# Patient Record
Sex: Male | Born: 1950 | Race: White | Hispanic: No | Marital: Single | Smoking: Former smoker
Health system: Southern US, Community
[De-identification: ages and names within clinical notes are randomized; demographics above are authoritative.]

## PROBLEM LIST (undated history)

## (undated) ENCOUNTER — Ambulatory Visit (HOSPITAL_COMMUNITY): Admission: EM | Source: Home / Self Care

## (undated) DIAGNOSIS — C801 Malignant (primary) neoplasm, unspecified: Secondary | ICD-10-CM

## (undated) DIAGNOSIS — E119 Type 2 diabetes mellitus without complications: Secondary | ICD-10-CM

## (undated) HISTORY — PX: OTHER SURGICAL HISTORY: SHX169

## (undated) HISTORY — PX: BLADDER SURGERY: SHX569

---

## 2004-11-13 ENCOUNTER — Ambulatory Visit: Payer: Self-pay | Admitting: Gastroenterology

## 2004-11-26 ENCOUNTER — Ambulatory Visit: Payer: Self-pay | Admitting: Urology

## 2006-09-06 ENCOUNTER — Ambulatory Visit: Payer: Self-pay | Admitting: Internal Medicine

## 2007-01-03 ENCOUNTER — Ambulatory Visit: Payer: Self-pay | Admitting: Internal Medicine

## 2007-09-28 ENCOUNTER — Ambulatory Visit: Payer: Self-pay | Admitting: Internal Medicine

## 2007-09-30 ENCOUNTER — Ambulatory Visit: Payer: Self-pay | Admitting: Internal Medicine

## 2007-10-30 ENCOUNTER — Ambulatory Visit: Payer: Self-pay | Admitting: Internal Medicine

## 2007-11-30 ENCOUNTER — Ambulatory Visit: Payer: Self-pay | Admitting: Internal Medicine

## 2011-04-08 ENCOUNTER — Ambulatory Visit: Payer: Self-pay | Admitting: Internal Medicine

## 2011-09-10 ENCOUNTER — Encounter: Payer: Self-pay | Admitting: Rheumatology

## 2014-05-15 ENCOUNTER — Ambulatory Visit: Payer: Self-pay | Admitting: Urology

## 2014-05-15 LAB — BASIC METABOLIC PANEL
Anion Gap: 6 — ABNORMAL LOW (ref 7–16)
BUN: 15 mg/dL (ref 7–18)
CO2: 28 mmol/L (ref 21–32)
Calcium, Total: 8.4 mg/dL — ABNORMAL LOW (ref 8.5–10.1)
Chloride: 101 mmol/L (ref 98–107)
Creatinine: 1.17 mg/dL (ref 0.60–1.30)
EGFR (African American): >60
Glucose: 268 mg/dL — ABNORMAL HIGH (ref 65–99)
OSMOLALITY: 280 (ref 275–301)
POTASSIUM: 4.1 mmol/L (ref 3.5–5.1)
Sodium: 135 mmol/L — ABNORMAL LOW (ref 136–145)

## 2014-05-21 ENCOUNTER — Ambulatory Visit: Payer: Self-pay | Admitting: Urology

## 2014-05-23 LAB — PATHOLOGY REPORT

## 2015-03-22 NOTE — Op Note (Signed)
PATIENT NAME:  Adam Chung, Adam Chung MR#:  [id number] DATE OF BIRTH:  10-14-1951  DATE OF PROCEDURE:  05/21/2014  PRINCIPAL DIAGNOSIS: Bladder tumor.   POSTOPERATIVE DIAGNOSIS: Bladder tumor.   PROCEDURE: Cystoscopy, bladder biopsy, left ureteroscopy, mitomycin bladder instillation, cystoscopy, left ureteral stent placement.   SURGEON: Edrick Oh, M.D.   ANESTHESIA: Laryngeal mask airway anesthesia.   INDICATIONS: The patient is a 64 year old white gentleman with a history of bladder cancer. 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 Due to concerns over possible ureteral extension, the decision was made to proceed with ureteroscopic examination at the same setting. He presents today for this purpose.   DESCRIPTION OF PROCEDURE: After informed consent was obtained, the patient was taken to the Operating Room and placed in the dorsal lithotomy position under laryngeal mask airway anesthesia. The patient was then prepped and draped in the usual standard fashion. The 22-French rigid cystoscope was introduced into the urethra under direct vision with no urethral abnormalities noted. Upon entering the prostatic fossa, moderate bilobar hypertrophy was noted with partial visual obstruction. Upon entering the bladder, the mucosa was inspected in its entirety with no gross mucosal lesions noted. An area of scar was noted on the left posterior bladder wall just behind the trigone region. At the level of the left trigone and ureteral orifice area, raised irregular early papillary-appearing mucosa was noted extending in patches onto the area of scar just behind the trigone and on both sides of the trigone at the level of the ureteral orifice it completely encircled the ureteral orifice proper. It extended only a short distance distal to the ureteral orifice. No  other irregular areas were noted on the right trigone region or other portions of the bladder. Cold cup biopsy forceps were then utilized through the cystoscope.  A biopsy was obtained from the lateral aspect of the trigone region at the level of the ureteral orifice. Care was taken to avoid any injury to the ureteral orifice with the biopsy. A good biopsy specimen was obtained. Minimal bleeding was encountered. The edges were lightly cauterized utilizing a Bugbee electrode. Care was taken to avoid injury to the ureteral orifice proper.   A guidewire was then inserted into the left ureteral orifice. An open-ended catheter was utilized to help facilitate navigation with the ureteral orifice intubated. The cystoscope was removed. A 6-French rigid ureteroscope was advanced into the urinary bladder. A second guidewire was utilized to help facilitate passage of the ureteroscope into the left ureteral orifice. There was no evidence of tumor inside of the orifice proper. The distal 3 to 4 cm of the ureter was inspected with no abnormalities appreciated. A slight area of narrowing was encountered. The scope was not able to be easily passed. The ureter could be visualized, however, beyond this area with no evidence of tumor or other significant abnormalities noted. The decision was made not to proceed with any further attempts at dilation. The initial plan was to try and avoid stent placement with the lack of tumor within the orifice and initial plan of possible transurethral resection of the trigone was therefore aborted. The Bugbee electrode was then utilized to fulgurate the abnormal-appearing lesions on the remainder of the posterior trigone approaching the ureteral orifice. The medial aspect of the trigone was also cauterized extensively utilizing a Bugbee electrode. Some distal tissue was also cauterized. A small amount of papillary tumor was still present  at the ureteral orifice proper. With the guidewire in place,  the Bugbee electrode was utilized to lightly fulgurate the tissue just to the edge of the ureteral orifice.   With the fulguration in close proximity to the opening, the decision was made to proceed with stent placement. The guidewire was backloaded over the cystoscope. A 6 French x 26 cm double-J ureteral stent was advanced over the guidewire into the upper pole collecting system under fluoroscopic guidance without difficulty. Adequate curl was noted within the renal pelvis as well as within the urinary bladder. Upon withdrawal of the guidewire, the bladder was drained. The cystoscope was removed.  Mitomycin 20 mg was reconstituted in 50 mL of sterile water. An 18-French red rubber catheter was inserted into the urinary bladder. The mitomycin was instilled into the urinary bladder. The catheter was then removed. Proper chemotherapy protocols were followed. The patient was then awakened from laryngeal mask airway anesthesia. He was taken to the recovery room in stable condition. There were no problems or complications. The patient tolerated the procedure well.   ____________________________ Denice Bors. Jacqlyn Larsen, MD bsc:cs D: 05/21/2014 14:09:54 ET T: 05/21/2014 19:31:49 ET JOB#: [id number]  cc: Denice Bors. Jacqlyn Larsen, MD, <Dictator> Denice Bors COPE MD ELECTRONICALLY SIGNED 05/22/2014 8:37

## 2018-07-28 ENCOUNTER — Emergency Department: Payer: Medicare Other

## 2018-07-28 ENCOUNTER — Emergency Department
Admission: EM | Admit: 2018-07-28 | Discharge: 2018-07-28 | Disposition: A | Discharge status: Home / Self Care | Payer: Medicare Other | Attending: Emergency Medicine | Admitting: Emergency Medicine

## 2018-07-28 DIAGNOSIS — M5412 Radiculopathy, cervical region: Secondary | ICD-10-CM

## 2018-07-28 DIAGNOSIS — Z87891 Personal history of nicotine dependence: Secondary | ICD-10-CM | POA: Insufficient documentation

## 2018-07-28 DIAGNOSIS — Z8551 Personal history of malignant neoplasm of bladder: Secondary | ICD-10-CM | POA: Insufficient documentation

## 2018-07-28 DIAGNOSIS — R0789 Other chest pain: Secondary | ICD-10-CM

## 2018-07-28 DIAGNOSIS — M549 Dorsalgia, unspecified: Secondary | ICD-10-CM | POA: Diagnosis present

## 2018-07-28 DIAGNOSIS — E119 Type 2 diabetes mellitus without complications: Secondary | ICD-10-CM | POA: Diagnosis not present

## 2018-07-28 HISTORY — DX: Malignant (primary) neoplasm, unspecified: C80.1

## 2018-07-28 HISTORY — DX: Type 2 diabetes mellitus without complications: E11.9

## 2018-07-28 LAB — CBC
HEMATOCRIT: 44.9 % (ref 40.0–52.0)
Hemoglobin: 15.5 g/dL (ref 13.0–18.0)
MCH: 30.9 pg (ref 26.0–34.0)
MCHC: 34.6 g/dL (ref 32.0–36.0)
MCV: 89.3 fL (ref 80.0–100.0)
PLATELETS: 162 10*3/uL (ref 150–440)
RBC: 5.02 MIL/uL (ref 4.40–5.90)
RDW: 13.6 % (ref 11.5–14.5)
WBC: 11.4 10*3/uL — AB (ref 3.8–10.6)

## 2018-07-28 LAB — COMPREHENSIVE METABOLIC PANEL
ALK PHOS: 68 U/L (ref 38–126)
ALT: 22 U/L (ref 0–44)
ANION GAP: 7 (ref 5–15)
AST: 23 U/L (ref 15–41)
Albumin: 4.1 g/dL (ref 3.5–5.0)
BILIRUBIN TOTAL: 1.1 mg/dL (ref 0.3–1.2)
BUN: 20 mg/dL (ref 8–23)
CALCIUM: 8.6 mg/dL — AB (ref 8.9–10.3)
CO2: 24 mmol/L (ref 22–32)
Chloride: 107 mmol/L (ref 98–111)
Creatinine, Ser: 0.92 mg/dL (ref 0.61–1.24)
GLUCOSE: 161 mg/dL — AB (ref 70–99)
POTASSIUM: 3.7 mmol/L (ref 3.5–5.1)
Sodium: 138 mmol/L (ref 135–145)
TOTAL PROTEIN: 6.8 g/dL (ref 6.5–8.1)

## 2018-07-28 LAB — TROPONIN I: Troponin I: <0.03 ng/mL (ref <0.03)

## 2018-07-28 LAB — LIPASE, BLOOD: LIPASE: 33 U/L (ref 11–51)

## 2018-07-28 MED ORDER — IOPAMIDOL (ISOVUE-370) INJECTION 76%
125.0000 mL | Freq: Once | INTRAVENOUS | Status: AC | PRN
  Administered 2018-07-28: 125 mL via INTRAVENOUS

## 2018-07-28 NOTE — Discharge Instructions (Signed)
Fortunately today your EKG, your blood work, and your CT scans were reassuring.  Please follow-up with your primary care physician as scheduled and return to the emergency department sooner for any concerns.  It was a pleasure to take care of you today, and thank you for coming to our emergency department.  If you have any questions or concerns before leaving please ask the nurse to grab me and I'm more than happy to go through your aftercare instructions again.  If you were prescribed any opioid pain medication today such as Norco, Vicodin, Percocet, morphine, hydrocodone, or oxycodone please make sure you do not drive when you are taking this medication as it can alter your ability to drive safely.  If you have any concerns once you are home that you are not improving or are in fact getting worse before you can make it to your follow-up appointment, please do not hesitate to call 911 and come back for further evaluation.  Darel Hong, MD  Results for orders placed or performed during the hospital encounter of 07/28/18  CBC  Result Value Ref Range   WBC 11.4 (H) 3.8 - 10.6 K/uL   RBC 5.02 4.40 - 5.90 MIL/uL   Hemoglobin 15.5 13.0 - 18.0 g/dL   HCT 44.9 40.0 - 52.0 %   MCV 89.3 80.0 - 100.0 fL   MCH 30.9 26.0 - 34.0 pg   MCHC 34.6 32.0 - 36.0 g/dL   RDW 13.6 11.5 - 14.5 %   Platelets 162 150 - 440 K/uL  Troponin I  Result Value Ref Range   Troponin I <0.03 <0.03 ng/mL  Comprehensive metabolic panel  Result Value Ref Range   Sodium 138 135 - 145 mmol/L   Potassium 3.7 3.5 - 5.1 mmol/L   Chloride 107 98 - 111 mmol/L   CO2 24 22 - 32 mmol/L   Glucose, Bld 161 (H) 70 - 99 mg/dL   BUN 20 8 - 23 mg/dL   Creatinine, Ser 0.92 0.61 - 1.24 mg/dL   Calcium 8.6 (L) 8.9 - 10.3 mg/dL   Total Protein 6.8 6.5 - 8.1 g/dL   Albumin 4.1 3.5 - 5.0 g/dL   AST 23 15 - 41 U/L   ALT 22 0 - 44 U/L   Alkaline Phosphatase 68 38 - 126 U/L   Total Bilirubin 1.1 0.3 - 1.2 mg/dL   GFR calc non Af Amer  >60 >60 mL/min   GFR calc Af Amer >60 >60 mL/min   Anion gap 7 5 - 15  Lipase, blood  Result Value Ref Range   Lipase 33 11 - 51 U/L   Dg Chest 2 View  Result Date: 07/28/2018 CLINICAL DATA:  67 year old male with chest pain and left arm numbness. EXAM: CHEST - 2 VIEW COMPARISON:  None. FINDINGS: The heart size and mediastinal contours are within normal limits. Both lungs are clear. The visualized skeletal structures are unremarkable. IMPRESSION: No active cardiopulmonary disease. Electronically Signed   By: Anner Crete M.D.   On: 07/28/2018 03:50   Ct Angio Neck W And/or Wo Contrast  Result Date: 07/28/2018 CLINICAL DATA:  67 y/o M; left arm numbness, back pain, nausea, emesis, dizziness, and intermittent chest pain for 1-2 days. EXAM: CT ANGIOGRAPHY NECK TECHNIQUE: Multidetector CT imaging of the neck was performed using the standard protocol during bolus administration of intravenous contrast. Multiplanar CT image reconstructions and MIPs were obtained to evaluate the vascular anatomy. Carotid stenosis measurements (when applicable) are obtained utilizing NASCET criteria,  using the distal internal carotid diameter as the denominator. CONTRAST:  136mL ISOVUE-370 IOPAMIDOL (ISOVUE-370) INJECTION 76% COMPARISON:  Concurrent CT angiogram of the chest, abdomen, and pelvis. FINDINGS: Aortic arch: Standard branching. Imaged portion shows no evidence of aneurysm or dissection. No significant stenosis of the major arch vessel origins. Right carotid system: No evidence of dissection, stenosis (50% or greater) or occlusion. Fibrofatty plaque of the carotid bifurcation with minimal less than 30% proximal ICA stenosis. Left carotid system: No evidence of dissection, stenosis (50% or greater) or occlusion. Vertebral arteries: Left dominant. No evidence of dissection, stenosis (50% or greater) or occlusion. Skeleton: Cervical spondylosis with prominent discogenic degenerative changes at the C5-6 level or  uncovertebral hypertrophy encroaches on the neural foramen bilaterally and there is mild bony canal stenosis. Other neck: 8 mm nodule in the right lobe of the thyroid. Chronic postsurgical/posttraumatic changes inferior to the right ear extending into parotid gland. Upper chest: Please refer to the concurrent CT angiogram of the chest. IMPRESSION: Patent carotid and vertebral arteries. No dissection, aneurysm, or hemodynamically significant stenosis utilizing NASCET criteria. Electronically Signed   By: Kristine Garbe M.D.   On: 07/28/2018 05:06   Ct Angio Chest/abd/pel For Dissection W And/or W/wo  Result Date: 07/28/2018 CLINICAL DATA:  67 year old male with left arm numbness and back pain. Concern for aortic dissection. EXAM: CT ANGIOGRAPHY CHEST, ABDOMEN AND PELVIS TECHNIQUE: Multidetector CT imaging through the chest, abdomen and pelvis was performed using the standard protocol during bolus administration of intravenous contrast. Multiplanar reconstructed images and MIPs were obtained and reviewed to evaluate the vascular anatomy. CONTRAST:  187mL ISOVUE-370 IOPAMIDOL (ISOVUE-370) INJECTION 76% COMPARISON:  None. FINDINGS: CTA CHEST FINDINGS Cardiovascular: There is no cardiomegaly or pericardial effusion. The thoracic aorta is unremarkable. The visualized origins of the great vessels of the aortic arch appear patent. The central pulmonary arteries are unremarkable as visualized. Mediastinum/Nodes: There is no hilar or mediastinal adenopathy. Esophagus and the thyroid gland are grossly unremarkable. No mediastinal fluid collection. Lungs/Pleura: The lungs are clear. There is no pleural effusion or pneumothorax. The central airways are patent. Musculoskeletal: No chest wall abnormality. No acute or significant osseous findings. Review of the MIP images confirms the above findings. CTA ABDOMEN AND PELVIS FINDINGS VASCULAR Aorta: Normal caliber aorta without aneurysm, dissection, vasculitis or  significant stenosis. Celiac: Patent without evidence of aneurysm, dissection, vasculitis or significant stenosis. SMA: Patent without evidence of aneurysm, dissection, vasculitis or significant stenosis. Renals: Both renal arteries are patent without evidence of aneurysm, dissection, vasculitis, fibromuscular dysplasia or significant stenosis. IMA: Patent without evidence of aneurysm, dissection, vasculitis or significant stenosis. Inflow: Patent without evidence of aneurysm, dissection, vasculitis or significant stenosis. Veins: The IVC appears unremarkable. The SMV, splenic vein, and main portal vein are patent. No portal venous gas. Review of the MIP images confirms the above findings. NON-VASCULAR No intra-abdominal free air or free fluid. Hepatobiliary: Subcentimeter right hepatic hypodense lesion is too small to characterize. No intrahepatic biliary duct dilatation. Cholecystectomy. Pancreas: Unremarkable. No pancreatic ductal dilatation or surrounding inflammatory changes. Spleen: Normal in size without focal abnormality. Adrenals/Urinary Tract: Adrenal glands, kidneys, and visualized ureters appear unremarkable. There is mild asymmetric and somewhat nodular thickening of the bladder dome (series 4 image 175). This is not well evaluated on CT and although may be related to bladder underdistention and infiltrated neoplasm is not excluded. Clinical correlation is recommended. Cystoscopy may provide better evaluation. Stomach/Bowel: There is scattered sigmoid diverticula without active inflammatory changes. There is loose stool within the  colon compatible with diarrheal state. Correlation with clinical exam and stool cultures recommended. There is no bowel obstruction or active inflammation. Normal appendix. Lymphatic: No adenopathy. Reproductive: The prostate and seminal vesicles are grossly unremarkable. Other: None Musculoskeletal: Degenerative changes of the spine. No acute osseous pathology. Right gluteus  medius intramuscular lipoma. Review of the MIP images confirms the above findings. IMPRESSION: 1. No aortic aneurysm or dissection. No CT evidence of central pulmonary artery embolus. 2. Diarrheal state. Correlation with clinical exam and stool cultures recommended. 3. Asymmetric thickened appearance of the bladder dome. Further evaluation with cystoscopy is recommended. Electronically Signed   By: Anner Crete M.D.   On: 07/28/2018 05:01

## 2018-07-28 NOTE — ED Provider Notes (Signed)
[facility] Emergency Department Provider Note  ____________________________________________   First MD Initiated Contact with Patient 07/28/18 [phone]     (approximate)  I have reviewed the triage vital signs and the nursing notes.   HISTORY  Chief Complaint Arm Numbness   HPI Adam Chung is a 67 y.o. male who comes to the emergency department with upper back pain, nausea, several episodes of vomiting, lightheadedness, intermittent sharp left chest pain, and left arm tingling for the past 2 days or so.  His symptoms came on gradually are mild to moderate have been intermittent.  The left arm numbness is slightly worse when ranging his left arm.  The chest pain is nonexertional and non-positional.  No fevers or chills.   The pain is not ripping or tearing it does not go straight to his back.   Past Medical History:  Diagnosis Date  . Cancer ([place])    bladder  . Diabetes mellitus without complication ([place])     There are no active problems to display for this patient.   Past Surgical History:  Procedure Laterality Date  . BLADDER SURGERY    . fatty tumor excision      Prior to Admission medications   Not on File    Allergies Apple and Penicillins  No family history on file.  Social History Social History   Tobacco Use  . Smoking status: Former Research scientist (life sciences)  . Smokeless tobacco: Never Used  Substance Use Topics  . Alcohol use: Not Currently  . Drug use: Not on file    Review of Systems Constitutional: No fever/chills Eyes: No visual changes. ENT: No sore throat. Cardiovascular: Positive for chest pain. Respiratory: Denies shortness of breath. Gastrointestinal: No abdominal pain.  Positive for nausea, positive for vomiting.  No diarrhea.  No constipation. Genitourinary: Negative for dysuria. Musculoskeletal: Negative for back pain. Skin: Negative for rash. Neurological: As of her left arm  numbness   ____________________________________________   PHYSICAL EXAM:  VITAL SIGNS: ED Triage Vitals [07/28/18 0316]  Enc Vitals Group     BP (!) 157/81     Pulse Rate (!) 102     Resp 16     Temp 98.2 F (36.8 C)     Temp Source Oral     SpO2 98 %     Weight      Height      Head Circumference      Peak Flow      Pain Score      Pain Loc      Pain Edu?      Excl. in Comfort?     Constitutional: Alert and oriented x4 appears somewhat uncomfortable though nontoxic in no diaphoresis Eyes: PERRL EOMI. pupils are midrange and brisk Head: Atraumatic. Nose: No congestion/rhinnorhea. Mouth/Throat: No trismus Neck: No stridor.  No bruits Cardiovascular: Normal rate, regular rhythm. Grossly normal heart sounds.  Good peripheral circulation. Respiratory: Normal respiratory effort.  No retractions. Lungs CTAB and moving good air Gastrointestinal: Soft nontender Musculoskeletal: No lower extremity edema   Neurologic:  Normal speech and language. No gross focal neurologic deficits are appreciated. No pronator drift 5 out of 5 grips biceps triceps bilaterally Skin:  Skin is warm, dry and intact. No rash noted. Psychiatric: Mood and affect are normal. Speech and behavior are normal.    ____________________________________________   DIFFERENTIAL includes but not limited to  Cervical artery dissection, vertebral artery dissection, aortic dissection, acute coronary syndrome, pulmonary embolism ____________________________________________   LABS (  all labs ordered are listed, but only abnormal results are displayed)  Labs Reviewed  CBC - Abnormal; Notable for the following components:      Result Value   WBC 11.4 (*)    All other components within normal limits  COMPREHENSIVE METABOLIC PANEL - Abnormal; Notable for the following components:   Glucose, Bld 161 (*)    Calcium 8.6 (*)    All other components within normal limits  TROPONIN I  LIPASE, BLOOD    Lab work  reviewed by me with slightly elevated white count which is nonspecific __________________________________________  EKG  ED ECG REPORT I, Darel Hong, the attending physician, personally viewed and interpreted this ECG.  Date: 07/29/2018 EKG Time:  Rate: 102 Rhythm: Sinus tachycardia QRS Axis: Leftward axis Intervals: normal ST/T Wave abnormalities: normal Narrative Interpretation: no evidence of acute ischemia  ____________________________________________  RADIOLOGY  CT angiogram of the neck chest abdomen pelvis reviewed by me with no dissection or aortic disease but is concerning for diarrheal state ____________________________________________   PROCEDURES  Procedure(s) performed: no  Procedures  Critical Care performed: no  ____________________________________________   INITIAL IMPRESSION / ASSESSMENT AND PLAN / ED COURSE  Pertinent labs & imaging results that were available during my care of the patient were reviewed by me and considered in my medical decision making (see chart for details).   As part of my medical decision making, I reviewed the following data within the [facility] History obtained from family if available, nursing notes, old chart and ekg, as well as notes from prior ED visits.  The patient comes in with a constellation of symptoms including chest pain, left arm numbness, nausea and vomiting.  He has a history of hypertension with a leftward axis on his EKG.  This all could be nonspecific however is also concerning for an aortic etiology.  CT angiogram of the neck chest abdomen pelvis is pending.  He declines pain medication at this time.  Fortunately the patient's CT scan is reassuring.  It is concerning for diarrheal illness however the patient himself denies diarrhea.  It is possible he has an early enteritis coming on.  Regardless he is well hydrated with no indication for antibiotics.  He is given reassurance and strict return  precautions.  He verbalizes understanding and agreement with the plan.      ____________________________________________   FINAL CLINICAL IMPRESSION(S) / ED DIAGNOSES  Final diagnoses:  Atypical chest pain  Cervical radiculopathy      NEW MEDICATIONS STARTED DURING THIS VISIT:  There are no discharge medications for this patient.    Note:  This document was prepared using Dragon voice recognition software and may include unintentional dictation errors.     Darel Hong, MD 07/29/18 2101

## 2018-07-28 NOTE — ED Notes (Signed)
Pt to and from xray at this time.

## 2018-07-28 NOTE — ED Triage Notes (Signed)
Patient c/o left arm numbness, back pain, nausea, emesis, dizziness, and intermittent chest pain X 1-2 days. Patient AO X 4.

## 2021-07-11 ENCOUNTER — Emergency Department
Admission: EM | Admit: 2021-07-11 | Discharge: 2021-07-11 | Disposition: A | Discharge status: Home / Self Care | Payer: Medicare Other | Attending: Emergency Medicine | Admitting: Emergency Medicine

## 2021-07-11 ENCOUNTER — Other Ambulatory Visit: Payer: Self-pay

## 2021-07-11 DIAGNOSIS — Z87891 Personal history of nicotine dependence: Secondary | ICD-10-CM | POA: Diagnosis not present

## 2021-07-11 DIAGNOSIS — R7989 Other specified abnormal findings of blood chemistry: Secondary | ICD-10-CM | POA: Insufficient documentation

## 2021-07-11 DIAGNOSIS — E119 Type 2 diabetes mellitus without complications: Secondary | ICD-10-CM | POA: Diagnosis not present

## 2021-07-11 DIAGNOSIS — Z7984 Long term (current) use of oral hypoglycemic drugs: Secondary | ICD-10-CM | POA: Diagnosis not present

## 2021-07-11 DIAGNOSIS — Z8551 Personal history of malignant neoplasm of bladder: Secondary | ICD-10-CM | POA: Insufficient documentation

## 2021-07-11 LAB — COMPREHENSIVE METABOLIC PANEL
ALT: 15 U/L (ref 0–44)
AST: 17 U/L (ref 15–41)
Albumin: 4.3 g/dL (ref 3.5–5.0)
Alkaline Phosphatase: 70 U/L (ref 38–126)
Anion gap: 13 (ref 5–15)
BUN: 40 mg/dL — ABNORMAL HIGH (ref 8–23)
CO2: 22 mmol/L (ref 22–32)
Calcium: 9.4 mg/dL (ref 8.9–10.3)
Chloride: 105 mmol/L (ref 98–111)
Creatinine, Ser: 2.31 mg/dL — ABNORMAL HIGH (ref 0.61–1.24)
GFR, Estimated: 30 mL/min — ABNORMAL LOW (ref >60)
Glucose, Bld: 212 mg/dL — ABNORMAL HIGH (ref 70–99)
Potassium: 4.4 mmol/L (ref 3.5–5.1)
Sodium: 140 mmol/L (ref 135–145)
Total Bilirubin: 1.2 mg/dL (ref 0.3–1.2)
Total Protein: 7.6 g/dL (ref 6.5–8.1)

## 2021-07-11 LAB — CBC
HCT: 31.9 % — ABNORMAL LOW (ref 39.0–52.0)
Hemoglobin: 11.1 g/dL — ABNORMAL LOW (ref 13.0–17.0)
MCH: 30.1 pg (ref 26.0–34.0)
MCHC: 34.8 g/dL (ref 30.0–36.0)
MCV: 86.4 fL (ref 80.0–100.0)
Platelets: 212 10*3/uL (ref 150–400)
RBC: 3.69 MIL/uL — ABNORMAL LOW (ref 4.22–5.81)
RDW: 12.8 % (ref 11.5–15.5)
WBC: 8.3 10*3/uL (ref 4.0–10.5)
nRBC: 0 % (ref 0.0–0.2)

## 2021-07-11 LAB — URINALYSIS, COMPLETE (UACMP) WITH MICROSCOPIC
Bacteria, UA: NONE SEEN
Bilirubin Urine: NEGATIVE
Glucose, UA: 50 mg/dL — AB
Ketones, ur: NEGATIVE mg/dL
Nitrite: NEGATIVE
Protein, ur: 30 mg/dL — AB
Specific Gravity, Urine: 1.006 (ref 1.005–1.030)
Squamous Epithelial / HPF: NONE SEEN (ref 0–5)
pH: 6 (ref 5.0–8.0)

## 2021-07-11 NOTE — ED Notes (Signed)
ED Provider at bedside. 

## 2021-07-11 NOTE — ED Provider Notes (Signed)
[facility]  ____________________________________________   Event Date/Time   First MD Initiated Contact with Patient 07/11/21 1003     (approximate)  I have reviewed the triage vital signs and the nursing notes.   HISTORY  Chief Complaint Abnormal Lab    HPI Adam Chung is a 70 y.o. male past medical history of diabetes and remote history of bladder cancer who presents with an elevated creatinine.  Patient had routine blood work drawn by his primary care physician that showed a creatinine of 2.4 so he was told to come to the emergency department.  The patient is asymptomatic he denies chest pain shortness of breath nausea vomiting, fevers chills, dysuria.  He has noticed increased urinary frequency especially at night as well as some urinary incontinence over the last month.  However he is able to urinate and feels like he empties his bladder when he does.  Patient last had a creatinine 6 months ago which was 1.4.  Any recent changes to his medications or any antibiotic use recently.         Past Medical History:  Diagnosis Date   Cancer [facility])    bladder   Diabetes mellitus without complication ([place])     There are no problems to display for this patient.   Past Surgical History:  Procedure Laterality Date   BLADDER SURGERY     fatty tumor excision      Prior to Admission medications   Medication Sig Start Date End Date Taking? Authorizing Provider  amLODipine (NORVASC) 5 MG tablet Take 5 mg by mouth daily.   Yes [provider]  Cholecalciferol 50 MCG (2000 UT) TABS Take 4,000 Units by mouth daily.   Yes [provider]  glimepiride (AMARYL) 4 MG tablet Take 4 mg by mouth daily with breakfast.   Yes [provider]  olmesartan (BENICAR) 20 MG tablet Take 20 mg by mouth daily.   Yes [provider]  tamsulosin (FLOMAX) 0.4 MG CAPS capsule Take 0.4 mg by mouth daily.   Yes [provider]     Allergies Apple and Penicillins  History reviewed. No pertinent family history.  Social History Social History   Tobacco Use   Smoking status: Former   Smokeless tobacco: Never  Substance Use Topics   Alcohol use: Not Currently    Review of Systems   Review of Systems  Constitutional:  Negative for chills, fatigue and fever.  Respiratory:  Negative for shortness of breath.   Cardiovascular:  Negative for chest pain.  Gastrointestinal:  Negative for abdominal pain, nausea and vomiting.  Genitourinary:  Positive for frequency. Negative for difficulty urinating, dysuria and hematuria.  Neurological:  Negative for headaches.  All other systems reviewed and are negative.  Physical Exam Updated Vital Signs BP (!) 160/85 (BP Location: Right Arm)   Pulse 77   Temp 98.5 F (36.9 C) (Oral)   Resp 17   Ht 5\' 11"  (1.803 m)   Wt 82.6 kg   SpO2 98%   BMI 25.38 kg/m   Physical Exam Vitals and nursing note reviewed.  Constitutional:      General: He is not in acute distress.    Appearance: Normal appearance.  HENT:     Head: Normocephalic and atraumatic.  Eyes:     General: No scleral icterus.    Conjunctiva/sclera: Conjunctivae normal.  Pulmonary:     Effort: Pulmonary effort is normal. No respiratory distress.     Breath sounds: Normal  breath sounds. No wheezing.  Musculoskeletal:        General: No deformity or signs of injury.     Cervical back: Normal range of motion.  Skin:    Coloration: Skin is not jaundiced or pale.  Neurological:     General: No focal deficit present.     Mental Status: He is alert and oriented to person, place, and time. Mental status is at baseline.  Psychiatric:        Mood and Affect: Mood normal.        Behavior: Behavior normal.     LABS (all labs ordered are listed, but only abnormal results are displayed)  Labs Reviewed  CBC - Abnormal; Notable for the following components:      Result Value   RBC 3.69 (*)    Hemoglobin  11.1 (*)    HCT 31.9 (*)    All other components within normal limits  COMPREHENSIVE METABOLIC PANEL - Abnormal; Notable for the following components:   Glucose, Bld 212 (*)    BUN 40 (*)    Creatinine, Ser 2.31 (*)    GFR, Estimated 30 (*)    All other components within normal limits  URINALYSIS, COMPLETE (UACMP) WITH MICROSCOPIC - Abnormal; Notable for the following components:   Color, Urine STRAW (*)    APPearance CLEAR (*)    Glucose, UA 50 (*)    Hgb urine dipstick SMALL (*)    Protein, ur 30 (*)    Leukocytes,Ua SMALL (*)    All other components within normal limits   ____________________________________________  EKG  N/a ____________________________________________  RADIOLOGY Almeta Monas, personally viewed and evaluated these images (plain radiographs) as part of my medical decision making, as well as reviewing the written report by the radiologist.  ED MD interpretation: Bladder scan, postvoid residual 182cc    ____________________________________________   PROCEDURES  Procedure(s) performed (including Critical Care):  Procedures   ____________________________________________   INITIAL IMPRESSION / ASSESSMENT AND PLAN / ED COURSE     Patient is a 70 year old male with history of diabetes who presents with an incidentally increased creatinine on outpatient labs.  The patient has some urinary frequency and incontinence over the last several months but otherwise is asymptomatic.  On chart review looks like his last creatinine was 1.46 months ago.  Patient has no signs or symptoms to suggest prerenal etiology.  We obtained a postvoid residual which is 180 to rule out obstruction as the etiology.  His first creatinine on 8-11 was 2.3 and it is again 2.3 today so it is not rising.  His potassium is normal he is not short of breath or any any other symptoms of volume overload.  I discussed with nephrologist Dr. Theador Hawthorne who recommends that he stop his ARB but  feels that he is okay to follow-up as an outpatient.  I discussed with the patient that he needs to call nephrology on Monday to schedule an appointment and likely have repeat labs.      ____________________________________________   FINAL CLINICAL IMPRESSION(S) / ED DIAGNOSES  Final diagnoses:  Elevated serum creatinine     ED Discharge Orders     None        Note:  This document was prepared using Dragon voice recognition software and may include unintentional dictation errors.    Rada Hay, MD 07/11/21 [phone]

## 2021-07-11 NOTE — Discharge Instructions (Addendum)
Please call Dr. Toya Smothers office on Monday to schedule an appointment.  Please stop taking your olmesartan and avoid any NSAIDs including ibuprofen, Motrin and etc.

## 2021-07-11 NOTE — ED Triage Notes (Signed)
Pt comes pov after being sent by Dr Sabra Heck for decreased kidney function on outpatient labwork. Pt denies any symptoms except an increase in incontinence.

## 2021-07-11 NOTE — ED Notes (Signed)
Bladder scan 180 cc pvr

## 2021-07-16 ENCOUNTER — Other Ambulatory Visit: Payer: Self-pay | Admitting: Internal Medicine

## 2021-07-16 DIAGNOSIS — N170 Acute kidney failure with tubular necrosis: Secondary | ICD-10-CM

## 2021-08-06 ENCOUNTER — Ambulatory Visit
Admission: RE | Admit: 2021-08-06 | Discharge: 2021-08-06 | Disposition: A | Discharge status: Home / Self Care | Payer: Medicare Other | Source: Ambulatory Visit | Attending: Internal Medicine | Admitting: Internal Medicine

## 2021-08-06 ENCOUNTER — Other Ambulatory Visit: Payer: Self-pay

## 2021-08-06 DIAGNOSIS — N170 Acute kidney failure with tubular necrosis: Secondary | ICD-10-CM

## 2021-08-06 DIAGNOSIS — N179 Acute kidney failure, unspecified: Secondary | ICD-10-CM | POA: Diagnosis not present

## 2021-08-07 ENCOUNTER — Inpatient Hospital Stay
Admission: EM | Admit: 2021-08-07 | Discharge: 2021-08-11 | DRG: 669 | Disposition: A | Discharge status: Home / Self Care | Payer: Medicare Other | Attending: Internal Medicine | Admitting: Internal Medicine

## 2021-08-07 ENCOUNTER — Other Ambulatory Visit: Payer: Self-pay

## 2021-08-07 ENCOUNTER — Telehealth: Payer: Self-pay | Admitting: *Deleted

## 2021-08-07 DIAGNOSIS — Z87891 Personal history of nicotine dependence: Secondary | ICD-10-CM

## 2021-08-07 DIAGNOSIS — R338 Other retention of urine: Secondary | ICD-10-CM

## 2021-08-07 DIAGNOSIS — N179 Acute kidney failure, unspecified: Secondary | ICD-10-CM | POA: Diagnosis not present

## 2021-08-07 DIAGNOSIS — N3289 Other specified disorders of bladder: Secondary | ICD-10-CM | POA: Diagnosis present

## 2021-08-07 DIAGNOSIS — N1831 Chronic kidney disease, stage 3a: Secondary | ICD-10-CM | POA: Diagnosis present

## 2021-08-07 DIAGNOSIS — Z20822 Contact with and (suspected) exposure to covid-19: Secondary | ICD-10-CM | POA: Diagnosis present

## 2021-08-07 DIAGNOSIS — N390 Urinary tract infection, site not specified: Secondary | ICD-10-CM | POA: Diagnosis present

## 2021-08-07 DIAGNOSIS — Z79899 Other long term (current) drug therapy: Secondary | ICD-10-CM

## 2021-08-07 DIAGNOSIS — Z7984 Long term (current) use of oral hypoglycemic drugs: Secondary | ICD-10-CM

## 2021-08-07 DIAGNOSIS — N35912 Unspecified bulbous urethral stricture, male: Secondary | ICD-10-CM | POA: Diagnosis present

## 2021-08-07 DIAGNOSIS — R32 Unspecified urinary incontinence: Secondary | ICD-10-CM | POA: Diagnosis present

## 2021-08-07 DIAGNOSIS — N401 Enlarged prostate with lower urinary tract symptoms: Secondary | ICD-10-CM | POA: Diagnosis present

## 2021-08-07 DIAGNOSIS — Z8551 Personal history of malignant neoplasm of bladder: Secondary | ICD-10-CM

## 2021-08-07 DIAGNOSIS — N412 Abscess of prostate: Secondary | ICD-10-CM | POA: Diagnosis present

## 2021-08-07 DIAGNOSIS — R339 Retention of urine, unspecified: Secondary | ICD-10-CM

## 2021-08-07 DIAGNOSIS — N35819 Other urethral stricture, male, unspecified site: Secondary | ICD-10-CM

## 2021-08-07 DIAGNOSIS — Z794 Long term (current) use of insulin: Secondary | ICD-10-CM

## 2021-08-07 DIAGNOSIS — N136 Pyonephrosis: Secondary | ICD-10-CM | POA: Diagnosis present

## 2021-08-07 DIAGNOSIS — N1339 Other hydronephrosis: Secondary | ICD-10-CM | POA: Diagnosis not present

## 2021-08-07 DIAGNOSIS — Z880 Allergy status to penicillin: Secondary | ICD-10-CM

## 2021-08-07 DIAGNOSIS — E119 Type 2 diabetes mellitus without complications: Secondary | ICD-10-CM

## 2021-08-07 DIAGNOSIS — R3129 Other microscopic hematuria: Secondary | ICD-10-CM | POA: Diagnosis present

## 2021-08-07 DIAGNOSIS — N183 Chronic kidney disease, stage 3 unspecified: Secondary | ICD-10-CM

## 2021-08-07 DIAGNOSIS — E1122 Type 2 diabetes mellitus with diabetic chronic kidney disease: Secondary | ICD-10-CM

## 2021-08-07 DIAGNOSIS — Z91018 Allergy to other foods: Secondary | ICD-10-CM

## 2021-08-07 DIAGNOSIS — I10 Essential (primary) hypertension: Secondary | ICD-10-CM | POA: Diagnosis present

## 2021-08-07 DIAGNOSIS — N1330 Unspecified hydronephrosis: Secondary | ICD-10-CM

## 2021-08-07 DIAGNOSIS — I129 Hypertensive chronic kidney disease with stage 1 through stage 4 chronic kidney disease, or unspecified chronic kidney disease: Secondary | ICD-10-CM | POA: Diagnosis present

## 2021-08-07 DIAGNOSIS — Z87440 Personal history of urinary (tract) infections: Secondary | ICD-10-CM

## 2021-08-07 DIAGNOSIS — L0291 Cutaneous abscess, unspecified: Secondary | ICD-10-CM | POA: Diagnosis present

## 2021-08-07 LAB — COMPREHENSIVE METABOLIC PANEL
ALT: 12 U/L (ref 0–44)
AST: 13 U/L — ABNORMAL LOW (ref 15–41)
Albumin: 3.7 g/dL (ref 3.5–5.0)
Alkaline Phosphatase: 59 U/L (ref 38–126)
Anion gap: 10 (ref 5–15)
BUN: 48 mg/dL — ABNORMAL HIGH (ref 8–23)
CO2: 24 mmol/L (ref 22–32)
Calcium: 9.5 mg/dL (ref 8.9–10.3)
Chloride: 102 mmol/L (ref 98–111)
Creatinine, Ser: 2.64 mg/dL — ABNORMAL HIGH (ref 0.61–1.24)
GFR, Estimated: 25 mL/min — ABNORMAL LOW (ref >60)
Glucose, Bld: 179 mg/dL — ABNORMAL HIGH (ref 70–99)
Potassium: 4.2 mmol/L (ref 3.5–5.1)
Sodium: 136 mmol/L (ref 135–145)
Total Bilirubin: 0.8 mg/dL (ref 0.3–1.2)
Total Protein: 7.3 g/dL (ref 6.5–8.1)

## 2021-08-07 LAB — CBC
HCT: 27.4 % — ABNORMAL LOW (ref 39.0–52.0)
Hemoglobin: 9.7 g/dL — ABNORMAL LOW (ref 13.0–17.0)
MCH: 30.6 pg (ref 26.0–34.0)
MCHC: 35.4 g/dL (ref 30.0–36.0)
MCV: 86.4 fL (ref 80.0–100.0)
Platelets: 219 10*3/uL (ref 150–400)
RBC: 3.17 MIL/uL — ABNORMAL LOW (ref 4.22–5.81)
RDW: 12.5 % (ref 11.5–15.5)
WBC: 10.7 10*3/uL — ABNORMAL HIGH (ref 4.0–10.5)
nRBC: 0 % (ref 0.0–0.2)

## 2021-08-07 LAB — URINALYSIS, COMPLETE (UACMP) WITH MICROSCOPIC
Bilirubin Urine: NEGATIVE
Glucose, UA: NEGATIVE mg/dL
Ketones, ur: NEGATIVE mg/dL
Nitrite: NEGATIVE
Protein, ur: 100 mg/dL — AB
Specific Gravity, Urine: 1.01 (ref 1.005–1.030)
Squamous Epithelial / HPF: NONE SEEN (ref 0–5)
WBC, UA: >50 WBC/hpf (ref 0–5)
pH: 6 (ref 5.0–8.0)

## 2021-08-07 LAB — RESP PANEL BY RT-PCR (FLU A&B, COVID) ARPGX2
Influenza A by PCR: NEGATIVE
Influenza B by PCR: NEGATIVE
SARS Coronavirus 2 by RT PCR: NEGATIVE

## 2021-08-07 LAB — GLUCOSE, CAPILLARY
Glucose-Capillary: 125 mg/dL — ABNORMAL HIGH (ref 70–99)
Glucose-Capillary: 100 mg/dL — ABNORMAL HIGH (ref 70–99)

## 2021-08-07 LAB — HEMOGLOBIN A1C
Hgb A1c MFr Bld: 8.3 % — ABNORMAL HIGH (ref 4.8–5.6)
Mean Plasma Glucose: 191.51 mg/dL

## 2021-08-07 LAB — LIPASE, BLOOD: Lipase: 47 U/L (ref 11–51)

## 2021-08-07 MED ORDER — MORPHINE SULFATE (PF) 2 MG/ML IV SOLN: 0.5000 mg | INTRAVENOUS | Status: DC | PRN

## 2021-08-07 MED ORDER — SODIUM CHLORIDE 0.9 % IV SOLN
INTRAVENOUS | Status: DC
Start: 2021-08-07 — End: 2021-08-08

## 2021-08-07 MED ORDER — MORPHINE SULFATE (PF) 2 MG/ML IV SOLN
2.0000 mg | INTRAVENOUS | Status: DC | PRN
  Administered 2021-08-07: 2 mg via INTRAVENOUS
  Filled 2021-08-07: qty 1

## 2021-08-07 MED ORDER — ACETAMINOPHEN 650 MG RE SUPP
650.0000 mg | Freq: Four times a day (QID) | RECTAL | Status: AC | PRN
  Administered 2021-08-08: 650 mg via RECTAL
  Filled 2021-08-07: qty 1

## 2021-08-07 MED ORDER — ONDANSETRON HCL 4 MG/2ML IJ SOLN
4.0000 mg | Freq: Four times a day (QID) | INTRAMUSCULAR | Status: DC | PRN
Start: 2021-08-07 — End: 2021-08-11
  Administered 2021-08-08: 4 mg via INTRAVENOUS
  Filled 2021-08-07: qty 2

## 2021-08-07 MED ORDER — INSULIN ASPART 100 UNIT/ML IJ SOLN
0.0000 [IU] | Freq: Three times a day (TID) | INTRAMUSCULAR | Status: DC
  Administered 2021-08-08: 2 [IU] via SUBCUTANEOUS
  Administered 2021-08-09: 1 [IU] via SUBCUTANEOUS
  Administered 2021-08-09: 9 [IU] via SUBCUTANEOUS
  Administered 2021-08-09: 1 [IU] via SUBCUTANEOUS
  Administered 2021-08-10 (×2): 3 [IU] via SUBCUTANEOUS
  Administered 2021-08-10: 2 [IU] via SUBCUTANEOUS
  Administered 2021-08-11: 3 [IU] via SUBCUTANEOUS
  Filled 2021-08-07 (×8): qty 1

## 2021-08-07 MED ORDER — HEPARIN SODIUM (PORCINE) 5000 UNIT/ML IJ SOLN
5000.0000 [IU] | Freq: Three times a day (TID) | INTRAMUSCULAR | Status: AC
  Administered 2021-08-08 – 2021-08-10 (×4): 5000 [IU] via SUBCUTANEOUS
  Filled 2021-08-07 (×5): qty 1

## 2021-08-07 MED ORDER — DILTIAZEM HCL 25 MG/5ML IV SOLN
5.0000 mg | INTRAVENOUS | Status: DC | PRN
Start: 2021-08-07 — End: 2021-08-11
  Filled 2021-08-07: qty 5

## 2021-08-07 MED ORDER — AMLODIPINE BESYLATE 5 MG PO TABS: 5.0000 mg | ORAL_TABLET | Freq: Every day | ORAL | Status: DC

## 2021-08-07 MED ORDER — TAMSULOSIN HCL 0.4 MG PO CAPS
0.4000 mg | ORAL_CAPSULE | Freq: Every day | ORAL | Status: DC
  Administered 2021-08-07 – 2021-08-11 (×5): 0.4 mg via ORAL
  Filled 2021-08-07 (×5): qty 1

## 2021-08-07 MED ORDER — LEVOFLOXACIN IN D5W 500 MG/100ML IV SOLN
500.0000 mg | Freq: Once | INTRAVENOUS | Status: AC
  Administered 2021-08-07: 500 mg via INTRAVENOUS
  Filled 2021-08-07: qty 100

## 2021-08-07 MED ORDER — ACETAMINOPHEN 325 MG PO TABS
650.0000 mg | ORAL_TABLET | Freq: Four times a day (QID) | ORAL | Status: AC | PRN
  Administered 2021-08-08 – 2021-08-10 (×2): 650 mg via ORAL
  Filled 2021-08-07 (×3): qty 2

## 2021-08-07 MED ORDER — INSULIN ASPART 100 UNIT/ML IJ SOLN
0.0000 [IU] | Freq: Every day | INTRAMUSCULAR | Status: DC
  Administered 2021-08-09: 5 [IU] via SUBCUTANEOUS
  Administered 2021-08-10: 2 [IU] via SUBCUTANEOUS
  Filled 2021-08-07 (×2): qty 1

## 2021-08-07 MED ORDER — AMLODIPINE BESYLATE 5 MG PO TABS
5.0000 mg | ORAL_TABLET | Freq: Every day | ORAL | Status: DC
  Administered 2021-08-07 – 2021-08-11 (×5): 5 mg via ORAL
  Filled 2021-08-07 (×5): qty 1

## 2021-08-07 MED ORDER — LEVOFLOXACIN IN D5W 250 MG/50ML IV SOLN
250.0000 mg | INTRAVENOUS | Status: DC
  Administered 2021-08-08: 250 mg via INTRAVENOUS
  Filled 2021-08-07 (×2): qty 50

## 2021-08-07 MED ORDER — ONDANSETRON HCL 4 MG PO TABS: 4.0000 mg | ORAL_TABLET | Freq: Four times a day (QID) | ORAL | Status: DC | PRN

## 2021-08-07 NOTE — H&P (Signed)
History and Physical   SIPRIANO FENDLEY QZR:[id number] DOB: [birth year] DOA: 08/07/2021  PCP: Rusty Aus, MD  Patient coming from: Home  I have personally briefly reviewed patient's old medical records in [place].  Chief Concern: Abnormal imaging from primary care  HPI: Adam Chung is a 70 y.o. male with medical history significant for hypertension, CKD 3, non-insulin-dependent diabetes mellitus, presents to the emergency department for chief concerns of abnormal imaging at his primary care clinic.  He reports that his last PO intake was 08/06/21.   He states that the imaging was ordered due to [facility] on lab from outpatient clinic.  He endorses incontinence and leakage - started three months ago and has progressively worsened. Leakaged started  He endorses dysuria - which started while in the hospital.   He denies fever, headache, vision changes, dysphagia, shortness of breath, chest pain, abdominal pain, diarrhea. He denies syncope or lost of consciousness.   He reports the pain is 7-8/10 burning sensation that comes and goes.   Social history: He lives by himself. He denies tobacco, etoh, recreational drug use. He is retired and was formerly a Sports coach.   Vaccination history: he is not vaccianted for covid 19.   ROS: Constitutional: no weight change, no fever ENT/Mouth: no sore throat, no rhinorrhea Eyes: no eye pain, no vision changes Cardiovascular: no chest pain, no dyspnea,  no edema, no palpitations Respiratory: no cough, no sputum, no wheezing Gastrointestinal: no nausea, no vomiting, no diarrhea, no constipation Genitourinary: no urinary incontinence, no dysuria, no hematuria Musculoskeletal: no arthralgias, no myalgias Skin: no skin lesions, no pruritus, Neuro: + weakness, no loss of consciousness, no syncope Psych: no anxiety, no depression, + decrease appetite Heme/Lymph: no bruising, no bleeding  ED Course: Discussed with emergency  medicine provider, patient requiring hospitalization for chief concerns of urinary retention moderate to severe bilateral hydronephrosis likely secondary to bladder pathology.    CT abdomen pelvis with contrast additionally read bladder is moderately distended with diffuse wall thickening.  Fluid collection in the area could be associated with dilated prostate urethra, disruption of the proximal ureter or even abscess.  Vitals in the emergency department showed temperature of 98, respiration rate of 18, heart rate of 98, blood pressure 147/85, SPO2 of 98% on room air.  Labs in the emergency department was remarkable for sodium 136, potassium 4.2, chloride 102, bicarb 24, BUN 48, serum creatinine of 2.64, nonfasting blood glucose 179, WBC 10.7, hemoglobin 9.7, platelets 219.  COVID/influenza A/influenza B were negative.  UA was positive for leukocytes.  Nitrates were negative.  ED provider ordered levofloxacin IV.  Assessment/Plan  Principal Problem:   AKI (acute kidney injury) ([place]) Active Problems:   Abscess   Essential hypertension   Diabetes mellitus type 2, noninsulin dependent (HCC)   CKD (chronic kidney disease) stage 3, GFR 30-59 ml/min ([facility])   Acute lower UTI   Acute urinary retention   # Urinary retention - CT evidence of urethral stricture - Flomax - Status post Foley catheter placement by Dr. Abner Greenspan, we appreciate further recommendations  # Acute kidney injury-I suspect this is prerenal in setting of urinary retention with bilateral hydronephrosis - Sodium chloride infusion at 125 mL/h, 1 day ordered - Urine culture is in process - Treat as above - BMP in the a.m.  # History of recurrent urinary tract infection - He reports that he finished ciprofloxacine about 5 days ago.  - Status post levofloxacin 500 mg IV per EDP, we  will continue this as patient is allergic to penicillin  # History of hypertension-resumed amlodipine 5 mg daily - Holding home olmesartan 20 mg  daily due to acute kidney injury - Diltiazem 5 mg IV every 2 hours as needed for SBP greater than 170, 4 doses ordered  # Non-insulin-dependent diabetes mellitus - Holding home sulfonylureas at this time - Insulin SSI with at bedtime coverage  Chart reviewed.   DVT prophylaxis: Heparin 5000 units subcutaneous every 8 hours starting 08/08/2021 at 2200 Code Status: Full code Diet: Heart healthy/carb modified Family Communication: Updated brother at bedside Disposition Plan: Pending clinical course Consults called: Urology Admission status: MedSurg, observation, telemetry  Past Medical History:  Diagnosis Date   Cancer ([place])    bladder   Diabetes mellitus without complication ([place])    Past Surgical History:  Procedure Laterality Date   BLADDER SURGERY     fatty tumor excision     Social History:  reports that he has quit smoking. He has never used smokeless tobacco. He reports that he does not currently use alcohol. No history on file for drug use.  Allergies  Allergen Reactions   Apple Anaphylaxis   Penicillins Swelling   No family history on file. Family history: Family history reviewed and not pertinent  Prior to Admission medications   Medication Sig Start Date End Date Taking? Authorizing Provider  amLODipine (NORVASC) 5 MG tablet Take 5 mg by mouth daily.    [provider]  Cholecalciferol 50 MCG (2000 UT) TABS Take 4,000 Units by mouth daily.    [provider]  glimepiride (AMARYL) 4 MG tablet Take 4 mg by mouth daily with breakfast.    [provider]  olmesartan (BENICAR) 20 MG tablet Take 20 mg by mouth daily.    [provider]  tamsulosin (FLOMAX) 0.4 MG CAPS capsule Take 0.4 mg by mouth daily.    [provider]   Physical Exam: Vitals:   08/07/21 1059 08/07/21 1617 08/07/21 1700 08/07/21 1948  BP: (!) 147/85 (!) 181/92  (!) 172/81  Pulse: 98 (!) 101  (!) 106  Resp: 18 18  16   Temp: 98 F (36.7 C) 98.6 F (37  C)  99.3 F (37.4 C)  TempSrc:  Oral  Oral  SpO2: 98% 100%  97%  Weight:   82 kg   Height:   5\' 11"  (1.803 m)    Constitutional: appears age-appropriate, NAD, calm, comfortable Eyes: PERRL, lids and conjunctivae normal ENMT: Mucous membranes are moist. Posterior pharynx clear of any exudate or lesions. Age-appropriate dentition. Hearing appropriate Neck: normal, supple, no masses, no thyromegaly Respiratory: clear to auscultation bilaterally, no wheezing, no crackles. Normal respiratory effort. No accessory muscle use.  Cardiovascular: Regular rate and rhythm, no murmurs / rubs / gallops. No extremity edema. 2+ pedal pulses. No carotid bruits.  Abdomen: no tenderness, no masses palpated, no hepatosplenomegaly. Bowel sounds positive.  Musculoskeletal: no clubbing / cyanosis. No joint deformity upper and lower extremities. Good ROM, no contractures, no atrophy. Normal muscle tone.  Skin: no rashes, lesions, ulcers. No induration Neurologic: Sensation intact. Strength 5/5 in all 4.  Psychiatric: Normal judgment and insight. Alert and oriented x 3. Normal mood.   EKG:Not indicated at this time  Imaging on Admission: I personally reviewed and I agree with radiologist reading as below.  CT ABDOMEN PELVIS WO CONTRAST  Addendum Date: 08/07/2021   ADDENDUM REPORT: 08/07/2021 08:28 ADDENDUM: These results were called by telephone at the time of interpretation on  08/07/2021 at 8:20 am to provider Dr. Ramonita Lab, who verbally acknowledged these results. Electronically Signed   By: Markus Daft M.D.   On: 08/07/2021 08:28   Result Date: 08/07/2021 CLINICAL DATA:  Acute renal failure with tubular necrosis. EXAM: CT ABDOMEN AND PELVIS WITHOUT CONTRAST TECHNIQUE: Multidetector CT imaging of the abdomen and pelvis was performed following the standard protocol without IV contrast. COMPARISON:  07/28/2018 FINDINGS: Lower chest: Lung bases are clear.  No pleural effusions. Hepatobiliary: Stable 6 mm hypodensity at  the right hepatic dome probably represents a cyst and likely an incidental finding. The gallbladder has been removed. No significant biliary dilatation. Pancreas: Unremarkable. No pancreatic ductal dilatation or surrounding inflammatory changes. Spleen: Normal in size without focal abnormality. Adrenals/Urinary Tract: Normal adrenal glands. Moderate to severe bilateral hydroureteronephrosis. Bilateral ureters are severely dilated. Possible small exophytic cyst in the left kidney on sequence 3, image 30. Diffuse wall thickening of the urinary bladder with surrounding stranding. Bladder is moderately distended. Stomach/Bowel: Scattered colonic diverticula without obstruction. Evidence for a normal appendix. Contrast in small and large bowel. Small hiatal hernia. Vascular/Lymphatic: Small amount of calcium in the abdominal aorta without aneurysm. Small retroperitoneal lymph nodes that are stable. Small perirectal lymph nodes on sequence 3 image 75 and image 72 are nonspecific. Largest perirectal lymph node measures roughly 4 mm in short axis. Reproductive: Abnormal appearance of the prostate and suspect previous surgery. Poorly defined low-density involving the prostate measuring 3.6 x 2.7 cm on sequence 3, image 80. This could represent a poorly defined fluid collection and/or abnormal dilatation of the prostatic urethra. In addition, there is low-density involving the left seminal vesicles. Other: Negative for ascites.  Negative for free air. Musculoskeletal: Stable focus of sclerosis in the right ilium on sequence 3, image 57. No acute bone abnormality. IMPRESSION: 1. Moderate to severe bilateral hydroureteronephrosis likely secondary to bladder pathology. 2. Bladder is moderately distended with diffuse wall thickening and surrounding inflammatory changes. In addition, there is an abnormal appearance of the prostate with low-density in the prostate and seminal vesicles. Findings are suspicious for a poorly defined  fluid collection in this area which could be associated with a dilated prostatic urethra, disruption of the prostatic urethra or even an abscess. Recommend urology consultation. 3. Small perirectal lymph nodes are indeterminate. Overall, no significant lymph node enlargement in the abdomen or pelvis. Electronically Signed: By: Markus Daft M.D. On: 08/07/2021 08:12    Labs on Admission: I have personally reviewed following labs  CBC: Recent Labs  Lab 08/07/21 1100  WBC 10.7*  HGB 9.7*  HCT 27.4*  MCV 86.4  PLT 485   Basic Metabolic Panel: Recent Labs  Lab 08/07/21 1100  NA 136  K 4.2  CL 102  CO2 24  GLUCOSE 179*  BUN 48*  CREATININE 2.64*  CALCIUM 9.5   Liver Function Tests: Recent Labs  Lab 08/07/21 1100  AST 13*  ALT 12  ALKPHOS 59  BILITOT 0.8  PROT 7.3  ALBUMIN 3.7   Recent Labs  Lab 08/07/21 1100  LIPASE 47   Urine analysis:    Component Value Date/Time   COLORURINE YELLOW 08/07/2021 1129   APPEARANCEUR CLOUDY (A) 08/07/2021 1129   LABSPEC 1.010 08/07/2021 1129   PHURINE 6.0 08/07/2021 1129   GLUCOSEU NEGATIVE 08/07/2021 1129   HGBUR MODERATE (A) 08/07/2021 1129   BILIRUBINUR NEGATIVE 08/07/2021 [place] 08/07/2021 1129   PROTEINUR 100 (A) 08/07/2021 1129   NITRITE NEGATIVE 08/07/2021 1129  LEUKOCYTESUR LARGE (A) 08/07/2021 1129   Dr. Tobie Poet [facility]  If 7PM-7AM, please contact overnight-coverage provider If 7AM-7PM, please contact day coverage provider [email or web address]  08/07/2021, 10:02 PM

## 2021-08-07 NOTE — ED Triage Notes (Signed)
Pt comes with c/o abnormal labs. Pt states he was sent over from his kidney doctor. Pt denies any pain. Pt states possible blockage per his docs.

## 2021-08-07 NOTE — ED Notes (Signed)
Per phone call with Dr Jens Som, states CT yesterday showed bilateral hydronephrosis, possible ruptured ureter, needing to be seen by a urologist and further workup.

## 2021-08-07 NOTE — Plan of Care (Signed)
  Problem: Pain Managment: Goal: General experience of comfort will improve Outcome: Progressing   

## 2021-08-07 NOTE — ED Provider Notes (Signed)
[facility] Emergency Department Provider Note   ____________________________________________   Event Date/Time   First MD Initiated Contact with Patient 08/07/21 1114     (approximate)  I have reviewed the triage vital signs and the nursing notes.   HISTORY  Chief Complaint Abnormal labs   HPI Adam Chung is a 70 y.o. male patient saw Dr. Emily Filbert in [place] clinic.  He he had some blood work which showed acute renal failure.  CT was done.  CT showed urine backing up into the kidneys.  There are also fluid collection in the area of the prostate and seminal vesicles which "could be associate with a dilated prostatic urethra disruption of the prostatic urethra or even an abscess" patient reports he feels fine he has no pain is acting normally he has some incontinence that has had for some time.  He will dribble at night.  During the day he can feel that he has to urinate and cannot do so.  He has no pain or discomfort.         Past Medical History:  Diagnosis Date   Cancer [facility])    bladder   Diabetes mellitus without complication [facility])     Patient Active Problem List   Diagnosis Date Noted   Abscess 08/07/2021   Essential hypertension 08/07/2021   Diabetes mellitus type 2, noninsulin dependent ([place]) 08/07/2021   CKD (chronic kidney disease) stage 3, GFR 30-59 ml/min ([place]) 08/07/2021   AKI (acute kidney injury) ([place]) 08/07/2021   Acute lower UTI 08/07/2021    Past Surgical History:  Procedure Laterality Date   BLADDER SURGERY     fatty tumor excision      Prior to Admission medications   Medication Sig Start Date End Date Taking? Authorizing Provider  acetaminophen (TYLENOL) 650 MG CR tablet Take 650 mg by mouth as needed for pain.   Yes [provider]  amLODipine (NORVASC) 5 MG tablet Take 5 mg by mouth daily.   Yes [provider]  Cholecalciferol 50 MCG (2000 UT) TABS Take 3,000 Units by mouth daily.   Yes  [provider]  glimepiride (AMARYL) 4 MG tablet Take 4 mg by mouth daily with breakfast.   Yes [provider]  olmesartan (BENICAR) 20 MG tablet Take 20 mg by mouth daily. Patient not taking: Reported on 08/07/2021    [provider]  Semaglutide,0.25 or 0.5MG /DOS, 2 MG/1.5ML SOPN Inject 0.375 mLs into the skin once a week. 07/16/21 08/15/21  [provider]  tamsulosin (FLOMAX) 0.4 MG CAPS capsule Take 0.4 mg by mouth daily. Patient not taking: Reported on 08/07/2021    [provider]    Allergies Apple and Penicillins  No family history on file.  Social History Social History   Tobacco Use   Smoking status: Former   Smokeless tobacco: Never  Substance Use Topics   Alcohol use: Not Currently    Review of Systems  Constitutional: No fever/chills Eyes: No visual changes. ENT: No sore throat. Cardiovascular: Denies chest pain. Respiratory: Denies shortness of breath. Gastrointestinal: No abdominal pain.  No nausea, no vomiting.  No diarrhea.  No constipation. Genitourinary: Negative for dysuria. Musculoskeletal: Negative for back pain. Skin: Negative for rash. Neurological: Negative for headaches, focal weakness o  ____________________________________________   PHYSICAL EXAM:  VITAL SIGNS: ED Triage Vitals [08/07/21 1059]  Enc Vitals Group     BP (!) 147/85     Pulse Rate 98     Resp  18     Temp 98 F (36.7 C)     Temp src      SpO2 98 %     Weight      Height      Head Circumference      Peak Flow      Pain Score 0     Pain Loc      Pain Edu?      Excl. in [place]?     Constitutional: Alert and oriented. Well appearing and in no acute distress. Eyes: Conjunctivae are normal.  Head: Atraumatic. Nose: No congestion/rhinnorhea. Mouth/Throat: Mucous membranes are moist.  Oropharynx non-erythematous. Neck: No stridor. Cardiovascular: Normal rate, regular rhythm. Grossly normal heart sounds.  Good peripheral  circulation. Respiratory: Normal respiratory effort.  No retractions. Lungs CTAB. Gastrointestinal: Soft and nontender. No distention. No abdominal bruits. No CVA tenderness. Musculoskeletal: No lower extremity tenderness nor edema.   Neurologic:  Normal speech and language. No gross focal neurologic deficits are appreciated. No gait instability. Skin:  Skin is warm, dry and intact. No rash noted.   ____________________________________________   LABS (all labs ordered are listed, but only abnormal results are displayed)  Labs Reviewed  COMPREHENSIVE METABOLIC PANEL - Abnormal; Notable for the following components:      Result Value   Glucose, Bld 179 (*)    BUN 48 (*)    Creatinine, Ser 2.64 (*)    AST 13 (*)    GFR, Estimated 25 (*)    All other components within normal limits  CBC - Abnormal; Notable for the following components:   WBC 10.7 (*)    RBC 3.17 (*)    Hemoglobin 9.7 (*)    HCT 27.4 (*)    All other components within normal limits  URINALYSIS, COMPLETE (UACMP) WITH MICROSCOPIC - Abnormal; Notable for the following components:   APPearance CLOUDY (*)    Hgb urine dipstick MODERATE (*)    Protein, ur 100 (*)    Leukocytes,Ua LARGE (*)    Non Squamous Epithelial PRESENT (*)    Bacteria, UA RARE (*)    All other components within normal limits  GLUCOSE, CAPILLARY - Abnormal; Notable for the following components:   Glucose-Capillary 125 (*)    All other components within normal limits  RESP PANEL BY RT-PCR (FLU A&B, COVID) ARPGX2  URINE CULTURE  LIPASE, BLOOD  HEMOGLOBIN A1C  HIV ANTIBODY (ROUTINE TESTING W REFLEX)  BASIC METABOLIC PANEL  CBC   ____________________________________________  EKG   ____________________________________________  RADIOLOGY Gertha Calkin, personally viewed and evaluated these images (plain radiographs) as part of my medical decision making, as well as reviewing the written report by the radiologist.  ED MD  interpretation: CT done as an outpatient shows information quoted in HPI.  I did review the film.  Official radiology report(s): No results found.  ____________________________________________   PROCEDURES  Procedure(s) performed (including Critical Care):  Procedures   ____________________________________________   INITIAL IMPRESSION / ASSESSMENT AND PLAN / ED COURSE  ----------------------------------------- 12:40 PM on 08/07/2021 ----------------------------------------- After the third page the OR nurse calls back and says that Dr. Abner Greenspan is in the operating room and should be done in about 90 minutes.  I have left a detailed message with her about the patient's location and the findings on the CT scan patient's rising creatinine etc. I have also told him that because the CT shows a possible disruption in the prostatic urethra I do not want to put a  Foley in the gentleman.  He is not in any pain or discomfort at all right this minute.  He is making urine.  I anticipate the urologist will be down shortly.   ----------------------------------------- 1:49 PM on 08/07/2021 ----------------------------------------- Dr. Abner Greenspan calls back he is got 2 more cases he is in [place].  He wants [place] to try to do a Foley.  He is more concerned about the obstruction and does not think there is any perforation of the prostatic urethra.  Patient has been able to urinate.  He is urinating right now.  We will attempt to pass the Foley and I will also give him some antibiotics as his urine has come back showing both red and white cells in the urine.  I have ordered a urine culture.          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    Nena Polio, MD 08/07/21 2014

## 2021-08-07 NOTE — ED Notes (Signed)
ED Provider at bedside. 

## 2021-08-07 NOTE — Consult Note (Signed)
Urology Consult   Physician requesting consult: Amy Cox, DO  Reason for consult: Urinary retention, bilateral hydronephrosis, UTI  History of Present Illness: Adam Chung is a 69 y.o. who presented to the ED today as he was told to present after a CT A/P performed on 08/06/2021 demonstrated bilateral hydronephrosis.  He stated that over the past few months, he has noticed weakening flow of stream with worsening urinary incontinence typically at night.  He denies taking an alpha-blocker.  He denies a sensation of incomplete bladder emptying.  He denies dysuria prior to nursing attempting to place catheter today.  He denies gross hematuria.  He denies fevers or chills.  He denies sensations or symptoms of prostatitis.  He denies a symptom of discomfort with sitting or sensation of a bulge in his perineum.  He is a former patient of Dr. Edrick Oh, MD and has a history of LG Ta UCB.  He underwent bladder biopsy with left ureteroscopy and left ureteral stent placement on 05/21/2014.  He has not had routine cystoscopy evaluations.  He has not seen a urologist since 2015.  He denies a history of TURP or other bladder outlet obstruction procedures.  CT A/P 08/06/2021 demonstrates severe bilateral hydroureteronephrosis to the level of the bladder with bladder wall thickening and surrounding inflammatory changes as well as a dilated prostatic urethra.  There is mention of possible abscess in this location however upon my review of the imaging, this appears to be a dilated prostatic urethra.  Creatinine is 2.64 on 08/07/2021 from 2.31 on 07/11/2021 which represents a rise from 0.9 in 2019.  He is afebrile with a white blood cell count of 10.7, urinalysis negative nitrite, evidence of microscopic hematuria.  He does have a past medical history of diabetes, hypertension and TURBT as above.  He denies other medical problems.  Past Medical History:  Diagnosis Date   Cancer [facility])    bladder   Diabetes  mellitus without complication ([place])     Past Surgical History:  Procedure Laterality Date   BLADDER SURGERY     fatty tumor excision      Current Hospital Medications:  Home Meds:  No current facility-administered medications on file prior to encounter.   Current Outpatient Medications on File Prior to Encounter  Medication Sig Dispense Refill   acetaminophen (TYLENOL) 650 MG CR tablet Take 650 mg by mouth as needed for pain.     amLODipine (NORVASC) 5 MG tablet Take 5 mg by mouth daily.     Cholecalciferol 50 MCG (2000 UT) TABS Take 3,000 Units by mouth daily.     glimepiride (AMARYL) 4 MG tablet Take 4 mg by mouth daily with breakfast.     olmesartan (BENICAR) 20 MG tablet Take 20 mg by mouth daily. (Patient not taking: Reported on 08/07/2021)     Semaglutide,0.25 or 0.5MG /DOS, 2 MG/1.5ML SOPN Inject 0.375 mLs into the skin once a week.     tamsulosin (FLOMAX) 0.4 MG CAPS capsule Take 0.4 mg by mouth daily. (Patient not taking: Reported on 08/07/2021)       Scheduled Meds:  [START ON 08/08/2021] heparin  5,000 Units Subcutaneous Q8H   insulin aspart  0-5 Units Subcutaneous QHS   insulin aspart  0-9 Units Subcutaneous TID WC   tamsulosin  0.4 mg Oral Daily   Continuous Infusions: PRN Meds:.acetaminophen **OR** acetaminophen, morphine injection, morphine injection, ondansetron **OR** ondansetron (ZOFRAN) IV  Allergies:  Allergies  Allergen Reactions   Apple Anaphylaxis   Penicillins Swelling  No family history on file.  Social History:  reports that he has quit smoking. He has never used smokeless tobacco. He reports that he does not currently use alcohol. No history on file for drug use.  ROS: A complete review of systems was performed.  All systems are negative except for pertinent findings as noted.  Physical Exam:  Vital signs in last 24 hours: Temp:  [98 F (36.7 C)-98.6 F (37 C)] 98.6 F (37 C) (09/09 1617) Pulse Rate:  [98-101] 101 (09/09 1617) Resp:  [18]  18 (09/09 1617) BP: (147-181)/(85-92) 181/92 (09/09 1617) SpO2:  [98 %-100 %] 100 % (09/09 1617) Constitutional:  Alert and oriented, No acute distress Cardiovascular: Regular rate and rhythm Respiratory: Normal respiratory effort, Lungs clear bilaterally GI: Abdomen is soft, nontender, nondistended, no abdominal masses GU: No CVA tenderness; DRE with 50 gram prostate, no nodules, no tenderness Neurologic: Grossly intact, no focal deficits Psychiatric: Normal mood and affect  Laboratory Data:  Recent Labs    08/07/21 1100  WBC 10.7*  HGB 9.7*  HCT 27.4*  PLT 219    Recent Labs    08/07/21 1100  NA 136  K 4.2  CL 102  GLUCOSE 179*  BUN 48*  CALCIUM 9.5  CREATININE 2.64*     Results for orders placed or performed during the hospital encounter of 08/07/21 (from the past 24 hour(s))  Lipase, blood     Status: None   Collection Time: 08/07/21 11:00 AM  Result Value Ref Range   Lipase 47 11 - 51 U/L  Comprehensive metabolic panel     Status: Abnormal   Collection Time: 08/07/21 11:00 AM  Result Value Ref Range   Sodium 136 135 - 145 mmol/L   Potassium 4.2 3.5 - 5.1 mmol/L   Chloride 102 98 - 111 mmol/L   CO2 24 22 - 32 mmol/L   Glucose, Bld 179 (H) 70 - 99 mg/dL   BUN 48 (H) 8 - 23 mg/dL   Creatinine, Ser 2.64 (H) 0.61 - 1.24 mg/dL   Calcium 9.5 8.9 - 10.3 mg/dL   Total Protein 7.3 6.5 - 8.1 g/dL   Albumin 3.7 3.5 - 5.0 g/dL   AST 13 (L) 15 - 41 U/L   ALT 12 0 - 44 U/L   Alkaline Phosphatase 59 38 - 126 U/L   Total Bilirubin 0.8 0.3 - 1.2 mg/dL   GFR, Estimated 25 (L) >60 mL/min   Anion gap 10 5 - 15  CBC     Status: Abnormal   Collection Time: 08/07/21 11:00 AM  Result Value Ref Range   WBC 10.7 (H) 4.0 - 10.5 K/uL   RBC 3.17 (L) 4.22 - 5.81 MIL/uL   Hemoglobin 9.7 (L) 13.0 - 17.0 g/dL   HCT 27.4 (L) 39.0 - 52.0 %   MCV 86.4 80.0 - 100.0 fL   MCH 30.6 26.0 - 34.0 pg   MCHC 35.4 30.0 - 36.0 g/dL   RDW 12.5 11.5 - 15.5 %   Platelets 219 150 - 400 K/uL    nRBC 0.0 0.0 - 0.2 %  Urinalysis, Complete w Microscopic Urine, Random     Status: Abnormal   Collection Time: 08/07/21 11:29 AM  Result Value Ref Range   Color, Urine YELLOW YELLOW   APPearance CLOUDY (A) CLEAR   Specific Gravity, Urine 1.010 1.005 - 1.030   pH 6.0 5.0 - 8.0   Glucose, UA NEGATIVE NEGATIVE mg/dL   Hgb urine dipstick MODERATE (A) NEGATIVE  Bilirubin Urine NEGATIVE NEGATIVE   Ketones, ur NEGATIVE NEGATIVE mg/dL   Protein, ur 100 (A) NEGATIVE mg/dL   Nitrite NEGATIVE NEGATIVE   Leukocytes,Ua LARGE (A) NEGATIVE   Squamous Epithelial / LPF NONE SEEN 0 - 5   Non Squamous Epithelial PRESENT (A) NONE SEEN   WBC, UA >50 0 - 5 WBC/hpf   RBC / HPF 21-50 0 - 5 RBC/hpf   Bacteria, UA RARE (A) NONE SEEN   WBC Clumps PRESENT    Mucus PRESENT    Hyaline Casts, UA PRESENT   Resp Panel by RT-PCR (Flu A&B, Covid) Nasopharyngeal Swab     Status: None   Collection Time: 08/07/21 12:00 PM   Specimen: Nasopharyngeal Swab; Nasopharyngeal(NP) swabs in vial transport medium  Result Value Ref Range   SARS Coronavirus 2 by RT PCR NEGATIVE NEGATIVE   Influenza A by PCR NEGATIVE NEGATIVE   Influenza B by PCR NEGATIVE NEGATIVE   Recent Results (from the past 240 hour(s))  Resp Panel by RT-PCR (Flu A&B, Covid) Nasopharyngeal Swab     Status: None   Collection Time: 08/07/21 12:00 PM   Specimen: Nasopharyngeal Swab; Nasopharyngeal(NP) swabs in vial transport medium  Result Value Ref Range Status   SARS Coronavirus 2 by RT PCR NEGATIVE NEGATIVE Final    Comment: (NOTE) SARS-CoV-2 target nucleic acids are NOT DETECTED.  The SARS-CoV-2 RNA is generally detectable in upper respiratory specimens during the acute phase of infection. The lowest concentration of SARS-CoV-2 viral copies this assay can detect is 138 copies/mL. A negative result does not preclude SARS-Cov-2 infection and should not be used as the sole basis for treatment or other patient management decisions. A negative result  may occur with  improper specimen collection/handling, submission of specimen other than nasopharyngeal swab, presence of viral mutation(s) within the areas targeted by this assay, and inadequate number of viral copies(<138 copies/mL). A negative result must be combined with clinical observations, patient history, and epidemiological information. The expected result is Negative.  Fact Sheet for Patients:  EntrepreneurPulse.com.au  Fact Sheet for Healthcare Providers:  IncredibleEmployment.be  This test is no t yet approved or cleared by the [place] FDA and  has been authorized for detection and/or diagnosis of SARS-CoV-2 by FDA under an Emergency Use Authorization (EUA). This EUA will remain  in effect (meaning this test can be used) for the duration of the COVID-19 declaration under Section 564(b)(1) of the Act, 21 U.S.C.section 360bbb-3(b)(1), unless the authorization is terminated  or revoked sooner.       Influenza A by PCR NEGATIVE NEGATIVE Final   Influenza B by PCR NEGATIVE NEGATIVE Final    Comment: (NOTE) The Xpert Xpress SARS-CoV-2/FLU/RSV plus assay is intended as an aid in the diagnosis of influenza from Nasopharyngeal swab specimens and should not be used as a sole basis for treatment. Nasal washings and aspirates are unacceptable for Xpert Xpress SARS-CoV-2/FLU/RSV testing.  Fact Sheet for Patients: EntrepreneurPulse.com.au  Fact Sheet for Healthcare Providers: IncredibleEmployment.be  This test is not yet approved or cleared by the [place] FDA and has been authorized for detection and/or diagnosis of SARS-CoV-2 by FDA under an Emergency Use Authorization (EUA). This EUA will remain in effect (meaning this test can be used) for the duration of the COVID-19 declaration under Section 564(b)(1) of the Act, 21 U.S.C. section 360bbb-3(b)(1), unless the authorization is terminated  or revoked.  Performed at [facility], [address], [place], [place] [address]     Renal Function: Recent  Labs    08/07/21 1100  CREATININE 2.64*   CrCl cannot be calculated (Unknown ideal weight.).  Radiologic Imaging: CT ABDOMEN PELVIS WO CONTRAST  Addendum Date: 08/07/2021   ADDENDUM REPORT: 08/07/2021 08:28 ADDENDUM: These results were called by telephone at the time of interpretation on 08/07/2021 at 8:20 am to provider Dr. Ramonita Lab, who verbally acknowledged these results. Electronically Signed   By: Markus Daft M.D.   On: 08/07/2021 08:28   Result Date: 08/07/2021 CLINICAL DATA:  Acute renal failure with tubular necrosis. EXAM: CT ABDOMEN AND PELVIS WITHOUT CONTRAST TECHNIQUE: Multidetector CT imaging of the abdomen and pelvis was performed following the standard protocol without IV contrast. COMPARISON:  07/28/2018 FINDINGS: Lower chest: Lung bases are clear.  No pleural effusions. Hepatobiliary: Stable 6 mm hypodensity at the right hepatic dome probably represents a cyst and likely an incidental finding. The gallbladder has been removed. No significant biliary dilatation. Pancreas: Unremarkable. No pancreatic ductal dilatation or surrounding inflammatory changes. Spleen: Normal in size without focal abnormality. Adrenals/Urinary Tract: Normal adrenal glands. Moderate to severe bilateral hydroureteronephrosis. Bilateral ureters are severely dilated. Possible small exophytic cyst in the left kidney on sequence 3, image 30. Diffuse wall thickening of the urinary bladder with surrounding stranding. Bladder is moderately distended. Stomach/Bowel: Scattered colonic diverticula without obstruction. Evidence for a normal appendix. Contrast in small and large bowel. Small hiatal hernia. Vascular/Lymphatic: Small amount of calcium in the abdominal aorta without aneurysm. Small retroperitoneal lymph nodes that are stable. Small perirectal lymph nodes on sequence 3 image 75 and image 72  are nonspecific. Largest perirectal lymph node measures roughly 4 mm in short axis. Reproductive: Abnormal appearance of the prostate and suspect previous surgery. Poorly defined low-density involving the prostate measuring 3.6 x 2.7 cm on sequence 3, image 80. This could represent a poorly defined fluid collection and/or abnormal dilatation of the prostatic urethra. In addition, there is low-density involving the left seminal vesicles. Other: Negative for ascites.  Negative for free air. Musculoskeletal: Stable focus of sclerosis in the right ilium on sequence 3, image 57. No acute bone abnormality. IMPRESSION: 1. Moderate to severe bilateral hydroureteronephrosis likely secondary to bladder pathology. 2. Bladder is moderately distended with diffuse wall thickening and surrounding inflammatory changes. In addition, there is an abnormal appearance of the prostate with low-density in the prostate and seminal vesicles. Findings are suspicious for a poorly defined fluid collection in this area which could be associated with a dilated prostatic urethra, disruption of the prostatic urethra or even an abscess. Recommend urology consultation. 3. Small perirectal lymph nodes are indeterminate. Overall, no significant lymph node enlargement in the abdomen or pelvis. Electronically Signed: By: Markus Daft M.D. On: 08/07/2021 08:12    I independently reviewed the above imaging studies.  Impression/Recommendation Urinary retention: CT A/P 08/06/2021 with bilateral severe hydroureteronephrosis to the level of the bladder, distended bladder and dilated prostatic urethra.  S/p bedside cystoscopy with Foley catheter placement over a wire on 08/07/2021 with return of 350 mL clear yellow urine. 2.  Bilateral hydronephrosis: Seen on CT A/P 08/06/2021.  Likely related to bladder outlet obstruction from enlarged prostate and bulbar urethral stricture. #3.  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 #4.  AKI: Creatinine 2.64 on 08/07/2021 from baseline of less than 1 in 2019. #5.  Possible UTI: Urinalysis with negative nitrite.  Urine culture pending.  He denied dysuria until attempted Foley catheter placement.  CT A/P 08/06/2021 with evidence of low density hypoattenuation in the prostate which I favor I dilated prostatic urethra given his absence of symptoms. 6.  Microscopic hematuria: Seen on urinalysis 08/07/2021.  Limited bedside cystoscopy without bladder lesion however this will need to be repeated as an outpatient given debris seen today. 7.  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  -I was able to place a Foley catheter over a wire with the aid of a cystoscope.  [place] Foley catheter placed with return of 350 mL clear yellow urine.  Cystoscopy was limited given debris and he will need a diagnostic cystoscopy as an outpatient upon discharge. -I recommend leaving Foley catheter in place for at least 5 days given evidence of bulbar urethral stricture and false passage with prior Foley catheter attempt. -We will plan for reimaging with CT hematuria protocol (if his kidney function is appropriate) in 48 hours to ensure that his hydronephrosis resolves and that he has no underlying fluid collection within his prostate. -I really think that the fluid collection is a dilated prostatic urethra given his absence of symptoms with no complaints of dysuria, pain, fevers or chills and no tenderness with DRE.  This makes sense given his bulbar urethral stricture which was identified on cystoscopy. -Follow-up urine culture.  Antibiotics per primary.  Matt R. Lallie Strahm MD 08/07/2021, 5:06 PM  [facility]  Pager: [phone]   CC: Amy Cox, DO

## 2021-08-07 NOTE — ED Notes (Signed)
This RN attempting foley insertion without success

## 2021-08-07 NOTE — Procedures (Signed)
Procedure Note  Cystoscopy procedure note:  Patient identification was confirmed, informed consent was obtained, and patient was prepped using Betadine solution.  Lidocaine jelly was administered per urethral meatus.     Pre-Procedure: - Inspection reveals a normal caliber ureteral meatus.  Procedure: The flexible cystoscope was introduced without difficulty - I identified an approximately 10 Fr bulbar urethral stricture less than 1cm in length. There was evidence of multiple false passages beneath the true lumen. A sensor wire was navigated into the true lumen into the bladder.  I was unable to navigate the cystoscope over the wire and into the bladder.  I did note bilobar hypertrophy.  I attempted a complete look at the bladder however this is limited given the debris and mild amount of blood present.  I then removed the scope leaving the wire in place.  Over this wire, I then placed a 16 [place] [employer] Foley catheter.  This was inflated with 10 mL sterile water.  He then drained approximately 350 mL clear yellow urine. -Of the bladder mucosa that was able to surveilled, there is no evidence of any tumors. - No bladder stones - No trabeculation  Retroflexion shows mild amount of prostatic intravesical component.  Post-Procedure: - Patient tolerated the procedure well  Assessment/ Plan: Leave Foley catheter to gravity.  We will plan to reimage in approximately 48 hours to ensure resolution of hydronephrosis.  Continue antibiotics per primary.  Follow-up urine culture.  [facility]  Pager: [phone]

## 2021-08-07 NOTE — ED Notes (Signed)
Georgina Peer RN aware of assigned bed

## 2021-08-07 NOTE — ED Notes (Signed)
Rns attempted coude foley insertion without success. MD made aware

## 2021-08-07 NOTE — Telephone Encounter (Signed)
[facility] calling for New Patient appt for ASAP, please refer to CT scan. Per Dr. John Giovanni pt should go to the ER to be evaluated by on call provider, due to staff shortage at [facility].

## 2021-08-08 DIAGNOSIS — N401 Enlarged prostate with lower urinary tract symptoms: Secondary | ICD-10-CM | POA: Diagnosis present

## 2021-08-08 DIAGNOSIS — Z87891 Personal history of nicotine dependence: Secondary | ICD-10-CM | POA: Diagnosis not present

## 2021-08-08 DIAGNOSIS — E119 Type 2 diabetes mellitus without complications: Secondary | ICD-10-CM | POA: Diagnosis not present

## 2021-08-08 DIAGNOSIS — Z91018 Allergy to other foods: Secondary | ICD-10-CM | POA: Diagnosis not present

## 2021-08-08 DIAGNOSIS — Z880 Allergy status to penicillin: Secondary | ICD-10-CM | POA: Diagnosis not present

## 2021-08-08 DIAGNOSIS — E1122 Type 2 diabetes mellitus with diabetic chronic kidney disease: Secondary | ICD-10-CM | POA: Diagnosis present

## 2021-08-08 DIAGNOSIS — Z20822 Contact with and (suspected) exposure to covid-19: Secondary | ICD-10-CM | POA: Diagnosis present

## 2021-08-08 DIAGNOSIS — N1339 Other hydronephrosis: Secondary | ICD-10-CM | POA: Diagnosis not present

## 2021-08-08 DIAGNOSIS — Z7984 Long term (current) use of oral hypoglycemic drugs: Secondary | ICD-10-CM | POA: Diagnosis not present

## 2021-08-08 DIAGNOSIS — N412 Abscess of prostate: Secondary | ICD-10-CM | POA: Diagnosis present

## 2021-08-08 DIAGNOSIS — N179 Acute kidney failure, unspecified: Secondary | ICD-10-CM | POA: Diagnosis present

## 2021-08-08 DIAGNOSIS — N136 Pyonephrosis: Secondary | ICD-10-CM | POA: Diagnosis present

## 2021-08-08 DIAGNOSIS — Z79899 Other long term (current) drug therapy: Secondary | ICD-10-CM | POA: Diagnosis not present

## 2021-08-08 DIAGNOSIS — N35912 Unspecified bulbous urethral stricture, male: Secondary | ICD-10-CM | POA: Diagnosis present

## 2021-08-08 DIAGNOSIS — L0291 Cutaneous abscess, unspecified: Secondary | ICD-10-CM | POA: Diagnosis not present

## 2021-08-08 DIAGNOSIS — Z8551 Personal history of malignant neoplasm of bladder: Secondary | ICD-10-CM | POA: Diagnosis not present

## 2021-08-08 DIAGNOSIS — N1831 Chronic kidney disease, stage 3a: Secondary | ICD-10-CM | POA: Diagnosis present

## 2021-08-08 DIAGNOSIS — Z87440 Personal history of urinary (tract) infections: Secondary | ICD-10-CM | POA: Diagnosis not present

## 2021-08-08 DIAGNOSIS — I129 Hypertensive chronic kidney disease with stage 1 through stage 4 chronic kidney disease, or unspecified chronic kidney disease: Secondary | ICD-10-CM | POA: Diagnosis present

## 2021-08-08 DIAGNOSIS — R32 Unspecified urinary incontinence: Secondary | ICD-10-CM | POA: Diagnosis present

## 2021-08-08 DIAGNOSIS — R338 Other retention of urine: Secondary | ICD-10-CM | POA: Diagnosis not present

## 2021-08-08 DIAGNOSIS — R3129 Other microscopic hematuria: Secondary | ICD-10-CM | POA: Diagnosis present

## 2021-08-08 DIAGNOSIS — N3289 Other specified disorders of bladder: Secondary | ICD-10-CM | POA: Diagnosis present

## 2021-08-08 LAB — BASIC METABOLIC PANEL
Anion gap: 10 (ref 5–15)
BUN: 49 mg/dL — ABNORMAL HIGH (ref 8–23)
CO2: 22 mmol/L (ref 22–32)
Calcium: 8.8 mg/dL — ABNORMAL LOW (ref 8.9–10.3)
Chloride: 107 mmol/L (ref 98–111)
Creatinine, Ser: 2.4 mg/dL — ABNORMAL HIGH (ref 0.61–1.24)
GFR, Estimated: 28 mL/min — ABNORMAL LOW (ref >60)
Glucose, Bld: 125 mg/dL — ABNORMAL HIGH (ref 70–99)
Potassium: 4.3 mmol/L (ref 3.5–5.1)
Sodium: 139 mmol/L (ref 135–145)

## 2021-08-08 LAB — CBC
HCT: 25 % — ABNORMAL LOW (ref 39.0–52.0)
Hemoglobin: 8.6 g/dL — ABNORMAL LOW (ref 13.0–17.0)
MCH: 29.9 pg (ref 26.0–34.0)
MCHC: 34.4 g/dL (ref 30.0–36.0)
MCV: 86.8 fL (ref 80.0–100.0)
Platelets: 206 10*3/uL (ref 150–400)
RBC: 2.88 MIL/uL — ABNORMAL LOW (ref 4.22–5.81)
RDW: 12.5 % (ref 11.5–15.5)
WBC: 13.9 10*3/uL — ABNORMAL HIGH (ref 4.0–10.5)
nRBC: 0 % (ref 0.0–0.2)

## 2021-08-08 LAB — GLUCOSE, CAPILLARY
Glucose-Capillary: 108 mg/dL — ABNORMAL HIGH (ref 70–99)
Glucose-Capillary: 109 mg/dL — ABNORMAL HIGH (ref 70–99)
Glucose-Capillary: 159 mg/dL — ABNORMAL HIGH (ref 70–99)
Glucose-Capillary: 121 mg/dL — ABNORMAL HIGH (ref 70–99)

## 2021-08-08 LAB — HIV ANTIBODY (ROUTINE TESTING W REFLEX): HIV Screen 4th Generation wRfx: NONREACTIVE

## 2021-08-08 MED ORDER — SODIUM CHLORIDE 0.45 % IV SOLN: INTRAVENOUS | Status: DC

## 2021-08-08 MED ORDER — MORPHINE SULFATE (PF) 2 MG/ML IV SOLN
2.0000 mg | Freq: Four times a day (QID) | INTRAVENOUS | Status: DC | PRN
  Administered 2021-08-08 – 2021-08-09 (×3): 2 mg via INTRAVENOUS
  Filled 2021-08-08 (×3): qty 1

## 2021-08-08 MED ORDER — CHLORHEXIDINE GLUCONATE CLOTH 2 % EX PADS
6.0000 | MEDICATED_PAD | Freq: Every day | CUTANEOUS | Status: DC
  Administered 2021-08-08 – 2021-08-11 (×4): 6 via TOPICAL

## 2021-08-08 NOTE — TOC CM/SW Note (Signed)
CSW completed chart review.  No TOC needs identified. Please place [facility] consult if needs arise.  Oleh Genin, [place]

## 2021-08-08 NOTE — Progress Notes (Signed)
   08/08/21 1639  Assess: MEWS Score  Temp (!) 102.6 F (39.2 C) (RN Kim notified)  BP (!) 153/78  Pulse Rate (!) 115 (RN Kim notified)  Resp 19  SpO2 95 %  Assess: MEWS Score  MEWS Temp 2  MEWS Systolic 0  MEWS Pulse 2  MEWS RR 0  MEWS LOC 0  MEWS Score 4  MEWS Score Color Red  Assess: if the MEWS score is Yellow or Red  Were vital signs taken at a resting state? Yes  Focused Assessment No change from prior assessment  Does the patient meet 2 or more of the SIRS criteria? Yes  Does the patient have a confirmed or suspected source of infection? Yes  Provider and Rapid Response Notified? Yes  MEWS guidelines implemented *See Row Information* Yes  Treat  MEWS Interventions Administered prn meds/treatments  Pain Scale 0-10  Take Vital Signs  Increase Vital Sign Frequency  Red: Q 1hr X 4 then Q 4hr X 4, if remains red, continue Q 4hrs  Escalate  MEWS: Escalate Red: discuss with charge nurse/RN and provider, consider discussing with RRT  Notify: Charge Nurse/RN  Name of Charge Nurse/RN Notified Claiborne Billings Isenhour RN  Date Charge Nurse/RN Notified 08/08/21  Time Charge Nurse/RN Notified 1645  Notify: Provider  Provider Name/Title Dr. Fritzi Mandes  Date Provider Notified 08/08/21  Time Provider Notified 1645  Notification Type Page  Notification Reason Other (Comment) (Per protocol)  Provider response No new orders  Date of Provider Response 08/08/21  Time of Provider Response [id number]  Notify: Rapid Response  Name of Rapid Response RN Notified Skeet Latch  Date Rapid Response Notified 08/08/21  Time Rapid Response Notified 6812  Document  Patient Outcome Stabilized after interventions  Progress note created (see row info) Yes  Assess: SIRS CRITERIA  SIRS Temperature  1  SIRS Pulse 1  SIRS Respirations  0  SIRS WBC 0  SIRS Score Sum  2

## 2021-08-08 NOTE — Progress Notes (Signed)
Pharmacy Antibiotic Note  BABYBOY LOYA is a 70 y.o. male admitted on 08/07/2021 with UTI.  Pharmacy has been consulted for Levaquin dosing.  Plan: Pt given Levaquin 500 mg IV once.  Ordered Levaquin 250 mg IV daily x 4 days per indication and current renal function.  Pharmacy will continue to follow and adjust dose when warranted.  Height: 5\' 11"  (180.3 cm) Weight: 82 kg (180 lb 12.4 oz) IBW/kg (Calculated) : 75.3  Temp (24hrs), Avg:98.6 F (37 C), Min:98 F (36.7 C), Max:99.3 F (37.4 C)  Recent Labs  Lab 08/07/21 1100  WBC 10.7*  CREATININE 2.64*    Estimated Creatinine Clearance: 27.7 mL/min (A) (by C-G formula based on SCr of 2.64 mg/dL (H)).    Allergies  Allergen Reactions   Apple Anaphylaxis   Penicillins Swelling    Antimicrobials this admission: 9/09 Levaquin >> x 5 days  Microbiology results: 9/09 UCx: Pending   Thank you for allowing pharmacy to be a part of this patient's care.  Renda Rolls, PharmD, MBA 08/08/2021 2:30 AM

## 2021-08-08 NOTE — Progress Notes (Signed)
  Subjective: Denies pain, fevers, chills, nausea or emesis. Tolerating foley - draining clear yellow urine. Tolerating diet.   Objective: Vital signs in last 24 hours: Temp:  [97.3 F (36.3 C)-99.3 F (37.4 C)] 97.5 F (36.4 C) (09/10 0803) Pulse Rate:  [99-106] 99 (09/10 0803) Resp:  [16-18] 16 (09/10 0803) BP: (126-181)/(68-92) 134/68 (09/10 0803) SpO2:  [97 %-100 %] 98 % (09/10 0803) Weight:  [82 kg] 82 kg (09/09 1700)  Intake/Output from previous day: 09/09 0701 - 09/10 0700 In: 922.5 [P.O.:240; I.V.:682.5] Out: 1100 [Urine:1100] Intake/Output this shift: Total I/O In: 600 [P.O.:600] Out: 1000 [Urine:1000]  Physical Exam:  General: Alert and oriented CV: RRR Lungs: Clear Abdomen: Soft, ND, NT Ext: NT, No erythema  Lab Results: Recent Labs    08/07/21 1100 08/08/21 0411  HGB 9.7* 8.6*  HCT 27.4* 25.0*   BMET Recent Labs    08/07/21 1100 08/08/21 0411  NA 136 139  K 4.2 4.3  CL 102 107  CO2 24 22  GLUCOSE 179* 125*  BUN 48* 49*  CREATININE 2.64* 2.40*  CALCIUM 9.5 8.8*     Studies/Results: No results found.  Assessment/Plan: Urinary retention: CT A/P 08/06/2021 with bilateral severe hydroureteronephrosis to the level of the bladder, distended bladder and dilated prostatic urethra.  S/p bedside cystoscopy with Foley catheter placement over a wire on 08/07/2021 with return of 350 mL clear yellow urine. Bilateral hydronephrosis: Seen on CT A/P 08/06/2021.  Likely related to bladder outlet obstruction from enlarged prostate and bulbar urethral stricture. 3.  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 4.  AKI: Creatinine 2.64 on 08/07/2021 from baseline of less than 1 in 2019. 5.  Possible UTI: Urinalysis with negative nitrite.  Urine culture pending.  He denied dysuria until attempted Foley catheter placement.  CT A/P 08/06/2021 with evidence of low density  hypoattenuation in the prostate which I favor I dilated prostatic urethra given his absence of symptoms. 6.  Microscopic hematuria: Seen on urinalysis 08/07/2021.  Limited bedside cystoscopy without bladder lesion however this will need to be repeated as an outpatient given debris seen today. 7.  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  -Tolerating foley catheter well with clear yellow urine. 1L already recorded today. -Noted labs, slight leukocytosis, likely reactive to procedure yesterday. Remains afebrile. Creatinine only slight downtrend at 2.4. -Leave foley to gravity for at least 5 days given dilation of stricture -Will plan for CT imaging tomorrow, CT hematuria protocol if kidney function allows, to eval for resolution of hydronephrosis -NPO after midnight in case of need for procedure tomorrow -Continue abx   LOS: 0 days   Matt R. Voula Waln MD 08/08/2021, 3:34 PM [facility]  Pager: [phone]

## 2021-08-08 NOTE — Progress Notes (Addendum)
[place] at [place] NAME: Adam Chung    MR#:  [id number]  DATE OF BIRTH:  02/22/1951  SUBJECTIVE:  patient came in with abnormal lab results and CT scan is informed by his primary care physician as part of workup for elevated creatinine family in the room. Patient denies any complaints. Good urine output post procedure. REVIEW OF SYSTEMS:   Review of Systems  Constitutional:  Negative for chills, fever and weight loss.  HENT:  Negative for ear discharge, ear pain and nosebleeds.   Eyes:  Negative for blurred vision, pain and discharge.  Respiratory:  Negative for sputum production, shortness of breath, wheezing and stridor.   Cardiovascular:  Negative for chest pain, palpitations, orthopnea and PND.  Gastrointestinal:  Negative for abdominal pain, diarrhea, nausea and vomiting.  Genitourinary:  Negative for frequency and urgency.  Musculoskeletal:  Negative for back pain and joint pain.  Neurological:  Negative for sensory change, speech change, focal weakness and weakness.  Psychiatric/Behavioral:  Negative for depression and hallucinations. The patient is not nervous/anxious.   Tolerating Diet: Tolerating PT:   DRUG ALLERGIES:   Allergies  Allergen Reactions  . Apple Anaphylaxis  . Penicillins Swelling    VITALS:  Blood pressure 134/68, pulse 99, temperature (!) 97.5 F (36.4 C), resp. rate 16, height 5\' 11"  (1.803 m), weight 82 kg, SpO2 98 %.  PHYSICAL EXAMINATION:   Physical Exam  GENERAL:  70 y.o.-year-old patient lying in the bed with no acute distress.  HEENT: Head atraumatic, normocephalic. Oropharynx and nasopharynx clear.  NECK:  Supple, no jugular venous distention. No thyroid enlargement, no tenderness.  LUNGS: Normal breath sounds bilaterally, no wheezing, rales, rhonchi. No use of accessory muscles of respiration.  CARDIOVASCULAR: S1, S2 normal. No murmurs, rubs, or gallops.  ABDOMEN: Soft, nontender,  nondistended. Bowel sounds present. No organomegaly or mass. FOLEY+ EXTREMITIES: No cyanosis, clubbing or edema b/l.    NEUROLOGIC: non focal PSYCHIATRIC:  patient is alert and oriented x 3.  SKIN: No obvious rash, lesion, or ulcer.   LABORATORY PANEL:  CBC Recent Labs  Lab 08/08/21 0411  WBC 13.9*  HGB 8.6*  HCT 25.0*  PLT 206    Chemistries  Recent Labs  Lab 08/07/21 1100 08/08/21 0411  NA 136 139  K 4.2 4.3  CL 102 107  CO2 24 22  GLUCOSE 179* 125*  BUN 48* 49*  CREATININE 2.64* 2.40*  CALCIUM 9.5 8.8*  AST 13*  --   ALT 12  --   ALKPHOS 59  --   BILITOT 0.8  --    Cardiac Enzymes No results for input(s): TROPONINI in the last 168 hours. RADIOLOGY:  No results found. ASSESSMENT AND PLAN:   Adam Chung is a 70 y.o. male with medical history significant for hypertension, CKD 3, non-insulin-dependent diabetes mellitus, presents to the emergency department for chief concerns of abnormal imaging at his primary care clinic.  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 -- Patient was seen by urology Dr. Abner Greenspan-- underwent flexible cystoscope. Patient did have bulbar ureteral structure. There are multiple false passages bed at the true lumen. Foley catheter was placed. Could not advanced the scope. No bladder stones or trabeculation or evidence of any tumors noted on the bladder mucosa that was visible. --  Continue Foley catheter to gravity. -- Continue IV antibiotics -- creat 2.64-- post procedure good urine output-- creatinine 2.4 -- DC IV fluids  UTI -- continue IV antibiotics -- follow urine culture -- white count 13.9  Hypertension -- continue amlodipine -- hold ACE inhibitor due to creatinine elevation  type II diabetes with  CKD-IIIa -- continue sliding scale -- holding home sulfonylureas at this time    Procedures: Flexible cystoscopy Family communication :friend in the room Consults : urology CODE STATUS: full DVT Prophylaxis : heparin Level of care: Med-Surg Status is: Inpatient  Remains inpatient appropriate because:Inpatient level of care appropriate due to severity of illness  Dispo: The patient is from: Home              Anticipated d/c is to: Home              Patient currently is not medically stable to d/c.   Difficult to place patient No        TOTAL TIME TAKING CARE OF THIS PATIENT: 25 minutes.  >50% time spent on counselling and coordination of care  Note: This dictation was prepared with Dragon dictation along with smaller phrase technology. Any transcriptional errors that result from this process are unintentional.  Fritzi Mandes M.D    [facility]   CC: Primary care physician; Rusty Aus, MD Patient ID: Adam Chung, male   DOB: [birth year], 70 y.o.   MRN: [id number]

## 2021-08-09 ENCOUNTER — Inpatient Hospital Stay: Payer: Medicare Other

## 2021-08-09 ENCOUNTER — Inpatient Hospital Stay: Payer: Medicare Other | Admitting: Certified Registered Nurse Anesthetist

## 2021-08-09 ENCOUNTER — Encounter
Admission: EM | Disposition: A | Discharge status: Home / Self Care | Payer: Self-pay | Source: Home / Self Care | Attending: Internal Medicine

## 2021-08-09 DIAGNOSIS — N179 Acute kidney failure, unspecified: Secondary | ICD-10-CM | POA: Diagnosis not present

## 2021-08-09 DIAGNOSIS — N35912 Unspecified bulbous urethral stricture, male: Secondary | ICD-10-CM | POA: Diagnosis not present

## 2021-08-09 DIAGNOSIS — R338 Other retention of urine: Secondary | ICD-10-CM

## 2021-08-09 DIAGNOSIS — L0291 Cutaneous abscess, unspecified: Secondary | ICD-10-CM

## 2021-08-09 DIAGNOSIS — E119 Type 2 diabetes mellitus without complications: Secondary | ICD-10-CM

## 2021-08-09 DIAGNOSIS — N1339 Other hydronephrosis: Secondary | ICD-10-CM | POA: Diagnosis not present

## 2021-08-09 DIAGNOSIS — I10 Essential (primary) hypertension: Secondary | ICD-10-CM

## 2021-08-09 DIAGNOSIS — R3129 Other microscopic hematuria: Secondary | ICD-10-CM | POA: Diagnosis not present

## 2021-08-09 HISTORY — PX: TRANSURETHRAL RESECTION OF PROSTATE: SHX73

## 2021-08-09 HISTORY — PX: CYSTOSCOPY: SHX5120

## 2021-08-09 LAB — CBC WITH DIFFERENTIAL/PLATELET
Abs Immature Granulocytes: 0.04 10*3/uL (ref 0.00–0.07)
Basophils Absolute: 0.1 10*3/uL (ref 0.0–0.1)
Basophils Relative: 1 %
Eosinophils Absolute: 0.2 10*3/uL (ref 0.0–0.5)
Eosinophils Relative: 2 %
HCT: 27.8 % — ABNORMAL LOW (ref 39.0–52.0)
Hemoglobin: 9.7 g/dL — ABNORMAL LOW (ref 13.0–17.0)
Immature Granulocytes: 1 %
Lymphocytes Relative: 10 %
Lymphs Abs: 0.9 10*3/uL (ref 0.7–4.0)
MCH: 31 pg (ref 26.0–34.0)
MCHC: 34.9 g/dL (ref 30.0–36.0)
MCV: 88.8 fL (ref 80.0–100.0)
Monocytes Absolute: 0.7 10*3/uL (ref 0.1–1.0)
Monocytes Relative: 8 %
Neutro Abs: 6.8 10*3/uL (ref 1.7–7.7)
Neutrophils Relative %: 78 %
Platelets: 161 10*3/uL (ref 150–400)
RBC: 3.13 MIL/uL — ABNORMAL LOW (ref 4.22–5.81)
RDW: 12.5 % (ref 11.5–15.5)
WBC: 8.6 10*3/uL (ref 4.0–10.5)
nRBC: 0 % (ref 0.0–0.2)

## 2021-08-09 LAB — BASIC METABOLIC PANEL
Anion gap: 8 (ref 5–15)
BUN: 43 mg/dL — ABNORMAL HIGH (ref 8–23)
CO2: 25 mmol/L (ref 22–32)
Calcium: 8.6 mg/dL — ABNORMAL LOW (ref 8.9–10.3)
Chloride: 106 mmol/L (ref 98–111)
Creatinine, Ser: 2.61 mg/dL — ABNORMAL HIGH (ref 0.61–1.24)
GFR, Estimated: 26 mL/min — ABNORMAL LOW (ref >60)
Glucose, Bld: 143 mg/dL — ABNORMAL HIGH (ref 70–99)
Potassium: 4 mmol/L (ref 3.5–5.1)
Sodium: 139 mmol/L (ref 135–145)

## 2021-08-09 LAB — URINE CULTURE: Culture: NO GROWTH

## 2021-08-09 LAB — GLUCOSE, CAPILLARY
Glucose-Capillary: 137 mg/dL — ABNORMAL HIGH (ref 70–99)
Glucose-Capillary: 145 mg/dL — ABNORMAL HIGH (ref 70–99)
Glucose-Capillary: 117 mg/dL — ABNORMAL HIGH (ref 70–99)
Glucose-Capillary: 442 mg/dL — ABNORMAL HIGH (ref 70–99)
Glucose-Capillary: 358 mg/dL — ABNORMAL HIGH (ref 70–99)

## 2021-08-09 LAB — SURGICAL PCR SCREEN
MRSA, PCR: NEGATIVE
Staphylococcus aureus: NEGATIVE

## 2021-08-09 SURGERY — CYSTOSCOPY
Anesthesia: General | Site: Prostate

## 2021-08-09 MED ORDER — FENTANYL CITRATE (PF) 100 MCG/2ML IJ SOLN
INTRAMUSCULAR | Status: DC | PRN
  Administered 2021-08-09: 25 ug via INTRAVENOUS
  Administered 2021-08-09 (×2): 50 ug via INTRAVENOUS
  Administered 2021-08-09: 25 ug via INTRAVENOUS
  Administered 2021-08-09: 50 ug via INTRAVENOUS

## 2021-08-09 MED ORDER — ACETAMINOPHEN 10 MG/ML IV SOLN
INTRAVENOUS | Status: DC | PRN
  Administered 2021-08-09: 1000 mg via INTRAVENOUS

## 2021-08-09 MED ORDER — PROPOFOL 10 MG/ML IV BOLUS
INTRAVENOUS | Status: DC | PRN
  Administered 2021-08-09: 170 mg via INTRAVENOUS
  Administered 2021-08-09: 30 mg via INTRAVENOUS

## 2021-08-09 MED ORDER — SODIUM CHLORIDE 0.9 % IV SOLN: INTRAVENOUS | Status: DC | PRN

## 2021-08-09 MED ORDER — PHENYLEPHRINE HCL (PRESSORS) 10 MG/ML IV SOLN
INTRAVENOUS | Status: DC | PRN
  Administered 2021-08-09 (×8): 100 ug via INTRAVENOUS

## 2021-08-09 MED ORDER — PROPOFOL 10 MG/ML IV BOLUS
INTRAVENOUS | Status: AC
  Filled 2021-08-09: qty 20

## 2021-08-09 MED ORDER — ONDANSETRON HCL 4 MG/2ML IJ SOLN: 4.0000 mg | Freq: Once | INTRAMUSCULAR | Status: DC | PRN

## 2021-08-09 MED ORDER — INSULIN ASPART 100 UNIT/ML IJ SOLN
20.0000 [IU] | Freq: Once | INTRAMUSCULAR | Status: AC
  Administered 2021-08-09: 20 [IU] via SUBCUTANEOUS
  Filled 2021-08-09: qty 1

## 2021-08-09 MED ORDER — FENTANYL CITRATE (PF) 100 MCG/2ML IJ SOLN
INTRAMUSCULAR | Status: AC
  Filled 2021-08-09: qty 2

## 2021-08-09 MED ORDER — SEVOFLURANE IN SOLN
RESPIRATORY_TRACT | Status: AC
  Filled 2021-08-09: qty 250

## 2021-08-09 MED ORDER — EPHEDRINE 5 MG/ML INJ
INTRAVENOUS | Status: AC
  Filled 2021-08-09: qty 5

## 2021-08-09 MED ORDER — DEXAMETHASONE SODIUM PHOSPHATE 10 MG/ML IJ SOLN
INTRAMUSCULAR | Status: DC | PRN
  Administered 2021-08-09: 10 mg via INTRAVENOUS

## 2021-08-09 MED ORDER — ONDANSETRON HCL 4 MG/2ML IJ SOLN
INTRAMUSCULAR | Status: DC | PRN
  Administered 2021-08-09: 4 mg via INTRAVENOUS

## 2021-08-09 MED ORDER — FENTANYL CITRATE (PF) 100 MCG/2ML IJ SOLN: 25.0000 ug | INTRAMUSCULAR | Status: DC | PRN

## 2021-08-09 MED ORDER — FENTANYL CITRATE (PF) 100 MCG/2ML IJ SOLN
INTRAMUSCULAR | Status: AC
  Administered 2021-08-09: 25 ug via INTRAVENOUS
  Filled 2021-08-09: qty 2

## 2021-08-09 MED ORDER — STERILE WATER FOR IRRIGATION IR SOLN
Status: DC | PRN
  Administered 2021-08-09: 1000 mL

## 2021-08-09 MED ORDER — EPHEDRINE SULFATE 50 MG/ML IJ SOLN
INTRAMUSCULAR | Status: DC | PRN
  Administered 2021-08-09: 2.5 mg via INTRAVENOUS
  Administered 2021-08-09: 5 mg via INTRAVENOUS
  Administered 2021-08-09: 2.5 mg via INTRAVENOUS

## 2021-08-09 MED ORDER — LIDOCAINE HCL (CARDIAC) PF 100 MG/5ML IV SOSY
PREFILLED_SYRINGE | INTRAVENOUS | Status: DC | PRN
  Administered 2021-08-09: 100 mg via INTRAVENOUS

## 2021-08-09 MED ORDER — LEVOFLOXACIN IN D5W 750 MG/150ML IV SOLN
750.0000 mg | INTRAVENOUS | Status: DC
  Administered 2021-08-09: 750 mg via INTRAVENOUS
  Filled 2021-08-09 (×2): qty 150

## 2021-08-09 SURGICAL SUPPLY — 32 items
ADAPTER IRRIG TUBE 2 SPIKE SOL (ADAPTER) ×10 IMPLANT
BAG DRAIN CYSTO-URO LG1000N (MISCELLANEOUS) ×5 IMPLANT
BAG URO DRAIN 4000ML (MISCELLANEOUS) ×8 IMPLANT
CATH FOL 2WAY LX 24X30 (CATHETERS) IMPLANT
CATH FOLEY 2W COUNCIL 20FR 5CC (CATHETERS) ×3 IMPLANT
CNTNR SPEC 2.5X3XGRAD LEK (MISCELLANEOUS) ×8
CONT SPEC 4OZ STER OR WHT (MISCELLANEOUS) ×2
CONTAINER SPEC 2.5X3XGRAD LEK (MISCELLANEOUS) ×4 IMPLANT
DRAPE UTILITY 15X26 TOWEL STRL (DRAPES) ×5 IMPLANT
ELECT LOOP 22F BIPOLAR SML (ELECTROSURGICAL)
ELECTRODE LOOP 22F BIPOLAR SML (ELECTROSURGICAL) IMPLANT
GAUZE 4X4 16PLY ~~LOC~~+RFID DBL (SPONGE) ×10 IMPLANT
GLOVE SURG UNDER POLY LF SZ7.5 (GLOVE) ×5 IMPLANT
GOWN STRL REUS W/ TWL LRG LVL3 (GOWN DISPOSABLE) ×8 IMPLANT
GOWN STRL REUS W/ TWL XL LVL3 (GOWN DISPOSABLE) ×4 IMPLANT
GOWN STRL REUS W/TWL LRG LVL3 (GOWN DISPOSABLE) ×2
GOWN STRL REUS W/TWL XL LVL3 (GOWN DISPOSABLE) ×1
GUIDEWIRE STR DUAL SENSOR (WIRE) ×3 IMPLANT
HOLDER FOLEY CATH W/STRAP (MISCELLANEOUS) ×8 IMPLANT
IV NS IRRIG 3000ML ARTHROMATIC (IV SOLUTION) ×30 IMPLANT
KIT TURNOVER CYSTO (KITS) ×5 IMPLANT
LOOP CUT BIPOLAR 24F LRG (ELECTROSURGICAL) ×3 IMPLANT
MANIFOLD NEPTUNE II (INSTRUMENTS) ×5 IMPLANT
PACK CYSTO AR (MISCELLANEOUS) ×5 IMPLANT
SET IRRIG Y TYPE TUR BLADDER L (SET/KITS/TRAYS/PACK) ×5 IMPLANT
SET IRRIGATING DISP (SET/KITS/TRAYS/PACK) ×5 IMPLANT
SYR 10ML LL (SYRINGE) ×3 IMPLANT
SYR TOOMEY 50ML (SYRINGE) ×5 IMPLANT
SYR TOOMEY IRRIG 70ML (MISCELLANEOUS) ×5
SYRINGE TOOMEY IRRIG 70ML (MISCELLANEOUS) ×4 IMPLANT
WATER STERILE IRR 1000ML POUR (IV SOLUTION) ×5 IMPLANT
WATER STERILE IRR 500ML POUR (IV SOLUTION) ×5 IMPLANT

## 2021-08-09 NOTE — Plan of Care (Signed)
  Problem: Clinical Measurements: Goal: Ability to maintain clinical measurements within normal limits will improve Outcome: Progressing Goal: Will remain free from infection Outcome: Progressing Goal: Diagnostic test results will improve Outcome: Progressing Goal: Respiratory complications will improve Outcome: Progressing Goal: Cardiovascular complication will be avoided Outcome: Progressing   Problem: Pain Managment: Goal: General experience of comfort will improve Outcome: Progressing   Pt is involved in and agrees with the plan of care. V/S stable. Had fever last night; tylenol given. No complaints of pain. FC in place; draining well.

## 2021-08-09 NOTE — Progress Notes (Signed)
Pharmacy Antibiotic Note  Adam Chung is a 70 y.o. male admitted on 08/07/2021 with UTI. Pharmacy has been consulted for Levaquin dosing.  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 Plan is for OR with urology for un-roofing of prostate abscess and bilateral ureteral stenting placement. Patient febrile yesterday to Tmax 103.1. Leukocytosis is improving. Urine culture from admission with no growth.  Plan:  Will adjust levofloxacin to 750 mg IV q48h  Pharmacy will continue to follow and adjust dose when warranted.  Height: 5\' 11"  (180.3 cm) Weight: 82 kg (180 lb 12.4 oz) IBW/kg (Calculated) : 75.3  Temp (24hrs), Avg:101.1 F (38.4 C), Min:98.6 F (37 C), Max:103.1 F (39.5 C)  Recent Labs  Lab 08/07/21 1100 08/08/21 0411 08/09/21 0409 08/09/21 0605  WBC 10.7* 13.9*  --  8.6  CREATININE 2.64* 2.40* 2.61*  --      Estimated Creatinine Clearance: 28 mL/min (A) (by C-G formula based on SCr of 2.61 mg/dL (H)).    Allergies  Allergen Reactions   Apple Anaphylaxis   Penicillins Swelling    Antimicrobials this admission: LVQ 9/9 >>  Microbiology results: 9/9 UCx: NG  Thank you for allowing pharmacy to be a part of this patient's care.  Benita Gutter  08/09/2021 11:48 AM

## 2021-08-09 NOTE — Progress Notes (Signed)
[place] Chung at [place] NAME: Adam Chung    MR#:  [id number]  DATE OF BIRTH:  Sep 01, 1951  SUBJECTIVE:  patient came in with abnormal lab results and CT scan is informed by his primary care physician as part of workup for elevated creatinine  patient spike fever 102.6 yesterday evening. He is on IV Levaquin. Blood cultures negative. Currently afebrile. Denies any complaints or abdominal pain. REVIEW OF SYSTEMS:   Review of Systems  Constitutional:  Negative for chills, fever and weight loss.  HENT:  Negative for ear discharge, ear pain and nosebleeds.   Eyes:  Negative for blurred vision, pain and discharge.  Respiratory:  Negative for sputum production, shortness of breath, wheezing and stridor.   Cardiovascular:  Negative for chest pain, palpitations, orthopnea and PND.  Gastrointestinal:  Negative for abdominal pain, diarrhea, nausea and vomiting.  Genitourinary:  Negative for frequency and urgency.  Musculoskeletal:  Negative for back pain and joint pain.  Neurological:  Negative for sensory change, speech change, focal weakness and weakness.  Psychiatric/Behavioral:  Negative for depression and hallucinations. The patient is not nervous/anxious.   Tolerating Diet: NPO Tolerating PT: ambulatory  DRUG ALLERGIES:   Allergies  Allergen Reactions  . Apple Anaphylaxis  . Penicillins Swelling    VITALS:  Blood pressure (!) 153/74, pulse 100, temperature 98.6 F (37 C), resp. rate 16, height 5\' 11"  (1.803 m), weight 82 kg, SpO2 96 %.  PHYSICAL EXAMINATION:   Physical Exam  GENERAL:  70 y.o.-year-old patient lying in the bed with no acute distress.  HEENT: Head atraumatic, normocephalic. Oropharynx and nasopharynx clear.  NECK:  Supple, no jugular venous distention. No thyroid enlargement, no tenderness.  LUNGS: Normal breath sounds bilaterally, no wheezing, rales, rhonchi. No use of accessory muscles of respiration.   CARDIOVASCULAR: S1, S2 normal. No murmurs, rubs, or gallops.  ABDOMEN: Soft, nontender, nondistended. Bowel sounds present. No organomegaly or mass. FOLEY+ EXTREMITIES: No cyanosis, clubbing or edema b/l.    NEUROLOGIC: non focal PSYCHIATRIC:  patient is alert and oriented x 3.  SKIN: No obvious rash, lesion, or ulcer.   LABORATORY PANEL:  CBC Recent Labs  Lab 08/09/21 0605  WBC 8.6  HGB 9.7*  HCT 27.8*  PLT 161     Chemistries  Recent Labs  Lab 08/07/21 1100 08/08/21 0411 08/09/21 0409  NA 136   < > 139  K 4.2   < > 4.0  CL 102   < > 106  CO2 24   < > 25  GLUCOSE 179*   < > 143*  BUN 48*   < > 43*  CREATININE 2.64*   < > 2.61*  CALCIUM 9.5   < > 8.6*  AST 13*  --   --   ALT 12  --   --   ALKPHOS 59  --   --   BILITOT 0.8  --   --    < > = values in this interval not displayed.    Cardiac Enzymes No results for input(s): TROPONINI in the last 168 hours. RADIOLOGY:  CT ABDOMEN PELVIS WO CONTRAST  Result Date: 08/09/2021 CLINICAL DATA:  Draining clear yellow urine.  Hydronephrosis. EXAM: CT ABDOMEN AND PELVIS WITHOUT CONTRAST TECHNIQUE: Multidetector CT imaging of the abdomen and pelvis was performed following the standard protocol without IV contrast. COMPARISON:  August 06, 2021 FINDINGS: Lower chest: Mild atelectasis of bilateral lung bases are noted. The lung bases are otherwise clear. Hepatobiliary:  6 mm hypodensity at liver dome unchanged. Patient status post prior cholecystectomy. Biliary tree is normal. Pancreas: Unremarkable. No pancreatic ductal dilatation or surrounding inflammatory changes. Spleen: Normal in size without focal abnormality. Adrenals/Urinary Tract: Bilateral adrenal glands are normal. Severe bilateral hydronephrosis is identified unchanged. Minimal left kidney cyst is unchanged. The bladder is decompressed with marked diffuse thickened wall with a Foley catheter in place. Stomach/Bowel: Stomach is within normal limits. Appendix appears normal.  No evidence of bowel wall thickening, distention, or inflammatory changes. There is diverticulosis of the colon without diverticulitis. Vascular/Lymphatic: Aortic atherosclerosis. No enlarged abdominal or pelvic lymph nodes. Reproductive: Prostate gland areas unchanged compared prior exam. Other: None. Musculoskeletal: Stable. IMPRESSION: 1. Severe bilateral hydronephrosis unchanged. 2. The bladder is decompressed with marked diffuse thickened wall with a Foley catheter in place. 3. Aortic atherosclerosis. Aortic Atherosclerosis (ICD10-I70.0). Electronically Signed   By: Adam Chung M.D.   On: 08/09/2021 09:18   ASSESSMENT AND PLAN:   Adam Chung is a 70 y.o. male with medical history significant for hypertension, CKD 3, non-insulin-dependent diabetes mellitus, presents to the emergency department for chief concerns of abnormal imaging at his primary care clinic.  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 -- Patient was seen by urology Adam Chung-- underwent flexible cystoscope. 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 There are multiple false passages bed at the true lumen. Foley catheter was placed. Could not advanced the scope. No bladder stones or trabeculation or evidence of any tumors noted on the bladder mucosa that was visible. --there is an abnormal appearance of the prostate with low-density in the prostate and seminal vesicles. Findings are suspicious for a poorly defined fluid collection in this area which could be associated with a dilated prostatic urethra, disruption of the prostatic urethra or even an abscess -- Continue Foley catheter to gravity. -- Continue IV antibiotics with left Levaquin renal dosing -- creat 2.64-- post procedure good urine output--  creatinine 2.4 -- 9/11-- patient spike fever of 102.6. Repeat CT scan abdomen shows bilateral severe hydronephrosis. Given fever and bilateral hydronephrosis patient is undergoing bilateral ureteral stent placement and uproofing of prostate abscess today  UTI -- continue IV antibiotics -- follow urine culture -- white count 13.9  Hypertension -- continue amlodipine -- hold ACE inhibitor due to creatinine elevation  type II diabetes with CKD-IIIa -- continue sliding scale -- holding home sulfonylureas at this time    Procedures: Flexible cystoscopy Family communication :none today. Pt tells me his family is informed Consults : urology CODE STATUS: full DVT Prophylaxis : heparin Level of care: Med-Surg Status is: Inpatient  Remains inpatient appropriate because:Inpatient level of care appropriate due to severity of illness  Dispo: The patient is from: Home              Anticipated d/c is to: Home              Patient currently is not medically stable to d/c.   Difficult to place patient No        TOTAL TIME TAKING CARE OF THIS PATIENT: 25 minutes.  >50% time spent on counselling and coordination of care  Note: This dictation was prepared with Dragon dictation along with smaller phrase technology. Any transcriptional errors that result from this process are unintentional.  Norville Haggard.D  [facility]   CC: Primary care physician; Rusty Aus, MD Patient ID: [name], male   DOB: [birth year], 70 y.o.   MRN: [id number]

## 2021-08-09 NOTE — H&P (View-Only) (Signed)
Day of Surgery Subjective: Febrile overnight to 103.1, now afebrile. Denies perineal pain. Slight discomfort with catheter. No nausea or emesis.  Objective: Vital signs in last 24 hours: Temp:  [98.6 F (37 C)-103.1 F (39.5 C)] 98.6 F (37 C) (09/11 1121) Pulse Rate:  [91-115] 100 (09/11 1121) Resp:  [15-19] 16 (09/11 1121) BP: (119-153)/(61-78) 153/74 (09/11 1121) SpO2:  [94 %-97 %] 96 % (09/11 1121)  Intake/Output from previous day: 09/10 0701 - 09/11 0700 In: 1295.6 [P.O.:840; I.V.:405.6; IV Piggyback:50] Out: 4132 [Urine:3850] Intake/Output this shift: No intake/output data recorded.  Physical Exam:  General: Alert and oriented CV: RRR Lungs: Clear Abdomen: Soft, ND, NT Ext: NT, No erythema  Lab Results: Recent Labs    08/07/21 1100 08/08/21 0411 08/09/21 0605  HGB 9.7* 8.6* 9.7*  HCT 27.4* 25.0* 27.8*   BMET Recent Labs    08/08/21 0411 08/09/21 0409  NA 139 139  K 4.3 4.0  CL 107 106  CO2 22 25  GLUCOSE 125* 143*  BUN 49* 43*  CREATININE 2.40* 2.61*  CALCIUM 8.8* 8.6*     Studies/Results: CT ABDOMEN PELVIS WO CONTRAST  Result Date: 08/09/2021 CLINICAL DATA:  Draining clear yellow urine.  Hydronephrosis. EXAM: CT ABDOMEN AND PELVIS WITHOUT CONTRAST TECHNIQUE: Multidetector CT imaging of the abdomen and pelvis was performed following the standard protocol without IV contrast. COMPARISON:  August 06, 2021 FINDINGS: Lower chest: Mild atelectasis of bilateral lung bases are noted. The lung bases are otherwise clear. Hepatobiliary: 6 mm hypodensity at liver dome unchanged. Patient status post prior cholecystectomy. Biliary tree is normal. Pancreas: Unremarkable. No pancreatic ductal dilatation or surrounding inflammatory changes. Spleen: Normal in size without focal abnormality. Adrenals/Urinary Tract: Bilateral adrenal glands are normal. Severe bilateral hydronephrosis is identified unchanged. Minimal left kidney cyst is unchanged. The bladder is  decompressed with marked diffuse thickened wall with a Foley catheter in place. Stomach/Bowel: Stomach is within normal limits. Appendix appears normal. No evidence of bowel wall thickening, distention, or inflammatory changes. There is diverticulosis of the colon without diverticulitis. Vascular/Lymphatic: Aortic atherosclerosis. No enlarged abdominal or pelvic lymph nodes. Reproductive: Prostate gland areas unchanged compared prior exam. Other: None. Musculoskeletal: Stable. IMPRESSION: 1. Severe bilateral hydronephrosis unchanged. 2. The bladder is decompressed with marked diffuse thickened wall with a Foley catheter in place. 3. Aortic atherosclerosis. Aortic Atherosclerosis (ICD10-I70.0). Electronically Signed   By: Abelardo Diesel M.D.   On: 08/09/2021 09:18   DG OR UROLOGY CYSTO IMAGE ([facility] ONLY)  Result Date: 08/09/2021 There is no interpretation for this exam.  This order is for images obtained during a surgical procedure.  Please See "Surgeries" Tab for more information regarding the procedure.    Assessment/Plan: Urinary retention: CT A/P 08/06/2021 with bilateral severe hydroureteronephrosis to the level of the bladder, distended bladder and dilated prostatic urethra.  S/p bedside cystoscopy with Foley catheter placement over a wire on 08/07/2021 with return of 350 mL clear yellow urine. Bilateral hydronephrosis: Seen on CT A/P 08/06/2021.  Likely related to bladder outlet obstruction from enlarged prostate and bulbar urethral stricture. 3.  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 4.  AKI: Creatinine 2.64 on 08/07/2021 from baseline of less than 1 in 2019. 5.  Possible UTI: Urinalysis with negative nitrite.  Urine culture pending.  He denied dysuria until attempted Foley catheter placement.  CT A/P 08/06/2021 with evidence of low density hypoattenuation in the prostate which I favor I  dilated  prostatic urethra given his absence of symptoms. 6.  Microscopic hematuria: Seen on urinalysis 08/07/2021.  Limited bedside cystoscopy without bladder lesion however this will need to be repeated as an outpatient given debris seen today. 7.  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 Reviewed CT scan with persistent severe bilateral hydronephrosis, persistent low attenuation in prostate suspicious for abscess. Given fever last night, will take to OR for cysto, unroofing of prostate abscess and bilateral ureteral stent placement. Discussed with nursing and patient. Added on.   LOS: 1 day   Matt R. Amel Kitch MD 08/09/2021, 11:49 AM [facility]  Pager: [phone]

## 2021-08-09 NOTE — Progress Notes (Signed)
Day of Surgery Subjective: Febrile overnight to 103.1, now afebrile. Denies perineal pain. Slight discomfort with catheter. No nausea or emesis.  Objective: Vital signs in last 24 hours: Temp:  [98.6 F (37 C)-103.1 F (39.5 C)] 98.6 F (37 C) (09/11 1121) Pulse Rate:  [91-115] 100 (09/11 1121) Resp:  [15-19] 16 (09/11 1121) BP: (119-153)/(61-78) 153/74 (09/11 1121) SpO2:  [94 %-97 %] 96 % (09/11 1121)  Intake/Output from previous day: 09/10 0701 - 09/11 0700 In: 1295.6 [P.O.:840; I.V.:405.6; IV Piggyback:50] Out: 5784 [Urine:3850] Intake/Output this shift: No intake/output data recorded.  Physical Exam:  General: Alert and oriented CV: RRR Lungs: Clear Abdomen: Soft, ND, NT Ext: NT, No erythema  Lab Results: Recent Labs    08/07/21 1100 08/08/21 0411 08/09/21 0605  HGB 9.7* 8.6* 9.7*  HCT 27.4* 25.0* 27.8*   BMET Recent Labs    08/08/21 0411 08/09/21 0409  NA 139 139  K 4.3 4.0  CL 107 106  CO2 22 25  GLUCOSE 125* 143*  BUN 49* 43*  CREATININE 2.40* 2.61*  CALCIUM 8.8* 8.6*     Studies/Results: CT ABDOMEN PELVIS WO CONTRAST  Result Date: 08/09/2021 CLINICAL DATA:  Draining clear yellow urine.  Hydronephrosis. EXAM: CT ABDOMEN AND PELVIS WITHOUT CONTRAST TECHNIQUE: Multidetector CT imaging of the abdomen and pelvis was performed following the standard protocol without IV contrast. COMPARISON:  August 06, 2021 FINDINGS: Lower chest: Mild atelectasis of bilateral lung bases are noted. The lung bases are otherwise clear. Hepatobiliary: 6 mm hypodensity at liver dome unchanged. Patient status post prior cholecystectomy. Biliary tree is normal. Pancreas: Unremarkable. No pancreatic ductal dilatation or surrounding inflammatory changes. Spleen: Normal in size without focal abnormality. Adrenals/Urinary Tract: Bilateral adrenal glands are normal. Severe bilateral hydronephrosis is identified unchanged. Minimal left kidney cyst is unchanged. The bladder is  decompressed with marked diffuse thickened wall with a Foley catheter in place. Stomach/Bowel: Stomach is within normal limits. Appendix appears normal. No evidence of bowel wall thickening, distention, or inflammatory changes. There is diverticulosis of the colon without diverticulitis. Vascular/Lymphatic: Aortic atherosclerosis. No enlarged abdominal or pelvic lymph nodes. Reproductive: Prostate gland areas unchanged compared prior exam. Other: None. Musculoskeletal: Stable. IMPRESSION: 1. Severe bilateral hydronephrosis unchanged. 2. The bladder is decompressed with marked diffuse thickened wall with a Foley catheter in place. 3. Aortic atherosclerosis. Aortic Atherosclerosis (ICD10-I70.0). Electronically Signed   By: Abelardo Diesel M.D.   On: 08/09/2021 09:18   DG OR UROLOGY CYSTO IMAGE ([facility] ONLY)  Result Date: 08/09/2021 There is no interpretation for this exam.  This order is for images obtained during a surgical procedure.  Please See "Surgeries" Tab for more information regarding the procedure.    Assessment/Plan: Urinary retention: CT A/P 08/06/2021 with bilateral severe hydroureteronephrosis to the level of the bladder, distended bladder and dilated prostatic urethra.  S/p bedside cystoscopy with Foley catheter placement over a wire on 08/07/2021 with return of 350 mL clear yellow urine. Bilateral hydronephrosis: Seen on CT A/P 08/06/2021.  Likely related to bladder outlet obstruction from enlarged prostate and bulbar urethral stricture. 3.  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 4.  AKI: Creatinine 2.64 on 08/07/2021 from baseline of less than 1 in 2019. 5.  Possible UTI: Urinalysis with negative nitrite.  Urine culture pending.  He denied dysuria until attempted Foley catheter placement.  CT A/P 08/06/2021 with evidence of low density hypoattenuation in the prostate which I favor I  dilated  prostatic urethra given his absence of symptoms. 6.  Microscopic hematuria: Seen on urinalysis 08/07/2021.  Limited bedside cystoscopy without bladder lesion however this will need to be repeated as an outpatient given debris seen today. 7.  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 Reviewed CT scan with persistent severe bilateral hydronephrosis, persistent low attenuation in prostate suspicious for abscess. Given fever last night, will take to OR for cysto, unroofing of prostate abscess and bilateral ureteral stent placement. Discussed with nursing and patient. Added on.   LOS: 1 day   Matt R. Raysa Bosak MD 08/09/2021, 11:49 AM [facility]  Pager: [phone]

## 2021-08-09 NOTE — Anesthesia Postprocedure Evaluation (Signed)
Anesthesia Post Note  Patient: Adam Chung  Procedure(s) Performed: CYSTOSCOPY WITH INSERTION OF FOLEY CATHETER (Bladder) TRANSURETHRAL RESECTION OF THE PROSTATE (TURP) (Prostate)  Patient location during evaluation: PACU Anesthesia Type: General Level of consciousness: awake and alert Pain management: pain level controlled Vital Signs Assessment: post-procedure vital signs reviewed and stable Respiratory status: spontaneous breathing, nonlabored ventilation, respiratory function stable and patient connected to nasal cannula oxygen Cardiovascular status: blood pressure returned to baseline and stable Postop Assessment: no apparent nausea or vomiting Anesthetic complications: no   No notable events documented.   Last Vitals:  Vitals:   08/09/21 1330 08/09/21 1414  BP: 137/76 135/72  Pulse: 94 93  Resp: 13 17  Temp:  36.8 C  SpO2: 96% 95%    Last Pain:  Vitals:   08/09/21 1414  TempSrc: Oral  PainSc:                  Molli Barrows

## 2021-08-09 NOTE — Progress Notes (Signed)
Pt BS is 442, oncall provider informed. See orders.

## 2021-08-09 NOTE — Transfer of Care (Signed)
Immediate Anesthesia Transfer of Care Note  Patient: Adam Chung  Procedure(s) Performed: CYSTOSCOPY WITH INSERTION OF FOLEY CATHETER (Bladder) TRANSURETHRAL RESECTION OF THE PROSTATE (TURP) (Prostate)  Patient Location: PACU  Anesthesia Type:General  Level of Consciousness: drowsy  Airway & Oxygen Therapy: Patient Spontanous Breathing and Patient connected to face mask oxygen  Post-op Assessment: Report given to RN and Post -op Vital signs reviewed and stable  Post vital signs: Reviewed and stable  Last Vitals:  Vitals Value Taken Time  BP 144/86   Temp    Pulse 100   Resp 9   SpO2 100     Last Pain:  Vitals:   08/09/21 0751  TempSrc: Oral  PainSc:          Complications: No notable events documented.

## 2021-08-09 NOTE — Interval H&P Note (Signed)
History and Physical Interval Note:  08/09/2021 11:51 AM  Adam Chung  has presented today for surgery, with the diagnosis of Prostate abscess.  The various methods of treatment have been discussed with the patient and family. After consideration of risks, benefits and other options for treatment, the patient has consented to  Procedure(s): CYSTOSCOPY WITH STENT PLACEMENT (Bilateral) UNROOFING OF PROSTATE ABSCESS (N/A) as a surgical intervention.  The patient's history has been reviewed, patient examined, no change in status, stable for surgery.  I have reviewed the patient's chart and labs.  Questions were answered to the patient's satisfaction.     Janith Lima

## 2021-08-09 NOTE — Anesthesia Preprocedure Evaluation (Signed)
Anesthesia Evaluation  Patient identified by MRN, date of birth, ID band Patient awake    Reviewed: Allergy & Precautions, H&P , NPO status , Patient's Chart, lab work & pertinent test results, reviewed documented beta blocker date and time   Airway Mallampati: III  TM Distance: >3 FB Neck ROM: full    Dental  (+) Teeth Intact   Pulmonary neg pulmonary ROS, former smoker,    Pulmonary exam normal        Cardiovascular Exercise Tolerance: Good hypertension, On Medications Normal cardiovascular exam Rate:Normal     Neuro/Psych negative neurological ROS  negative psych ROS   GI/Hepatic negative GI ROS, Neg liver ROS,   Endo/Other  negative endocrine ROSdiabetes  Renal/GU Renal disease  negative genitourinary   Musculoskeletal   Abdominal   Peds  Hematology negative hematology ROS (+)   Anesthesia Other Findings   Reproductive/Obstetrics negative OB ROS                             Anesthesia Physical Anesthesia Plan  ASA: 3 and emergent  Anesthesia Plan: General LMA   Post-op Pain Management:    Induction:   PONV Risk Score and Plan: 3  Airway Management Planned:   Additional Equipment:   Intra-op Plan:   Post-operative Plan:   Informed Consent: I have reviewed the patients History and Physical, chart, labs and discussed the procedure including the risks, benefits and alternatives for the proposed anesthesia with the patient or authorized representative who has indicated his/her understanding and acceptance.       Plan Discussed with: CRNA  Anesthesia Plan Comments:         Anesthesia Quick Evaluation

## 2021-08-09 NOTE — Anesthesia Procedure Notes (Signed)
Procedure Name: LMA Insertion Date/Time: 08/09/2021 11:58 AM Performed by: Lily Peer, Izumi Mixon, CRNA Pre-anesthesia Checklist: Patient identified, Emergency Drugs available, Suction available and Patient being monitored Patient Re-evaluated:Patient Re-evaluated prior to induction Oxygen Delivery Method: Circle system utilized Preoxygenation: Pre-oxygenation with 100% oxygen Induction Type: IV induction Ventilation: Mask ventilation without difficulty LMA: LMA inserted LMA Size: 4.0 Tube type: Oral Number of attempts: 1 Placement Confirmation: ETT inserted through vocal cords under direct vision, positive ETCO2 and breath sounds checked- equal and bilateral Tube secured with: Tape Dental Injury: Teeth and Oropharynx as per pre-operative assessment

## 2021-08-09 NOTE — Op Note (Addendum)
Operative Note  Preoperative diagnosis:  1.  Bilateral hydronephrosis 2. Bulbar urethral stricture 3. History of LG Ta UCB 4. Possible prostatic abscess  Postoperative diagnosis: 1.  Bulbar urethral stricture 2. Prostatic cavity with infectious rind and minimal purulence 3. 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  TURBT medium 2. Foley catheter placement over a wire  Surgeon: Rexene Alberts, MD  Assistants:  None  Anesthesia:  General  Complications:  None  EBL:  Minimal  Specimens: 1.  ID Type Source Tests Collected by Time Destination  1 : Necrotic Bladder Debris Tissue PATH GU tumor resection SURGICAL PATHOLOGY Janith Lima, MD 08/09/2021 1216   2 : Trigone bladder biopsy Tissue PATH GU tumor resection SURGICAL PATHOLOGY Janith Lima, MD 08/09/2021 1229   A : Necrotic Bladder Debris Tissue PATH GU tumor resection AEROBIC/ANAEROBIC CULTURE W GRAM STAIN (SURGICAL/DEEP WOUND) Janith Lima, MD 08/09/2021 1218    Drains/Catheters: 1.  20 Fr council foley catheter  Intraoperative findings:   Cystourethroscopy demonstrated evidence of prior bulbar urethral stricture.  Proximal bulbar urethra extending into prostatic urethra demonstrated necrotic infectious debris.  There is no normal mucosa identified.  There is evidence of a prior false passage in the bulbar urethra from attempted Foley catheter placement.  Into the prostate, he had a huge cavity at the 6:00 location that had evidence of necrotic infectious debris lining the entire cavity.  There is minimal purulence is expressed from this cavity.  The true lumen was anterior at the 6 o'clock position.  Upon immediate visualization of the bladder at the bladder neck, the entire bladder is covered with necrotic infectious rind.  There is debris present.  I am unable to visualize either ureteral orifice.  I resected approximately 4 cm of this necrotic infectious rind which is likely  malignant over the trigone and in the expected locations of the bilateral ureteral orifices with inability to discover the ureteral orifices.  The lesion is not papillary however is nodular throughout the bladder.  I resected deep into the expected level of the muscle.  Excellent hemostasis was obtained. Representative photos were uploaded into the media tab.  Number of tumors:         Diffuse Size of largest tumor:    10cm  Characteristics of tumors:     Papillary no  Nodular  Yes  Recurrent  Yes      Primary   No  Suspicious for Carcinoma in situ:   No  Clinical tumor stage:         cT3    Bimanual exam under anesthesia:      Yes - evidence of nodule on the right aspect of the prostate, with manipulation, able to express minimal amount of purulence  Visually complete resection:              No  Visualization of detrusor muscle in resection base:      Yes  Visual evaluation for perforation:            Evaluate of perforation was performed with no evidence of perforation   Indication:  Adam Chung is a 70 y.o. male with a remote history of low-grade TA urothelial carcinoma the bladder diagnosed in 2015 by Dr. Jacqlyn Larsen it was lost to follow-up and presented to the ED given concerning CT finding results on 08/06/2021 with bilateral severe hydroureteronephrosis to the level of the bladder, distended bladder and dilated prostatic urethra.  He underwent bedside  cystoscopy placement on 08/07/2021 with return of 350 mL clear yellow urine.  Repeat CT A/P 08/09/2021 demonstrated persistent severe bilateral hydroureteronephrosis as well as lucency within the prostate.  Given concern for underlying prostatic abscess, he was taken the operating room for cystoscopy, unroofing of prostatic abscess and bilateral ureteral stent placement. All the risks, benefits were discussed with the patient to include but not limited to infection, pain, bleeding, damage to adjacent structures, need for further operations,  adverse reaction to anesthesia and death.  Patient understands these risks and agrees to proceed with the operation as planned.    Description of procedure: After informed consent was obtained from the patient, the patient was taken to the operating room. General anesthesia was administered. The patient was placed in dorsal lithotomy position and prepped and draped in usual sterile fashion. Sequential compression devices were applied to lower extremities at the beginning of the case for DVT prophylaxis. Antibiotics were infused prior to surgery start time. A surgical time-out was performed to properly identify the patient, the surgery to be performed, and the surgical site.  I used [employer] sounds to dilate his meatus from 16 [place] to 28 [place].   We then passed the 21-French rigid cystoscope down the urethra and into the bladder under direct vision without any difficulty.  Upon entering the bulbar urethra, it was evident that he had a prior false passage from prior attempted Foley catheter placement.  Entire bulbar urethra was encompassed within an infectious necrotic appearing rind.  I was able to bypass his urethra and into the prostate.  At the 6 o'clock position of the prostate, there is a huge open cavity.  The lining of this cavity was filled with infectious necrotic appearing rind.  There was only minimal purulence which went I performed a rectal exam some of this debris was drained.  As the entire floor of the prostate was already open, I did not feel there is a need for unroofing this area.  I then navigated into the bladder neck and upon immediate view of the bladder, the entire lining of the bladder demonstrated a tan necrotic debris.  I initially thought that this was infectious however as I cannot identify the ureteral orifices, I then exchanged for a resectoscope and began resecting the trigone of the bladder.  The tissue was nodular and concerning for malignancy.  Although no papillary lesion  was identified, he had diffuse necrotic unhealthy tissue throughout his bladder.  I resected and both expected locations of his bilateral ureteral orifices with inability to discover either orifice.  After ensuring adequate tissue for pathology evaluation and confirming excellent hemostasis I withdrew the resectoscope leaving a sensor wire in place in the bladder.  Over the sensor wire, I passed a 20 [place] catheter into the bladder and instilled 10 mL sterile water.  The patient tolerated the procedure well with no complication and was awoken from anesthesia and taken to recovery in stable condition.     Plan: We will leave Foley catheter to gravity.  I will consult interventional radiology for bilateral nephrostomy tube placement to be done tomorrow.  Follow-up pathology given concern for malignancy.  Continue antibiotics.  Matt R. [facility]  Pager: [phone]

## 2021-08-09 NOTE — Progress Notes (Signed)
Events noted last night. Reviewed CT scan with persistent severe bilateral hydronephrosis, persistent low attenuation in prostate suspicious for abscess. Given fever last night, will take to OR for cysto, unroofing of prostate abscess and bilateral ureteral stent placement. Discussed with nursing and patient. Added on.  Matt R. [facility]  Pager: [phone]

## 2021-08-10 ENCOUNTER — Encounter: Payer: Self-pay | Admitting: Urology

## 2021-08-10 DIAGNOSIS — N179 Acute kidney failure, unspecified: Secondary | ICD-10-CM | POA: Diagnosis not present

## 2021-08-10 DIAGNOSIS — N35912 Unspecified bulbous urethral stricture, male: Secondary | ICD-10-CM | POA: Diagnosis not present

## 2021-08-10 DIAGNOSIS — N1339 Other hydronephrosis: Secondary | ICD-10-CM | POA: Diagnosis not present

## 2021-08-10 DIAGNOSIS — Z8551 Personal history of malignant neoplasm of bladder: Secondary | ICD-10-CM | POA: Diagnosis not present

## 2021-08-10 LAB — GLUCOSE, CAPILLARY
Glucose-Capillary: 394 mg/dL — ABNORMAL HIGH (ref 70–99)
Glucose-Capillary: 219 mg/dL — ABNORMAL HIGH (ref 70–99)
Glucose-Capillary: 204 mg/dL — ABNORMAL HIGH (ref 70–99)
Glucose-Capillary: 185 mg/dL — ABNORMAL HIGH (ref 70–99)
Glucose-Capillary: 208 mg/dL — ABNORMAL HIGH (ref 70–99)

## 2021-08-10 LAB — BASIC METABOLIC PANEL
Anion gap: 7 (ref 5–15)
BUN: 48 mg/dL — ABNORMAL HIGH (ref 8–23)
CO2: 23 mmol/L (ref 22–32)
Calcium: 8.4 mg/dL — ABNORMAL LOW (ref 8.9–10.3)
Chloride: 106 mmol/L (ref 98–111)
Creatinine, Ser: 2.26 mg/dL — ABNORMAL HIGH (ref 0.61–1.24)
GFR, Estimated: 30 mL/min — ABNORMAL LOW (ref >60)
Glucose, Bld: 193 mg/dL — ABNORMAL HIGH (ref 70–99)
Potassium: 4.8 mmol/L (ref 3.5–5.1)
Sodium: 136 mmol/L (ref 135–145)

## 2021-08-10 LAB — CBC WITH DIFFERENTIAL/PLATELET
Abs Immature Granulocytes: 0.05 10*3/uL (ref 0.00–0.07)
Basophils Absolute: 0 10*3/uL (ref 0.0–0.1)
Basophils Relative: 0 %
Eosinophils Absolute: 0 10*3/uL (ref 0.0–0.5)
Eosinophils Relative: 0 %
HCT: 26 % — ABNORMAL LOW (ref 39.0–52.0)
Hemoglobin: 9 g/dL — ABNORMAL LOW (ref 13.0–17.0)
Immature Granulocytes: 1 %
Lymphocytes Relative: 8 %
Lymphs Abs: 0.6 10*3/uL — ABNORMAL LOW (ref 0.7–4.0)
MCH: 30.1 pg (ref 26.0–34.0)
MCHC: 34.6 g/dL (ref 30.0–36.0)
MCV: 87 fL (ref 80.0–100.0)
Monocytes Absolute: 0.6 10*3/uL (ref 0.1–1.0)
Monocytes Relative: 7 %
Neutro Abs: 6.9 10*3/uL (ref 1.7–7.7)
Neutrophils Relative %: 84 %
Platelets: 180 10*3/uL (ref 150–400)
RBC: 2.99 MIL/uL — ABNORMAL LOW (ref 4.22–5.81)
RDW: 12 % (ref 11.5–15.5)
WBC: 8.2 10*3/uL (ref 4.0–10.5)
nRBC: 0 % (ref 0.0–0.2)

## 2021-08-10 LAB — PROTIME-INR
INR: 1.2 (ref 0.8–1.2)
Prothrombin Time: 15.5 seconds — ABNORMAL HIGH (ref 11.4–15.2)

## 2021-08-10 NOTE — Progress Notes (Signed)
   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 Jacqlyn Larsen who was lost to follow-up in 2019.  All of those records are not available to me.  The patient has had a few months of worsening urinary symptoms with weak stream, incontinence day and night requiring multiple depends, and worsening renal function with creatinine of 2.3 over the last ~ 1 month from 1.4 about 6 months ago.  Work-up in the ED on 08/07/2021 showed severe bilateral hydronephrosis, distended bladder, abnormal appearing thickened bladder and lucency within the prostate consistent with possible abscess.  He was found to have a urethral stricture, and a catheter was scoped in by Dr. Abner Greenspan at the bedside on Friday afternoon.  He then spiked a fever overnight, and Dr. Abner Greenspan took him to the operating room yesterday for possible unroofing of prostatic abscess, but this showed grossly abnormal appearing prostatic urethra and bladder worrisome for possible malignancy, ureteral orifices not able to be identified, and TURBT of abnormal appearing bladder tissue, and Foley placement over a wire.  Pathology is still pending.  Subjective He denies any complaints today, specifically no back pain, hematuria, lower pelvic pain, fevers   Physical Exam: BP (!) 155/86 (BP Location: Left Arm)   Pulse 90   Temp 98.2 F (36.8 C) (Oral)   Resp 20   Ht 5\' 11"  (1.803 m)   Wt 82 kg   SpO2 97%   BMI 25.21 kg/m    Constitutional:  Alert and oriented, No acute distress. Respiratory: Normal respiratory effort, no increased work of breathing. GI: Abdomen is soft, non-tender, non-distended Drains: Foley with clear yellow urine  Laboratory Data: Reviewed Creatinine 2.26(2.61) WBC 8.2  Pertinent Imaging: I personally viewed and interpreted both CT scans that show abnormal thickened bladder, abnormal appearing prostate, and most recent CT with Foley in appropriate position and persistent hydro  bilaterally  Assessment & Plan:   Complex 70 year old male with history of bladder cancer previously managed by Dr. Jacqlyn Larsen who was lost to follow-up in 2019, and presents with renal failure, bilateral hydroureteronephrosis of unclear etiology, urinary incontinence likely second to overflow incontinence.  He underwent bedside dilation of a urethral stricture on Friday 9/16 and Foley placement, then spiked a fever overnight and was taken to the OR  08/09/21 by Dr. Abner Greenspan for possible unroofing of prostatic abscess.  Operative findings notable for abnormal appearing prostatic fossa and bladder worrisome for malignancy, trigone resected but ureteral orifices not identified.  We discussed his unique presentation at length today and possible etiologies including infection, inflammation, or malignancy.  Pathology is still pending.  We discussed possible need for bilateral nephrostomy tubes, and the patient would like to hold off at this time as he feels well and has no complaints, and creatinine has trended down slightly from yesterday after resection of the trigone.  We discussed next steps in care are primarily pending pathology results.  Trend creatinine, if significant improvement can likely discharge tomorrow with close urology follow-up in 1 week to discuss pathology findings If creatinine remains elevated, re-discuss bilateral nephrostomy tubes tomorrow Case discussed with Dr. Abner Greenspan, Dr. Vernard Gambles, and Dr. Posey Pronto  I spent 45 total minutes of floor time, with greater than 50% spent in counseling and coordination of care with the patient regarding renal failure, possible bladder malignancy, operative findings.   Billey Co, MD

## 2021-08-10 NOTE — Progress Notes (Signed)
Inpatient Diabetes Program Recommendations  AACE/ADA: New Consensus Statement on Inpatient Glycemic Control (2015)  Target Ranges:  Prepandial:   less than 140 mg/dL      Peak postprandial:   less than 180 mg/dL (1-2 hours)      Critically ill patients:  140 - 180 mg/dL   Lab Results  Component Value Date   GLUCAP 204 (H) 08/10/2021   HGBA1C 8.3 (H) 08/07/2021    Review of Glycemic Control Results for Adam Chung, Adam Chung (MRN [id number]) as of 08/10/2021 13:11  Ref. Range 08/09/2021 18:48 08/09/2021 21:00 08/09/2021 23:08 08/10/2021 07:53 08/10/2021 12:31  Glucose-Capillary Latest Ref Range: 70 - 99 mg/dL 394 (H) 442 (H) 358 (H) 219 (H) 204 (H)   Diabetes history: DM 2 Outpatient Diabetes medications: Semaglutide 0.375 mg weekly, Amaryl 4 mg daily Current orders for Inpatient glycemic control:  Novolog sensitive tid with meals and HS  Inpatient Diabetes Program Recommendations:    Note patient received Decadron 10 mg x1 which likely increased blood sugars on 9/11 during surgery.    May consider adding Semglee 8 units daily while in the hospital.   Thanks,  Adah Perl, RN, BC-ADM Inpatient Diabetes Coordinator Pager [phone]  (8a-5p)

## 2021-08-10 NOTE — Progress Notes (Signed)
[name] NAME: Adam Chung    MR#:  [id number]  DATE OF BIRTH:  [birth year]  SUBJECTIVE:  patient came in with abnormal lab results and CT scan is informed by his primary care physician as part of workup for elevated creatinine  patient status post TURBT . Denies any complaints. Family at bedside. No fever REVIEW OF SYSTEMS:   Review of Systems  Constitutional:  Negative for chills, fever and weight loss.  HENT:  Negative for ear discharge, ear pain and nosebleeds.   Eyes:  Negative for blurred vision, pain and discharge.  Respiratory:  Negative for sputum production, shortness of breath, wheezing and stridor.   Cardiovascular:  Negative for chest pain, palpitations, orthopnea and PND.  Gastrointestinal:  Negative for abdominal pain, diarrhea, nausea and vomiting.  Genitourinary:  Negative for frequency and urgency.  Musculoskeletal:  Negative for back pain and joint pain.  Neurological:  Negative for sensory change, speech change, focal weakness and weakness.  Psychiatric/Behavioral:  Negative for depression and hallucinations. The patient is not nervous/anxious.   Tolerating Diet: yes Tolerating PT: ambulatory  DRUG ALLERGIES:   Allergies  Allergen Reactions   Apple Anaphylaxis   Penicillins Swelling    VITALS:  Blood pressure (!) 155/86, pulse 90, temperature 98.2 F (36.8 C), temperature source Oral, resp. rate 20, height 5\' 11"  (1.803 m), weight 82 kg, SpO2 97 %.  PHYSICAL EXAMINATION:   Physical Exam  GENERAL:  70 y.o.-year-old patient lying in the bed with no acute distress.  HEENT: Head atraumatic, normocephalic. Oropharynx and nasopharynx clear.  NECK:  Supple, no jugular venous distention. No thyroid enlargement, no tenderness.  LUNGS: Normal breath sounds bilaterally, no wheezing, rales, rhonchi. No use of accessory muscles of respiration.  CARDIOVASCULAR: S1, S2 normal. No murmurs, rubs, or gallops.   ABDOMEN: Soft, nontender, nondistended. Bowel sounds present. No organomegaly or mass. FOLEY+ EXTREMITIES: No cyanosis, clubbing or edema b/l.    NEUROLOGIC: non focal PSYCHIATRIC:  patient is alert and oriented x 3.  SKIN: No obvious rash, lesion, or ulcer.   LABORATORY PANEL:  CBC Recent Labs  Lab 08/10/21 0406  WBC 8.2  HGB 9.0*  HCT 26.0*  PLT 180     Chemistries  Recent Labs  Lab 08/07/21 1100 08/08/21 0411 08/10/21 0406  NA 136   < > 136  K 4.2   < > 4.8  CL 102   < > 106  CO2 24   < > 23  GLUCOSE 179*   < > 193*  BUN 48*   < > 48*  CREATININE 2.64*   < > 2.26*  CALCIUM 9.5   < > 8.4*  AST 13*  --   --   ALT 12  --   --   ALKPHOS 59  --   --   BILITOT 0.8  --   --    < > = values in this interval not displayed.    Cardiac Enzymes No results for input(s): TROPONINI in the last 168 hours. RADIOLOGY:  CT ABDOMEN PELVIS WO CONTRAST  Result Date: 08/09/2021 CLINICAL DATA:  Draining clear yellow urine.  Hydronephrosis. EXAM: CT ABDOMEN AND PELVIS WITHOUT CONTRAST TECHNIQUE: Multidetector CT imaging of the abdomen and pelvis was performed following the standard protocol without IV contrast. COMPARISON:  August 06, 2021 FINDINGS: Lower chest: Mild atelectasis of bilateral lung bases are noted. The lung bases are otherwise clear. Hepatobiliary: 6 mm hypodensity at liver dome  unchanged. Patient status post prior cholecystectomy. Biliary tree is normal. Pancreas: Unremarkable. No pancreatic ductal dilatation or surrounding inflammatory changes. Spleen: Normal in size without focal abnormality. Adrenals/Urinary Tract: Bilateral adrenal glands are normal. Severe bilateral hydronephrosis is identified unchanged. Minimal left kidney cyst is unchanged. The bladder is decompressed with marked diffuse thickened wall with a Foley catheter in place. Stomach/Bowel: Stomach is within normal limits. Appendix appears normal. No evidence of bowel wall thickening, distention, or  inflammatory changes. There is diverticulosis of the colon without diverticulitis. Vascular/Lymphatic: Aortic atherosclerosis. No enlarged abdominal or pelvic lymph nodes. Reproductive: Prostate gland areas unchanged compared prior exam. Other: None. Musculoskeletal: Stable. IMPRESSION: 1. Severe bilateral hydronephrosis unchanged. 2. The bladder is decompressed with marked diffuse thickened wall with a Foley catheter in place. 3. Aortic atherosclerosis. Aortic Atherosclerosis (ICD10-I70.0). Electronically Signed   By: Abelardo Diesel M.D.   On: 08/09/2021 09:18   DG OR UROLOGY CYSTO IMAGE ([facility] ONLY)  Result Date: 08/09/2021 There is no interpretation for this exam.  This order is for images obtained during a surgical procedure.  Please See "Surgeries" Tab for more information regarding the procedure.   ASSESSMENT AND PLAN:   Adam Chung is a 70 y.o. male with medical history significant for hypertension, CKD 3, non-insulin-dependent diabetes mellitus, presents to the emergency department for chief concerns of abnormal imaging at his primary care clinic.  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 -- Patient was seen by urology Dr. Abner Greenspan-- underwent flexible cystoscope. 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 There are multiple false passages bed at the true lumen. Foley catheter was placed. Could not advanced the scope. No bladder stones or trabeculation or evidence of any tumors noted on the bladder mucosa that was visible. --there is an abnormal appearance of the prostate with low-density in the prostate and seminal vesicles. Findings are suspicious for a poorly defined fluid collection in this area which could be associated with a dilated prostatic urethra, disruption of  the prostatic urethra or even an abscess -- Continue Foley catheter to gravity. -- Continue IV antibiotics with  Levaquin renal dosing -- creat 2.64-- post procedure good urine output-- creatinine 2.4--2.61--s/p TURBT--2.26 -- 9/11-- patient spike fever of 102.6. Repeat CT scan abdomen shows bilateral severe hydronephrosis. Given fever and bilateral hydronephrosis patient is undergoing bilateral ureteral stent placement and uproofing of prostate abscess today --9/12-- status post TURBT findings :Bulbar urethral stricture  Prostatic cavity with infectious rind and minimal purulence  Entire bladder with necrotic rind, infectious and likely malignant  -- creatinine trending down. Patient may need bilateral nephrostomy if creatinine does not show improvement.  UTI -- continue IV antibiotics -- urine culture--no growth -- white count 13.9  Hypertension -- continue amlodipine -- hold ACE inhibitor due to creatinine elevation  type II diabetes with CKD-IIIa -- continue sliding scale -- holding home sulfonylureas at this time    Procedures: Flexible cystoscopy Family communication :none today. Pt tells me his family is informed Consults : urology CODE STATUS: full DVT Prophylaxis : heparin Level of care: Med-Surg Status is: Inpatient  Remains inpatient appropriate because:Inpatient level of care appropriate due to severity of illness  Dispo: The patient is from: Home              Anticipated d/c is to: Home  Patient currently is not medically stable to d/c.   Difficult to place patient No        TOTAL TIME TAKING CARE OF THIS PATIENT: 25 minutes.  >50% time spent on counselling and coordination of care  Note: This dictation was prepared with Dragon dictation along with smaller phrase technology. Any transcriptional errors that result from this process are unintentional.  Fritzi Mandes M.D    [facility]   CC: Primary care physician; Rusty Aus, MD  Patient ID: [name], male   DOB: 1951-08-15, 70 y.o.   MRN: [id number]

## 2021-08-11 ENCOUNTER — Other Ambulatory Visit: Payer: Medicare Other | Admitting: Radiology

## 2021-08-11 DIAGNOSIS — N35912 Unspecified bulbous urethral stricture, male: Secondary | ICD-10-CM | POA: Diagnosis not present

## 2021-08-11 DIAGNOSIS — N179 Acute kidney failure, unspecified: Secondary | ICD-10-CM | POA: Diagnosis not present

## 2021-08-11 DIAGNOSIS — Z8551 Personal history of malignant neoplasm of bladder: Secondary | ICD-10-CM | POA: Diagnosis not present

## 2021-08-11 DIAGNOSIS — N1339 Other hydronephrosis: Secondary | ICD-10-CM | POA: Diagnosis not present

## 2021-08-11 LAB — BASIC METABOLIC PANEL
Anion gap: 9 (ref 5–15)
BUN: 47 mg/dL — ABNORMAL HIGH (ref 8–23)
CO2: 23 mmol/L (ref 22–32)
Calcium: 8.6 mg/dL — ABNORMAL LOW (ref 8.9–10.3)
Chloride: 106 mmol/L (ref 98–111)
Creatinine, Ser: 2.08 mg/dL — ABNORMAL HIGH (ref 0.61–1.24)
GFR, Estimated: 34 mL/min — ABNORMAL LOW (ref >60)
Glucose, Bld: 194 mg/dL — ABNORMAL HIGH (ref 70–99)
Potassium: 4.2 mmol/L (ref 3.5–5.1)
Sodium: 138 mmol/L (ref 135–145)

## 2021-08-11 LAB — GLUCOSE, CAPILLARY: Glucose-Capillary: 206 mg/dL — ABNORMAL HIGH (ref 70–99)

## 2021-08-11 MED ORDER — LEVOFLOXACIN 750 MG PO TABS: 750.0000 mg | ORAL_TABLET | ORAL | 0 refills | Status: DC

## 2021-08-11 MED ORDER — LEVOFLOXACIN 750 MG PO TABS
750.0000 mg | ORAL_TABLET | ORAL | Status: DC
  Administered 2021-08-11: 750 mg via ORAL
  Filled 2021-08-11: qty 1

## 2021-08-11 MED ORDER — AMLODIPINE BESYLATE 5 MG PO TABS: 5.0000 mg | ORAL_TABLET | Freq: Every day | ORAL | 3 refills | Status: DC

## 2021-08-11 NOTE — Discharge Summary (Signed)
Adam Chung at [place] NAME: Adam Chung    MR#:  [id number]  DATE OF BIRTH:  26-Sep-1951  DATE OF ADMISSION:  08/07/2021 ADMITTING PHYSICIAN: Fritzi Mandes, MD  DATE OF DISCHARGE: 08/11/2021  PRIMARY CARE PHYSICIAN: Rusty Aus, MD    ADMISSION DIAGNOSIS:  Abscess [L02.91] Acute renal failure ([place]) [N17.9]  DISCHARGE DIAGNOSIS:  **Acute on chronic kidney disease stage IIIa with severe bilateral hydronephrosis, ureteral structure,Prostate abscess-- status post Foley catheter placement  **Suspected intraoperative findings concerning for recurrent bladder malignancy-- Pat results pending SECONDARY DIAGNOSIS:   Past Medical History:  Diagnosis Date   Cancer ([place])    bladder   Diabetes mellitus without complication [facility])     HOSPITAL COURSE:   Adam Chung is a 70 y.o. male with medical history significant for hypertension, CKD 3, non-insulin-dependent diabetes mellitus, presents to the emergency department for chief concerns of abnormal imaging at his primary care clinic.   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 -- Patient was seen by urology Dr. Abner Greenspan-- underwent flexible cystoscope. 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 There are multiple false passages bed at the true lumen. Foley catheter was placed. Could not advanced the scope. No bladder stones or trabeculation or evidence of any tumors noted on the bladder mucosa that was visible. --there is an abnormal appearance of the prostate with low-density in the prostate and seminal vesicles. Findings are suspicious for a poorly defined fluid collection in this area which could be associated with a dilated prostatic urethra, disruption of the prostatic  urethra or even an abscess -- Continue Foley catheter to gravity. -- Continue IV antibiotics with  Levaquin renal dosing -- creat 2.64-- post procedure good urine output-- creatinine 2.4--2.61--s/p TURBT--2.26 -- 9/11-- patient spike fever of 102.6. Repeat CT scan abdomen shows bilateral severe hydronephrosis. Given fever and bilateral hydronephrosis patient is undergoing bilateral ureteral stent placement and uproofing of prostate abscess today --9/12-- status post TURBT findings :Bulbar urethral stricture  Prostatic cavity with infectious rind and minimal purulence  Entire bladder with necrotic rind, infectious and likely malignant  -- creatinine trending down. Patient may need bilateral nephrostomy if creatinine does not show improvement. --9/13-- patient overall doing well. No pain. Creatinine down to 2.08. Urine output in 24 hours was 5.5 L. Discussed with urology Dr Diamantina Providence-- okay to cancel nephrectomy tube placement. Patient will discharged home with Foley catheter with close outpatient follow-up on biopsy results. Patient will get treated for prostate abscess with every other day Levaquin.   UTI/Prostate abscess -- continue IV antibiotics--change to po levaquin -- urine culture--no growth -- white count 13.9   Hypertension -- continue amlodipine -- hold ACE inhibitor due to creatinine elevation   type II diabetes with CKD-IIIa -- continue sliding scale --resume Insulin and glimipride       Procedures: Flexible cystoscopy Consults : urology CODE STATUS: full DVT Prophylaxis : heparin Level of care: Med-Surg Status is: Inpatient     Dispo: The patient is from: Home              Anticipated d/c is to: Home today              Patient currently is medically stable to d/c.  Difficult to place patient No   CONSULTS OBTAINED:  Treatment Team:  Janith Lima, MD Debroah Loop, PA-C  DRUG ALLERGIES:   Allergies  Allergen Reactions   Apple  Anaphylaxis   Penicillins Swelling    DISCHARGE MEDICATIONS:   Allergies as of 08/11/2021       Reactions   Apple Anaphylaxis   Penicillins Swelling        Medication List     STOP taking these medications    olmesartan 20 MG tablet Commonly known as: BENICAR       TAKE these medications    acetaminophen 650 MG CR tablet Commonly known as: TYLENOL Take 650 mg by mouth as needed for pain.   amLODipine 5 MG tablet Commonly known as: NORVASC Take 1 tablet (5 mg total) by mouth daily.   Cholecalciferol 50 MCG (2000 UT) Tabs Take 3,000 Units by mouth daily.   glimepiride 4 MG tablet Commonly known as: AMARYL Take 4 mg by mouth daily with breakfast.   levofloxacin 750 MG tablet Commonly known as: LEVAQUIN Take 1 tablet (750 mg total) by mouth every other day for 10 days.   Semaglutide(0.25 or 0.5MG /DOS) 2 MG/1.5ML Sopn Inject 0.375 mLs into the skin once a week.   tamsulosin 0.4 MG Caps capsule Commonly known as: FLOMAX Take 0.4 mg by mouth daily.        If you experience worsening of your admission symptoms, develop shortness of breath, life threatening emergency, suicidal or homicidal thoughts you must seek medical attention immediately by calling 911 or calling your MD immediately  if symptoms less severe.  You Must read complete instructions/literature along with all the possible adverse reactions/side effects for all the Medicines you take and that have been prescribed to you. Take any new Medicines after you have completely understood and accept all the possible adverse reactions/side effects.   Please note  You were cared for by a hospitalist during your hospital stay. If you have any questions about your discharge medications or the care you received while you were in the hospital after you are discharged, you can call the unit and asked to speak with the hospitalist on call if the hospitalist that took care of you is not available. Once you are  discharged, your primary care physician will handle any further medical issues. Please note that NO REFILLS for any discharge medications will be authorized once you are discharged, as it is imperative that you return to your primary care physician (or establish a relationship with a primary care physician if you do not have one) for your aftercare needs so that they can reassess your need for medications and monitor your lab values. Today   SUBJECTIVE   doing well. No new complaints.  VITAL SIGNS:  Blood pressure 132/81, pulse 94, temperature 98.4 F (36.9 C), temperature source Oral, resp. rate 18, height 5\' 11"  (1.803 m), weight 82 kg, SpO2 97 %.  I/O:   Intake/Output Summary (Last 24 hours) at 08/11/2021 0904 Last data filed at 08/11/2021 0601 Gross per 24 hour  Intake 480 ml  Output 5000 ml  Net -4520 ml    PHYSICAL EXAMINATION:  GENERAL:  70 y.o.-year-old patient lying in the bed with no acute distress.  LUNGS: Normal breath sounds bilaterally, no wheezing, rales,rhonchi or crepitation. No use of accessory muscles of respiration.  CARDIOVASCULAR: S1, S2 normal. No murmurs, rubs, or gallops.  ABDOMEN: Soft, non-tender, non-distended. Bowel sounds present. No organomegaly or mass. FOLEY+ EXTREMITIES: No pedal  edema, cyanosis, or clubbing.  NEUROLOGIC: non focal PSYCHIATRIC: patient is alert and oriented x 3.  SKIN: No obvious rash, lesion, or ulcer.   DATA REVIEW:   CBC  Recent Labs  Lab 08/10/21 0406  WBC 8.2  HGB 9.0*  HCT 26.0*  PLT 180    Chemistries  Recent Labs  Lab 08/07/21 1100 08/08/21 0411 08/11/21 0531  NA 136   < > 138  K 4.2   < > 4.2  CL 102   < > 106  CO2 24   < > 23  GLUCOSE 179*   < > 194*  BUN 48*   < > 47*  CREATININE 2.64*   < > 2.08*  CALCIUM 9.5   < > 8.6*  AST 13*  --   --   ALT 12  --   --   ALKPHOS 59  --   --   BILITOT 0.8  --   --    < > = values in this interval not displayed.    Microbiology Results   Recent Results  (from the past 240 hour(s))  Urine Culture     Status: None   Collection Time: 08/07/21 11:29 AM   Specimen: Urine, Clean Catch  Result Value Ref Range Status   Specimen Description   Final    URINE, CLEAN CATCH Performed at [facility], [address] [place], [place] [address]    Special Requests   Final    NONE Performed at [facility], [address], [place], [place] [address]    Culture   Final    NO GROWTH Performed at [facility], [place] [address] [place], [place] [address]    Report Status 08/09/2021 FINAL  Final  Resp Panel by RT-PCR (Flu A&B, Covid) Nasopharyngeal Swab     Status: None   Collection Time: 08/07/21 12:00 PM   Specimen: Nasopharyngeal Swab; Nasopharyngeal(NP) swabs in vial transport medium  Result Value Ref Range Status   SARS Coronavirus 2 by RT PCR NEGATIVE NEGATIVE Final    Comment: (NOTE) SARS-CoV-2 target nucleic acids are NOT DETECTED.  The SARS-CoV-2 RNA is generally detectable in upper respiratory specimens during the acute phase of infection. The lowest concentration of SARS-CoV-2 viral copies this assay can detect is 138 copies/mL. A negative result does not preclude SARS-Cov-2 infection and should not be used as the sole basis for treatment or other patient management decisions. A negative result may occur with  improper specimen collection/handling, submission of specimen other than nasopharyngeal swab, presence of viral mutation(s) within the areas targeted by this assay, and inadequate number of viral copies(<138 copies/mL). A negative result must be combined with clinical observations, patient history, and epidemiological information. The expected result is Negative.  Fact Sheet for Patients:  EntrepreneurPulse.com.au  Fact Sheet for Healthcare Providers:  IncredibleEmployment.be  This test is no t yet approved or cleared by the [place] FDA and  has been  authorized for detection and/or diagnosis of SARS-CoV-2 by FDA under an Emergency Use Authorization (EUA). This EUA will remain  in effect (meaning this test can be used) for the duration of the COVID-19 declaration under Section 564(b)(1) of the Act, 21 U.S.C.section 360bbb-3(b)(1), unless the authorization is terminated  or revoked sooner.       Influenza A by PCR NEGATIVE NEGATIVE Final   Influenza B by PCR NEGATIVE NEGATIVE Final    Comment: (NOTE) The Xpert Xpress SARS-CoV-2/FLU/RSV plus assay is intended as an aid  in the diagnosis of influenza from Nasopharyngeal swab specimens and should not be used as a sole basis for treatment. Nasal washings and aspirates are unacceptable for Xpert Xpress SARS-CoV-2/FLU/RSV testing.  Fact Sheet for Patients: EntrepreneurPulse.com.au  Fact Sheet for Healthcare Providers: IncredibleEmployment.be  This test is not yet approved or cleared by the [place] FDA and has been authorized for detection and/or diagnosis of SARS-CoV-2 by FDA under an Emergency Use Authorization (EUA). This EUA will remain in effect (meaning this test can be used) for the duration of the COVID-19 declaration under Section 564(b)(1) of the Act, 21 U.S.C. section 360bbb-3(b)(1), unless the authorization is terminated or revoked.  Performed at [facility], [address] [place], [place] [address]   Surgical pcr screen     Status: None   Collection Time: 08/09/21 10:30 AM   Specimen: Nasal Mucosa; Nasal Swab  Result Value Ref Range Status   MRSA, PCR NEGATIVE NEGATIVE Final   Staphylococcus aureus NEGATIVE NEGATIVE Final    Comment: (NOTE) The Xpert SA Assay (FDA approved for NASAL specimens in patients 78 years of age and older), is one component of a comprehensive surveillance program. It is not intended to diagnose infection nor to guide or monitor treatment. Performed at [facility], [address] [place], [place] [address]   Aerobic/Anaerobic Culture w Gram Stain (surgical/deep wound)     Status: None (Preliminary result)   Collection Time: 08/09/21 12:18 PM   Specimen: PATH GU tumor resection; Tissue  Result Value Ref Range Status   Specimen Description   Final    BLADDER GU TUMOR Performed at [facility], [address] [place], [place] [address]    Special Requests   Final    NONE Performed at [facility], [address] [place], [place] [address]    Gram Stain   Final    NO SQUAMOUS EPITHELIAL CELLS SEEN NO WBC SEEN NO ORGANISMS SEEN    Culture   Final    NO GROWTH < 24 HOURS Performed at [facility], [place] [address], [place],  [address]    Report Status PENDING  Incomplete    RADIOLOGY:  DG OR UROLOGY CYSTO IMAGE ([place])  Result Date: 08/09/2021 There is no interpretation for this exam.  This order is for images obtained during a surgical procedure.  Please See "Surgeries" Tab for more information regarding the procedure.     CODE STATUS:     Code Status Orders  (From admission, onward)           Start     Ordered   08/07/21 1448  Full code  Continuous        08/07/21 1449           Code Status History     This patient has a current code status but no historical code status.        TOTAL TIME TAKING CARE OF THIS PATIENT: 40 minutes.    Fritzi Mandes M.D  [facility]    CC: Primary care physician; Rusty Aus, MD

## 2021-08-11 NOTE — Care Management Important Message (Signed)
Important Message  Patient Details  Name: Adam Chung MRN: [id number] Date of Birth: January 27, 1951   Medicare Important Message Given:  Yes     Dannette Barbara 08/11/2021, 10:44 AM

## 2021-08-11 NOTE — Plan of Care (Signed)
Discharge instructions including medication changes, appointments, and foley care as well as supplies: leg bag, straps, drainage bag and container given for home supply.  Patient verbalized understanding and voiced no concerns.  Foley intact without issues.  Family transporting.

## 2021-08-11 NOTE — Discharge Instructions (Signed)
Foley care per instruction.

## 2021-08-11 NOTE — Progress Notes (Addendum)
Urology Inpatient Progress Note  Subjective: No acute events overnight.  He is afebrile, VSS. Creatinine down today, 2.08.  Urine culture finalized with no growth, Intra-Op culture pending with no growth at <24 hours.  On antibiotics as below.  Surgical pathology pending. Foley catheter in place draining clear, yellow urine. Patient reports feeling well today with no acute concerns.  Anti-infectives: Anti-infectives (From admission, onward)    Start     Dose/Rate Route Frequency Ordered Stop   08/09/21 1800  levofloxacin (LEVAQUIN) IVPB 750 mg        750 mg 100 mL/hr over 90 Minutes Intravenous Every 48 hours 08/09/21 1148     08/08/21 1400  Levofloxacin (LEVAQUIN) IVPB 250 mg  Status:  Discontinued        250 mg 50 mL/hr over 60 Minutes Intravenous Every 24 hours 08/07/21 2347 08/09/21 1148   08/07/21 1345  levofloxacin (LEVAQUIN) IVPB 500 mg        500 mg 100 mL/hr over 60 Minutes Intravenous  Once 08/07/21 1343 08/07/21 1537       Current Facility-Administered Medications  Medication Dose Route Frequency Provider Last Rate Last Admin   amLODipine (NORVASC) tablet 5 mg  5 mg Oral Daily Cox, Amy N, DO   5 mg at 08/10/21 5597   Chlorhexidine Gluconate Cloth 2 % PADS 6 each  6 each Topical Daily Cox, Amy N, DO   6 each at 08/10/21 1032   diltiazem (CARDIZEM) injection 5 mg  5 mg Intravenous Q2H PRN Cox, Amy N, DO       insulin aspart (novoLOG) injection 0-5 Units  0-5 Units Subcutaneous QHS Cox, Amy N, DO   2 Units at 08/10/21 2213   insulin aspart (novoLOG) injection 0-9 Units  0-9 Units Subcutaneous TID WC Cox, Amy N, DO   2 Units at 08/10/21 1725   levofloxacin (LEVAQUIN) IVPB 750 mg  750 mg Intravenous Q48H Benita Gutter, RPH   Stopped at 08/09/21 2023   morphine 2 MG/ML injection 2 mg  2 mg Intravenous Q6H PRN Fritzi Mandes, MD   2 mg at 08/09/21 2313   ondansetron (ZOFRAN) tablet 4 mg  4 mg Oral Q6H PRN Cox, Amy N, DO       Or   ondansetron (ZOFRAN) injection 4 mg  4 mg  Intravenous Q6H PRN Cox, Amy N, DO   4 mg at 08/08/21 0443   tamsulosin (FLOMAX) capsule 0.4 mg  0.4 mg Oral Daily Cox, Amy N, DO   0.4 mg at 08/10/21 0926   Objective: Vital signs in last 24 hours: Temp:  [98 F (36.7 C)-98.9 F (37.2 C)] 98.4 F (36.9 C) (09/13 0814) Pulse Rate:  [87-101] 94 (09/13 0814) Resp:  [18-20] 18 (09/13 0449) BP: (132-152)/(72-81) 132/81 (09/13 0814) SpO2:  [97 %-98 %] 97 % (09/13 0814)  Intake/Output from previous day: 09/12 0701 - 09/13 0700 In: 480 [P.O.:480] Out: 5550 [Urine:5550] Intake/Output this shift: No intake/output data recorded.  Physical Exam Vitals and nursing note reviewed.  Constitutional:      General: He is not in acute distress.    Appearance: He is not ill-appearing, toxic-appearing or diaphoretic.  HENT:     Head: Normocephalic and atraumatic.  Pulmonary:     Effort: Pulmonary effort is normal. No respiratory distress.  Skin:    General: Skin is warm and dry.  Neurological:     Mental Status: He is alert and oriented to person, place, and time.  Psychiatric:  Mood and Affect: Mood normal.        Behavior: Behavior normal.   Lab Results:  Recent Labs    08/09/21 0605 08/10/21 0406  WBC 8.6 8.2  HGB 9.7* 9.0*  HCT 27.8* 26.0*  PLT 161 180   BMET Recent Labs    08/10/21 0406 08/11/21 0531  NA 136 138  K 4.8 4.2  CL 106 106  CO2 23 23  GLUCOSE 193* 194*  BUN 48* 47*  CREATININE 2.26* 2.08*  CALCIUM 8.4* 8.6*   PT/INR Recent Labs    08/10/21 0406  LABPROT 15.5*  INR 1.2   Assessment & Plan: 70 year old male with PMH bladder cancer admitted with acute renal failure, bilateral hydronephrosis, urethral stricture, and prostatic abscess s/p bedside dilation and OR unroofing with intraoperative findings concerning for bladder malignancy, surgical pathology pending.  With downtrending creatinine, will continue to defer plans for bilateral nephrostomy tubes.  Okay for discharge today from the urologic  perspective.  Recommend 7-10 days of culture appropriate antibiotics for management of prostatic abscess despite negative urine cultures.  He will require close outpatient follow-up with Dr. Diamantina Providence to discuss surgical pathology and recheck his renal function.  We will keep his Foley catheter in place in the meantime for maximum urinary decompression.  I discussed the above with the patient, who is in agreement with the plan.  Debroah Loop, PA-C 08/11/2021

## 2021-08-12 ENCOUNTER — Telehealth: Payer: Self-pay

## 2021-08-12 DIAGNOSIS — R338 Other retention of urine: Secondary | ICD-10-CM

## 2021-08-12 LAB — SURGICAL PATHOLOGY

## 2021-08-12 NOTE — Telephone Encounter (Signed)
-----   Message from Billey Co, MD sent at 08/12/2021  8:44 AM EDT ----- Regarding: path and follow up plan Great news, there was no cancer seen in the biopsy that Dr. Abner Greenspan performed last weekend.  He should follow-up with me the week of 9/26 through 30th with BMP 1 day prior, and likely foley removal/voiding trial.  Should be a early morning visit with p.m. PVR.  Okay to add at 815 1 day if needed  Nickolas Madrid, MD 08/12/2021

## 2021-08-12 NOTE — Telephone Encounter (Signed)
Called pt informed him of the information below. Pt gave verbal understanding. BMP ordered. Appt scheduled.

## 2021-08-13 ENCOUNTER — Other Ambulatory Visit: Payer: Self-pay

## 2021-08-13 ENCOUNTER — Other Ambulatory Visit: Payer: Self-pay | Admitting: Urology

## 2021-08-13 ENCOUNTER — Telehealth: Payer: Self-pay

## 2021-08-13 DIAGNOSIS — N3080 Other cystitis without hematuria: Secondary | ICD-10-CM

## 2021-08-13 DIAGNOSIS — T50A95A Adverse effect of other bacterial vaccines, initial encounter: Secondary | ICD-10-CM

## 2021-08-13 NOTE — Telephone Encounter (Signed)
-----   Message from Billey Co, MD sent at 08/13/2021  8:16 AM EDT ----- Regarding: follow up I spoke with infectious disease on this patient.  They think his symptoms could be from the prior BCG treatments he received at [facility] in the past.  Please stop the Levaquin antibiotics, he should come in in the next few days for a lab visit for a urine sample for urinalysis and AFB(Acid fast bacilli) culture.  I also placed a referral to infectious disease, they will reach out to him for an appointment  He should have a follow-up with me in 1 to 2 weeks for catheter removal, with BMP a few days before  Nickolas Madrid, MD 08/13/2021

## 2021-08-13 NOTE — Progress Notes (Signed)
Discussed with Dr. Steva Ready of infectious disease.  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  Referral placed to ID, AFB culture and sensitivity ordered, will arrange lab visit for urine drop-off  Follow-up with me in 1 to 2 weeks for Foley removal and voiding trial  Defer antibiotics to ID, they recommended stopping Levaquin  Adam Madrid, MD 08/13/2021

## 2021-08-13 NOTE — Telephone Encounter (Signed)
Called pt informed him of the information below, pt gave verbal understanding. Appt scheduled.

## 2021-08-14 LAB — AEROBIC/ANAEROBIC CULTURE W GRAM STAIN (SURGICAL/DEEP WOUND)
Culture: NO GROWTH
Gram Stain: NONE SEEN

## 2021-08-17 ENCOUNTER — Ambulatory Visit: Payer: Medicare Other

## 2021-08-18 ENCOUNTER — Ambulatory Visit: Payer: Medicare Other | Admitting: *Deleted

## 2021-08-18 ENCOUNTER — Other Ambulatory Visit: Payer: Self-pay

## 2021-08-18 DIAGNOSIS — N3080 Other cystitis without hematuria: Secondary | ICD-10-CM

## 2021-08-18 NOTE — Addendum Note (Signed)
Addended by: Gaspar Cola L on: 08/18/2021 11:07 AM   Modules accepted: Orders

## 2021-08-18 NOTE — Progress Notes (Signed)
Plugged cath and got a urine sample.

## 2021-08-19 LAB — URINALYSIS, COMPLETE
Bilirubin, UA: NEGATIVE
Ketones, UA: NEGATIVE
Nitrite, UA: NEGATIVE
Specific Gravity, UA: 1.015 (ref 1.005–1.030)
Urobilinogen, Ur: 0.2 mg/dL (ref 0.2–1.0)
pH, UA: 5 (ref 5.0–7.5)

## 2021-08-19 LAB — MICROSCOPIC EXAMINATION: WBC, UA: >30 /hpf — AB (ref 0–5)

## 2021-08-20 ENCOUNTER — Other Ambulatory Visit: Payer: Self-pay

## 2021-08-20 ENCOUNTER — Ambulatory Visit: Payer: Medicare Other | Attending: Infectious Diseases | Admitting: Infectious Diseases

## 2021-08-20 ENCOUNTER — Other Ambulatory Visit
Admission: RE | Admit: 2021-08-20 | Discharge: 2021-08-20 | Disposition: A | Discharge status: Home / Self Care | Payer: Medicare Other | Attending: Infectious Diseases | Admitting: Infectious Diseases

## 2021-08-20 VITALS — BP 108/65 | HR 118 | Temp 98.9°F | Resp 16 | Ht 71.0 in | Wt 177.0 lb

## 2021-08-20 DIAGNOSIS — T50A95A Adverse effect of other bacterial vaccines, initial encounter: Secondary | ICD-10-CM | POA: Diagnosis not present

## 2021-08-20 DIAGNOSIS — Z7984 Long term (current) use of oral hypoglycemic drugs: Secondary | ICD-10-CM | POA: Insufficient documentation

## 2021-08-20 DIAGNOSIS — I10 Essential (primary) hypertension: Secondary | ICD-10-CM | POA: Insufficient documentation

## 2021-08-20 DIAGNOSIS — Z79899 Other long term (current) drug therapy: Secondary | ICD-10-CM | POA: Insufficient documentation

## 2021-08-20 DIAGNOSIS — N3080 Other cystitis without hematuria: Secondary | ICD-10-CM | POA: Insufficient documentation

## 2021-08-20 DIAGNOSIS — Z87891 Personal history of nicotine dependence: Secondary | ICD-10-CM | POA: Diagnosis not present

## 2021-08-20 DIAGNOSIS — Z8551 Personal history of malignant neoplasm of bladder: Secondary | ICD-10-CM | POA: Diagnosis not present

## 2021-08-20 DIAGNOSIS — D649 Anemia, unspecified: Secondary | ICD-10-CM | POA: Diagnosis not present

## 2021-08-20 DIAGNOSIS — R42 Dizziness and giddiness: Secondary | ICD-10-CM | POA: Insufficient documentation

## 2021-08-20 DIAGNOSIS — Z1159 Encounter for screening for other viral diseases: Secondary | ICD-10-CM | POA: Insufficient documentation

## 2021-08-20 DIAGNOSIS — E119 Type 2 diabetes mellitus without complications: Secondary | ICD-10-CM | POA: Diagnosis not present

## 2021-08-20 DIAGNOSIS — Z880 Allergy status to penicillin: Secondary | ICD-10-CM | POA: Diagnosis not present

## 2021-08-20 LAB — HEPATITIS PANEL, ACUTE
HCV Ab: NONREACTIVE
Hep A IgM: NONREACTIVE
Hep B C IgM: NONREACTIVE
Hepatitis B Surface Ag: NONREACTIVE

## 2021-08-20 MED ORDER — CIPROFLOXACIN HCL 500 MG PO TABS: 500.0000 mg | ORAL_TABLET | Freq: Two times a day (BID) | ORAL | 1 refills | Status: DC

## 2021-08-20 NOTE — Progress Notes (Signed)
NAME: Adam Chung  DOB: Mar 17, 1951  MRN: [id number]  Date/Time: 08/20/2021 9:14 AM  REQUESTING PROVIDER: Dr.Sninsky Subjective:  REASON FOR CONSULT: BCG complication of the bladder ? Adam Chung is a 70 y.o. male with a history of Dm, HTN, bladder ca for which he was followed [facility] and had been treated with local BCG, then gemcitabine 2014-2019 is referred to me because of AFB in the bladder biopsy PT says in Aug he became incontinent and had to wear pads- on 8/11 labs were sent and UA had 4-10 wbc, creatinine was 2.3 ( baseline was 1.4)  , wbc was 8.1 . He saw his PCP Dr.Miller on 8/18 He was given a 7 day course of ciprofloxacin and CT scan was ordered and was done on 08/06/21. Moderate to severe bilateral hydroureteronephrosis likely secondary to bladder pathology.2. Bladder is moderately distended with diffuse wall thickening and surrounding inflammatory changes. In addition, there is an abnormal appearance of the prostate with low-density in the prostate and seminal vesicles. Findings are suspicious for a poorly defined fluid collection in this area which could be associated with a dilated prostatic urethra, disruption of the prostatic urethra or even an abscess. . Pt was admitted to the hospital on 08/07/21. Urine cultre sent and he was placed on levaquin- Foley was placed- was seen by urology and taken for cystoscopy which showed bulbar urethral stricture, Entire bladder was covered with necrotic infectious rind raising concern for malignancy and resection was done of the necrotic rindB/l ureteral orifices was not visualized Bimanual examination of the prostate revealed a nodule on the rt aspect- pt was discharged home on PO levaquin and foley on 08/11/21. He was referred to me by Dr.Sninsky as the pathology reported AFB seen in the necrotic tissue Pt has no fever But overall has low energy Appetite fair Night sweats couple of times No pain abdomen The foley is bothering him He felt  dizzy in my office   Past Medical History:  Diagnosis Date   Cancer ([place])    bladder   Diabetes mellitus without complication ([place])     Past Surgical History:  Procedure Laterality Date   BLADDER SURGERY     CYSTOSCOPY N/A 08/09/2021   Procedure: CYSTOSCOPY WITH INSERTION OF FOLEY CATHETER;  Surgeon: Janith Lima, MD;  Location: [facility] ORS;  Service: Urology;  Laterality: N/A;   fatty tumor excision     TRANSURETHRAL RESECTION OF PROSTATE N/A 08/09/2021   Procedure: TRANSURETHRAL RESECTION OF THE PROSTATE (TURP);  Surgeon: Janith Lima, MD;  Location: [facility] ORS;  Service: Urology;  Laterality: N/A;    Social History   Socioeconomic History   Marital status: Married    Spouse name: Not on file   Number of children: Not on file   Years of education: Not on file   Highest education level: Not on file  Occupational History   Not on file  Tobacco Use   Smoking status: Former   Smokeless tobacco: Never  Substance and Sexual Activity   Alcohol use: Not Currently   Drug use: Not on file   Sexual activity: Not on file  Other Topics Concern   Not on file  Social History Narrative   Not on file   Social Determinants of Health   Financial Resource Strain: Not on file  Food Insecurity: Not on file  Transportation Needs: Not on file  Physical Activity: Not on file  Stress: Not on file  Social Connections: Not on file  Intimate Partner  Violence: Not on file  Family history reviewed- nothing pertinent  Allergies  Allergen Reactions   Apple Anaphylaxis   Penicillins Swelling   I? Current Outpatient Medications  Medication Sig Dispense Refill   acetaminophen (TYLENOL) 650 MG CR tablet Take 650 mg by mouth as needed for pain.     amLODipine (NORVASC) 5 MG tablet Take 1 tablet (5 mg total) by mouth daily. 30 tablet 3   Cholecalciferol 50 MCG (2000 UT) TABS Take 3,000 Units by mouth daily.     glimepiride (AMARYL) 4 MG tablet Take 4 mg by mouth daily with breakfast.      tamsulosin (FLOMAX) 0.4 MG CAPS capsule Take 0.4 mg by mouth daily.     levofloxacin (LEVAQUIN) 750 MG tablet Take 1 tablet (750 mg total) by mouth every other day for 10 days. (Patient not taking: Reported on 08/20/2021) 5 tablet 0   No current facility-administered medications for this visit.     Abtx:  Anti-infectives (From admission, onward)    None       REVIEW OF SYSTEMS:  Const: negative fever, negative chills, + night sweats 10 pound weight loss recently Eyes: negative diplopia or visual changes, negative eye pain ENT: negative coryza, negative sore throat Resp: negative cough, hemoptysis, dyspnea Cards: negative for chest pain, palpitations, lower extremity edema GU: incontinence- now has foley GI: Negative for abdominal pain, diarrhea, bleeding, constipation Skin: negative for rash and pruritus Heme: negative for easy bruising and gum/nose bleeding MS: weak, was dizzy in my office Neurolo:negative for headaches, dizziness, vertigo, memory problems  Psych: negative for feelings of anxiety, depression  Endocrine: negative for thyroid, diabetes Allergy/Immunology- PCN Objective:  VITALS:  BP 108/65   Pulse (!) 118   Temp 98.9 F (37.2 C)   Resp 16   Ht 5\' 11"  (1.803 m)   Wt 177 lb (80.3 kg)   SpO2 98%   BMI 24.69 kg/m   BP on standing was 93/62 PHYSICAL EXAM:  General: Alert, cooperative, no distress, appears stated age. pale Head: Normocephalic, without obvious abnormality, atraumatic. Eyes: Conjunctivae clear, anicteric sclerae. Pupils are equal ENT Nares normal. No drainage or sinus tenderness. Lips, mucosa, and tongue normal. No Thrush Neck: Supple, symmetrical, no adenopathy, thyroid: non tender no carotid bruit and no JVD. Back: No CVA tenderness. Lungs: Clear to auscultation bilaterally. No Wheezing or Rhonchi. No rales. Heart: Regular rate and rhythm, no murmur, rub or gallop. Abdomen: Soft, non-tender,not distended. Bowel sounds normal. No  masses Foley in place Extremities: atraumatic, no cyanosis. No edema. No clubbing Skin: No rashes or lesions. Or bruising Lymph: Cervical, supraclavicular normal. Neurologic: Grossly non-focal Pertinent Labs Lab Results CBC    Component Value Date/Time   WBC 8.2 08/10/2021 0406   RBC 2.99 (L) 08/10/2021 0406   HGB 9.0 (L) 08/10/2021 0406   HCT 26.0 (L) 08/10/2021 0406   PLT 180 08/10/2021 0406   MCV 87.0 08/10/2021 0406   MCH 30.1 08/10/2021 0406   MCHC 34.6 08/10/2021 0406   RDW 12.0 08/10/2021 0406   LYMPHSABS 0.6 (L) 08/10/2021 0406   MONOABS 0.6 08/10/2021 0406   EOSABS 0.0 08/10/2021 0406   BASOSABS 0.0 08/10/2021 0406    CMP Latest Ref Rng & Units 08/11/2021 08/10/2021 08/09/2021  Glucose 70 - 99 mg/dL 194(H) 193(H) 143(H)  BUN 8 - 23 mg/dL 47(H) 48(H) 43(H)  Creatinine 0.61 - 1.24 mg/dL 2.08(H) 2.26(H) 2.61(H)  Sodium 135 - 145 mmol/L 138 136 139  Potassium 3.5 - 5.1 mmol/L 4.2 4.8  4.0  Chloride 98 - 111 mmol/L 106 106 106  CO2 22 - 32 mmol/L 23 23 25   Calcium 8.9 - 10.3 mg/dL 8.6(L) 8.4(L) 8.6(L)  Total Protein 6.5 - 8.1 g/dL - - -  Total Bilirubin 0.3 - 1.2 mg/dL - - -  Alkaline Phos 38 - 126 U/L - - -  AST 15 - 41 U/L - - -  ALT 0 - 44 U/L - - -      Microbiology: Recent Results (from the past 240 hour(s))  Microscopic Examination     Status: Abnormal   Collection Time: 08/18/21  9:08 AM   Urine  Result Value Ref Range Status   WBC, UA >30 (A) 0 - 5 /hpf Final   RBC 11-30 (A) 0 - 2 /hpf Final   Epithelial Cells (non renal) 0-10 0 - 10 /hpf Final   Casts Present (A) None seen /lpf Final   Cast Type Hyaline casts N/A Final   Bacteria, UA Few None seen/Few Final  AFB culture with smear     Status: None (Preliminary result)   Collection Time: 08/18/21  1:31 PM   Specimen: Urine   UR  Result Value Ref Range Status   AFB Specimen Processing Concentration  Final   Acid Fast Smear Negative  Final   Acid Fast Culture Comment  Preliminary    Comment:  Specimen has been received and testing has been initiated.   Biopsy of bladder with HPE NECROTIC DEBRIS.  - EXTREMELY SCANT POORLY PRESERVED TISSUE FRAGMENTS WITHOUT DIAGNOSTIC  FEATURES the stromal inflammatory infiltrate has areas resembling a  granulomatous reaction although no definite granulomas are identified There are abundant large groups of AFB colonizing the surface of the  necrotic fragments.  A rare macrophage shows intracellular AFB. The histologic features are  very unusual, but it basically looks like massive caseous necrosis.   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   IMAGING RESULTS: CT abdomen and pelvis- 9/8 and 9/11 Bilateral hydronephrosis- markedly thickened bladder wall I have personally reviewed the films ? 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 Changes in prostate with concern for nodularity/abscess Bacterial culture has been negative Pt has foley Urine for AFB has been sent Will review slides with pathologist Will send the slide for molecular analysis After reviewing the slide will start him on treatment of mycobacterium bovis ( BCG) infection with INH, rifampin and ethambutol Currently no evidence of systemic infection Will get CXR  Cystitis- likely due to the above- will restart quinolone until specific treatment for BCG is started after reviewing slide- gave patient information on the three meds- INH, ethambutol and rifampin   Anemia- Hb around 9-10 ( used to be 14  )   DM on sulfonylurea  HTN- on amlodpine- losartan was DC Today his BP was on the lower side and he was dizzy ( 93/62) . Asked him to hold amlodipine and reach out to his  PCP   ? ? ___________________________________________________ Discussed with patient,  Informed Dr. Ammie Ferrier office regarding low BP Note:  This document was prepared using Dragon voice recognition software and may include unintentional dictation errors.

## 2021-08-20 NOTE — Patient Instructions (Signed)
You are here to discuss about BCG infection of your bladder which likely is causing all these symptoms I will review the pathology slide with the pathologist and ask wehtehr they can send for Molecular study Your urine culture wa snegative for regular bacteria - urine has been sent to check for BCG ( this bactria is similar to TB) and the treatment would be with INH, Rifampin and ethambutol Today I will do additional blood work  Today your BP ( 103/65 and standing 93/62) is low and you are on amlodipine- you are dizzy- recommend holding the amlodipine for 2-3 days and talk to your PCP I am prescribing ciprofloxacin for a short period until we finalize treatment for BCG bladder infection. Do not take the antibiotic with dairy products. Take a probiotic. Check your blood sugar as cipro can cause low blood sugar. I will foloow up with you on a phe visit next week and see you in person in a months time

## 2021-08-21 LAB — CULTURE, URINE COMPREHENSIVE

## 2021-08-21 LAB — HEPATITIS B SURFACE ANTIBODY, QUANTITATIVE: Hep B S AB Quant (Post): <3.1 m[IU]/mL — ABNORMAL LOW (ref >9.9)

## 2021-08-23 LAB — QUANTIFERON-TB GOLD PLUS (RQFGPL)
QuantiFERON Mitogen Value: 1.98 IU/mL
QuantiFERON Nil Value: 0.16 IU/mL
QuantiFERON TB1 Ag Value: 0.17 IU/mL
QuantiFERON TB2 Ag Value: 0.19 IU/mL

## 2021-08-23 LAB — QUANTIFERON-TB GOLD PLUS: QuantiFERON-TB Gold Plus: NEGATIVE

## 2021-08-24 ENCOUNTER — Other Ambulatory Visit: Payer: Self-pay | Admitting: Infectious Diseases

## 2021-08-24 MED ORDER — ETHAMBUTOL HCL 400 MG PO TABS
1200.0000 mg | ORAL_TABLET | Freq: Every day | ORAL | 1 refills | Status: DC
Start: 2021-08-24 — End: 2021-10-10

## 2021-08-24 MED ORDER — RIFAMPIN 300 MG PO CAPS: 600.0000 mg | ORAL_CAPSULE | Freq: Every day | ORAL | 2 refills | Status: DC

## 2021-08-24 MED ORDER — ISONIAZID 300 MG PO TABS: 300.0000 mg | ORAL_TABLET | Freq: Every day | ORAL | 2 refills | Status: DC

## 2021-08-24 MED ORDER — PYRIDOXINE HCL 50 MG PO TABS
50.0000 mg | ORAL_TABLET | Freq: Every day | ORAL | 2 refills | Status: DC
Start: 2021-08-24 — End: 2021-10-10

## 2021-08-26 ENCOUNTER — Ambulatory Visit: Payer: Medicare Other | Admitting: Urology

## 2021-08-26 ENCOUNTER — Encounter: Payer: Self-pay | Admitting: Urology

## 2021-08-26 ENCOUNTER — Ambulatory Visit
Admission: RE | Admit: 2021-08-26 | Discharge: 2021-08-26 | Disposition: A | Discharge status: Home / Self Care | Payer: Medicare Other | Source: Ambulatory Visit | Attending: Infectious Diseases | Admitting: Infectious Diseases

## 2021-08-26 ENCOUNTER — Other Ambulatory Visit: Payer: Self-pay

## 2021-08-26 VITALS — BP 149/82 | HR 116 | Ht 71.0 in | Wt 177.0 lb

## 2021-08-26 DIAGNOSIS — N3080 Other cystitis without hematuria: Secondary | ICD-10-CM

## 2021-08-26 DIAGNOSIS — T50A95A Adverse effect of other bacterial vaccines, initial encounter: Secondary | ICD-10-CM

## 2021-08-26 DIAGNOSIS — N1339 Other hydronephrosis: Secondary | ICD-10-CM

## 2021-08-26 LAB — BLADDER SCAN AMB NON-IMAGING

## 2021-08-26 NOTE — Progress Notes (Signed)
Patient ID: Adam Chung, male   DOB: 1951-08-28, 70 y.o.   MRN: [id number] Catheter Removal  Patient is present today for a catheter removal.  41ml of water was drained from the balloon. A 20FR foley cath was removed from the bladder no complications were noted . Patient tolerated well.  Performed by: Edwin Dada, CMA  Follow up this afternoon

## 2021-08-26 NOTE — Progress Notes (Signed)
   08/26/2021 8:32 AM   Adam Chung 1951-09-01 [id number]  Reason for visit: Follow up AKI, bilateral hydronephrosis, urinary symptoms  HPI: Very complex 70 year old male with history of low-grade bladder cancer treated by Dr. Jacqlyn Larsen with TURBT and BCG who was admitted on 08/07/2021 with worsening urinary symptoms, incontinence overnight, feeling of incomplete emptying.  CT showed bladder wall thickening and a dilated prostatic urethra and severe bilateral hydronephrosis of unclear etiology.  He ultimately underwent cystoscopy and resection of abnormal appearing tissue at the trigone with Dr. 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  Most recent creatinine 1.9, and we discussed the importance of monitoring the kidney function moving forward.  We also discussed bilateral nephrostomy tubes again, and he would like to hold off at this time as he is asymptomatic and see how things improve with the antibiotics prescribed by infectious disease.  I discussed his case with Dr. Steva Ready of infectious disease.  He was seen by infectious disease, and they started treatment with ethambutol, pyridoxine, isoniazid, and rifampin.  Duration TBD.  His Foley was removed this morning, and he returned this afternoon with a PVR of 180 mL.  He has been urinating well during the day with a good stream, and he denies any significant leakage or incontinence.  I recommended continuing timed voiding.  RTC 4 to 6 weeks with PVR, renal ultrasound, BMP prior  I spent 45 total minutes on the day of the encounter including pre-visit review of the medical record, face-to-face time with the patient, and post visit ordering of labs/imaging/tests.  Billey Co, [facility] [address], [place], [place] [address] [phone]

## 2021-08-27 ENCOUNTER — Ambulatory Visit: Payer: Medicare Other | Attending: Infectious Diseases | Admitting: Infectious Diseases

## 2021-08-27 DIAGNOSIS — Z8551 Personal history of malignant neoplasm of bladder: Secondary | ICD-10-CM | POA: Diagnosis not present

## 2021-08-27 DIAGNOSIS — E1122 Type 2 diabetes mellitus with diabetic chronic kidney disease: Secondary | ICD-10-CM | POA: Diagnosis not present

## 2021-08-27 DIAGNOSIS — D649 Anemia, unspecified: Secondary | ICD-10-CM | POA: Insufficient documentation

## 2021-08-27 DIAGNOSIS — N3090 Cystitis, unspecified without hematuria: Secondary | ICD-10-CM | POA: Diagnosis not present

## 2021-08-27 DIAGNOSIS — I129 Hypertensive chronic kidney disease with stage 1 through stage 4 chronic kidney disease, or unspecified chronic kidney disease: Secondary | ICD-10-CM | POA: Diagnosis not present

## 2021-08-27 DIAGNOSIS — N189 Chronic kidney disease, unspecified: Secondary | ICD-10-CM | POA: Insufficient documentation

## 2021-08-27 DIAGNOSIS — T50A95A Adverse effect of other bacterial vaccines, initial encounter: Secondary | ICD-10-CM | POA: Insufficient documentation

## 2021-08-27 DIAGNOSIS — Z79899 Other long term (current) drug therapy: Secondary | ICD-10-CM | POA: Insufficient documentation

## 2021-08-27 DIAGNOSIS — N179 Acute kidney failure, unspecified: Secondary | ICD-10-CM | POA: Diagnosis not present

## 2021-08-27 NOTE — Progress Notes (Signed)
Virtual visit Telephone visit This visit is to go over his medications For bladder infection due to BCG He is starting on INH 300mg , Rifampin 600mg  , ethambutol 1200mg  and pyridoxine 50mg  today Went thru all the side effects of each medicine- GI, liver, skin,  Asked him to go to his opthal for eye exam to get a baseline status of color vision, visual acuity etc Pt had foley removed yesterday at Dr.Snisky's office CXR done yesterday was fine I reviewed the pathology slides with Dr.Rubinas The paraffin bloc/slide was not sent to [facility] molecular lab as it will not identify Mycobacterium bovis but only TB complex (which includes Mb) So as the pathology slide showed Sheets of AFB, and clinical picture was consitent with severe bladder infiltration/infection with BCG causing hydronephrosis it was decided to treat him with the above meds Urine was sent for afb culture and is pending    ? Adam Chung is a 70 y.o. male with a history of Dm, HTN, bladder ca for which he was followed [facility] and had been treated with local BCG, then gemcitabine 2014-2019 is referred to me because of AFB in the bladder biopsy PT says in Aug he became incontinent and had to wear pads- on 8/11 labs were sent and UA had 4-10 wbc, creatinine was 2.3 ( baseline was 1.4)  , wbc was 8.1 . He saw his PCP Dr.Miller on 8/18 He was given a 7 day course of ciprofloxacin and CT scan was ordered and was done on 08/06/21. Moderate to severe bilateral hydroureteronephrosis likely secondary to bladder pathology.2. Bladder is moderately distended with diffuse wall thickening and surrounding inflammatory changes. In addition, there is an abnormal appearance of the prostate with low-density in the prostate and seminal vesicles. Findings are suspicious for a poorly defined fluid collection in this area which could be associated with a dilated prostatic urethra, disruption of the prostatic urethra or even an abscess. . Pt was admitted to the  hospital on 08/07/21. Urine cultre sent and he was placed on levaquin- Foley was placed- was seen by urology and taken for cystoscopy which showed bulbar urethral stricture, Entire bladder was covered with necrotic infectious rind raising concern for malignancy and resection was done of the necrotic rindB/l ureteral orifices was not visualized Bimanual examination of the prostate revealed a nodule on the rt aspect- pt was discharged home on PO levaquin and foley on 08/11/21. He was referred to me by Dr.Sninsky as the pathology reported AFB seen in the necrotic tissue Pt has no fever But overall has low energy Appetite fair Night sweats couple of times No pain abdomen The foley is bothering him He felt dizzy in my office   Past Medical History:  Diagnosis Date   Cancer ([place])    bladder   Diabetes mellitus without complication ([place])     Past Surgical History:  Procedure Laterality Date   BLADDER SURGERY     CYSTOSCOPY N/A 08/09/2021   Procedure: CYSTOSCOPY WITH INSERTION OF FOLEY CATHETER;  Surgeon: Janith Lima, MD;  Location: [facility] ORS;  Service: Urology;  Laterality: N/A;   fatty tumor excision     TRANSURETHRAL RESECTION OF PROSTATE N/A 08/09/2021   Procedure: TRANSURETHRAL RESECTION OF THE PROSTATE (TURP);  Surgeon: Janith Lima, MD;  Location: [facility] ORS;  Service: Urology;  Laterality: N/A;    Social History   Socioeconomic History   Marital status: Married    Spouse name: Not on file   Number of children: Not on  file   Years of education: Not on file   Highest education level: Not on file  Occupational History   Not on file  Tobacco Use   Smoking status: Former   Smokeless tobacco: Never  Substance and Sexual Activity   Alcohol use: Not Currently   Drug use: Not on file   Sexual activity: Not on file  Other Topics Concern   Not on file  Social History Narrative   Not on file   Social Determinants of Health   Financial Resource Strain: Not on file  Food Insecurity:  Not on file  Transportation Needs: Not on file  Physical Activity: Not on file  Stress: Not on file  Social Connections: Not on file  Intimate Partner Violence: Not on file  Family history reviewed- nothing pertinent  Allergies  Allergen Reactions   Apple Anaphylaxis   Penicillins Swelling   I? Current Outpatient Medications  Medication Sig Dispense Refill   acetaminophen (TYLENOL) 650 MG CR tablet Take 650 mg by mouth as needed for pain.     amLODipine (NORVASC) 5 MG tablet Take 1 tablet (5 mg total) by mouth daily. 30 tablet 3   Cholecalciferol 50 MCG (2000 UT) TABS Take 3,000 Units by mouth daily.     glimepiride (AMARYL) 4 MG tablet Take 4 mg by mouth daily with breakfast.     tamsulosin (FLOMAX) 0.4 MG CAPS capsule Take 0.4 mg by mouth daily.     levofloxacin (LEVAQUIN) 750 MG tablet Take 1 tablet (750 mg total) by mouth every other day for 10 days. (Patient not taking: Reported on 08/20/2021) 5 tablet 0   No current facility-administered medications for this visit.     Abtx:  Anti-infectives (From admission, onward)    None       REVIEW OF SYSTEMS:  Const: negative fever, negative chills, + night sweats 10 pound weight loss recently Eyes: negative diplopia or visual changes, negative eye pain ENT: negative coryza, negative sore throat Resp: negative cough, hemoptysis, dyspnea Cards: negative for chest pain, palpitations, lower extremity edema GU: incontinence- now has foley GI: Negative for abdominal pain, diarrhea, bleeding, constipation Skin: negative for rash and pruritus Heme: negative for easy bruising and gum/nose bleeding MS: weak, was dizzy in my office Neurolo:negative for headaches, dizziness, vertigo, memory problems  Psych: negative for feelings of anxiety, depression  Endocrine: negative for thyroid, diabetes  CBC    Component Value Date/Time   WBC 8.2 08/10/2021 0406   RBC 2.99 (L) 08/10/2021 0406   HGB 9.0 (L) 08/10/2021 0406   HCT 26.0 (L)  08/10/2021 0406   PLT 180 08/10/2021 0406   MCV 87.0 08/10/2021 0406   MCH 30.1 08/10/2021 0406   MCHC 34.6 08/10/2021 0406   RDW 12.0 08/10/2021 0406   LYMPHSABS 0.6 (L) 08/10/2021 0406   MONOABS 0.6 08/10/2021 0406   EOSABS 0.0 08/10/2021 0406   BASOSABS 0.0 08/10/2021 0406    CMP Latest Ref Rng & Units 08/11/2021 08/10/2021 08/09/2021  Glucose 70 - 99 mg/dL 194(H) 193(H) 143(H)  BUN 8 - 23 mg/dL 47(H) 48(H) 43(H)  Creatinine 0.61 - 1.24 mg/dL 2.08(H) 2.26(H) 2.61(H)  Sodium 135 - 145 mmol/L 138 136 139  Potassium 3.5 - 5.1 mmol/L 4.2 4.8 4.0  Chloride 98 - 111 mmol/L 106 106 106  CO2 22 - 32 mmol/L 23 23 25   Calcium 8.9 - 10.3 mg/dL 8.6(L) 8.4(L) 8.6(L)  Total Protein 6.5 - 8.1 g/dL - - -  Total Bilirubin 0.3 - 1.2  mg/dL - - -  Alkaline Phos 38 - 126 U/L - - -  AST 15 - 41 U/L - - -  ALT 0 - 44 U/L - - -      Microbiology: Recent Results (from the past 240 hour(s))  Microscopic Examination     Status: Abnormal   Collection Time: 08/18/21  9:08 AM   Urine  Result Value Ref Range Status   WBC, UA >30 (A) 0 - 5 /hpf Final   RBC 11-30 (A) 0 - 2 /hpf Final   Epithelial Cells (non renal) 0-10 0 - 10 /hpf Final   Casts Present (A) None seen /lpf Final   Cast Type Hyaline casts N/A Final   Bacteria, UA Few None seen/Few Final  AFB culture with smear     Status: None (Preliminary result)   Collection Time: 08/18/21  1:31 PM   Specimen: Urine   UR  Result Value Ref Range Status   AFB Specimen Processing Concentration  Final   Acid Fast Smear Negative  Final   Acid Fast Culture Comment  Preliminary    Comment: Specimen has been received and testing has been initiated.   Biopsy of bladder with HPE NECROTIC DEBRIS.  - EXTREMELY SCANT POORLY PRESERVED TISSUE FRAGMENTS WITHOUT DIAGNOSTIC  FEATURES the stromal inflammatory infiltrate has areas resembling a  granulomatous reaction although no definite granulomas are identified There are abundant large groups of AFB colonizing  the surface of the  necrotic fragments.  A rare macrophage shows intracellular AFB. The histologic features are  very unusual, but it basically looks like massive caseous necrosis.   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   IMAGING RESULTS: CT abdomen and pelvis- 9/8 and 9/11 Bilateral hydronephrosis- markedly thickened bladder wall I have personally reviewed the films  Pathology Sheets of AFB seen   ? 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 Changes in prostate concerning for  nodularity/abscess Currently no evidence of systemic infection Urine AFB pending Pathology slide reviewed and shows sheets of AFB Blood culture for AFB sent Quantiferon gold neg CXR neg Will start INH, rifampin and ethambutol today (08/27/21) along with pyridoxine 50mg  Qd ( he needs to buy pyridoxine -OTC)( may need for 9 months- after 2 months ethambutol will be stooped and he will continue with INH and Rifampin) Went thru all the side effects of each medicine- GI, liver, skin,  Asked him to go to his opthal for eye exam to get a baseline status of color vision, visual acuity etc Pt stopped cipro last night  AKI on CKD due to the above   Anemia- Hb around 9-10 ( used to be 14  )   DM on sulfonylurea  HTN- pt will follow with his PCP as BP was low during his last visit and we had to hold amlodipine   ? ? ___________________________________________________ Discussed with patient,  Labs on 09/08/21- follow up 1 month Total time spent on the telephone visit was 14 min  Note:  This document was prepared using Dragon voice recognition software and may include unintentional dictation errors.

## 2021-09-01 ENCOUNTER — Telehealth: Payer: Self-pay

## 2021-09-01 NOTE — Telephone Encounter (Signed)
Called to check on patient. After he verified his DOB he reports that he is taking all medications related to treatment for BCG infection with only mild side effects. He noticed a small rash that lasted less than a day. He has been tired but nothing severe. Denies HA,NVD, or fever at this time. Will follow up in office on 09/24/21 9 am.

## 2021-09-10 ENCOUNTER — Telehealth: Payer: Self-pay | Admitting: Pharmacist

## 2021-09-10 DIAGNOSIS — R110 Nausea: Secondary | ICD-10-CM

## 2021-09-10 MED ORDER — ONDANSETRON 8 MG PO TBDP
8.0000 mg | ORAL_TABLET | Freq: Three times a day (TID) | ORAL | 2 refills | Status: DC | PRN
Start: 2021-09-10 — End: 2022-09-09

## 2021-09-10 NOTE — Telephone Encounter (Signed)
Patient called with some issues/side effects regarding his medications that he is taking for BCG bladder infection (isoniazid, rifampin, ethambutol, vitamin b6). He is experiencing, nausea, lightheadedness, fatigue, hoarseness, weakness, and loss of appetite. He is mainly concerned about the loss of appetite and hoarseness. I went over a few things for him to try and eat to settle his stomach and help. He thinks he would feel better if he ate. I will send in some zofran for him to take. There is a small drug interaction with zofran and rifampin, but fine to take together. Advised him to take 30 minutes before taking other medications. He is asking me to send a note to Dr. Delaine Lame to see what she thinks about his issues (especially the hoarseness). Advised him that [place], but I would send message to her and Dr. Ola Spurr who is covering for her.

## 2021-09-20 ENCOUNTER — Other Ambulatory Visit: Payer: Self-pay

## 2021-09-20 ENCOUNTER — Inpatient Hospital Stay (HOSPITAL_COMMUNITY)
Admission: EM | Admit: 2021-09-20 | Discharge: 2021-09-30 | DRG: 871 | Disposition: A | Discharge status: Skilled Nursing Facility | Payer: Medicare Other | Attending: Internal Medicine | Admitting: Internal Medicine

## 2021-09-20 ENCOUNTER — Emergency Department (HOSPITAL_COMMUNITY): Payer: Medicare Other

## 2021-09-20 ENCOUNTER — Encounter (HOSPITAL_COMMUNITY): Payer: Self-pay

## 2021-09-20 DIAGNOSIS — C679 Malignant neoplasm of bladder, unspecified: Secondary | ICD-10-CM | POA: Diagnosis present

## 2021-09-20 DIAGNOSIS — K5900 Constipation, unspecified: Secondary | ICD-10-CM | POA: Diagnosis present

## 2021-09-20 DIAGNOSIS — N35912 Unspecified bulbous urethral stricture, male: Secondary | ICD-10-CM | POA: Diagnosis present

## 2021-09-20 DIAGNOSIS — E440 Moderate protein-calorie malnutrition: Secondary | ICD-10-CM | POA: Diagnosis not present

## 2021-09-20 DIAGNOSIS — I129 Hypertensive chronic kidney disease with stage 1 through stage 4 chronic kidney disease, or unspecified chronic kidney disease: Secondary | ICD-10-CM | POA: Diagnosis present

## 2021-09-20 DIAGNOSIS — Z9079 Acquired absence of other genital organ(s): Secondary | ICD-10-CM

## 2021-09-20 DIAGNOSIS — D631 Anemia in chronic kidney disease: Secondary | ICD-10-CM | POA: Diagnosis present

## 2021-09-20 DIAGNOSIS — R008 Other abnormalities of heart beat: Secondary | ICD-10-CM | POA: Diagnosis not present

## 2021-09-20 DIAGNOSIS — Z20822 Contact with and (suspected) exposure to covid-19: Secondary | ICD-10-CM | POA: Diagnosis present

## 2021-09-20 DIAGNOSIS — Z79899 Other long term (current) drug therapy: Secondary | ICD-10-CM

## 2021-09-20 DIAGNOSIS — L8990 Pressure ulcer of unspecified site, unspecified stage: Secondary | ICD-10-CM | POA: Diagnosis not present

## 2021-09-20 DIAGNOSIS — K449 Diaphragmatic hernia without obstruction or gangrene: Secondary | ICD-10-CM | POA: Diagnosis present

## 2021-09-20 DIAGNOSIS — E1165 Type 2 diabetes mellitus with hyperglycemia: Secondary | ICD-10-CM | POA: Diagnosis not present

## 2021-09-20 DIAGNOSIS — Z9114 Patient's other noncompliance with medication regimen: Secondary | ICD-10-CM

## 2021-09-20 DIAGNOSIS — L89151 Pressure ulcer of sacral region, stage 1: Secondary | ICD-10-CM | POA: Diagnosis present

## 2021-09-20 DIAGNOSIS — Z9049 Acquired absence of other specified parts of digestive tract: Secondary | ICD-10-CM

## 2021-09-20 DIAGNOSIS — D75838 Other thrombocytosis: Secondary | ICD-10-CM | POA: Diagnosis present

## 2021-09-20 DIAGNOSIS — G9341 Metabolic encephalopathy: Secondary | ICD-10-CM | POA: Diagnosis present

## 2021-09-20 DIAGNOSIS — N184 Chronic kidney disease, stage 4 (severe): Secondary | ICD-10-CM | POA: Diagnosis present

## 2021-09-20 DIAGNOSIS — I493 Ventricular premature depolarization: Secondary | ICD-10-CM | POA: Diagnosis present

## 2021-09-20 DIAGNOSIS — R338 Other retention of urine: Secondary | ICD-10-CM | POA: Diagnosis present

## 2021-09-20 DIAGNOSIS — R933 Abnormal findings on diagnostic imaging of other parts of digestive tract: Secondary | ICD-10-CM | POA: Diagnosis not present

## 2021-09-20 DIAGNOSIS — K222 Esophageal obstruction: Secondary | ICD-10-CM | POA: Diagnosis present

## 2021-09-20 DIAGNOSIS — Z87891 Personal history of nicotine dependence: Secondary | ICD-10-CM

## 2021-09-20 DIAGNOSIS — Z7984 Long term (current) use of oral hypoglycemic drugs: Secondary | ICD-10-CM | POA: Diagnosis not present

## 2021-09-20 DIAGNOSIS — N136 Pyonephrosis: Secondary | ICD-10-CM | POA: Diagnosis present

## 2021-09-20 DIAGNOSIS — I10 Essential (primary) hypertension: Secondary | ICD-10-CM

## 2021-09-20 DIAGNOSIS — E1122 Type 2 diabetes mellitus with diabetic chronic kidney disease: Secondary | ICD-10-CM | POA: Diagnosis present

## 2021-09-20 DIAGNOSIS — D75839 Thrombocytosis, unspecified: Secondary | ICD-10-CM | POA: Diagnosis not present

## 2021-09-20 DIAGNOSIS — E875 Hyperkalemia: Secondary | ICD-10-CM | POA: Diagnosis present

## 2021-09-20 DIAGNOSIS — A419 Sepsis, unspecified organism: Secondary | ICD-10-CM | POA: Diagnosis present

## 2021-09-20 DIAGNOSIS — E860 Dehydration: Secondary | ICD-10-CM | POA: Diagnosis present

## 2021-09-20 DIAGNOSIS — K2970 Gastritis, unspecified, without bleeding: Secondary | ICD-10-CM | POA: Diagnosis present

## 2021-09-20 DIAGNOSIS — D649 Anemia, unspecified: Secondary | ICD-10-CM | POA: Diagnosis not present

## 2021-09-20 DIAGNOSIS — N179 Acute kidney failure, unspecified: Secondary | ICD-10-CM | POA: Diagnosis present

## 2021-09-20 DIAGNOSIS — E1110 Type 2 diabetes mellitus with ketoacidosis without coma: Secondary | ICD-10-CM | POA: Diagnosis present

## 2021-09-20 DIAGNOSIS — R339 Retention of urine, unspecified: Secondary | ICD-10-CM | POA: Diagnosis not present

## 2021-09-20 DIAGNOSIS — L89891 Pressure ulcer of other site, stage 1: Secondary | ICD-10-CM | POA: Diagnosis present

## 2021-09-20 DIAGNOSIS — B9681 Helicobacter pylori [H. pylori] as the cause of diseases classified elsewhere: Secondary | ICD-10-CM | POA: Diagnosis present

## 2021-09-20 LAB — BLOOD GAS, VENOUS
Acid-base deficit: 16.1 mmol/L — ABNORMAL HIGH (ref 0.0–2.0)
Bicarbonate: 11.1 mmol/L — ABNORMAL LOW (ref 20.0–28.0)
O2 Saturation: 99.2 %
Patient temperature: 98.6
pCO2, Ven: 31.5 mmHg — ABNORMAL LOW (ref 44.0–60.0)
pH, Ven: 7.173 — CL (ref 7.250–7.430)
pO2, Ven: 159 mmHg — ABNORMAL HIGH (ref 32.0–45.0)

## 2021-09-20 LAB — URINALYSIS, ROUTINE W REFLEX MICROSCOPIC
Bilirubin Urine: NEGATIVE
Glucose, UA: >=500 mg/dL — AB
Ketones, ur: 5 mg/dL — AB
Nitrite: NEGATIVE
Protein, ur: 30 mg/dL — AB
RBC / HPF: >50 RBC/hpf — ABNORMAL HIGH (ref 0–5)
Specific Gravity, Urine: 1.017 (ref 1.005–1.030)
WBC, UA: >50 WBC/hpf — ABNORMAL HIGH (ref 0–5)
pH: 6 (ref 5.0–8.0)

## 2021-09-20 LAB — CBC WITH DIFFERENTIAL/PLATELET
Abs Immature Granulocytes: 0.23 10*3/uL — ABNORMAL HIGH (ref 0.00–0.07)
Basophils Absolute: 0.1 10*3/uL (ref 0.0–0.1)
Basophils Relative: 0 %
Eosinophils Absolute: 0 10*3/uL (ref 0.0–0.5)
Eosinophils Relative: 0 %
HCT: 34.8 % — ABNORMAL LOW (ref 39.0–52.0)
Hemoglobin: 11.5 g/dL — ABNORMAL LOW (ref 13.0–17.0)
Immature Granulocytes: 1 %
Lymphocytes Relative: 3 %
Lymphs Abs: 0.6 10*3/uL — ABNORMAL LOW (ref 0.7–4.0)
MCH: 29 pg (ref 26.0–34.0)
MCHC: 33 g/dL (ref 30.0–36.0)
MCV: 87.7 fL (ref 80.0–100.0)
Monocytes Absolute: 1.3 10*3/uL — ABNORMAL HIGH (ref 0.1–1.0)
Monocytes Relative: 6 %
Neutro Abs: 19 10*3/uL — ABNORMAL HIGH (ref 1.7–7.7)
Neutrophils Relative %: 90 %
Platelets: 464 10*3/uL — ABNORMAL HIGH (ref 150–400)
RBC: 3.97 MIL/uL — ABNORMAL LOW (ref 4.22–5.81)
RDW: 14.5 % (ref 11.5–15.5)
WBC: 21.2 10*3/uL — ABNORMAL HIGH (ref 4.0–10.5)
nRBC: 0 % (ref 0.0–0.2)

## 2021-09-20 LAB — GLUCOSE, CAPILLARY
Glucose-Capillary: >600 mg/dL (ref 70–99)
Glucose-Capillary: >600 mg/dL (ref 70–99)
Glucose-Capillary: >600 mg/dL (ref 70–99)
Glucose-Capillary: 474 mg/dL — ABNORMAL HIGH (ref 70–99)
Glucose-Capillary: 589 mg/dL (ref 70–99)
Glucose-Capillary: 357 mg/dL — ABNORMAL HIGH (ref 70–99)
Glucose-Capillary: 472 mg/dL — ABNORMAL HIGH (ref 70–99)
Glucose-Capillary: 202 mg/dL — ABNORMAL HIGH (ref 70–99)
Glucose-Capillary: 157 mg/dL — ABNORMAL HIGH (ref 70–99)

## 2021-09-20 LAB — I-STAT CHEM 8, ED
BUN: >130 mg/dL — ABNORMAL HIGH (ref 8–23)
Calcium, Ion: 1.15 mmol/L (ref 1.15–1.40)
Chloride: 86 mmol/L — ABNORMAL LOW (ref 98–111)
Creatinine, Ser: 5.2 mg/dL — ABNORMAL HIGH (ref 0.61–1.24)
Glucose, Bld: >700 mg/dL (ref 70–99)
HCT: 35 % — ABNORMAL LOW (ref 39.0–52.0)
Hemoglobin: 11.9 g/dL — ABNORMAL LOW (ref 13.0–17.0)
Potassium: 6.7 mmol/L (ref 3.5–5.1)
Sodium: 111 mmol/L — CL (ref 135–145)
TCO2: 16 mmol/L — ABNORMAL LOW (ref 22–32)

## 2021-09-20 LAB — CBG MONITORING, ED
Glucose-Capillary: >600 mg/dL (ref 70–99)
Glucose-Capillary: >600 mg/dL (ref 70–99)
Glucose-Capillary: >600 mg/dL (ref 70–99)
Glucose-Capillary: >600 mg/dL (ref 70–99)
Glucose-Capillary: >600 mg/dL (ref 70–99)
Glucose-Capillary: >600 mg/dL (ref 70–99)

## 2021-09-20 LAB — BASIC METABOLIC PANEL
Anion gap: 21 — ABNORMAL HIGH (ref 5–15)
Anion gap: 12 (ref 5–15)
Anion gap: 15 (ref 5–15)
BUN: 162 mg/dL — ABNORMAL HIGH (ref 8–23)
BUN: 156 mg/dL — ABNORMAL HIGH (ref 8–23)
BUN: 146 mg/dL — ABNORMAL HIGH (ref 8–23)
CO2: 12 mmol/L — ABNORMAL LOW (ref 22–32)
CO2: 17 mmol/L — ABNORMAL LOW (ref 22–32)
CO2: 17 mmol/L — ABNORMAL LOW (ref 22–32)
Calcium: 8.8 mg/dL — ABNORMAL LOW (ref 8.9–10.3)
Calcium: 8.3 mg/dL — ABNORMAL LOW (ref 8.9–10.3)
Calcium: 9.4 mg/dL (ref 8.9–10.3)
Chloride: 78 mmol/L — ABNORMAL LOW (ref 98–111)
Chloride: 91 mmol/L — ABNORMAL LOW (ref 98–111)
Chloride: 102 mmol/L (ref 98–111)
Creatinine, Ser: 5.5 mg/dL — ABNORMAL HIGH (ref 0.61–1.24)
Creatinine, Ser: 4.46 mg/dL — ABNORMAL HIGH (ref 0.61–1.24)
Creatinine, Ser: 3.98 mg/dL — ABNORMAL HIGH (ref 0.61–1.24)
GFR, Estimated: 10 mL/min — ABNORMAL LOW (ref >60)
GFR, Estimated: 13 mL/min — ABNORMAL LOW (ref >60)
GFR, Estimated: 15 mL/min — ABNORMAL LOW (ref >60)
Glucose, Bld: 1468 mg/dL (ref 70–99)
Glucose, Bld: 1101 mg/dL (ref 70–99)
Glucose, Bld: 245 mg/dL — ABNORMAL HIGH (ref 70–99)
Potassium: 6.5 mmol/L (ref 3.5–5.1)
Potassium: 5 mmol/L (ref 3.5–5.1)
Potassium: 4.8 mmol/L (ref 3.5–5.1)
Sodium: 111 mmol/L — CL (ref 135–145)
Sodium: 120 mmol/L — ABNORMAL LOW (ref 135–145)
Sodium: 134 mmol/L — ABNORMAL LOW (ref 135–145)

## 2021-09-20 LAB — PHOSPHORUS: Phosphorus: 6.5 mg/dL — ABNORMAL HIGH (ref 2.5–4.6)

## 2021-09-20 LAB — BETA-HYDROXYBUTYRIC ACID: Beta-Hydroxybutyric Acid: 2.99 mmol/L — ABNORMAL HIGH (ref 0.05–0.27)

## 2021-09-20 LAB — RESP PANEL BY RT-PCR (FLU A&B, COVID) ARPGX2
Influenza A by PCR: NEGATIVE
Influenza B by PCR: NEGATIVE
SARS Coronavirus 2 by RT PCR: NEGATIVE

## 2021-09-20 LAB — MAGNESIUM: Magnesium: 3.2 mg/dL — ABNORMAL HIGH (ref 1.7–2.4)

## 2021-09-20 LAB — MRSA NEXT GEN BY PCR, NASAL: MRSA by PCR Next Gen: NOT DETECTED

## 2021-09-20 LAB — PROCALCITONIN: Procalcitonin: 1.2 ng/mL

## 2021-09-20 MED ORDER — METOPROLOL TARTRATE 5 MG/5ML IV SOLN
5.0000 mg | Freq: Three times a day (TID) | INTRAVENOUS | Status: DC
  Administered 2021-09-20 – 2021-09-22 (×6): 5 mg via INTRAVENOUS
  Filled 2021-09-20 (×6): qty 5

## 2021-09-20 MED ORDER — DEXTROSE IN LACTATED RINGERS 5 % IV SOLN
INTRAVENOUS | Status: DC
Start: 2021-09-20 — End: 2021-09-24

## 2021-09-20 MED ORDER — SODIUM CHLORIDE 0.9 % IV SOLN
1.0000 g | Freq: Three times a day (TID) | INTRAVENOUS | Status: DC
  Administered 2021-09-20 – 2021-09-21 (×3): 1 g via INTRAVENOUS
  Filled 2021-09-20 (×4): qty 1

## 2021-09-20 MED ORDER — SODIUM CHLORIDE 0.9 % IV BOLUS
1000.0000 mL | INTRAVENOUS | Status: DC
  Administered 2021-09-20: 1000 mL via INTRAVENOUS

## 2021-09-20 MED ORDER — CHLORHEXIDINE GLUCONATE CLOTH 2 % EX PADS
6.0000 | MEDICATED_PAD | Freq: Every day | CUTANEOUS | Status: DC
  Administered 2021-09-21 – 2021-09-30 (×4): 6 via TOPICAL

## 2021-09-20 MED ORDER — DEXTROSE 50 % IV SOLN: 0.0000 mL | INTRAVENOUS | Status: DC | PRN

## 2021-09-20 MED ORDER — ENOXAPARIN SODIUM 40 MG/0.4ML IJ SOSY: 40.0000 mg | PREFILLED_SYRINGE | INTRAMUSCULAR | Status: DC

## 2021-09-20 MED ORDER — ORAL CARE MOUTH RINSE
15.0000 mL | Freq: Two times a day (BID) | OROMUCOSAL | Status: DC
  Administered 2021-09-20 – 2021-09-30 (×16): 15 mL via OROMUCOSAL

## 2021-09-20 MED ORDER — CHLORHEXIDINE GLUCONATE CLOTH 2 % EX PADS: 6.0000 | MEDICATED_PAD | Freq: Every day | CUTANEOUS | Status: DC

## 2021-09-20 MED ORDER — CHLORHEXIDINE GLUCONATE CLOTH 2 % EX PADS
6.0000 | MEDICATED_PAD | Freq: Every day | CUTANEOUS | Status: DC
  Administered 2021-09-20: 6 via TOPICAL

## 2021-09-20 MED ORDER — LACTATED RINGERS IV SOLN: INTRAVENOUS | Status: DC

## 2021-09-20 MED ORDER — INSULIN ASPART 100 UNIT/ML IJ SOLN: 5.0000 [IU] | Freq: Once | INTRAMUSCULAR | Status: DC

## 2021-09-20 MED ORDER — CALCIUM GLUCONATE 10 % IV SOLN
1.0000 g | Freq: Once | INTRAVENOUS | Status: AC
  Administered 2021-09-20: 1 g via INTRAVENOUS
  Filled 2021-09-20: qty 10

## 2021-09-20 MED ORDER — HEPARIN SODIUM (PORCINE) 5000 UNIT/ML IJ SOLN
5000.0000 [IU] | Freq: Three times a day (TID) | INTRAMUSCULAR | Status: AC
  Administered 2021-09-20 – 2021-09-23 (×10): 5000 [IU] via SUBCUTANEOUS
  Filled 2021-09-20 (×10): qty 1

## 2021-09-20 MED ORDER — LACTATED RINGERS IV BOLUS
1000.0000 mL | INTRAVENOUS | Status: AC
Start: 2021-09-20 — End: 2021-09-20
  Administered 2021-09-20 (×2): 1000 mL via INTRAVENOUS

## 2021-09-20 MED ORDER — ACETAMINOPHEN 325 MG PO TABS
650.0000 mg | ORAL_TABLET | Freq: Four times a day (QID) | ORAL | Status: DC | PRN
  Administered 2021-09-27 – 2021-09-30 (×4): 650 mg via ORAL
  Filled 2021-09-20 (×4): qty 2

## 2021-09-20 MED ORDER — INSULIN REGULAR BOLUS VIA INFUSION
5.0000 [IU] | Freq: Once | INTRAVENOUS | Status: AC
  Administered 2021-09-20: 5 [IU] via INTRAVENOUS
  Filled 2021-09-20: qty 5

## 2021-09-20 MED ORDER — ONDANSETRON HCL 4 MG/2ML IJ SOLN
4.0000 mg | Freq: Four times a day (QID) | INTRAMUSCULAR | Status: DC | PRN
  Administered 2021-09-21 – 2021-09-29 (×2): 4 mg via INTRAVENOUS
  Filled 2021-09-20 (×2): qty 2

## 2021-09-20 MED ORDER — SODIUM CHLORIDE 0.9 % IV BOLUS
1000.0000 mL | Freq: Once | INTRAVENOUS | Status: AC
  Administered 2021-09-20: 1000 mL via INTRAVENOUS

## 2021-09-20 MED ORDER — INSULIN REGULAR(HUMAN) IN NACL 100-0.9 UT/100ML-% IV SOLN
INTRAVENOUS | Status: DC
  Administered 2021-09-20: 10 [IU]/h via INTRAVENOUS
  Administered 2021-09-20: 17 [IU]/h via INTRAVENOUS
  Filled 2021-09-20 (×2): qty 100

## 2021-09-20 MED ORDER — DEXTROSE IN LACTATED RINGERS 5 % IV SOLN: INTRAVENOUS | Status: DC

## 2021-09-20 MED ORDER — LACTATED RINGERS IV SOLN
INTRAVENOUS | Status: DC
Start: 2021-09-20 — End: 2021-09-24

## 2021-09-20 MED ORDER — INSULIN REGULAR(HUMAN) IN NACL 100-0.9 UT/100ML-% IV SOLN: INTRAVENOUS | Status: DC

## 2021-09-20 MED ORDER — ACETAMINOPHEN 650 MG RE SUPP: 650.0000 mg | Freq: Four times a day (QID) | RECTAL | Status: DC | PRN

## 2021-09-20 MED ORDER — ONDANSETRON HCL 4 MG PO TABS
4.0000 mg | ORAL_TABLET | Freq: Four times a day (QID) | ORAL | Status: DC | PRN
  Administered 2021-09-30: 4 mg via ORAL
  Filled 2021-09-20: qty 1

## 2021-09-20 NOTE — ED Notes (Signed)
Patient transported to CT 

## 2021-09-20 NOTE — Progress Notes (Signed)
PHARMACY NOTE -  Aztreonam  Pharmacy has been assisting with dosing of aztreonam for UTI. Dosage remains stable at 1g IV q8 hr and further renal adjustments per institutional Pharmacy antibiotic protocol   Pharmacy will sign off, following peripherally for culture results or dose adjustments. Please reconsult if a change in clinical status warrants re-evaluation of dosage.  Reuel Boom, PharmD, BCPS [phone] 09/20/2021, 7:23 PM

## 2021-09-20 NOTE — H&P (Addendum)
History and Physical    Adam Chung WCB:[id number] DOB: 1951/06/09 DOA: 09/20/2021  PCP: Rusty Aus, MD  Patient coming from: Home.  I have personally briefly reviewed patient's old medical records in [place]  Chief Complaint: Altered mental status and hyperglycemia.  HPI: Adam Chung is a 70 y.o. male with medical history significant of diabetes bladder cancer on BCG injections complicated by developing a bladder infection secondary to the BCG vaccine started on INH, rifampin and ethambutol by ID who has been off his medications since 2 weeks ago who was brought to the emergency department via EMS due to altered mental status.  He call some family members family this morning at 72.  His family woke up and saw missed call.  They attempted to call him back, got no answer so they call EMS.  When the paramedic crew got to the house he was on the ground on his knees complaining of pain in his gluteal area.  Blood glucose on the field was more than 500 mg/dL.  They gave a 500 mL NS bolus.  He is only oriented to person and unable to provide further information.  ED Course: Initial vital signs were temperature 98.7 F, pulse 94, respirations 24, BP 145/68 mmHg O2 sat 100% on room air.  Patient received 3 L of LR bolus and was started on an insulin infusion in the emergency department.  Lab work: Urinalysis showed glucosuria more than 500, ketonuria 5, proteinuria 30 mg/dL.  There was large leukocyte esterase, with RBC more than 50, WBC more than 50 with rare bacteria.  Venous blood gas showed a pH of 7.173, CBC showed a white count of 21.2, hemoglobin 11.5 g/dL platelets 464.  Sodium was 111, potassium 6.5, chloride 78 and CO2 12 mmol/L.  Anion gap was 21.  Glucose 1468, BUN 162 and creatinine 5.50 mg/dL.  Review of Systems: As per HPI otherwise all other systems reviewed and are negative.  Past Medical History:  Diagnosis Date   Cancer ([place])    bladder   Diabetes  mellitus without complication ([place])     Past Surgical History:  Procedure Laterality Date   BLADDER SURGERY     CYSTOSCOPY N/A 08/09/2021   Procedure: CYSTOSCOPY WITH INSERTION OF FOLEY CATHETER;  Surgeon: Janith Lima, MD;  Location: [facility] ORS;  Service: Urology;  Laterality: N/A;   fatty tumor excision     TRANSURETHRAL RESECTION OF PROSTATE N/A 08/09/2021   Procedure: TRANSURETHRAL RESECTION OF THE PROSTATE (TURP);  Surgeon: Janith Lima, MD;  Location: [facility] ORS;  Service: Urology;  Laterality: N/A;   Social History  reports that he has quit smoking. He has never used smokeless tobacco. He reports that he does not currently use alcohol. No history on file for drug use.  Allergies  Allergen Reactions   Apple Anaphylaxis   Penicillins Swelling   The patient is unable to provide his family medical history at this time.  Prior to Admission medications   Medication Sig Start Date End Date Taking? Authorizing Provider  Cholecalciferol 50 MCG (2000 UT) TABS Take 3,000 Units by mouth daily.   Yes [provider]  ciprofloxacin (CIPRO) 500 MG tablet Take 1 tablet (500 mg total) by mouth 2 (two) times daily. 08/20/21  Yes Tsosie Billing, MD  ethambutol (MYAMBUTOL) 400 MG tablet Take 3 tablets (1,200 mg total) by mouth daily. 08/24/21  Yes Tsosie Billing, MD  glimepiride (AMARYL) 4 MG tablet Take 4 mg by mouth daily  with breakfast.   Yes [provider]  isoniazid (NYDRAZID) 300 MG tablet Take 1 tablet (300 mg total) by mouth daily. 08/24/21  Yes Tsosie Billing, MD  ondansetron (ZOFRAN ODT) 8 MG disintegrating tablet Take 1 tablet (8 mg total) by mouth every 8 (eight) hours as needed for nausea or vomiting. 09/10/21  Yes Kuppelweiser, Cassie L, RPH-CPP  pyridOXINE (B-6) 50 MG tablet Take 1 tablet (50 mg total) by mouth daily. 08/24/21  Yes Tsosie Billing, MD  rifampin (RIFADIN) 300 MG capsule Take 2 capsules (600 mg total) by mouth daily. 08/24/21   Yes Tsosie Billing, MD  tamsulosin (FLOMAX) 0.4 MG CAPS capsule Take 0.4 mg by mouth daily.   Yes [provider]  amLODipine (NORVASC) 5 MG tablet Take 1 tablet (5 mg total) by mouth daily. Patient not taking: No sig reported 08/11/21   Fritzi Mandes, MD    Physical Exam: Vitals:   09/20/21 1130 09/20/21 1215 09/20/21 1217 09/20/21 1414  BP: (!) 163/67 (!) 197/97 (!) 175/82 134/71  Pulse: 94 96 95 90  Resp: 15 13 19 16   Temp:      SpO2: 100% 100% 100% 100%  Weight:      Height:        Constitutional: Looks acutely ill.  NAD, calm, comfortable Eyes: PERRL, lids and conjunctivae normal.  Sclera was injected. ENMT: Mucous membranes and lips are very dry.. Posterior pharynx clear of any exudate or lesions. Neck: normal, supple, no masses, no thyromegaly Respiratory: clear to auscultation bilaterally, no wheezing, no crackles. Normal respiratory effort. No accessory muscle use.  Cardiovascular: Regular rate with frequent PVCs., no murmurs / rubs / gallops. No extremity edema. 2+ pedal pulses. No carotid bruits.  Abdomen: No distention.  Bowel sounds positive.  Soft, mild epigastric tenderness, no masses palpated. No hepatosplenomegaly.  Musculoskeletal: Moderate generalized weakness.  No clubbing / cyanosis.  Good ROM, no contractures. Normal muscle tone.  Skin: Decreased skin turgor. Neurologic: CN 2-12 grossly intact. Sensation intact, DTR normal. Strength 5/5 in all 4.  Psychiatric: Alert and oriented x 1. Normal mood.   Labs on Admission: I have personally reviewed following labs and imaging studies  CBC: Recent Labs  Lab 09/20/21 1019 09/20/21 1047  WBC 21.2*  --   NEUTROABS 19.0*  --   HGB 11.5* 11.9*  HCT 34.8* 35.0*  MCV 87.7  --   PLT 464*  --     Basic Metabolic Panel: Recent Labs  Lab 09/20/21 1019 09/20/21 1047 09/20/21 1412 09/20/21 1413  NA 111* 111*  --  120*  K 6.5* 6.7*  --  5.0  CL 78* 86*  --  91*  CO2 12*  --   --  17*  GLUCOSE  1,468* >700*  --  1,101*  BUN 162* >130*  --  156*  CREATININE 5.50* 5.20*  --  4.46*  CALCIUM 8.8*  --   --  8.3*  MG  --   --  3.2*  --   PHOS  --   --  6.5*  --     GFR: Estimated Creatinine Clearance: 16.4 mL/min (A) (by C-G formula based on SCr of 4.46 mg/dL (H)).  Liver Function Tests: No results for input(s): AST, ALT, ALKPHOS, BILITOT, PROT, ALBUMIN in the last 168 hours.  Urine analysis:    Component Value Date/Time   COLORURINE YELLOW 09/20/2021 1019   APPEARANCEUR CLOUDY (A) 09/20/2021 1019   APPEARANCEUR Cloudy (A) 08/18/2021 0908   LABSPEC 1.017 09/20/2021 1019  PHURINE 6.0 09/20/2021 1019   GLUCOSEU >=500 (A) 09/20/2021 1019   HGBUR LARGE (A) 09/20/2021 1019   BILIRUBINUR NEGATIVE 09/20/2021 1019   BILIRUBINUR Negative 08/18/2021 0908   KETONESUR 5 (A) 09/20/2021 1019   PROTEINUR 30 (A) 09/20/2021 1019   NITRITE NEGATIVE 09/20/2021 1019   LEUKOCYTESUR LARGE (A) 09/20/2021 1019    Radiological Exams on Admission: CT ABDOMEN PELVIS WO CONTRAST  Result Date: 09/20/2021 CLINICAL DATA:  Weakness and lethargy. Not eating or drinking x2 days. History low-grade bladder cancer treated with TURBT and BCG. 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 EXAM: CT ABDOMEN AND PELVIS WITHOUT CONTRAST TECHNIQUE: Multidetector CT imaging of the abdomen and pelvis was performed following the standard protocol without IV contrast. COMPARISON:  CT abdomen pelvis 08/09/2021 FINDINGS: Lower chest: No acute abnormality. Hepatobiliary: A 0.6 cm oval circumscribed hypodensity at the dome of the liver is stable dating back to 07/28/2018, likely benign hepatic cyst (series 2, image 11). Status post cholecystectomy. No biliary dilatation. Pancreas: Unremarkable. No pancreatic ductal dilatation or surrounding inflammatory changes. Spleen: Normal in size without focal abnormality.  Adrenals/Urinary Tract: Adrenal glands are unremarkable. Redemonstration of severe bilateral hydroureteronephrosis. No nephrolithiasis or perinephric fat stranding. Previously noted tiny left renal cortical cyst is not appreciated. No new renal mass. Urinary bladder is mildly distended with mild diffuse wall thickening measuring 0.4 cm and mild adjacent fat stranding (series 2, image 70. Stomach/Bowel: Diffuse thickening of the distal esophagus, which can be seen in the setting of reflux. There is a 3 cm area of thickening versus mass in the gastric fundus (series 2, image 14), new compared to CT 07/28/2018. Small bowel feces sign is seen within the distal ileum, consistent with slow transit. No dilatation of the small bowel. Stool is seen within the ascending colon with hard formed stools noted in transverse colon and scattered throughout the descending colon. Scattered diverticula in the sigmoid colon. No inflammatory changes of the bowel. Vascular/Lymphatic: Minimal scattered aortoiliac calcific atherosclerosis. No abdominal aortic aneurysm. No enlarged abdominal or pelvic lymph nodes. The previously noted small perirectal lymph node seen on 08/06/2021 are not appreciated on today's exam. Reproductive: Postoperative changes of resection. Other: Small bilateral fat containing inguinal hernias. Musculoskeletal: Stable sclerotic lesions in the right iliac bone and left acetabulum, unchanged compared to 07/28/2018. multilevel degenerative disc disease in the lumbar spine, most severe at L1-L2 with vacuum disc phenomenon. No suspicious osseous lesion. IMPRESSION: 1. Diffuse mild wall thickening of the urinary bladder with mild adjacent fat stranding, suggestive of recurrent cystitis. 2. Unchanged bilateral severe hydroureteronephrosis. No nephrolithiasis or perinephric fat stranding. 3. In the gastric fundus, there is a 3 cm mass versus area of focal thickening. Recommend further evaluation with EGD for direct  visualization. 4. Hard formed stools in the transverse and descending colon with small bowel feces sign in the distal ileum, indicative of slow transit. No bowel obstruction. Electronically Signed   By: Ileana Roup M.D.   On: 09/20/2021 12:44    EKG: Independently reviewed.  Vent. rate 95 BPM PR interval 180 ms QRS duration 130 ms QT/QTcB 374/471 ms P-R-T axes 34 -46 85 Sinus rhythm Left bundle branch block `prominent/peaked t waves  Assessment/Plan Principal Problem:   DKA (diabetic ketoacidosis) ([place]) Due to   Uncontrolled type 2 diabetes mellitus with hyperglycemia ([place]) Resulting in Altered mental status and dehydration. Admit to stepdown/inpatient. Continue IV fluids. Keep n.p.o. at the moment. Continue  insulin infusion. Monitor CBG frequently. Follow BMP, BHA, magnesium and phosphorus. Neurochecks every 4 hours.  Active Problems:   AKI (acute kidney injury) on Stage III CKD in the presence of severe bilateral chronic hydroureteronephrosis No obstruction seen on imaging.  Continue IV fluids. Monitor intake and output. Avoid hypotension. Follow-up GFR/creatinine and electrolytes in the morning.    SIRS vs Sepsis UTI Lactic acidosis likely from volume depletion. However imaging + for mild cystitis. Has chronic hydronephrosis. Procalcitonin was positive. Aztreonam was added. Follow urine culture and sensitivity.     Trigeminy High PVC burden at the moment. Continue IV hydration. Metoprolol 2.5 mg every 8 hours. Check echocardiogram in AM.    Normocytic anemia Monitor hematocrit hemoglobin. Transfuse if needed.    Hyperkalemia Continue current treatment. Follow-up potassium level.    Thrombocytosis Secondary to hemoconcentration.    Essential hypertension Monitor blood pressure. Started on low-dose beta-blocker.    Hyperphosphatemia Follow level after hydration.    Hypermagnesemia Follow-up level after hydration.    Fundus mass vs focal  thickening Will discuss with GI in the morning.   DVT prophylaxis: Lovenox SQ. Code Status:   Full code. Family Communication:   Disposition Plan:   Patient is from:  Home.  Anticipated DC to:  Home.    Anticipated DC date:  09/22/2021.  Anticipated DC barriers: Clinical status. Consults called:   Admission status:  Inpatient/stepdown.    Severity of Illness:High severity after presenting with altered mental status in the setting of DKA with hyperglycemia in the 1400s.  The patient has being admitted for IV hydration, electrolyte replacement and treatment with insulin infusion.  Reubin Milan MD [facility]  How to contact the [facility] Attending or Consulting provider [place] or covering provider during after hours [place], for this patient?   Check the care team in [facility] and look for a) attending/consulting [facility] provider listed and b) the [facility] team listed Log into [email or web address] and use [place]'s universal password to access. If you do not have the password, please contact the hospital operator. Locate the [facility] provider you are looking for under [facility] and page to a number that you can be directly reached. If you still have difficulty reaching the provider, please page the [facility] (Director on Call) for the Hospitalists listed on amion for assistance.  09/20/2021, 3:55 PM   This document was prepared using Dragon voice recognition software and may contain some unintended transcription errors.

## 2021-09-20 NOTE — ED Triage Notes (Signed)
Patient BIIB [facility] from home. Pt was found bent over in recliner c/o buttock pain. Patient has not been eating or drinking for 2 days and is noncomplaint with medications.[facility] states[facility].  Patient has hx of diabetes. EMS placed 20g in left AC and 500 NS was given by EMS. Patient also complains of increased lower extremity weakness.  Vital signs were:  130/92 96-HR 463-CBG after fluids

## 2021-09-20 NOTE — ED Notes (Signed)
I made the PA aware of critical I Stat Chem 8

## 2021-09-20 NOTE — ED Provider Notes (Signed)
[facility] DEPT Provider Note   CSN: [id number] Arrival date & time: 09/20/21  3825     History No chief complaint on file.   Adam Chung is a 70 y.o. male. With past medical history significant for non-insulin dependent T2DM, bladder cancer, hypertension, CKD III who presents to the emergency department for hyperglycemia and "buttock pain"  Per nursing at bedside, relaying report from EMS, patient called family at 0430 this morning. Family woke up with missed call and attempted to call him back without answer, so they called 911. When EMS arrived to patient's house he was on the ground, on his knees complaining of pain in his bottom. States he also has not eaten in "days." While they were there, they also obtained BG which was >500. They gave patient 500 NS bolus with BG reportedly down to 463.   On my interview, patient continues to endorse pain in his bottom. He states that he has bladder cancer and has been taking "BCG," which has been causing him pain. He states he takes Glimiperide for his diabetes but has not taken his medication in "2 weeks."   On chart review patient was admitted to [facility] on 08/07/21 after having abnormal CT scan at primary care. At that time he had new AKI and bilateral hydronephrosis. On admission he was found to have ureteral stricture on cystoscopy and prostate abscess which was treated with IV antibiotics. Cystoscopy also revealed necrosis in his bladder and subsequent biopsy was obtained. He was eventually discharged with close urology follow-up. 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 He had referral to ID due to AFB+ on staining. ID started him on INH, rifampin, pyroxidine and ethambutol. Foley catheter from admission removed on 08/26/21.   HPI     Past Medical History:  Diagnosis Date   Cancer ([place])    bladder   Diabetes mellitus without complication [facility])      Patient Active Problem List   Diagnosis Date Noted   Acute renal failure ([place]) 08/08/2021   Abscess 08/07/2021   Essential hypertension 08/07/2021   Diabetes mellitus type 2, noninsulin dependent ([place]) 08/07/2021   CKD (chronic kidney disease) stage 3, GFR 30-59 ml/min ([place]) 08/07/2021   AKI (acute kidney injury) ([place]) 08/07/2021   Acute lower UTI 08/07/2021   Acute urinary retention 08/07/2021    Past Surgical History:  Procedure Laterality Date   BLADDER SURGERY     CYSTOSCOPY N/A 08/09/2021   Procedure: CYSTOSCOPY WITH INSERTION OF FOLEY CATHETER;  Surgeon: Janith Lima, MD;  Location: [facility] ORS;  Service: Urology;  Laterality: N/A;   fatty tumor excision     TRANSURETHRAL RESECTION OF PROSTATE N/A 08/09/2021   Procedure: TRANSURETHRAL RESECTION OF THE PROSTATE (TURP);  Surgeon: Janith Lima, MD;  Location: [facility] ORS;  Service: Urology;  Laterality: N/A;       History reviewed. No pertinent family history.  Social History   Tobacco Use   Smoking status: Former   Smokeless tobacco: Never  Substance Use Topics   Alcohol use: Not Currently    Home Medications Prior to Admission medications   Medication Sig Start Date End Date Taking? Authorizing Provider  amLODipine (NORVASC) 5 MG tablet Take 1 tablet (5 mg total) by mouth daily. 08/11/21   Fritzi Mandes, MD  Cholecalciferol 50 MCG (2000 UT) TABS Take 3,000 Units by mouth daily.    [provider]  ciprofloxacin (CIPRO) 500 MG tablet Take 1 tablet (500  mg total) by mouth 2 (two) times daily. 08/20/21   Tsosie Billing, MD  ethambutol (MYAMBUTOL) 400 MG tablet Take 3 tablets (1,200 mg total) by mouth daily. 08/24/21   Tsosie Billing, MD  glimepiride (AMARYL) 4 MG tablet Take 4 mg by mouth daily with breakfast.    [provider]  isoniazid (NYDRAZID) 300 MG tablet Take 1 tablet (300 mg total) by mouth daily. 08/24/21   Tsosie Billing, MD  ondansetron (ZOFRAN ODT) 8 MG disintegrating  tablet Take 1 tablet (8 mg total) by mouth every 8 (eight) hours as needed for nausea or vomiting. 09/10/21   Kuppelweiser, Cassie L, RPH-CPP  pyridOXINE (B-6) 50 MG tablet Take 1 tablet (50 mg total) by mouth daily. 08/24/21   Tsosie Billing, MD  rifampin (RIFADIN) 300 MG capsule Take 2 capsules (600 mg total) by mouth daily. 08/24/21   Tsosie Billing, MD  tamsulosin (FLOMAX) 0.4 MG CAPS capsule Take 0.4 mg by mouth daily.    [provider]    Allergies    Apple and Penicillins  Review of Systems   Review of Systems  Constitutional:  Positive for appetite change and fatigue.  Cardiovascular:  Negative for chest pain.  Gastrointestinal:  Positive for abdominal pain, nausea and vomiting.  Endocrine: Negative for polydipsia and polyphagia.  Genitourinary:  Positive for dysuria.  All other systems reviewed and are negative.  Physical Exam Updated Vital Signs BP (!) 175/82   Pulse 95   Temp 98.7 F (37.1 C)   Resp 19   Ht 5\' 11"  (1.803 m)   Wt 80 kg   SpO2 100%   BMI 24.60 kg/m   Physical Exam Vitals and nursing note reviewed. Exam conducted with a chaperone present.  Constitutional:      Appearance: Normal appearance.  HENT:     Head: Normocephalic.     Mouth/Throat:     Mouth: Mucous membranes are dry.     Pharynx: Oropharynx is clear.  Eyes:     General: No scleral icterus.    Extraocular Movements: Extraocular movements intact.     Pupils: Pupils are equal, round, and reactive to light.  Cardiovascular:     Rate and Rhythm: Regular rhythm. Tachycardia present.     Pulses: Normal pulses.     Heart sounds: No murmur heard. Pulmonary:     Effort: Pulmonary effort is normal. No respiratory distress.     Breath sounds: Normal breath sounds.  Abdominal:     General: Bowel sounds are normal. There is no distension.     Palpations: Abdomen is soft.     Tenderness: There is abdominal tenderness. There is no right CVA tenderness, left CVA  tenderness, guarding or rebound.  Genitourinary:    Penis: Normal. No erythema, discharge or swelling.      Testes: Normal.     Rectum: Normal. No anal fissure or external hemorrhoid.  Musculoskeletal:        General: Normal range of motion.     Cervical back: Normal range of motion and neck supple. No tenderness.  Skin:    General: Skin is warm and dry.     Capillary Refill: Capillary refill takes less than 2 seconds.  Neurological:     General: No focal deficit present.     Mental Status: He is alert and oriented to person, place, and time. Mental status is at baseline.  Psychiatric:        Mood and Affect: Mood normal.  Behavior: Behavior normal.        Thought Content: Thought content normal.        Judgment: Judgment normal.    ED Results / Procedures / Treatments   Labs (all labs ordered are listed, but only abnormal results are displayed) Labs Reviewed  BASIC METABOLIC PANEL - Abnormal; Notable for the following components:      Result Value   Sodium 111 (*)    Potassium 6.5 (*)    Chloride 78 (*)    CO2 12 (*)    Glucose, Bld 1,468 (*)    BUN 162 (*)    Creatinine, Ser 5.50 (*)    Calcium 8.8 (*)    GFR, Estimated 10 (*)    Anion gap 21 (*)    All other components within normal limits  CBC WITH DIFFERENTIAL/PLATELET - Abnormal; Notable for the following components:   WBC 21.2 (*)    RBC 3.97 (*)    Hemoglobin 11.5 (*)    HCT 34.8 (*)    Platelets 464 (*)    Neutro Abs 19.0 (*)    Lymphs Abs 0.6 (*)    Monocytes Absolute 1.3 (*)    Abs Immature Granulocytes 0.23 (*)    All other components within normal limits  URINALYSIS, ROUTINE W REFLEX MICROSCOPIC - Abnormal; Notable for the following components:   APPearance CLOUDY (*)    Glucose, UA >=500 (*)    Hgb urine dipstick LARGE (*)    Ketones, ur 5 (*)    Protein, ur 30 (*)    Leukocytes,Ua LARGE (*)    RBC / HPF >50 (*)    WBC, UA >50 (*)    Bacteria, UA RARE (*)    All other components within  normal limits  BETA-HYDROXYBUTYRIC ACID - Abnormal; Notable for the following components:   Beta-Hydroxybutyric Acid 2.99 (*)    All other components within normal limits  BLOOD GAS, VENOUS - Abnormal; Notable for the following components:   pH, Ven 7.173 (*)    pCO2, Ven 31.5 (*)    pO2, Ven 159.0 (*)    Bicarbonate 11.1 (*)    Acid-base deficit 16.1 (*)    All other components within normal limits  CBG MONITORING, ED - Abnormal; Notable for the following components:   Glucose-Capillary >600 (*)    All other components within normal limits  CBG MONITORING, ED - Abnormal; Notable for the following components:   Glucose-Capillary >600 (*)    All other components within normal limits  CBG MONITORING, ED - Abnormal; Notable for the following components:   Glucose-Capillary >600 (*)    All other components within normal limits  URINE CULTURE  BETA-HYDROXYBUTYRIC ACID  I-STAT CHEM 8, ED    EKG EKG Interpretation  Date/Time:  Sunday September 20 2021 11:36:54 EDT Ventricular Rate:  95 PR Interval:  180 QRS Duration: 130 QT Interval:  374 QTC Calculation: 471 R Axis:   -46 Text Interpretation: Sinus rhythm Left bundle branch block `prominent/peaked t waves Confirmed by Lajean Saver [id number]) on 09/20/2021 12:15:14 PM  Radiology CT ABDOMEN PELVIS WO CONTRAST  Result Date: 09/20/2021 CLINICAL DATA:  Weakness and lethargy. Not eating or drinking x2 days. History low-grade bladder cancer treated with TURBT and BCG. 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 EXAM: CT ABDOMEN AND PELVIS WITHOUT CONTRAST TECHNIQUE: Multidetector CT imaging of the abdomen and pelvis was performed following the standard protocol without  IV contrast. COMPARISON:  CT abdomen pelvis 08/09/2021 FINDINGS: Lower chest: No acute abnormality. Hepatobiliary: A 0.6 cm oval circumscribed hypodensity at the dome  of the liver is stable dating back to 07/28/2018, likely benign hepatic cyst (series 2, image 11). Status post cholecystectomy. No biliary dilatation. Pancreas: Unremarkable. No pancreatic ductal dilatation or surrounding inflammatory changes. Spleen: Normal in size without focal abnormality. Adrenals/Urinary Tract: Adrenal glands are unremarkable. Redemonstration of severe bilateral hydroureteronephrosis. No nephrolithiasis or perinephric fat stranding. Previously noted tiny left renal cortical cyst is not appreciated. No new renal mass. Urinary bladder is mildly distended with mild diffuse wall thickening measuring 0.4 cm and mild adjacent fat stranding (series 2, image 70. Stomach/Bowel: Diffuse thickening of the distal esophagus, which can be seen in the setting of reflux. There is a 3 cm area of thickening versus mass in the gastric fundus (series 2, image 14), new compared to CT 07/28/2018. Small bowel feces sign is seen within the distal ileum, consistent with slow transit. No dilatation of the small bowel. Stool is seen within the ascending colon with hard formed stools noted in transverse colon and scattered throughout the descending colon. Scattered diverticula in the sigmoid colon. No inflammatory changes of the bowel. Vascular/Lymphatic: Minimal scattered aortoiliac calcific atherosclerosis. No abdominal aortic aneurysm. No enlarged abdominal or pelvic lymph nodes. The previously noted small perirectal lymph node seen on 08/06/2021 are not appreciated on today's exam. Reproductive: Postoperative changes of resection. Other: Small bilateral fat containing inguinal hernias. Musculoskeletal: Stable sclerotic lesions in the right iliac bone and left acetabulum, unchanged compared to 07/28/2018. multilevel degenerative disc disease in the lumbar spine, most severe at L1-L2 with vacuum disc phenomenon. No suspicious osseous lesion. IMPRESSION: 1. Diffuse mild wall thickening of the urinary bladder with mild  adjacent fat stranding, suggestive of recurrent cystitis. 2. Unchanged bilateral severe hydroureteronephrosis. No nephrolithiasis or perinephric fat stranding. 3. In the gastric fundus, there is a 3 cm mass versus area of focal thickening. Recommend further evaluation with EGD for direct visualization. 4. Hard formed stools in the transverse and descending colon with small bowel feces sign in the distal ileum, indicative of slow transit. No bowel obstruction. Electronically Signed   By: Ileana Roup M.D.   On: 09/20/2021 12:44    Procedures .Critical Care Performed by: Mickie Hillier, PA-C Authorized by: Mickie Hillier, PA-C   Critical care provider statement:    Critical care time (minutes):  40   Critical care time was exclusive of:  Separately billable procedures and treating other patients   Critical care was necessary to treat or prevent imminent or life-threatening deterioration of the following conditions:  Dehydration, endocrine crisis, metabolic crisis and renal failure   Critical care was time spent personally by me on the following activities:  Development of treatment plan with patient or surrogate, discussions with consultants, evaluation of patient's response to treatment, examination of patient, obtaining history from patient or surrogate, review of old charts, re-evaluation of patient's condition, pulse oximetry, ordering and review of radiographic studies, ordering and review of laboratory studies and ordering and performing treatments and interventions   I assumed direction of critical care for this patient from another provider in my specialty: no     Care discussed with: admitting provider     Medications Ordered in ED Medications  insulin regular, human (MYXREDLIN) 100 units/ 100 mL infusion (10 Units/hr Intravenous Rate/Dose Change 09/20/21 1313)  lactated ringers infusion ( Intravenous Bolus from Bag 09/20/21 1216)  dextrose 5 %  in lactated ringers infusion (has no  administration in time range)  dextrose 50 % solution 0-50 mL (has no administration in time range)  calcium gluconate inj 10% (1 g) URGENT USE ONLY! (1 g Intravenous Given 09/20/21 1119)  sodium chloride 0.9 % bolus 1,000 mL (1,000 mLs Intravenous Bolus from Bag 09/20/21 1216)    ED Course  I have reviewed the triage vital signs and the nursing notes.  Pertinent labs & imaging results that were available during my care of the patient were reviewed by me and considered in my medical decision making (see chart for details).  Spoke with Dr. Tennis Must, Hospitalist @1253  who agrees to admit patient to step down at this time.  MDM Rules/Calculators/A&P 53yoM who presents to the emergency department after being found on the floor at home.   Patient presents with hyperglycemia, nausea, not taking meds for 2 weeks and bottom pain that is related to his known sequela of bladder cancer and subsequent BCG treatment.  Found to be in DKA.  Blood glucose 1468. Given 2 L IV fluid bolus Anion gap 21, acidotic to 7.1, BHB 2.99 Sodium 111, after being corrected for hyperglycemia is 133. Started on DKA protocol with insulin drip  Potassium 6.5 with peaked T waves, given 1 g of calcium gluconate to temporize.  WBC 21 likely multifactorial and reactive to DKA and known recurrent cystitis.  Currently taking Cipro outpatient.  Question whether he has been compliant on this given that he has not taken his glimepiride. Obtain CT abdomen pelvis given that he has now chronic severe hydronephrosis and recent cystoscopy with positive AFB. (He did recently have chest x-ray which was negative for findings consistent with TB.) CT shows diffuse mild wall thickening of the urinary bladder with fat stranding with recurrent cystitis.  He has unchanged bilateral severe hydroureteronephrosis.  Dr. Olevia Bowens, hospitalist called who agrees to admit the patient to stepdown at this time.  Final Clinical Impression(s) / ED  Diagnoses Final diagnoses:  Diabetic ketoacidosis without coma associated with type 2 diabetes mellitus [facility])    Rx / DC Orders ED Discharge Orders     None        Mickie Hillier, PA-C 09/20/21 1344    Lajean Saver, MD 09/22/21 [phone]

## 2021-09-21 ENCOUNTER — Inpatient Hospital Stay (HOSPITAL_COMMUNITY): Payer: Medicare Other

## 2021-09-21 DIAGNOSIS — E1110 Type 2 diabetes mellitus with ketoacidosis without coma: Secondary | ICD-10-CM | POA: Diagnosis not present

## 2021-09-21 DIAGNOSIS — E1165 Type 2 diabetes mellitus with hyperglycemia: Secondary | ICD-10-CM

## 2021-09-21 DIAGNOSIS — N179 Acute kidney failure, unspecified: Secondary | ICD-10-CM | POA: Diagnosis not present

## 2021-09-21 DIAGNOSIS — R008 Other abnormalities of heart beat: Secondary | ICD-10-CM

## 2021-09-21 DIAGNOSIS — L8990 Pressure ulcer of unspecified site, unspecified stage: Secondary | ICD-10-CM

## 2021-09-21 DIAGNOSIS — I10 Essential (primary) hypertension: Secondary | ICD-10-CM

## 2021-09-21 DIAGNOSIS — E875 Hyperkalemia: Secondary | ICD-10-CM

## 2021-09-21 DIAGNOSIS — D75839 Thrombocytosis, unspecified: Secondary | ICD-10-CM

## 2021-09-21 DIAGNOSIS — D649 Anemia, unspecified: Secondary | ICD-10-CM

## 2021-09-21 LAB — GLUCOSE, CAPILLARY
Glucose-Capillary: 104 mg/dL — ABNORMAL HIGH (ref 70–99)
Glucose-Capillary: 122 mg/dL — ABNORMAL HIGH (ref 70–99)
Glucose-Capillary: 129 mg/dL — ABNORMAL HIGH (ref 70–99)
Glucose-Capillary: 146 mg/dL — ABNORMAL HIGH (ref 70–99)
Glucose-Capillary: 146 mg/dL — ABNORMAL HIGH (ref 70–99)
Glucose-Capillary: 162 mg/dL — ABNORMAL HIGH (ref 70–99)
Glucose-Capillary: 164 mg/dL — ABNORMAL HIGH (ref 70–99)
Glucose-Capillary: 168 mg/dL — ABNORMAL HIGH (ref 70–99)
Glucose-Capillary: 169 mg/dL — ABNORMAL HIGH (ref 70–99)
Glucose-Capillary: 175 mg/dL — ABNORMAL HIGH (ref 70–99)
Glucose-Capillary: 182 mg/dL — ABNORMAL HIGH (ref 70–99)
Glucose-Capillary: 194 mg/dL — ABNORMAL HIGH (ref 70–99)
Glucose-Capillary: 198 mg/dL — ABNORMAL HIGH (ref 70–99)

## 2021-09-21 LAB — URINE CULTURE: Culture: <10000 — AB

## 2021-09-21 LAB — BASIC METABOLIC PANEL
Anion gap: 10 (ref 5–15)
Anion gap: 12 (ref 5–15)
BUN: 143 mg/dL — ABNORMAL HIGH (ref 8–23)
BUN: 144 mg/dL — ABNORMAL HIGH (ref 8–23)
CO2: 19 mmol/L — ABNORMAL LOW (ref 22–32)
CO2: 16 mmol/L — ABNORMAL LOW (ref 22–32)
Calcium: 9.1 mg/dL (ref 8.9–10.3)
Calcium: 8.8 mg/dL — ABNORMAL LOW (ref 8.9–10.3)
Chloride: 104 mmol/L (ref 98–111)
Chloride: 106 mmol/L (ref 98–111)
Creatinine, Ser: 3.44 mg/dL — ABNORMAL HIGH (ref 0.61–1.24)
Creatinine, Ser: 3.64 mg/dL — ABNORMAL HIGH (ref 0.61–1.24)
GFR, Estimated: 18 mL/min — ABNORMAL LOW (ref >60)
GFR, Estimated: 17 mL/min — ABNORMAL LOW (ref >60)
Glucose, Bld: 125 mg/dL — ABNORMAL HIGH (ref 70–99)
Glucose, Bld: 148 mg/dL — ABNORMAL HIGH (ref 70–99)
Potassium: 4.9 mmol/L (ref 3.5–5.1)
Potassium: 5 mmol/L (ref 3.5–5.1)
Sodium: 133 mmol/L — ABNORMAL LOW (ref 135–145)
Sodium: 134 mmol/L — ABNORMAL LOW (ref 135–145)

## 2021-09-21 LAB — ECHOCARDIOGRAM COMPLETE
Area-P 1/2: 3.63 cm2
Height: 71 in
Weight: 2518.54 oz

## 2021-09-21 LAB — BETA-HYDROXYBUTYRIC ACID: Beta-Hydroxybutyric Acid: 0.27 mmol/L (ref 0.05–0.27)

## 2021-09-21 MED ORDER — ETHAMBUTOL HCL 400 MG PO TABS
1200.0000 mg | ORAL_TABLET | Freq: Every day | ORAL | Status: DC
  Administered 2021-09-21 – 2021-09-30 (×9): 1200 mg via ORAL
  Filled 2021-09-21 (×9): qty 3
  Filled 2021-09-21: qty 1
  Filled 2021-09-21 (×2): qty 3

## 2021-09-21 MED ORDER — TAMSULOSIN HCL 0.4 MG PO CAPS
0.4000 mg | ORAL_CAPSULE | Freq: Every day | ORAL | Status: DC
  Administered 2021-09-22 – 2021-09-30 (×8): 0.4 mg via ORAL
  Filled 2021-09-21 (×9): qty 1

## 2021-09-21 MED ORDER — PERFLUTREN LIPID MICROSPHERE
1.0000 mL | INTRAVENOUS | Status: AC | PRN
  Administered 2021-09-21: 2 mL via INTRAVENOUS
  Filled 2021-09-21: qty 10

## 2021-09-21 MED ORDER — INSULIN ASPART 100 UNIT/ML IJ SOLN: 0.0000 [IU] | Freq: Every day | INTRAMUSCULAR | Status: DC

## 2021-09-21 MED ORDER — PHENAZOPYRIDINE HCL 200 MG PO TABS: 200.0000 mg | ORAL_TABLET | Freq: Three times a day (TID) | ORAL | Status: DC

## 2021-09-21 MED ORDER — VITAMIN B-6 50 MG PO TABS
50.0000 mg | ORAL_TABLET | Freq: Every day | ORAL | Status: DC
  Administered 2021-09-22 – 2021-09-30 (×8): 50 mg via ORAL
  Filled 2021-09-21 (×10): qty 1

## 2021-09-21 MED ORDER — RIFAMPIN 300 MG PO CAPS
600.0000 mg | ORAL_CAPSULE | Freq: Every day | ORAL | Status: DC
  Administered 2021-09-21 – 2021-09-30 (×9): 600 mg via ORAL
  Filled 2021-09-21 (×11): qty 2

## 2021-09-21 MED ORDER — ISONIAZID 300 MG PO TABS
300.0000 mg | ORAL_TABLET | Freq: Every day | ORAL | Status: DC
  Administered 2021-09-21 – 2021-09-30 (×9): 300 mg via ORAL
  Filled 2021-09-21 (×11): qty 1

## 2021-09-21 MED ORDER — INSULIN GLARGINE-YFGN 100 UNIT/ML ~~LOC~~ SOLN
30.0000 [IU] | Freq: Every day | SUBCUTANEOUS | Status: DC
  Administered 2021-09-21 – 2021-09-25 (×5): 30 [IU] via SUBCUTANEOUS
  Filled 2021-09-21 (×6): qty 0.3

## 2021-09-21 MED ORDER — INSULIN ASPART 100 UNIT/ML IJ SOLN
0.0000 [IU] | Freq: Three times a day (TID) | INTRAMUSCULAR | Status: DC
  Administered 2021-09-21 (×2): 3 [IU] via SUBCUTANEOUS
  Administered 2021-09-22 (×3): 5 [IU] via SUBCUTANEOUS
  Administered 2021-09-23: 3 [IU] via SUBCUTANEOUS
  Administered 2021-09-23: 5 [IU] via SUBCUTANEOUS
  Administered 2021-09-23: 3 [IU] via SUBCUTANEOUS
  Administered 2021-09-24: 2 [IU] via SUBCUTANEOUS
  Administered 2021-09-25 – 2021-09-26 (×3): 3 [IU] via SUBCUTANEOUS
  Administered 2021-09-27: 5 [IU] via SUBCUTANEOUS
  Administered 2021-09-27: 2 [IU] via SUBCUTANEOUS
  Administered 2021-09-28: 8 [IU] via SUBCUTANEOUS
  Administered 2021-09-28: 3 [IU] via SUBCUTANEOUS
  Administered 2021-09-28: 5 [IU] via SUBCUTANEOUS
  Administered 2021-09-29: 3 [IU] via SUBCUTANEOUS
  Administered 2021-09-29: 5 [IU] via SUBCUTANEOUS

## 2021-09-21 NOTE — Progress Notes (Signed)
Inpatient Diabetes Program Recommendations  AACE/ADA: New Consensus Statement on Inpatient Glycemic Control (2015)  Target Ranges:  Prepandial:   less than 140 mg/dL      Peak postprandial:   less than 180 mg/dL (1-2 hours)      Critically ill patients:  140 - 180 mg/dL   Lab Results  Component Value Date   GLUCAP 175 (H) 09/21/2021   HGBA1C 8.3 (H) 08/07/2021    Review of Glycemic Control  Diabetes history: DM 2 Outpatient Diabetes medications: Amaryl 4 mg Daily Current orders for Inpatient glycemic control:  IV insulin transitioning to  Semglee 30 units Daily Novolog 0-15 units + hs  Inpatient Diabetes Program Recommendations:    Spoke with pt daughter and son in law at bedside. They report pt usually control his glucose levels through his diet. Last A1c was 8.3 on 9/9. With in the last month pt has been battling an infection. Pt reports there would be times he would miss his Amaryl up to one week without taking it. Family reports Pt been drinking 6 Ensures, 3-4 regular popscicles a day instead of eating, an occasional milk shake, and fruit. Pt has not been able to eat solid foods due to a sore throat????  Discussed Glucerna and low carb options for pt outpt for supplement use. Family wants to know what is causing the sore throat. They do not feel their doctors are addressing it.  Pt reports being on an insulin pen in the past. Discussed the need for pt to possibly on insulin at time of d/c. Showed family the insulin pen. Pt lives by himself.  Will follow pt while here.  Thanks,  Tama Headings RN, MSN, BC-ADM Inpatient Diabetes Coordinator Team Pager [phone] (8a-5p)

## 2021-09-21 NOTE — Progress Notes (Signed)
PROGRESS NOTE    Adam Chung  VOZ:[id number] DOB: 09/25/1951 DOA: 09/20/2021 PCP: Rusty Aus, MD   Brief Narrative:  Adam Chung is a 70 y.o. male with medical history significant of CKD3a, diabetes bladder cancer on BCG injections complicated by developing a bladder infection secondary to the BCG vaccine started on RIPE by ID in [place], family indicates he has been off his medications for 2 weeks prior to admission due to noncompliance. It appears patient was having increased confusion and was blaming his new medications for the symptoms. Patient not answering his phone so EMS was contacted - when the paramedic crew got to the house he was on the ground on his knees complaining of pain in his gluteal area. Blood glucose on the field was more than 500 mg/dL. Found to have DKA in ED with glucose >600 and altered mental status concerning for worsening UTI in the setting of BCG.  Assessment & Plan:   DKA (diabetic ketoacidosis) ([place]) Uncontrolled type 2 diabetes mellitus with hyperglycemia (H[facility]) Acute metabolic encephalopathy, resolving  -Anion gap closed, transition to long-acting and sliding scale insulin, hypoglycemic protocol -A1c 8.3 consistent with uncontrolled diabetes mellitus.  Not insulin-dependent at home, previously diet-controlled diabetes per family -Hyperglycemia likely acutely worsened in the setting of UTI as below due to BCG and noncompliance with home antibiotics -Continue diabetic diet, advance as tolerated  Sepsis UTI, POA - Ongoing BCG infection - ID confirmed to continue current RIPE therapy - Follow cultures - DC aztreonam pending cultures  AKI (acute kidney injury) on Stage IIIa CKD in the presence of severe bilateral chronic hydroureteronephrosis No obstruction seen on imaging. Continue IV fluids until p.o. intake improves appropriately. Monitor intake and output. Avoid hypotension.  Trigeminy Continue metoprolol 2.5 mg every 8 hours. Rate  currently well controlled Echo pending   Normocytic anemia Monitor hematocrit hemoglobin. Transfuse as needed.   Hyperkalemia, resolved Follow repeat labs   Thrombocytosis Secondary to hemoconcentration.   Essential hypertension Monitor blood pressure. Started on low-dose beta-blocker.   Hyperphosphatemia Follow level after hydration.   Hypermagnesemia Follow-up level after hydration.   DVT prophylaxis:      Lovenox SQ. Code Status:              Full code. Family Communication: At bedside  Status is: Inpatient  Dispo: The patient is from: Home              Anticipated d/c is to: To be determined              Anticipated d/c date is: 72+ hours              Patient currently not medically stable for discharge  Consultants:  None  Procedures:  None  Antimicrobials:  RIPE, poa   Subjective: No acute issues or events overnight, patient somnolent, difficult to arouse, review of systems somewhat limited  Objective: Vitals:   09/21/21 0100 09/21/21 0200 09/21/21 0300 09/21/21 0400  BP: (!) 134/51 134/66 (!) 156/73   Pulse: 85 89 96   Resp: 13 11 13    Temp:    98 F (36.7 C)  TempSrc:    Axillary  SpO2: 100% 100% 100%   Weight:      Height:        Intake/Output Summary (Last 24 hours) at 09/21/2021 0705 Last data filed at 09/21/2021 4259 Gross per 24 hour  Intake 1620.64 ml  Output 1616 ml  Net 4.64 ml   [employer]  09/20/21 1007 09/20/21 1700  Weight: 80 kg 71.4 kg    Examination:  General:  Pleasantly resting in bed, No acute distress. HEENT:  Normocephalic atraumatic.  Sclerae nonicteric, noninjected.  Extraocular movements intact bilaterally. Neck:  Without mass or deformity.  Trachea is midline. Lungs:  Clear to auscultate bilaterally without rhonchi, wheeze, or rales. Heart:  Regular rate and rhythm.  Without murmurs, rubs, or gallops. Abdomen:  Soft, nontender, nondistended.  Without guarding or rebound. Extremities: Without cyanosis,  clubbing, edema, or obvious deformity. Vascular:  Dorsalis pedis and posterior tibial pulses palpable bilaterally. Skin:  Warm and dry, no erythema, no ulcerations.  Data Reviewed: I have personally reviewed following labs and imaging studies  CBC: Recent Labs  Lab 09/20/21 1019 09/20/21 1047  WBC 21.2*  --   NEUTROABS 19.0*  --   HGB 11.5* 11.9*  HCT 34.8* 35.0*  MCV 87.7  --   PLT 464*  --    Basic Metabolic Panel: Recent Labs  Lab 09/20/21 1019 09/20/21 1047 09/20/21 1412 09/20/21 1413 09/20/21 2116 09/21/21 0122 09/21/21 0429  NA 111* 111*  --  120* 134* 133* 134*  K 6.5* 6.7*  --  5.0 4.8 4.9 5.0  CL 78* 86*  --  91* 102 104 106  CO2 12*  --   --  17* 17* 19* 16*  GLUCOSE 1,468* >700*  --  1,101* 245* 125* 148*  BUN 162* >130*  --  156* 146* 143* 144*  CREATININE 5.50* 5.20*  --  4.46* 3.98* 3.44* 3.64*  CALCIUM 8.8*  --   --  8.3* 9.4 9.1 8.8*  MG  --   --  3.2*  --   --   --   --   PHOS  --   --  6.5*  --   --   --   --    GFR: Estimated Creatinine Clearance: 19.1 mL/min (A) (by C-G formula based on SCr of 3.64 mg/dL (H)). Liver Function Tests: No results for input(s): AST, ALT, ALKPHOS, BILITOT, PROT, ALBUMIN in the last 168 hours. No results for input(s): LIPASE, AMYLASE in the last 168 hours. No results for input(s): AMMONIA in the last 168 hours. Coagulation Profile: No results for input(s): INR, PROTIME in the last 168 hours. Cardiac Enzymes: No results for input(s): CKTOTAL, CKMB, CKMBINDEX, TROPONINI in the last 168 hours. BNP (last 3 results) No results for input(s): PROBNP in the last 8760 hours. HbA1C: No results for input(s): HGBA1C in the last 72 hours. CBG: Recent Labs  Lab 09/21/21 0043 09/21/21 0153 09/21/21 0257 09/21/21 0401 09/21/21 0505  GLUCAP 164* 168* 122* 129* 169*   Lipid Profile: No results for input(s): CHOL, HDL, LDLCALC, TRIG, CHOLHDL, LDLDIRECT in the last 72 hours. Thyroid Function Tests: No results for input(s):  TSH, T4TOTAL, FREET4, T3FREE, THYROIDAB in the last 72 hours. Anemia Panel: No results for input(s): VITAMINB12, FOLATE, FERRITIN, TIBC, IRON, RETICCTPCT in the last 72 hours. Sepsis Labs: Recent Labs  Lab 09/20/21 1813  PROCALCITON 1.20    Recent Results (from the past 240 hour(s))  MRSA Next Gen by PCR, Nasal     Status: None   Collection Time: 09/20/21  3:40 PM   Specimen: Nasal Mucosa; Nasal Swab  Result Value Ref Range Status   MRSA by PCR Next Gen NOT DETECTED NOT DETECTED Final    Comment: (NOTE) The GeneXpert MRSA Assay (FDA approved for NASAL specimens only), is one component of a comprehensive MRSA colonization surveillance program. It is  not intended to diagnose MRSA infection nor to guide or monitor treatment for MRSA infections. Test performance is not FDA approved in patients less than 73 years old. Performed at [facility], [place] [address], [place], [place] [address]   Resp Panel by RT-PCR (Flu A&B, Covid) Nasal Mucosa     Status: None   Collection Time: 09/20/21  3:41 PM   Specimen: Nasal Mucosa; Nasopharyngeal(NP) swabs in vial transport medium  Result Value Ref Range Status   SARS Coronavirus 2 by RT PCR NEGATIVE NEGATIVE Final    Comment: (NOTE) SARS-CoV-2 target nucleic acids are NOT DETECTED.  The SARS-CoV-2 RNA is generally detectable in upper respiratory specimens during the acute phase of infection. The lowest concentration of SARS-CoV-2 viral copies this assay can detect is 138 copies/mL. A negative result does not preclude SARS-Cov-2 infection and should not be used as the sole basis for treatment or other patient management decisions. A negative result may occur with  improper specimen collection/handling, submission of specimen other than nasopharyngeal swab, presence of viral mutation(s) within the areas targeted by this assay, and inadequate number of viral copies(<138 copies/mL). A negative result must be combined  with clinical observations, patient history, and epidemiological information. The expected result is Negative.  Fact Sheet for Patients:  EntrepreneurPulse.com.au  Fact Sheet for Healthcare Providers:  IncredibleEmployment.be  This test is no t yet approved or cleared by the [place] FDA and  has been authorized for detection and/or diagnosis of SARS-CoV-2 by FDA under an Emergency Use Authorization (EUA). This EUA will remain  in effect (meaning this test can be used) for the duration of the COVID-19 declaration under Section 564(b)(1) of the Act, 21 U.S.C.section 360bbb-3(b)(1), unless the authorization is terminated  or revoked sooner.       Influenza A by PCR NEGATIVE NEGATIVE Final   Influenza B by PCR NEGATIVE NEGATIVE Final    Comment: (NOTE) The Xpert Xpress SARS-CoV-2/FLU/RSV plus assay is intended as an aid in the diagnosis of influenza from Nasopharyngeal swab specimens and should not be used as a sole basis for treatment. Nasal washings and aspirates are unacceptable for Xpert Xpress SARS-CoV-2/FLU/RSV testing.  Fact Sheet for Patients: EntrepreneurPulse.com.au  Fact Sheet for Healthcare Providers: IncredibleEmployment.be  This test is not yet approved or cleared by the [place] FDA and has been authorized for detection and/or diagnosis of SARS-CoV-2 by FDA under an Emergency Use Authorization (EUA). This EUA will remain in effect (meaning this test can be used) for the duration of the COVID-19 declaration under Section 564(b)(1) of the Act, 21 U.S.C. section 360bbb-3(b)(1), unless the authorization is terminated or revoked.  Performed at [facility], [place] [address], [place], [place] [address]      Radiology Studies: CT ABDOMEN PELVIS WO CONTRAST  Result Date: 09/20/2021 CLINICAL DATA:  Weakness and lethargy. Not eating or drinking x2 days. History  low-grade bladder cancer treated with TURBT and BCG. 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 EXAM: CT ABDOMEN AND PELVIS WITHOUT CONTRAST TECHNIQUE: Multidetector CT imaging of the abdomen and pelvis was performed following the standard protocol without IV contrast. COMPARISON:  CT abdomen pelvis 08/09/2021 FINDINGS: Lower chest: No acute abnormality. Hepatobiliary: A 0.6 cm oval circumscribed hypodensity at the dome of the liver is stable dating back to 07/28/2018, likely benign hepatic cyst (series 2, image 11). Status post cholecystectomy. No biliary dilatation. Pancreas: Unremarkable. No pancreatic ductal  dilatation or surrounding inflammatory changes. Spleen: Normal in size without focal abnormality. Adrenals/Urinary Tract: Adrenal glands are unremarkable. Redemonstration of severe bilateral hydroureteronephrosis. No nephrolithiasis or perinephric fat stranding. Previously noted tiny left renal cortical cyst is not appreciated. No new renal mass. Urinary bladder is mildly distended with mild diffuse wall thickening measuring 0.4 cm and mild adjacent fat stranding (series 2, image 70. Stomach/Bowel: Diffuse thickening of the distal esophagus, which can be seen in the setting of reflux. There is a 3 cm area of thickening versus mass in the gastric fundus (series 2, image 14), new compared to CT 07/28/2018. Small bowel feces sign is seen within the distal ileum, consistent with slow transit. No dilatation of the small bowel. Stool is seen within the ascending colon with hard formed stools noted in transverse colon and scattered throughout the descending colon. Scattered diverticula in the sigmoid colon. No inflammatory changes of the bowel. Vascular/Lymphatic: Minimal scattered aortoiliac calcific atherosclerosis. No abdominal aortic aneurysm. No enlarged abdominal or pelvic lymph nodes. The  previously noted small perirectal lymph node seen on 08/06/2021 are not appreciated on today's exam. Reproductive: Postoperative changes of resection. Other: Small bilateral fat containing inguinal hernias. Musculoskeletal: Stable sclerotic lesions in the right iliac bone and left acetabulum, unchanged compared to 07/28/2018. multilevel degenerative disc disease in the lumbar spine, most severe at L1-L2 with vacuum disc phenomenon. No suspicious osseous lesion. IMPRESSION: 1. Diffuse mild wall thickening of the urinary bladder with mild adjacent fat stranding, suggestive of recurrent cystitis. 2. Unchanged bilateral severe hydroureteronephrosis. No nephrolithiasis or perinephric fat stranding. 3. In the gastric fundus, there is a 3 cm mass versus area of focal thickening. Recommend further evaluation with EGD for direct visualization. 4. Hard formed stools in the transverse and descending colon with small bowel feces sign in the distal ileum, indicative of slow transit. No bowel obstruction. Electronically Signed   By: Ileana Roup M.D.   On: 09/20/2021 12:44    Scheduled Meds:  Chlorhexidine Gluconate Cloth  6 each Topical Q0600   ethambutol  1,200 mg Oral Daily   heparin injection (subcutaneous)  5,000 Units Subcutaneous Q8H   isoniazid  300 mg Oral Daily   mouth rinse  15 mL Mouth Rinse BID   metoprolol tartrate  5 mg Intravenous Q8H   pyridOXINE  50 mg Oral Daily   rifampin  600 mg Oral Daily   tamsulosin  0.4 mg Oral Daily   Continuous Infusions:  aztreonam Stopped (09/21/21 0628)   dextrose 5% lactated ringers 125 mL/hr at 09/21/21 0518   insulin 3.6 Units/hr (09/21/21 0628)   lactated ringers 125 mL/hr at 09/20/21 1730     LOS: 1 day   Time spent: 55min  Azyah Flett C Tremane Spurgeon, DO [facility]  If 7PM-7AM, please contact night-coverage [email or web address]  09/21/2021, 7:05 AM

## 2021-09-22 DIAGNOSIS — E1110 Type 2 diabetes mellitus with ketoacidosis without coma: Secondary | ICD-10-CM | POA: Diagnosis not present

## 2021-09-22 DIAGNOSIS — N179 Acute kidney failure, unspecified: Secondary | ICD-10-CM | POA: Diagnosis not present

## 2021-09-22 DIAGNOSIS — E875 Hyperkalemia: Secondary | ICD-10-CM | POA: Diagnosis not present

## 2021-09-22 DIAGNOSIS — I10 Essential (primary) hypertension: Secondary | ICD-10-CM | POA: Diagnosis not present

## 2021-09-22 LAB — GLUCOSE, CAPILLARY
Glucose-Capillary: 233 mg/dL — ABNORMAL HIGH (ref 70–99)
Glucose-Capillary: 242 mg/dL — ABNORMAL HIGH (ref 70–99)
Glucose-Capillary: 201 mg/dL — ABNORMAL HIGH (ref 70–99)
Glucose-Capillary: 146 mg/dL — ABNORMAL HIGH (ref 70–99)

## 2021-09-22 LAB — BASIC METABOLIC PANEL
Anion gap: 9 (ref 5–15)
BUN: 122 mg/dL — ABNORMAL HIGH (ref 8–23)
CO2: 18 mmol/L — ABNORMAL LOW (ref 22–32)
Calcium: 8.9 mg/dL (ref 8.9–10.3)
Chloride: 107 mmol/L (ref 98–111)
Creatinine, Ser: 3.12 mg/dL — ABNORMAL HIGH (ref 0.61–1.24)
GFR, Estimated: 21 mL/min — ABNORMAL LOW (ref >60)
Glucose, Bld: 225 mg/dL — ABNORMAL HIGH (ref 70–99)
Potassium: 5 mmol/L (ref 3.5–5.1)
Sodium: 134 mmol/L — ABNORMAL LOW (ref 135–145)

## 2021-09-22 LAB — CBC
HCT: 30.3 % — ABNORMAL LOW (ref 39.0–52.0)
Hemoglobin: 10.2 g/dL — ABNORMAL LOW (ref 13.0–17.0)
MCH: 29 pg (ref 26.0–34.0)
MCHC: 33.7 g/dL (ref 30.0–36.0)
MCV: 86.1 fL (ref 80.0–100.0)
Platelets: 234 10*3/uL (ref 150–400)
RBC: 3.52 MIL/uL — ABNORMAL LOW (ref 4.22–5.81)
RDW: 15.2 % (ref 11.5–15.5)
WBC: 13.8 10*3/uL — ABNORMAL HIGH (ref 4.0–10.5)
nRBC: 0 % (ref 0.0–0.2)

## 2021-09-22 MED ORDER — SIMETHICONE 80 MG PO CHEW: 80.0000 mg | CHEWABLE_TABLET | Freq: Four times a day (QID) | ORAL | Status: DC | PRN

## 2021-09-22 MED ORDER — METOPROLOL TARTRATE 5 MG/5ML IV SOLN: 5.0000 mg | Freq: Three times a day (TID) | INTRAVENOUS | Status: DC | PRN

## 2021-09-22 NOTE — Progress Notes (Signed)
PROGRESS NOTE    HARDY HARCUM  FYB:[id number] DOB: 1951-03-16 DOA: 09/20/2021 PCP: Rusty Aus, MD   Brief Narrative:  Adam Chung is a 70 y.o. male with medical history significant of CKD3a, diabetes bladder cancer on BCG injections complicated by developing a bladder infection secondary to the BCG vaccine started on RIPE by ID in [place], family indicates he has been off his medications for 2 weeks prior to admission due to noncompliance. It appears patient was having increased confusion and was blaming his new medications for the symptoms. Patient not answering his phone so EMS was contacted - when the paramedic crew got to the house he was on the ground on his knees complaining of pain in his gluteal area. Blood glucose on the field was more than 500 mg/dL. Found to have DKA in ED with glucose >600 and altered mental status concerning for worsening UTI in the setting of BCG.  Assessment & Plan:   DKA (diabetic ketoacidosis) ([place]), resolved Uncontrolled type 2 diabetes mellitus with hyperglycemia ([place]), ongoing Acute metabolic encephalopathy, resolving  - Anion gap closed, tolerating long-acting insulin quite well, increase p.o. intake appropriately over the past 24 hours - A1c 8.3 consistent with uncontrolled diabetes mellitus with hyperglycemia. Not insulin-dependent at home, previously diet-controlled diabetes per family (glimepiride on med rec) -will likely need at least glargine insulin at discharge. - Hyperglycemia likely acutely worsened in the setting of UTI as below due to BCG and noncompliance with home antibiotics. - Continue diabetic diet, advance as tolerated.  Patient educated to avoid the keto diet as well.  Sepsis UTI, POA - Ongoing BCG infection - ID sidelined, recommend to continue current RIPE therapy per protocol - Follow cultures - DC aztreonam given this is likely recurrent/subacute infection in the setting of noncompliance with home antibiotics  AKI  (acute kidney injury) on stage IIIa CKD in the presence of severe bilateral chronic hydroureteronephrosis - No obstruction seen on imaging. -Creatinine approaching baseline around 2.3 -P.o. intake improved drastically over the past 24 hours, DC IV fluids. - Monitor intake and output. - Avoid hypotension.  Trigeminy - Continue metoprolol 2.5 mg every 8 hours. - Rate currently well controlled - Echo unremarkable for wall motion abnormality, EF 60-65%   Chronic anemia of chronic disease -Near baseline, likely somewhat hemodilution all otherwise no acute process no signs or symptoms of bleeding  Hyperkalemia, resolved - Follow repeat labs   Thrombocytosis, resolved - Secondary to hemoconcentration.   Essential hypertension Currently well controlled Not currently on any home medications, on metoprolol IV 5 mg every 8 hours as needed.   Hyperphosphatemia Follow level after hydration.   Hypermagnesemia Follow-up level after hydration.   DVT prophylaxis:      Lovenox SQ. Code Status:              Full code. Family Communication: At bedside  Status is: Inpatient  Dispo: The patient is from: Home              Anticipated d/c is to: To be determined              Anticipated d/c date is: 24-48 hours              Patient currently not medically stable for discharge  Consultants:  None  Procedures:  None  Antimicrobials:  RIPE, poa   Subjective: No acute issues or events overnight, patient much more awake alert oriented this morning much more appropriate per family at bedside, patient denies  nausea vomiting diarrhea constipation headache fevers chills or chest pain.  Dysuria ongoing, improving, patient notes occasional odynophagia with solids but tolerating liquid and soft diet quite well.  Objective: Vitals:   09/22/21 0100 09/22/21 0200 09/22/21 0300 09/22/21 0400  BP: (!) 135/59 (!) 138/58 100/63 131/68  Pulse: 84 87 88 93  Resp:    10  Temp:    98.2 F (36.8 C)   TempSrc:    Oral  SpO2: 98% 100% 98% 96%  Weight:      Height:        Intake/Output Summary (Last 24 hours) at 09/22/2021 0708 Last data filed at 09/22/2021 9371 Gross per 24 hour  Intake 2237.12 ml  Output 2100 ml  Net 137.12 ml    Filed Weights   09/20/21 1007 09/20/21 1700  Weight: 80 kg 71.4 kg    Examination:  General:  Pleasantly resting in bed, No acute distress. HEENT:  Normocephalic atraumatic.  Sclerae nonicteric, noninjected.  Extraocular movements intact bilaterally. Neck:  Without mass or deformity.  Trachea is midline. Lungs:  Clear to auscultate bilaterally without rhonchi, wheeze, or rales. Heart:  Regular rate and rhythm.  Without murmurs, rubs, or gallops. Abdomen:  Soft, nontender, nondistended.  Without guarding or rebound. Extremities: Without cyanosis, clubbing, edema, or obvious deformity. Vascular:  Dorsalis pedis and posterior tibial pulses palpable bilaterally. Skin:  Warm and dry, no erythema, no ulcerations.  Data Reviewed: I have personally reviewed following labs and imaging studies  CBC: Recent Labs  Lab 09/20/21 1019 09/20/21 1047 09/22/21 0229  WBC 21.2*  --  13.8*  NEUTROABS 19.0*  --   --   HGB 11.5* 11.9* 10.2*  HCT 34.8* 35.0* 30.3*  MCV 87.7  --  86.1  PLT 464*  --  696    Basic Metabolic Panel: Recent Labs  Lab 09/20/21 1412 09/20/21 1413 09/20/21 2116 09/21/21 0122 09/21/21 0429 09/22/21 0229  NA  --  120* 134* 133* 134* 134*  K  --  5.0 4.8 4.9 5.0 5.0  CL  --  91* 102 104 106 107  CO2  --  17* 17* 19* 16* 18*  GLUCOSE  --  1,101* 245* 125* 148* 225*  BUN  --  156* 146* 143* 144* 122*  CREATININE  --  4.46* 3.98* 3.44* 3.64* 3.12*  CALCIUM  --  8.3* 9.4 9.1 8.8* 8.9  MG 3.2*  --   --   --   --   --   PHOS 6.5*  --   --   --   --   --     GFR: Estimated Creatinine Clearance: 22.2 mL/min (A) (by C-G formula based on SCr of 3.12 mg/dL (H)). Liver Function Tests: No results for input(s): AST, ALT, ALKPHOS,  BILITOT, PROT, ALBUMIN in the last 168 hours. No results for input(s): LIPASE, AMYLASE in the last 168 hours. No results for input(s): AMMONIA in the last 168 hours. Coagulation Profile: No results for input(s): INR, PROTIME in the last 168 hours. Cardiac Enzymes: No results for input(s): CKTOTAL, CKMB, CKMBINDEX, TROPONINI in the last 168 hours. BNP (last 3 results) No results for input(s): PROBNP in the last 8760 hours. HbA1C: No results for input(s): HGBA1C in the last 72 hours. CBG: Recent Labs  Lab 09/21/21 0905 09/21/21 1000 09/21/21 1114 09/21/21 1230 09/21/21 1625  GLUCAP 146* 146* 175* 162* 182*    Lipid Profile: No results for input(s): CHOL, HDL, LDLCALC, TRIG, CHOLHDL, LDLDIRECT in the last 72 hours. Thyroid  Function Tests: No results for input(s): TSH, T4TOTAL, FREET4, T3FREE, THYROIDAB in the last 72 hours. Anemia Panel: No results for input(s): VITAMINB12, FOLATE, FERRITIN, TIBC, IRON, RETICCTPCT in the last 72 hours. Sepsis Labs: Recent Labs  Lab 09/20/21 1813  PROCALCITON 1.20     Recent Results (from the past 240 hour(s))  Urine Culture     Status: Abnormal   Collection Time: 09/20/21 10:35 AM   Specimen: Urine, Clean Catch  Result Value Ref Range Status   Specimen Description   Final    URINE, CLEAN CATCH Performed at [facility], [place] [address] [place], [place] [address]    Special Requests   Final    NONE Performed at [facility], [place] [address] [place], [place] [address]    Culture (A)  Final    <10,000 COLONIES/mL INSIGNIFICANT GROWTH Performed at [place] [address], [place], [place] [address]    Report Status 09/21/2021 FINAL  Final  MRSA Next Gen by PCR, Nasal     Status: None   Collection Time: 09/20/21  3:40 PM   Specimen: Nasal Mucosa; Nasal Swab  Result Value Ref Range Status   MRSA by PCR Next Gen NOT DETECTED NOT DETECTED Final    Comment: (NOTE) The GeneXpert MRSA  Assay (FDA approved for NASAL specimens only), is one component of a comprehensive MRSA colonization surveillance program. It is not intended to diagnose MRSA infection nor to guide or monitor treatment for MRSA infections. Test performance is not FDA approved in patients less than 66 years old. Performed at [facility], [place] [address], [place], [place] [address]   Resp Panel by RT-PCR (Flu A&B, Covid) Nasal Mucosa     Status: None   Collection Time: 09/20/21  3:41 PM   Specimen: Nasal Mucosa; Nasopharyngeal(NP) swabs in vial transport medium  Result Value Ref Range Status   SARS Coronavirus 2 by RT PCR NEGATIVE NEGATIVE Final    Comment: (NOTE) SARS-CoV-2 target nucleic acids are NOT DETECTED.  The SARS-CoV-2 RNA is generally detectable in upper respiratory specimens during the acute phase of infection. The lowest concentration of SARS-CoV-2 viral copies this assay can detect is 138 copies/mL. A negative result does not preclude SARS-Cov-2 infection and should not be used as the sole basis for treatment or other patient management decisions. A negative result may occur with  improper specimen collection/handling, submission of specimen other than nasopharyngeal swab, presence of viral mutation(s) within the areas targeted by this assay, and inadequate number of viral copies(<138 copies/mL). A negative result must be combined with clinical observations, patient history, and epidemiological information. The expected result is Negative.  Fact Sheet for Patients:  EntrepreneurPulse.com.au  Fact Sheet for Healthcare Providers:  IncredibleEmployment.be  This test is no t yet approved or cleared by the [place] FDA and  has been authorized for detection and/or diagnosis of SARS-CoV-2 by FDA under an Emergency Use Authorization (EUA). This EUA will remain  in effect (meaning this test can be used) for the duration of  the COVID-19 declaration under Section 564(b)(1) of the Act, 21 U.S.C.section 360bbb-3(b)(1), unless the authorization is terminated  or revoked sooner.       Influenza A by PCR NEGATIVE NEGATIVE Final   Influenza B by PCR NEGATIVE NEGATIVE Final    Comment: (NOTE) The Xpert Xpress SARS-CoV-2/FLU/RSV plus assay is intended as an aid in the diagnosis of influenza from Nasopharyngeal swab specimens and should not be used as  a sole basis for treatment. Nasal washings and aspirates are unacceptable for Xpert Xpress SARS-CoV-2/FLU/RSV testing.  Fact Sheet for Patients: EntrepreneurPulse.com.au  Fact Sheet for Healthcare Providers: IncredibleEmployment.be  This test is not yet approved or cleared by the [place] FDA and has been authorized for detection and/or diagnosis of SARS-CoV-2 by FDA under an Emergency Use Authorization (EUA). This EUA will remain in effect (meaning this test can be used) for the duration of the COVID-19 declaration under Section 564(b)(1) of the Act, 21 U.S.C. section 360bbb-3(b)(1), unless the authorization is terminated or revoked.  Performed at [facility], [place] [address], [place], [place] [address]       Radiology Studies: CT ABDOMEN PELVIS WO CONTRAST  Result Date: 09/20/2021 CLINICAL DATA:  Weakness and lethargy. Not eating or drinking x2 days. History low-grade bladder cancer treated with TURBT and BCG. 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 EXAM: CT ABDOMEN AND PELVIS WITHOUT CONTRAST TECHNIQUE: Multidetector CT imaging of the abdomen and pelvis was performed following the standard protocol without IV contrast. COMPARISON:  CT abdomen pelvis 08/09/2021 FINDINGS: Lower chest: No acute abnormality. Hepatobiliary: A 0.6 cm oval circumscribed hypodensity at the dome of the liver is  stable dating back to 07/28/2018, likely benign hepatic cyst (series 2, image 11). Status post cholecystectomy. No biliary dilatation. Pancreas: Unremarkable. No pancreatic ductal dilatation or surrounding inflammatory changes. Spleen: Normal in size without focal abnormality. Adrenals/Urinary Tract: Adrenal glands are unremarkable. Redemonstration of severe bilateral hydroureteronephrosis. No nephrolithiasis or perinephric fat stranding. Previously noted tiny left renal cortical cyst is not appreciated. No new renal mass. Urinary bladder is mildly distended with mild diffuse wall thickening measuring 0.4 cm and mild adjacent fat stranding (series 2, image 70. Stomach/Bowel: Diffuse thickening of the distal esophagus, which can be seen in the setting of reflux. There is a 3 cm area of thickening versus mass in the gastric fundus (series 2, image 14), new compared to CT 07/28/2018. Small bowel feces sign is seen within the distal ileum, consistent with slow transit. No dilatation of the small bowel. Stool is seen within the ascending colon with hard formed stools noted in transverse colon and scattered throughout the descending colon. Scattered diverticula in the sigmoid colon. No inflammatory changes of the bowel. Vascular/Lymphatic: Minimal scattered aortoiliac calcific atherosclerosis. No abdominal aortic aneurysm. No enlarged abdominal or pelvic lymph nodes. The previously noted small perirectal lymph node seen on 08/06/2021 are not appreciated on today's exam. Reproductive: Postoperative changes of resection. Other: Small bilateral fat containing inguinal hernias. Musculoskeletal: Stable sclerotic lesions in the right iliac bone and left acetabulum, unchanged compared to 07/28/2018. multilevel degenerative disc disease in the lumbar spine, most severe at L1-L2 with vacuum disc phenomenon. No suspicious osseous lesion. IMPRESSION: 1. Diffuse mild wall thickening of the urinary bladder with mild adjacent fat  stranding, suggestive of recurrent cystitis. 2. Unchanged bilateral severe hydroureteronephrosis. No nephrolithiasis or perinephric fat stranding. 3. In the gastric fundus, there is a 3 cm mass versus area of focal thickening. Recommend further evaluation with EGD for direct visualization. 4. Hard formed stools in the transverse and descending colon with small bowel feces sign in the distal ileum, indicative of slow transit. No bowel obstruction. Electronically Signed   By: Ileana Roup M.D.   On: 09/20/2021 12:44   ECHOCARDIOGRAM COMPLETE  Result Date: 09/21/2021    ECHOCARDIOGRAM REPORT   Patient Name:   Adam Chung  Date of Exam: 09/21/2021 Medical Rec #:  [id number]         Height:       71.0 in Accession #:    [id number]        Weight:       157.4 lb Date of Birth:  06-Jul-1951         BSA:          1.905 m Patient Age:    11 years          BP:           119/67 mmHg Patient Gender: M                 HR:           96 bpm. Exam Location:  Inpatient Procedure: 2D Echo, Cardiac Doppler, Color Doppler and Intracardiac            Opacification Agent Indications:    Trigeminy [[id number]]  History:        Patient has no prior history of Echocardiogram examinations.                 Risk Factors:Diabetes. Cancer.  Sonographer:    Darlina Sicilian RDCS Referring Phys: [id number] DAVID MANUEL Ila  Sonographer Comments: Suboptimal parasternal window and suboptimal apical window. IMPRESSIONS  1. Technically difficult echo with poor image quality.  2. Left ventricular ejection fraction, by estimation, is 60 to 65%. The left ventricle has normal function. The left ventricle has no regional wall motion abnormalities. Left ventricular diastolic parameters were normal.  3. Right ventricular systolic function is mildly reduced. The right ventricular size is normal.  4. The mitral valve is grossly normal. No evidence of mitral valve regurgitation.  5. The aortic valve is normal in structure. Aortic valve regurgitation is not  visualized. No aortic stenosis is present. FINDINGS  Left Ventricle: Left ventricular ejection fraction, by estimation, is 60 to 65%. The left ventricle has normal function. The left ventricle has no regional wall motion abnormalities. Definity contrast agent was given IV to delineate the left ventricular  endocardial borders. The left ventricular internal cavity size was normal in size. There is no left ventricular hypertrophy. Left ventricular diastolic parameters were normal. Right Ventricle: The right ventricular size is normal. Right vetricular wall thickness was not well visualized. Right ventricular systolic function is mildly reduced. Left Atrium: Left atrial size was normal in size. Right Atrium: Right atrial size was normal in size. Pericardium: There is no evidence of pericardial effusion. Mitral Valve: The mitral valve is grossly normal. No evidence of mitral valve regurgitation. Tricuspid Valve: The tricuspid valve is not well visualized. Tricuspid valve regurgitation is trivial. Aortic Valve: The aortic valve is normal in structure. Aortic valve regurgitation is not visualized. No aortic stenosis is present. Pulmonic Valve: The pulmonic valve was not well visualized. Pulmonic valve regurgitation is not visualized. Aorta: The aortic root and ascending aorta are structurally normal, with no evidence of dilitation. IAS/Shunts: The atrial septum is grossly normal. Additional Comments: Technically difficult echo with poor image quality.  LEFT VENTRICLE PLAX 2D LVOT diam:     2.10 cm   Diastology LV SV:         59        LV e' medial:    5.19 cm/s LV SV Index:   31        LV E/e' medial:  12.8 LVOT Area:     3.46 cm  LV e' lateral:  7.51 cm/s                          LV E/e' lateral: 8.8  RIGHT VENTRICLE RV S prime:     11.40 cm/s TAPSE (M-mode): 1.6 cm LEFT ATRIUM             Index LA Vol (A2C):   19.9 ml 10.45 ml/m LA Vol (A4C):   19.6 ml 10.29 ml/m LA Biplane Vol: 20.4 ml 10.71 ml/m  AORTIC VALVE  LVOT Vmax:   80.80 cm/s LVOT Vmean:  57.000 cm/s LVOT VTI:    0.170 m  AORTA Ao Root diam: 3.50 cm MITRAL VALVE MV Area (PHT): 3.63 cm    SHUNTS MV Decel Time: 209 msec    Systemic VTI:  0.17 m MV E velocity: 66.40 cm/s  Systemic Diam: 2.10 cm MV A velocity: 93.00 cm/s MV E/A ratio:  0.71 Mertie Moores MD Electronically signed by Mertie Moores MD Signature Date/Time: 09/21/2021/2:08:38 PM    Final     Scheduled Meds:  Chlorhexidine Gluconate Cloth  6 each Topical Q0600   ethambutol  1,200 mg Oral Daily   heparin injection (subcutaneous)  5,000 Units Subcutaneous Q8H   insulin aspart  0-15 Units Subcutaneous TID WC   insulin aspart  0-5 Units Subcutaneous QHS   insulin glargine-yfgn  30 Units Subcutaneous Daily   isoniazid  300 mg Oral Daily   mouth rinse  15 mL Mouth Rinse BID   metoprolol tartrate  5 mg Intravenous Q8H   pyridOXINE  50 mg Oral Daily   rifampin  600 mg Oral Daily   tamsulosin  0.4 mg Oral Daily   Continuous Infusions:  dextrose 5% lactated ringers Stopped (09/21/21 1239)   lactated ringers 125 mL/hr at 09/20/21 1730     LOS: 2 days   Time spent: 56min  Jerrol Helmers C Samira Acero, DO [facility]  If 7PM-7AM, please contact night-coverage [email or web address]  09/22/2021, 7:08 AM

## 2021-09-22 NOTE — Progress Notes (Signed)
Inpatient Diabetes Program Recommendations  AACE/ADA: New Consensus Statement on Inpatient Glycemic Control (2015)  Target Ranges:  Prepandial:   less than 140 mg/dL      Peak postprandial:   less than 180 mg/dL (1-2 hours)      Critically ill patients:  140 - 180 mg/dL   Lab Results  Component Value Date   GLUCAP 233 (H) 09/22/2021   HGBA1C 8.3 (H) 08/07/2021    Review of Glycemic Control  Diabetes history: DM 2 Outpatient Diabetes medications: Amaryl 4 mg Daily Current orders for Inpatient glycemic control:  IV insulin transitioning to  Semglee 30 units Daily Novolog 0-15 units + hs  Inpatient Diabetes Program Recommendations:    Fasting glucose 233. -  Consider increasing Semglee to 35 units  Will follow pt while here.  Thanks,  Tama Headings RN, MSN, BC-ADM Inpatient Diabetes Coordinator Team Pager [phone] (8a-5p)

## 2021-09-23 DIAGNOSIS — I10 Essential (primary) hypertension: Secondary | ICD-10-CM | POA: Diagnosis not present

## 2021-09-23 LAB — BASIC METABOLIC PANEL
Anion gap: 10 (ref 5–15)
BUN: 101 mg/dL — ABNORMAL HIGH (ref 8–23)
CO2: 17 mmol/L — ABNORMAL LOW (ref 22–32)
Calcium: 8.9 mg/dL (ref 8.9–10.3)
Chloride: 108 mmol/L (ref 98–111)
Creatinine, Ser: 2.9 mg/dL — ABNORMAL HIGH (ref 0.61–1.24)
GFR, Estimated: 23 mL/min — ABNORMAL LOW (ref >60)
Glucose, Bld: 144 mg/dL — ABNORMAL HIGH (ref 70–99)
Potassium: 4.8 mmol/L (ref 3.5–5.1)
Sodium: 135 mmol/L (ref 135–145)

## 2021-09-23 LAB — CBC
HCT: 30.2 % — ABNORMAL LOW (ref 39.0–52.0)
Hemoglobin: 10 g/dL — ABNORMAL LOW (ref 13.0–17.0)
MCH: 29 pg (ref 26.0–34.0)
MCHC: 33.1 g/dL (ref 30.0–36.0)
MCV: 87.5 fL (ref 80.0–100.0)
Platelets: 211 10*3/uL (ref 150–400)
RBC: 3.45 MIL/uL — ABNORMAL LOW (ref 4.22–5.81)
RDW: 15.1 % (ref 11.5–15.5)
WBC: 7.7 10*3/uL (ref 4.0–10.5)
nRBC: 0 % (ref 0.0–0.2)

## 2021-09-23 LAB — GLUCOSE, CAPILLARY
Glucose-Capillary: 158 mg/dL — ABNORMAL HIGH (ref 70–99)
Glucose-Capillary: 186 mg/dL — ABNORMAL HIGH (ref 70–99)
Glucose-Capillary: 207 mg/dL — ABNORMAL HIGH (ref 70–99)
Glucose-Capillary: 160 mg/dL — ABNORMAL HIGH (ref 70–99)

## 2021-09-23 LAB — HEMOGLOBIN A1C
Hgb A1c MFr Bld: 12.5 % — ABNORMAL HIGH (ref 4.8–5.6)
Mean Plasma Glucose: 312.05 mg/dL

## 2021-09-23 MED ORDER — HEPARIN SODIUM (PORCINE) 5000 UNIT/ML IJ SOLN
5000.0000 [IU] | Freq: Three times a day (TID) | INTRAMUSCULAR | Status: DC
  Administered 2021-09-24 – 2021-09-30 (×17): 5000 [IU] via SUBCUTANEOUS
  Filled 2021-09-23 (×17): qty 1

## 2021-09-23 MED ORDER — SODIUM CHLORIDE 0.9 % IV SOLN: INTRAVENOUS | Status: DC

## 2021-09-23 NOTE — TOC Progression Note (Signed)
[facility] - Progression Note    Patient Details  Name: Adam Chung MRN: [id number] Date of Birth: [birth year]  [facility] CM/SW Contact  Purcell Mouton, RN Phone Number: 09/23/2021, 12:43 PM  Clinical Narrative:     TOC reviewed Chart and will continue to follow for discharge needs.   Expected Discharge Plan: Home/Self Care Barriers to Discharge: No Barriers Identified  Expected Discharge Plan and Services Expected Discharge Plan: Home/Self Care       Living arrangements for the past 2 months: Single Family Home                                       Social Determinants of Health (SDOH) Interventions    Readmission Risk Interventions No flowsheet data found.

## 2021-09-23 NOTE — H&P (View-Only) (Signed)
Referring Provider: Dr. Antonieta Pert Primary Care Physician:  Rusty Aus, MD Primary Gastroenterologist:  Althia Forts  Reason for Consultation:  Gastric fundus thickening  HPI: Adam Chung is a 70 y.o. male with significant medical history for CKD stage IIIa, type 2 diabetes, chronic anemia, history of bladder cancer on BCG injections developing a bladder infection. Patient has been in the hospital since 09/20/2021 with altered mental status, found to have DKA urosepsis. 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  Of note patient did have CT abdomen pelvis without contrast 08/09/2021 that did not demonstrate any gastric thickening. Patient has had about a 20 pound weight lost per the daughter in about a month. Patient denies reflux, throat clearing, dysphagia, melena. Patient denies abdominal pain but was having some abdominal discomfort on this admission. Patient normally has bowel movement every 2 to 3 days, last bowel movement was Friday to Saturday.  Denies melena or hematochezia.  Did have some orange-colored stools but patient is on rifampin. Patient denies NSAID use, alcohol history, remote smoking history 35 years ago. Patient denies family history of GI malignancy.   CT AB and Pelvis without contrast 09/20/2021  IMPRESSION: 1. Diffuse mild wall thickening of the urinary bladder with mild adjacent fat stranding, suggestive of recurrent cystitis. 2. Unchanged bilateral severe hydroureteronephrosis. No nephrolithiasis or perinephric fat stranding. 3. In the gastric fundus, there is a 3 cm mass versus area of focal thickening. Recommend further evaluation with EGD for direct visualization. 4. Hard formed stools in the transverse and descending colon with small bowel feces sign in the distal ileum, indicative of slow transit. No bowel obstruction.  CT AB and PELVIS without  contrast 08/09/2021 IMPRESSION: 1. Severe bilateral hydronephrosis unchanged. 2. The bladder is decompressed with marked diffuse thickened wall with a Foley catheter in place. 3. Aortic atherosclerosis.  Past Medical History:  Diagnosis Date   Cancer ([place])    bladder   Diabetes mellitus without complication ([place])     Past Surgical History:  Procedure Laterality Date   BLADDER SURGERY     CYSTOSCOPY N/A 08/09/2021   Procedure: CYSTOSCOPY WITH INSERTION OF FOLEY CATHETER;  Surgeon: Janith Lima, MD;  Location: [facility] ORS;  Service: Urology;  Laterality: N/A;   fatty tumor excision     TRANSURETHRAL RESECTION OF PROSTATE N/A 08/09/2021   Procedure: TRANSURETHRAL RESECTION OF THE PROSTATE (TURP);  Surgeon: Janith Lima, MD;  Location: [facility] ORS;  Service: Urology;  Laterality: N/A;    Prior to Admission medications   Medication Sig Start Date End Date Taking? Authorizing Provider  Cholecalciferol 50 MCG (2000 UT) TABS Take 3,000 Units by mouth daily.   Yes [provider]  ciprofloxacin (CIPRO) 500 MG tablet Take 1 tablet (500 mg total) by mouth 2 (two) times daily. 08/20/21  Yes Tsosie Billing, MD  ethambutol (MYAMBUTOL) 400 MG tablet Take 3 tablets (1,200 mg total) by mouth daily. 08/24/21  Yes Tsosie Billing, MD  glimepiride (AMARYL) 4 MG tablet Take 4 mg by mouth daily with breakfast.   Yes [provider]  isoniazid (NYDRAZID) 300 MG tablet Take 1 tablet (300 mg total) by mouth daily. 08/24/21  Yes Tsosie Billing, MD  ondansetron (ZOFRAN ODT) 8 MG disintegrating tablet Take 1 tablet (8 mg total) by mouth every 8 (eight) hours as needed for nausea or vomiting. 09/10/21  Yes Kuppelweiser, Cassie L, RPH-CPP  pyridOXINE (B-6) 50 MG  tablet Take 1 tablet (50 mg total) by mouth daily. 08/24/21  Yes Tsosie Billing, MD  rifampin (RIFADIN) 300 MG capsule Take 2 capsules (600 mg total) by mouth daily. 08/24/21  Yes Tsosie Billing, MD   tamsulosin (FLOMAX) 0.4 MG CAPS capsule Take 0.4 mg by mouth daily.   Yes [provider]    Scheduled Meds:  Chlorhexidine Gluconate Cloth  6 each Topical Q0600   ethambutol  1,200 mg Oral Daily   heparin injection (subcutaneous)  5,000 Units Subcutaneous Q8H   insulin aspart  0-15 Units Subcutaneous TID WC   insulin aspart  0-5 Units Subcutaneous QHS   insulin glargine-yfgn  30 Units Subcutaneous Daily   isoniazid  300 mg Oral Daily   mouth rinse  15 mL Mouth Rinse BID   pyridOXINE  50 mg Oral Daily   rifampin  600 mg Oral Daily   tamsulosin  0.4 mg Oral Daily   Continuous Infusions:  sodium chloride     dextrose 5% lactated ringers Stopped (09/21/21 1239)   lactated ringers 125 mL/hr at 09/20/21 1730   PRN Meds:.acetaminophen **OR** acetaminophen, dextrose, metoprolol tartrate, ondansetron **OR** ondansetron (ZOFRAN) IV, simethicone  Allergies as of 09/20/2021 - Review Complete 09/20/2021  Allergen Reaction Noted   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 No pertinent family history.  Social History   Socioeconomic History   Marital status: Married    Spouse name: Not on file   Number of children: Not on file   Years of education: Not on file   Highest education level: Not on file  Occupational History   Not on file  Tobacco Use   Smoking status: Former   Smokeless tobacco: Never  Substance and Sexual Activity   Alcohol use: Not Currently   Drug use: Not on file   Sexual activity: Not on file  Other Topics Concern   Not on file  Social History Narrative   Not on file   Social Determinants of Health   Financial Resource Strain: Not on file  Food Insecurity: Not on file  Transportation Needs: Not on file  Physical Activity: Not on file  Stress: Not on file  Social Connections: Not on file  Intimate Partner Violence: Not on file    Review of Systems:  Review of Systems  Constitutional:  Positive  for malaise/fatigue and weight loss. Negative for chills and fever.  HENT:  Negative for sore throat.   Respiratory:  Negative for cough and shortness of breath.   Cardiovascular:  Negative for chest pain and leg swelling.  Gastrointestinal:  Positive for constipation. Negative for abdominal pain, blood in stool, diarrhea, heartburn, melena, nausea and vomiting.  Musculoskeletal:  Negative for falls.  Skin:  Negative for itching.  Neurological:  Negative for loss of consciousness.  Psychiatric/Behavioral:  Negative for memory loss.     Physical Exam: Vital signs: Vitals:   09/23/21 0700 09/23/21 0800  BP:  130/63  Pulse:    Resp: 10 10  Temp: 98.3 F (36.8 C)   SpO2:     Last BM Date:  (pta) Physical Exam  General:   Alert, thin, ill appearing male, lying comfortably in bed, in NAD Heart:  Regular rate and rhythm; no murmurs Pulm: Clear anteriorly; no wheezing Abdomen:  Soft, Flat AB, skin exam normal, Normal bowel sounds.  no  tenderness  on exam . Without guarding and Without rebound, without hepatomegaly. Extremities:  Without edema. Neurologic:  Alert and  oriented x4;  grossly normal neurologically. Psych:  Alert and cooperative. Normal mood and affect.   GI:  Lab Results: Recent Labs    09/20/21 1019 09/20/21 1047 09/22/21 0229 09/23/21 0242  WBC 21.2*  --  13.8* 7.7  HGB 11.5* 11.9* 10.2* 10.0*  HCT 34.8* 35.0* 30.3* 30.2*  PLT 464*  --  234 211   BMET Recent Labs    09/21/21 0429 09/22/21 0229 09/23/21 0242  NA 134* 134* 135  K 5.0 5.0 4.8  CL 106 107 108  CO2 16* 18* 17*  GLUCOSE 148* 225* 144*  BUN 144* 122* 101*  CREATININE 3.64* 3.12* 2.90*  CALCIUM 8.8* 8.9 8.9   LFT No results for input(s): PROT, ALBUMIN, AST, ALT, ALKPHOS, BILITOT, BILIDIR, IBILI in the last 72 hours. PT/INR No results for input(s): LABPROT, INR in the last 72 hours.  Studies/Results: ECHOCARDIOGRAM COMPLETE  Result Date: 09/21/2021    ECHOCARDIOGRAM REPORT    Patient Name:   MJ WILLIS Date of Exam: 09/21/2021 Medical Rec #:  [id number]         Height:       71.0 in Accession #:    [id number]        Weight:       157.4 lb Date of Birth:  01/07/1951         BSA:          1.905 m Patient Age:    3 years          BP:           119/67 mmHg Patient Gender: M                 HR:           96 bpm. Exam Location:  Inpatient Procedure: 2D Echo, Cardiac Doppler, Color Doppler and Intracardiac            Opacification Agent Indications:    Trigeminy [[id number]]  History:        Patient has no prior history of Echocardiogram examinations.                 Risk Factors:Diabetes. Cancer.  Sonographer:    Darlina Sicilian RDCS Referring Phys: [id number] DAVID MANUEL [place]  Sonographer Comments: Suboptimal parasternal window and suboptimal apical window. IMPRESSIONS  1. Technically difficult echo with poor image quality.  2. Left ventricular ejection fraction, by estimation, is 60 to 65%. The left ventricle has normal function. The left ventricle has no regional wall motion abnormalities. Left ventricular diastolic parameters were normal.  3. Right ventricular systolic function is mildly reduced. The right ventricular size is normal.  4. The mitral valve is grossly normal. No evidence of mitral valve regurgitation.  5. The aortic valve is normal in structure. Aortic valve regurgitation is not visualized. No aortic stenosis is present. FINDINGS  Left Ventricle: Left ventricular ejection fraction, by estimation, is 60 to 65%. The left ventricle has normal function. The left ventricle has no regional wall motion abnormalities. Definity contrast agent was given IV to delineate the left ventricular  endocardial borders. The left ventricular internal cavity size was normal in size. There is no left ventricular hypertrophy. Left ventricular diastolic parameters were normal. Right Ventricle: The right ventricular size is normal. Right vetricular wall thickness was not well visualized. Right  ventricular systolic function is mildly reduced. Left Atrium: Left atrial size was normal in size. Right Atrium: Right atrial size was normal in size. Pericardium: There is no  evidence of pericardial effusion. Mitral Valve: The mitral valve is grossly normal. No evidence of mitral valve regurgitation. Tricuspid Valve: The tricuspid valve is not well visualized. Tricuspid valve regurgitation is trivial. Aortic Valve: The aortic valve is normal in structure. Aortic valve regurgitation is not visualized. No aortic stenosis is present. Pulmonic Valve: The pulmonic valve was not well visualized. Pulmonic valve regurgitation is not visualized. Aorta: The aortic root and ascending aorta are structurally normal, with no evidence of dilitation. IAS/Shunts: The atrial septum is grossly normal. Additional Comments: Technically difficult echo with poor image quality.  LEFT VENTRICLE PLAX 2D LVOT diam:     2.10 cm   Diastology LV SV:         59        LV e' medial:    5.19 cm/s LV SV Index:   31        LV E/e' medial:  12.8 LVOT Area:     3.46 cm  LV e' lateral:   7.51 cm/s                          LV E/e' lateral: 8.8  RIGHT VENTRICLE RV S prime:     11.40 cm/s TAPSE (M-mode): 1.6 cm LEFT ATRIUM             Index LA Vol (A2C):   19.9 ml 10.45 ml/m LA Vol (A4C):   19.6 ml 10.29 ml/m LA Biplane Vol: 20.4 ml 10.71 ml/m  AORTIC VALVE LVOT Vmax:   80.80 cm/s LVOT Vmean:  57.000 cm/s LVOT VTI:    0.170 m  AORTA Ao Root diam: 3.50 cm MITRAL VALVE MV Area (PHT): 3.63 cm    SHUNTS MV Decel Time: 209 msec    Systemic VTI:  0.17 m MV E velocity: 66.40 cm/s  Systemic Diam: 2.10 cm MV A velocity: 93.00 cm/s MV E/A ratio:  0.71 Mertie Moores MD Electronically signed by Mertie Moores MD Signature Date/Time: 09/21/2021/2:08:38 PM    Final     Impression and Plan Abnormal CT AB and Pelvis without contrast  - focal thickening about 3 cm at gastric fundus compared to 08/09/2021 that did not demonstrate any gastric thickening. -no UGI  symptoms at this time. -With comparison CT 08/09/2021 without any gastric thickening most likely this can be artifact from CT without contrast versus inflammation however patient's not having symptoms at this time, less likely gastric mass however we will schedule for endoscopy to evaluate. -I thoroughly discussed the procedure with the patient (at bedside) to include nature of the procedure, alternatives, benefits, and risks (including but not limited to bleeding, infection, perforation, anesthesia/cardiac pulmonary complications).  Patient verbalized understanding and gave verbal consent to proceed with EGD - Full liquids today -NPO at midnight - Hold heparin day of the procedure.   Bladder cancer s/p TURP, BCG therapy Anemia of chronic disease Urosepsis DKA Type 2 Diabetes   LOS: 3 days   Vladimir Crofts  PA-C 09/23/2021, 9:59 AM  Contact #  [phone]

## 2021-09-23 NOTE — Evaluation (Signed)
Physical Therapy Evaluation Patient Details Name: Adam Chung MRN: [id number] DOB: [birth year] Today's Date: 09/23/2021  History of Present Illness  [name], 70 y.o. male with PMH of CKD3a, DM, bladder cancer,a bladder infection ,brought to the ED for evaluation of confusion, metabolic encephalopathy, DKA.  Clinical Impression  The patient presents with generalized weakness. Patient's daughter present. Patient lives alone, family not readily availble. Depending on progress, patient may benefit from short post acute reahab. Pt admitted with above diagnosis.  Pt currently with functional limitations due to the deficits listed below (see PT Problem List). Pt will benefit from skilled PT to increase their independence and safety with mobility to allow discharge to the venue listed below.          Recommendations for follow up therapy are one component of a multi-disciplinary discharge planning process, led by the attending physician.  Recommendations may be updated based on patient status, additional functional criteria and insurance authorization.  Follow Up Recommendations Skilled nursing-short term rehab (<3 hours/day)    Assistance Recommended at Discharge None  Functional Status Assessment Patient has had a recent decline in their functional status and demonstrates the ability to make significant improvements in function in a reasonable and predictable amount of time.  Equipment Recommendations  None recommended by PT    Recommendations for Other Services       Precautions / Restrictions Precautions Precautions: Fall      Mobility  Bed Mobility Overal bed mobility: Needs Assistance Bed Mobility: Rolling;Sidelying to Sit Rolling: Min assist Sidelying to sit: Min assist            Transfers Overall transfer level: Needs assistance Equipment used: Rolling walker (2 wheels) Transfers: Sit to/from Stand Sit to Stand: Min assist           General  transfer comment: cues for safety    Ambulation/Gait                Stairs            Wheelchair Mobility    Modified Rankin (Stroke Patients Only)       Balance Overall balance assessment: Needs assistance Sitting-balance support: No upper extremity supported;Feet supported Sitting balance-Leahy Scale: Fair     Standing balance support: During functional activity;Bilateral upper extremity supported Standing balance-Leahy Scale: Fair                               Pertinent Vitals/Pain Pain Assessment: Faces Faces Pain Scale: Hurts even more Pain Location: to urinate Pain Descriptors / Indicators: Burning    Home Living Family/patient expects to be discharged to:: Private residence Living Arrangements: Alone Available Help at Discharge: Available PRN/intermittently;Family Type of Home: Mobile home Home Access: Stairs to enter   Entrance Stairs-Number of Steps: 1   Home Layout: One level Home Equipment: None      Prior Function Prior Level of Function : Independent/Modified Independent                     Hand Dominance        Extremity/Trunk Assessment   Upper Extremity Assessment Upper Extremity Assessment: Generalized weakness    Lower Extremity Assessment Lower Extremity Assessment: Generalized weakness    Cervical / Trunk Assessment Cervical / Trunk Assessment: Normal  Communication   Communication: No difficulties  Cognition Arousal/Alertness: Awake/alert Behavior During Therapy: WFL for tasks assessed/performed Overall Cognitive Status: Impaired/Different from baseline Area  of Impairment: Awareness;Safety/judgement                         Safety/Judgement: Decreased awareness of safety;Decreased awareness of deficits              General Comments      Exercises     Assessment/Plan    PT Assessment Patient needs continued PT services  PT Problem List Decreased strength;Decreased  mobility;Decreased safety awareness;Decreased activity tolerance;Decreased balance;Decreased knowledge of precautions       PT Treatment Interventions DME instruction;Therapeutic activities;Cognitive remediation;Gait training;Therapeutic exercise;Patient/family education;Functional mobility training;Balance training    PT Goals (Current goals can be found in the Care Plan section)  Acute Rehab PT Goals Patient Stated Goal: to walk PT Goal Formulation: With patient/family Time For Goal Achievement: 10/07/21 Potential to Achieve Goals: Good    Frequency Min 2X/week   Barriers to discharge Decreased caregiver support      Co-evaluation               AM-PAC PT "6 Clicks" Mobility  Outcome Measure Help needed turning from your back to your side while in a flat bed without using bedrails?: A Little Help needed moving from lying on your back to sitting on the side of a flat bed without using bedrails?: A Little Help needed moving to and from a bed to a chair (including a wheelchair)?: A Little Help needed standing up from a chair using your arms (e.g., wheelchair or bedside chair)?: A Little Help needed to walk in hospital room?: A Lot Help needed climbing 3-5 steps with a railing? : A Lot 6 Click Score: 16    End of Session   Activity Tolerance: Patient limited by fatigue Patient left: in chair;with call bell/phone within reach;with chair alarm set;with family/visitor present Nurse Communication: Mobility status PT Visit Diagnosis: Unsteadiness on feet (R26.81)    Time: 4888-9169 PT Time Calculation (min) (ACUTE ONLY): 34 min   Charges:   PT Evaluation $PT Eval Low Complexity: 1 Low PT Treatments $Therapeutic Activity: 8-22 mins        [place] Pager [phone] Office [phone]   Claretha Cooper 09/23/2021, 5:54 PM

## 2021-09-23 NOTE — Consult Note (Addendum)
Referring Provider: Dr. Antonieta Pert Primary Care Physician:  Rusty Aus, MD Primary Gastroenterologist:  Althia Forts  Reason for Consultation:  Gastric fundus thickening  HPI: Adam Chung is a 70 y.o. male with significant medical history for CKD stage IIIa, type 2 diabetes, chronic anemia, history of bladder cancer on BCG injections developing a bladder infection. Patient has been in the hospital since 09/20/2021 with altered mental status, found to have DKA urosepsis. 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  Of note patient did have CT abdomen pelvis without contrast 08/09/2021 that did not demonstrate any gastric thickening. Patient has had about a 20 pound weight lost per the daughter in about a month. Patient denies reflux, throat clearing, dysphagia, melena. Patient denies abdominal pain but was having some abdominal discomfort on this admission. Patient normally has bowel movement every 2 to 3 days, last bowel movement was Friday to Saturday.  Denies melena or hematochezia.  Did have some orange-colored stools but patient is on rifampin. Patient denies NSAID use, alcohol history, remote smoking history 35 years ago. Patient denies family history of GI malignancy.   CT AB and Pelvis without contrast 09/20/2021  IMPRESSION: 1. Diffuse mild wall thickening of the urinary bladder with mild adjacent fat stranding, suggestive of recurrent cystitis. 2. Unchanged bilateral severe hydroureteronephrosis. No nephrolithiasis or perinephric fat stranding. 3. In the gastric fundus, there is a 3 cm mass versus area of focal thickening. Recommend further evaluation with EGD for direct visualization. 4. Hard formed stools in the transverse and descending colon with small bowel feces sign in the distal ileum, indicative of slow transit. No bowel obstruction.  CT AB and PELVIS without  contrast 08/09/2021 IMPRESSION: 1. Severe bilateral hydronephrosis unchanged. 2. The bladder is decompressed with marked diffuse thickened wall with a Foley catheter in place. 3. Aortic atherosclerosis.  Past Medical History:  Diagnosis Date   Cancer ([place])    bladder   Diabetes mellitus without complication ([place])     Past Surgical History:  Procedure Laterality Date   BLADDER SURGERY     CYSTOSCOPY N/A 08/09/2021   Procedure: CYSTOSCOPY WITH INSERTION OF FOLEY CATHETER;  Surgeon: Janith Lima, MD;  Location: [facility] ORS;  Service: Urology;  Laterality: N/A;   fatty tumor excision     TRANSURETHRAL RESECTION OF PROSTATE N/A 08/09/2021   Procedure: TRANSURETHRAL RESECTION OF THE PROSTATE (TURP);  Surgeon: Janith Lima, MD;  Location: [facility] OR[facility];  Service: Urology;  Laterality: N/A;    Prior to Admission medications   Medication Sig Start Date End Date Taking? Authorizing Provider  Cholecalciferol 50 MCG (2000 UT) TABS Take 3,000 Units by mouth daily.   Yes [provider]  ciprofloxacin (CIPRO) 500 MG tablet Take 1 tablet (500 mg total) by mouth 2 (two) times daily. 08/20/21  Yes Tsosie Billing, MD  ethambutol (MYAMBUTOL) 400 MG tablet Take 3 tablets (1,200 mg total) by mouth daily. 08/24/21  Yes Tsosie Billing, MD  glimepiride (AMARYL) 4 MG tablet Take 4 mg by mouth daily with breakfast.   Yes [provider]  isoniazid (NYDRAZID) 300 MG tablet Take 1 tablet (300 mg total) by mouth daily. 08/24/21  Yes Tsosie Billing, MD  ondansetron (ZOFRAN ODT) 8 MG disintegrating tablet Take 1 tablet (8 mg total) by mouth every 8 (eight) hours as needed for nausea or vomiting. 09/10/21  Yes Kuppelweiser, Cassie L, RPH-CPP  pyridOXINE (B-6) 50 MG  tablet Take 1 tablet (50 mg total) by mouth daily. 08/24/21  Yes Tsosie Billing, MD  rifampin (RIFADIN) 300 MG capsule Take 2 capsules (600 mg total) by mouth daily. 08/24/21  Yes Tsosie Billing, MD   tamsulosin (FLOMAX) 0.4 MG CAPS capsule Take 0.4 mg by mouth daily.   Yes [provider]    Scheduled Meds:  Chlorhexidine Gluconate Cloth  6 each Topical Q0600   ethambutol  1,200 mg Oral Daily   heparin injection (subcutaneous)  5,000 Units Subcutaneous Q8H   insulin aspart  0-15 Units Subcutaneous TID WC   insulin aspart  0-5 Units Subcutaneous QHS   insulin glargine-yfgn  30 Units Subcutaneous Daily   isoniazid  300 mg Oral Daily   mouth rinse  15 mL Mouth Rinse BID   pyridOXINE  50 mg Oral Daily   rifampin  600 mg Oral Daily   tamsulosin  0.4 mg Oral Daily   Continuous Infusions:  sodium chloride     dextrose 5% lactated ringers Stopped (09/21/21 1239)   lactated ringers 125 mL/hr at 09/20/21 1730   PRN Meds:.acetaminophen **OR** acetaminophen, dextrose, metoprolol tartrate, ondansetron **OR** ondansetron (ZOFRAN) IV, simethicone  Allergies as of 09/20/2021 - Review Complete 09/20/2021  Allergen Reaction Noted   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 No pertinent family history.  Social History   Socioeconomic History   Marital status: Married    Spouse name: Not on file   Number of children: Not on file   Years of education: Not on file   Highest education level: Not on file  Occupational History   Not on file  Tobacco Use   Smoking status: Former   Smokeless tobacco: Never  Substance and Sexual Activity   Alcohol use: Not Currently   Drug use: Not on file   Sexual activity: Not on file  Other Topics Concern   Not on file  Social History Narrative   Not on file   Social Determinants of Health   Financial Resource Strain: Not on file  Food Insecurity: Not on file  Transportation Needs: Not on file  Physical Activity: Not on file  Stress: Not on file  Social Connections: Not on file  Intimate Partner Violence: Not on file    Review of Systems:  Review of Systems  Constitutional:  Positive  for malaise/fatigue and weight loss. Negative for chills and fever.  HENT:  Negative for sore throat.   Respiratory:  Negative for cough and shortness of breath.   Cardiovascular:  Negative for chest pain and leg swelling.  Gastrointestinal:  Positive for constipation. Negative for abdominal pain, blood in stool, diarrhea, heartburn, melena, nausea and vomiting.  Musculoskeletal:  Negative for falls.  Skin:  Negative for itching.  Neurological:  Negative for loss of consciousness.  Psychiatric/Behavioral:  Negative for memory loss.     Physical Exam: Vital signs: Vitals:   09/23/21 0700 09/23/21 0800  BP:  130/63  Pulse:    Resp: 10 10  Temp: 98.3 F (36.8 C)   SpO2:     Last BM Date:  (pta) Physical Exam  General:   Alert, thin, ill appearing male, lying comfortably in bed, in NAD Heart:  Regular rate and rhythm; no murmurs Pulm: Clear anteriorly; no wheezing Abdomen:  Soft, Flat AB, skin exam normal, Normal bowel sounds.  no  tenderness  on exam . Without guarding and Without rebound, without hepatomegaly. Extremities:  Without edema. Neurologic:  Alert and  oriented x4;  grossly normal neurologically. Psych:  Alert and cooperative. Normal mood and affect.   GI:  Lab Results: Recent Labs    09/20/21 1019 09/20/21 1047 09/22/21 0229 09/23/21 0242  WBC 21.2*  --  13.8* 7.7  HGB 11.5* 11.9* 10.2* 10.0*  HCT 34.8* 35.0* 30.3* 30.2*  PLT 464*  --  234 211   BMET Recent Labs    09/21/21 0429 09/22/21 0229 09/23/21 0242  NA 134* 134* 135  K 5.0 5.0 4.8  CL 106 107 108  CO2 16* 18* 17*  GLUCOSE 148* 225* 144*  BUN 144* 122* 101*  CREATININE 3.64* 3.12* 2.90*  CALCIUM 8.8* 8.9 8.9   LFT No results for input(s): PROT, ALBUMIN, AST, ALT, ALKPHOS, BILITOT, BILIDIR, IBILI in the last 72 hours. PT/INR No results for input(s): LABPROT, INR in the last 72 hours.  Studies/Results: ECHOCARDIOGRAM COMPLETE  Result Date: 09/21/2021    ECHOCARDIOGRAM REPORT    Patient Name:   Adam Chung Date of Exam: 09/21/2021 Medical Rec #:  [id number]         Height:       71.0 in Accession #:    [id number]        Weight:       157.4 lb Date of Birth:  11-Dec-1950         BSA:          1.905 m Patient Age:    80 years          BP:           119/67 mmHg Patient Gender: M                 HR:           96 bpm. Exam Location:  Inpatient Procedure: 2D Echo, Cardiac Doppler, Color Doppler and Intracardiac            Opacification Agent Indications:    Trigeminy [[id number]]  History:        Patient has no prior history of Echocardiogram examinations.                 Risk Factors:Diabetes. Cancer.  Sonographer:    Darlina Sicilian RDCS Referring Phys: [id number] DAVID MANUEL [place]  Sonographer Comments: Suboptimal parasternal window and suboptimal apical window. IMPRESSIONS  1. Technically difficult echo with poor image quality.  2. Left ventricular ejection fraction, by estimation, is 60 to 65%. The left ventricle has normal function. The left ventricle has no regional wall motion abnormalities. Left ventricular diastolic parameters were normal.  3. Right ventricular systolic function is mildly reduced. The right ventricular size is normal.  4. The mitral valve is grossly normal. No evidence of mitral valve regurgitation.  5. The aortic valve is normal in structure. Aortic valve regurgitation is not visualized. No aortic stenosis is present. FINDINGS  Left Ventricle: Left ventricular ejection fraction, by estimation, is 60 to 65%. The left ventricle has normal function. The left ventricle has no regional wall motion abnormalities. Definity contrast agent was given IV to delineate the left ventricular  endocardial borders. The left ventricular internal cavity size was normal in size. There is no left ventricular hypertrophy. Left ventricular diastolic parameters were normal. Right Ventricle: The right ventricular size is normal. Right vetricular wall thickness was not well visualized. Right  ventricular systolic function is mildly reduced. Left Atrium: Left atrial size was normal in size. Right Atrium: Right atrial size was normal in size. Pericardium: There is no  evidence of pericardial effusion. Mitral Valve: The mitral valve is grossly normal. No evidence of mitral valve regurgitation. Tricuspid Valve: The tricuspid valve is not well visualized. Tricuspid valve regurgitation is trivial. Aortic Valve: The aortic valve is normal in structure. Aortic valve regurgitation is not visualized. No aortic stenosis is present. Pulmonic Valve: The pulmonic valve was not well visualized. Pulmonic valve regurgitation is not visualized. Aorta: The aortic root and ascending aorta are structurally normal, with no evidence of dilitation. IAS/Shunts: The atrial septum is grossly normal. Additional Comments: Technically difficult echo with poor image quality.  LEFT VENTRICLE PLAX 2D LVOT diam:     2.10 cm   Diastology LV SV:         59        LV e' medial:    5.19 cm/s LV SV Index:   31        LV E/e' medial:  12.8 LVOT Area:     3.46 cm  LV e' lateral:   7.51 cm/s                          LV E/e' lateral: 8.8  RIGHT VENTRICLE RV S prime:     11.40 cm/s TAPSE (M-mode): 1.6 cm LEFT ATRIUM             Index LA Vol (A2C):   19.9 ml 10.45 ml/m LA Vol (A4C):   19.6 ml 10.29 ml/m LA Biplane Vol: 20.4 ml 10.71 ml/m  AORTIC VALVE LVOT Vmax:   80.80 cm/s LVOT Vmean:  57.000 cm/s LVOT VTI:    0.170 m  AORTA Ao Root diam: 3.50 cm MITRAL VALVE MV Area (PHT): 3.63 cm    SHUNTS MV Decel Time: 209 msec    Systemic VTI:  0.17 m MV E velocity: 66.40 cm/s  Systemic Diam: 2.10 cm MV A velocity: 93.00 cm/s MV E/A ratio:  0.71 Mertie Moores MD Electronically signed by Mertie Moores MD Signature Date/Time: 09/21/2021/2:08:38 PM    Final     Impression and Plan Abnormal CT AB and Pelvis without contrast  - focal thickening about 3 cm at gastric fundus compared to 08/09/2021 that did not demonstrate any gastric thickening. -no UGI  symptoms at this time. -With comparison CT 08/09/2021 without any gastric thickening most likely this can be artifact from CT without contrast versus inflammation however patient's not having symptoms at this time, less likely gastric mass however we will schedule for endoscopy to evaluate. -I thoroughly discussed the procedure with the patient (at bedside) to include nature of the procedure, alternatives, benefits, and risks (including but not limited to bleeding, infection, perforation, anesthesia/cardiac pulmonary complications).  Patient verbalized understanding and gave verbal consent to proceed with EGD - Full liquids today -NPO at midnight - Hold heparin day of the procedure.   Bladder cancer s/p TURP, BCG therapy Anemia of chronic disease Urosepsis DKA Type 2 Diabetes   LOS: 3 days   Vladimir Crofts  PA-C 09/23/2021, 9:59 AM  Contact #  [phone]

## 2021-09-23 NOTE — Progress Notes (Signed)
PROGRESS NOTE    Adam Chung  GDJ:[id number] DOB: Mar 30, 1951 DOA: 09/20/2021 PCP: Adam Aus, MD   Brief Narrative/Hospital Course: Adam Chung, 70 y.o. male with PMH of CKD3a, DM, bladder cancer on BCG injection complicated by developing a bladder infection secondary to BCG started on RIPE therapy by ID in [place] brought to the ED for evaluation of confusion.  Family reports patient has not been compliant with his medication and has been having nausea and confusion and attributing this to continue treatment RIP.  Family was concerned that he was not answering his phone, EMS was contacted and found him on the ground on his knees complaining of pain in his gluteal area blood glucose in the field was more than 500. Seen in the ED found to be in DKA due to 6222, acute metabolic encephalopathy concerning for UTI and admitted.Anion gap has closed, DKA resolved patient transition to subcu insulin.  Normally he is on only oral medication at home.  Subjective: Seen this morning daughter and son-in-law at the bedside.  Patient reports he feels better today he feels hungry and was able to tolerate p.o. No other new complaints.  Assessment & Plan: DKA: Resolved, transitioned to subcu insulin. Type 2 diabetes mellitus with uncontrolled hyperglycemia: Managed with po OHA at home.  Update HbA1c.  Blood sugar improving continue Lantus 30 units and SSI.  Will likely need to go home on long-term insulin patient educated. Recent Labs  Lab 09/22/21 1151 09/22/21 1617 09/22/21 2125 09/23/21 0731 09/23/21 1118  GLUCAP 242* 201* 146* 158* 186*   Gastric fundus with 3 cm mass versus area of focal thickening GI consulted, given patient's recent weight loss of 4.9 kg/month Significant weight loss recently with decreased appetite about 9 kg weight loss and past 1 month.  Await GI input . RD consulted  Acute metabolic encephalopathy due to metabolic derangement sepsis DKA: Resolved  Sepsis  POA due to UTI.  Urine culture less than 10,000 colonies.  Treated with aztreonam.  Now on RIPE  Therapy.  Leukocytosis has resolved.  He is afebrile Pro-Cal was 1.2 on admission Recent Bladder infection secondary to BCG-on RIPE therapy by ID in [place]-ID was discussed by Dr. Avon Chung and patient is back on ripe therapy.  Tolerating.  Follow-up with ID in [place] hypertension: BP stable.  Not on meds at home  AKI on CKD stage IV: Baseline creat~ 2.0 in 08/11/21 w/ gfr 34,suspect due to DKA volume depletion prerenal.  Peaked to 5.5, now downtrending continue gentle IV fluids.  Encourage oral intake Recent Labs  Lab 09/20/21 2116 09/21/21 0122 09/21/21 0429 09/22/21 0229 09/23/21 0242  BUN 146* 143* 144* 122* 101*  CREATININE 3.98* 3.44* 3.64* 3.12* 2.90*    Normocytic anemia/anemia of chronic disease: Hemoglobin is stable. Recent Labs  Lab 09/20/21 1019 09/20/21 1047 09/22/21 0229 09/23/21 0242  HGB 11.5* 11.9* 10.2* 10.0*  HCT 34.8* 35.0* 30.3* 30.2*    Hyperkalemia-resolved Thrombocytosis due to hemoconcentration on admission.  Resolved Recent Labs  Lab 09/20/21 1019 09/22/21 0229 09/23/21 0242  PLT 464* 234 211    Trigeminy: Continue metoprolol, rate is stable echo was done that showed no wall motion normalities EF 60 to 65% Hyperphosphatemia/Hypermagnesemia: On admission likely hemoconcentration-repeat in a.m.  Pressure injury of skin in the coccyx and left knee POA see below Pressure Injury 09/20/21 Coccyx Medial Stage 1 -  Intact skin with non-blanchable redness of a localized area usually over a bony prominence. (Active)  09/20/21 1530  Location: Coccyx  Location Orientation: Medial  Staging: Stage 1 -  Intact skin with non-blanchable redness of a localized area usually over a bony prominence.  Wound Description (Comments):   Present on Admission: Yes     Pressure Injury 09/20/21 Knee Anterior;Left Stage 1 -  Intact skin with non-blanchable  redness of a localized area usually over a bony prominence. (Active)  09/20/21 1530  Location: Knee  Location Orientation: Anterior;Left  Staging: Stage 1 -  Intact skin with non-blanchable redness of a localized area usually over a bony prominence.  Wound Description (Comments):   Present on Admission: Yes    1. Diffuse mild wall thickening of the urinary bladder with mild adjacent fat stranding, suggestive of recurrent cystitis. 2. Unchanged bilateral severe hydroureteronephrosis. No nephrolithiasis or perinephric fat stranding. 3. In the gastric fundus, there is a 3 cm mass versus area of focal thickening. Recommend further evaluation with EGD for direct visualization. 4. Hard formed stools in the transverse and descending colon with small bowel feces sign in the distal ileum, indicative of slow transit. No bowel obstruction   DVT prophylaxis: heparin injection 5,000 Units Start: 09/20/21 2200 Code Status:   Code Status: Full Code Family Communication: plan of care discussed with patient and his daughter at bedside. Status is: Inpatient Remains inpatient appropriate because: For ongoing management of uncontrolled diabetes, GI evaluation.  Can downgrade to MedSurg unit today. PT OT requested   Objective: Vitals last 24 hrs: Vitals:   09/23/21 0400 09/23/21 0500 09/23/21 0700 09/23/21 0800  BP: 133/67 (!) 143/73  130/63  Pulse: 92 95    Resp: 11 (!) 9 10 10   Temp: 98.8 F (37.1 C)  98.3 F (36.8 C)   TempSrc: Axillary  Oral   SpO2: 99% 99%    Weight:      Height:       Weight change:   Intake/Output Summary (Last 24 hours) at 09/23/2021 0932 Last data filed at 09/23/2021 0529 Gross per 24 hour  Intake --  Output 1950 ml  Net -1950 ml   Net IO Since Admission: -1,808.24 mL [09/23/21 0932]   Physical Examination: General exam: AA0x3, pleasant, weak,older than stated age. HEENT:Oral mucosa moist, Ear/Nose WNL grossly,dentition normal. Respiratory system: B/l  clear BS, no use of accessory muscle, non tender. Cardiovascular system: S1 & S2 +,No JVD. Gastrointestinal system: Abdomen soft, NT,ND, BS+. Nervous System:Alert, awake, moving extremities. Extremities: edema none, distal peripheral pulses palpable.  Skin: No rashes, no icterus. MSK: Normal muscle bulk, tone, power.  Medications reviewed:  Scheduled Meds:  Chlorhexidine Gluconate Cloth  6 each Topical Q0600   ethambutol  1,200 mg Oral Daily   heparin injection (subcutaneous)  5,000 Units Subcutaneous Q8H   insulin aspart  0-15 Units Subcutaneous TID WC   insulin aspart  0-5 Units Subcutaneous QHS   insulin glargine-yfgn  30 Units Subcutaneous Daily   isoniazid  300 mg Oral Daily   mouth rinse  15 mL Mouth Rinse BID   pyridOXINE  50 mg Oral Daily   rifampin  600 mg Oral Daily   tamsulosin  0.4 mg Oral Daily   Continuous Infusions:  dextrose 5% lactated ringers Stopped (09/21/21 1239)   lactated ringers 125 mL/hr at 09/20/21 1730    Diet Order             Diet Carb Modified Fluid consistency: Thin; Room service appropriate? Yes  Diet effective now  Weight change:   Wt Readings from Last 3 Encounters:  09/20/21 71.4 kg  08/26/21 80.3 kg  08/20/21 80.3 kg     Consultants:see note  Procedures:see note Antimicrobials: Anti-infectives (From admission, onward)    Start     Dose/Rate Route Frequency Ordered Stop   09/21/21 1000  rifampin (RIFADIN) capsule 600 mg        600 mg Oral Daily 09/21/21 0703     09/21/21 1000  isoniazid (NYDRAZID) tablet 300 mg        300 mg Oral Daily 09/21/21 0703     09/21/21 1000  ethambutol (MYAMBUTOL) tablet 1,200 mg        1,200 mg Oral Daily 09/21/21 0703     09/20/21 2000  aztreonam (AZACTAM) 1 g in sodium chloride 0.9 % 100 mL IVPB  Status:  Discontinued        1 g 200 mL/hr over 30 Minutes Intravenous Every 8 hours 09/20/21 1921 09/21/21 1546      Culture/Microbiology    Component Value Date/Time   SDES   09/20/2021 1035    URINE, CLEAN CATCH Performed at [facility], [place] [address], [place], [place] [address]    SPECREQUEST  09/20/2021 1035    NONE Performed at [facility], [place] [address], [place], [place] [address]    CULT (A) 09/20/2021 1035    <10,000 COLONIES/mL INSIGNIFICANT GROWTH Performed at [facility] [address], [place], [place] [address]    REPTSTATUS 09/21/2021 FINAL 09/20/2021 1035    Other culture-see note  Unresulted Labs (From admission, onward)     Start     Ordered   09/27/21 0500  Creatinine, serum  (enoxaparin (LOVENOX)    CrCl >/= 30 ml/min)  Weekly,   R     Comments: while on enoxaparin therapy    09/20/21 1538   09/22/21 0500  CBC  Daily,   R     Question:  Specimen collection method  Answer:  Lab=Lab collect   09/21/21 1406   09/22/21 8315  Basic metabolic panel  Daily,   R     Question:  Specimen collection method  Answer:  Lab=Lab collect   09/21/21 1406          Data Reviewed: I have personally reviewed following labs and imaging studies CBC: Recent Labs  Lab 09/20/21 1019 09/20/21 1047 09/22/21 0229 09/23/21 0242  WBC 21.2*  --  13.8* 7.7  NEUTROABS 19.0*  --   --   --   HGB 11.5* 11.9* 10.2* 10.0*  HCT 34.8* 35.0* 30.3* 30.2*  MCV 87.7  --  86.1 87.5  PLT 464*  --  234 176   Basic Metabolic Panel: Recent Labs  Lab 09/20/21 1412 09/20/21 1413 09/20/21 2116 09/21/21 0122 09/21/21 0429 09/22/21 0229 09/23/21 0242  NA  --    < > 134* 133* 134* 134* 135  K  --    < > 4.8 4.9 5.0 5.0 4.8  CL  --    < > 102 104 106 107 108  CO2  --    < > 17* 19* 16* 18* 17*  GLUCOSE  --    < > 245* 125* 148* 225* 144*  BUN  --    < > 146* 143* 144* 122* 101*  CREATININE  --    < > 3.98* 3.44* 3.64* 3.12* 2.90*  CALCIUM  --    < > 9.4 9.1 8.8* 8.9 8.9  MG  3.2*  --   --   --   --   --   --   PHOS 6.5*  --   --   --   --   --   --    < > = values in this interval not displayed.    GFR: Estimated Creatinine Clearance: 23.9 mL/min (A) (by C-G formula based on SCr of 2.9 mg/dL (H)). Liver Function Tests: No results for input(s): AST, ALT, ALKPHOS, BILITOT, PROT, ALBUMIN in the last 168 hours. No results for input(s): LIPASE, AMYLASE in the last 168 hours. No results for input(s): AMMONIA in the last 168 hours. Coagulation Profile: No results for input(s): INR, PROTIME in the last 168 hours. Cardiac Enzymes: No results for input(s): CKTOTAL, CKMB, CKMBINDEX, TROPONINI in the last 168 hours. BNP (last 3 results) No results for input(s): PROBNP in the last 8760 hours. HbA1C: No results for input(s): HGBA1C in the last 72 hours. CBG: Recent Labs  Lab 09/22/21 0741 09/22/21 1151 09/22/21 1617 09/22/21 2125 09/23/21 0731  GLUCAP 233* 242* 201* 146* 158*   Lipid Profile: No results for input(s): CHOL, HDL, LDLCALC, TRIG, CHOLHDL, LDLDIRECT in the last 72 hours. Thyroid Function Tests: No results for input(s): TSH, T4TOTAL, FREET4, T3FREE, THYROIDAB in the last 72 hours. Anemia Panel: No results for input(s): VITAMINB12, FOLATE, FERRITIN, TIBC, IRON, RETICCTPCT in the last 72 hours. Sepsis Labs: Recent Labs  Lab 09/20/21 1813  PROCALCITON 1.20    Recent Results (from the past 240 hour(s))  Urine Culture     Status: Abnormal   Collection Time: 09/20/21 10:35 AM   Specimen: Urine, Clean Catch  Result Value Ref Range Status   Specimen Description   Final    URINE, CLEAN CATCH Performed at [facility], [place] [address] [place], [place] [address]    Special Requests   Final    NONE Performed at [facility], [place] [address] [place], [place] [address]    Culture (A)  Final    <10,000 COLONIES/mL INSIGNIFICANT GROWTH Performed at [facility] [address], [place], [place] [address]    Report Status 09/21/2021 FINAL  Final  MRSA Next Gen by PCR, Nasal     Status: None   Collection Time: 09/20/21  3:40 PM    Specimen: Nasal Mucosa; Nasal Swab  Result Value Ref Range Status   MRSA by PCR Next Gen NOT DETECTED NOT DETECTED Final    Comment: (NOTE) The GeneXpert MRSA Assay (FDA approved for NASAL specimens only), is one component of a comprehensive MRSA colonization surveillance program. It is not intended to diagnose MRSA infection nor to guide or monitor treatment for MRSA infections. Test performance is not FDA approved in patients less than 96 years old. Performed at [facility], [place] [address] [place], [place] [address]   Resp Panel by RT-PCR (Flu A&B, Covid) Nasal Mucosa     Status: None   Collection Time: 09/20/21  3:41 PM   Specimen: Nasal Mucosa; Nasopharyngeal(NP) swabs in vial transport medium  Result Value Ref Range Status   SARS Coronavirus 2 by RT PCR NEGATIVE NEGATIVE Final    Comment: (NOTE) SARS-CoV-2 target nucleic acids are NOT DETECTED.  The SARS-CoV-2 RNA is generally detectable in upper respiratory specimens during the acute phase of infection. The lowest concentration of SARS-CoV-2 viral copies this assay can detect is 138 copies/mL. A negative result does not preclude SARS-Cov-2 infection and should not be used as the  sole basis for treatment or other patient management decisions. A negative result may occur with  improper specimen collection/handling, submission of specimen other than nasopharyngeal swab, presence of viral mutation(s) within the areas targeted by this assay, and inadequate number of viral copies(<138 copies/mL). A negative result must be combined with clinical observations, patient history, and epidemiological information. The expected result is Negative.  Fact Sheet for Patients:  EntrepreneurPulse.com.au  Fact Sheet for Healthcare Providers:  IncredibleEmployment.be  This test is no t yet approved or cleared by the [place] FDA and  has been authorized for detection and/or  diagnosis of SARS-CoV-2 by FDA under an Emergency Use Authorization (EUA). This EUA will remain  in effect (meaning this test can be used) for the duration of the COVID-19 declaration under Section 564(b)(1) of the Act, 21 U.S.C.section 360bbb-3(b)(1), unless the authorization is terminated  or revoked sooner.       Influenza A by PCR NEGATIVE NEGATIVE Final   Influenza B by PCR NEGATIVE NEGATIVE Final    Comment: (NOTE) The Xpert Xpress SARS-CoV-2/FLU/RSV plus assay is intended as an aid in the diagnosis of influenza from Nasopharyngeal swab specimens and should not be used as a sole basis for treatment. Nasal washings and aspirates are unacceptable for Xpert Xpress SARS-CoV-2/FLU/RSV testing.  Fact Sheet for Patients: EntrepreneurPulse.com.au  Fact Sheet for Healthcare Providers: IncredibleEmployment.be  This test is not yet approved or cleared by the [place] FDA and has been authorized for detection and/or diagnosis of SARS-CoV-2 by FDA under an Emergency Use Authorization (EUA). This EUA will remain in effect (meaning this test can be used) for the duration of the COVID-19 declaration under Section 564(b)(1) of the Act, 21 U.S.C. section 360bbb-3(b)(1), unless the authorization is terminated or revoked.  Performed at [facility], [place] [address] [place], [place] [address]      Radiology Studies: ECHOCARDIOGRAM COMPLETE  Result Date: 09/21/2021    ECHOCARDIOGRAM REPORT   Patient Name:   Adam Chung Date of Exam: 09/21/2021 Medical Rec #:  [id number]         Height:       71.0 in Accession #:    [id number]        Weight:       157.4 lb Date of Birth:  10-Sep-1951         BSA:          1.905 m Patient Age:    79 years          BP:           119/67 mmHg Patient Gender: M                 HR:           96 bpm. Exam Location:  Inpatient Procedure: 2D Echo, Cardiac Doppler, Color Doppler and Intracardiac             Opacification Agent Indications:    Trigeminy [[id number]]  History:        Patient has no prior history of Echocardiogram examinations.                 Risk Factors:Diabetes. Cancer.  Sonographer:    Adam Sicilian RDCS Referring Phys: [id number] DAVID MANUEL [place]  Sonographer Comments: Suboptimal parasternal window and suboptimal apical window. IMPRESSIONS  1. Technically difficult echo with poor image quality.  2. Left ventricular ejection fraction, by estimation, is 60 to 65%. The left ventricle has normal function. The left ventricle  has no regional wall motion abnormalities. Left ventricular diastolic parameters were normal.  3. Right ventricular systolic function is mildly reduced. The right ventricular size is normal.  4. The mitral valve is grossly normal. No evidence of mitral valve regurgitation.  5. The aortic valve is normal in structure. Aortic valve regurgitation is not visualized. No aortic stenosis is present. FINDINGS  Left Ventricle: Left ventricular ejection fraction, by estimation, is 60 to 65%. The left ventricle has normal function. The left ventricle has no regional wall motion abnormalities. Definity contrast agent was given IV to delineate the left ventricular  endocardial borders. The left ventricular internal cavity size was normal in size. There is no left ventricular hypertrophy. Left ventricular diastolic parameters were normal. Right Ventricle: The right ventricular size is normal. Right vetricular wall thickness was not well visualized. Right ventricular systolic function is mildly reduced. Left Atrium: Left atrial size was normal in size. Right Atrium: Right atrial size was normal in size. Pericardium: There is no evidence of pericardial effusion. Mitral Valve: The mitral valve is grossly normal. No evidence of mitral valve regurgitation. Tricuspid Valve: The tricuspid valve is not well visualized. Tricuspid valve regurgitation is trivial. Aortic Valve: The aortic valve is normal in  structure. Aortic valve regurgitation is not visualized. No aortic stenosis is present. Pulmonic Valve: The pulmonic valve was not well visualized. Pulmonic valve regurgitation is not visualized. Aorta: The aortic root and ascending aorta are structurally normal, with no evidence of dilitation. IAS/Shunts: The atrial septum is grossly normal. Additional Comments: Technically difficult echo with poor image quality.  LEFT VENTRICLE PLAX 2D LVOT diam:     2.10 cm   Diastology LV SV:         59        LV e' medial:    5.19 cm/s LV SV Index:   31        LV E/e' medial:  12.8 LVOT Area:     3.46 cm  LV e' lateral:   7.51 cm/s                          LV E/e' lateral: 8.8  RIGHT VENTRICLE RV S prime:     11.40 cm/s TAPSE (M-mode): 1.6 cm LEFT ATRIUM             Index LA Vol (A2C):   19.9 ml 10.45 ml/m LA Vol (A4C):   19.6 ml 10.29 ml/m LA Biplane Vol: 20.4 ml 10.71 ml/m  AORTIC VALVE LVOT Vmax:   80.80 cm/s LVOT Vmean:  57.000 cm/s LVOT VTI:    0.170 m  AORTA Ao Root diam: 3.50 cm MITRAL VALVE MV Area (PHT): 3.63 cm    SHUNTS MV Decel Time: 209 msec    Systemic VTI:  0.17 m MV E velocity: 66.40 cm/s  Systemic Diam: 2.10 cm MV A velocity: 93.00 cm/s MV E/A ratio:  0.71 Mertie Moores MD Electronically signed by Mertie Moores MD Signature Date/Time: 09/21/2021/2:08:38 PM    Final      LOS: 3 days   Antonieta Pert, MD [facility]  09/23/2021, 9:32 AM

## 2021-09-24 ENCOUNTER — Ambulatory Visit: Payer: Medicare Other | Admitting: Infectious Diseases

## 2021-09-24 ENCOUNTER — Encounter (HOSPITAL_COMMUNITY)
Admission: EM | Disposition: A | Discharge status: Skilled Nursing Facility | Payer: Self-pay | Source: Home / Self Care | Attending: Internal Medicine

## 2021-09-24 ENCOUNTER — Inpatient Hospital Stay (HOSPITAL_COMMUNITY): Payer: Medicare Other | Admitting: Anesthesiology

## 2021-09-24 DIAGNOSIS — E875 Hyperkalemia: Secondary | ICD-10-CM | POA: Diagnosis not present

## 2021-09-24 DIAGNOSIS — R933 Abnormal findings on diagnostic imaging of other parts of digestive tract: Secondary | ICD-10-CM | POA: Diagnosis not present

## 2021-09-24 DIAGNOSIS — E1110 Type 2 diabetes mellitus with ketoacidosis without coma: Secondary | ICD-10-CM | POA: Diagnosis not present

## 2021-09-24 DIAGNOSIS — E440 Moderate protein-calorie malnutrition: Secondary | ICD-10-CM | POA: Insufficient documentation

## 2021-09-24 DIAGNOSIS — I10 Essential (primary) hypertension: Secondary | ICD-10-CM | POA: Diagnosis not present

## 2021-09-24 HISTORY — PX: ESOPHAGOGASTRODUODENOSCOPY (EGD) WITH PROPOFOL: SHX5813

## 2021-09-24 HISTORY — PX: BIOPSY: SHX5522

## 2021-09-24 LAB — BASIC METABOLIC PANEL
Anion gap: 9 (ref 5–15)
BUN: 85 mg/dL — ABNORMAL HIGH (ref 8–23)
CO2: 18 mmol/L — ABNORMAL LOW (ref 22–32)
Calcium: 9.2 mg/dL (ref 8.9–10.3)
Chloride: 111 mmol/L (ref 98–111)
Creatinine, Ser: 2.12 mg/dL — ABNORMAL HIGH (ref 0.61–1.24)
GFR, Estimated: 33 mL/min — ABNORMAL LOW (ref >60)
Glucose, Bld: 143 mg/dL — ABNORMAL HIGH (ref 70–99)
Potassium: 4.9 mmol/L (ref 3.5–5.1)
Sodium: 138 mmol/L (ref 135–145)

## 2021-09-24 LAB — CBC
HCT: 31.3 % — ABNORMAL LOW (ref 39.0–52.0)
Hemoglobin: 10.1 g/dL — ABNORMAL LOW (ref 13.0–17.0)
MCH: 28.9 pg (ref 26.0–34.0)
MCHC: 32.3 g/dL (ref 30.0–36.0)
MCV: 89.7 fL (ref 80.0–100.0)
Platelets: 197 10*3/uL (ref 150–400)
RBC: 3.49 MIL/uL — ABNORMAL LOW (ref 4.22–5.81)
RDW: 14.4 % (ref 11.5–15.5)
WBC: 7.4 10*3/uL (ref 4.0–10.5)
nRBC: 0 % (ref 0.0–0.2)

## 2021-09-24 LAB — GLUCOSE, CAPILLARY
Glucose-Capillary: 129 mg/dL — ABNORMAL HIGH (ref 70–99)
Glucose-Capillary: 115 mg/dL — ABNORMAL HIGH (ref 70–99)
Glucose-Capillary: 113 mg/dL — ABNORMAL HIGH (ref 70–99)
Glucose-Capillary: 129 mg/dL — ABNORMAL HIGH (ref 70–99)

## 2021-09-24 SURGERY — ESOPHAGOGASTRODUODENOSCOPY (EGD) WITH PROPOFOL
Anesthesia: Monitor Anesthesia Care

## 2021-09-24 MED ORDER — ENSURE ENLIVE PO LIQD
237.0000 mL | Freq: Two times a day (BID) | ORAL | Status: DC
  Administered 2021-09-25 (×2): 237 mL via ORAL

## 2021-09-24 MED ORDER — PHENYLEPHRINE 40 MCG/ML (10ML) SYRINGE FOR IV PUSH (FOR BLOOD PRESSURE SUPPORT)
PREFILLED_SYRINGE | INTRAVENOUS | Status: DC | PRN
  Administered 2021-09-24: 80 ug via INTRAVENOUS

## 2021-09-24 MED ORDER — LIDOCAINE 2% (20 MG/ML) 5 ML SYRINGE
INTRAMUSCULAR | Status: DC | PRN
  Administered 2021-09-24: 50 mg via INTRAVENOUS

## 2021-09-24 MED ORDER — PROPOFOL 500 MG/50ML IV EMUL
INTRAVENOUS | Status: DC | PRN
  Administered 2021-09-24: 125 ug/kg/min via INTRAVENOUS

## 2021-09-24 MED ORDER — SODIUM CHLORIDE 0.9 % IV SOLN: INTRAVENOUS | Status: DC

## 2021-09-24 MED ORDER — PROPOFOL 500 MG/50ML IV EMUL
INTRAVENOUS | Status: AC
  Filled 2021-09-24: qty 50

## 2021-09-24 MED ORDER — ONDANSETRON HCL 4 MG/2ML IJ SOLN
INTRAMUSCULAR | Status: DC | PRN
  Administered 2021-09-24: 4 mg via INTRAVENOUS

## 2021-09-24 MED ORDER — SODIUM BICARBONATE 650 MG PO TABS
650.0000 mg | ORAL_TABLET | Freq: Two times a day (BID) | ORAL | Status: DC
  Administered 2021-09-24 – 2021-09-26 (×4): 650 mg via ORAL
  Filled 2021-09-24 (×6): qty 1

## 2021-09-24 MED ORDER — PROPOFOL 10 MG/ML IV BOLUS
INTRAVENOUS | Status: DC | PRN
  Administered 2021-09-24: 20 mg via INTRAVENOUS

## 2021-09-24 SURGICAL SUPPLY — 14 items

## 2021-09-24 NOTE — NC FL2 (Signed)
[place] LEVEL OF CARE SCREENING TOOL     IDENTIFICATION  Patient Name: Adam Chung Birthdate: December 19, 1950 Sex: male Admission Date (Current Location): 09/20/2021  [facility] and [place] Number:  Herbalist and Address:  [facility],  [place], [place]      Provider Number: [id number]  Attending Physician Name and Address:  Eugenie Filler, MD  Relative Name and Phone Number:  daughter, Denny Levy [phone]    Current Level of Care: Hospital Recommended Level of Care: [place] Prior Approval Number:    Date Approved/Denied:   PASRR Number: [id number]  Discharge Plan: SNF    Current Diagnoses: Patient Active Problem List   Diagnosis Date Noted   DKA (diabetic ketoacidosis) ([place]) 09/20/2021   Uncontrolled type 2 diabetes mellitus with hyperglycemia ([place]) 09/20/2021   Normocytic anemia 09/20/2021   Hyperkalemia 09/20/2021   Thrombocytosis 09/20/2021   Trigeminy 09/20/2021   Hyperphosphatemia 09/20/2021   Hypermagnesemia 09/20/2021   Pressure injury of skin 09/20/2021   Acute renal failure ([place]) 08/08/2021   Abscess 08/07/2021   Essential hypertension 08/07/2021   Diabetes mellitus type 2, noninsulin dependent ([place]) 08/07/2021   CKD (chronic kidney disease) stage 3, GFR 30-59 ml/min (HCC) 08/07/2021   AKI (acute kidney injury) ([place]) 08/07/2021   Acute lower UTI 08/07/2021   Acute urinary retention 08/07/2021    Orientation RESPIRATION BLADDER Height & Weight     Self, Time, Situation, Place  Normal Continent, External catheter Weight: 157 lb 6.5 oz (71.4 kg) Height:  5\' 11"  (180.3 cm)  BEHAVIORAL SYMPTOMS/MOOD NEUROLOGICAL BOWEL NUTRITION STATUS      Continent    AMBULATORY STATUS COMMUNICATION OF NEEDS Skin   Limited Assist Verbally PU Stage and Appropriate Care PU Stage 1 Dressing: No Dressing                     Personal Care Assistance Level of Assistance  Bathing,  Dressing Bathing Assistance: Limited assistance   Dressing Assistance: Limited assistance     Functional Limitations Info             SPECIAL CARE FACTORS FREQUENCY  PT (By licensed PT), OT (By licensed OT)     PT Frequency: 5x/wk OT Frequency: 5x/wk            Contractures Contractures Info: Not present    Additional Factors Info  Code Status, Allergies Code Status Info: Full Allergies Info: Apple, Penicillins           Current Medications (09/24/2021):  This is the current hospital active medication list Current Facility-Administered Medications  Medication Dose Route Frequency Provider Last Rate Last Admin   0.9 %  sodium chloride infusion   Intravenous Continuous Eugenie Filler, MD 75 mL/hr at 09/24/21 0817 Rate Change at 09/24/21 0817   acetaminophen (TYLENOL) tablet 650 mg  650 mg Oral Q6H PRN Reubin Milan, MD       Or   acetaminophen (TYLENOL) suppository 650 mg  650 mg Rectal Q6H PRN Reubin Milan, MD       Chlorhexidine Gluconate Cloth 2 % PADS 6 each  6 each Topical Q0600 Reubin Milan, MD   6 each at 09/23/21 0449   dextrose 50 % solution 0-50 mL  0-50 mL Intravenous PRN Reubin Milan, MD       ethambutol (MYAMBUTOL) tablet 1,200 mg  1,200 mg Oral Daily Little Ishikawa, MD   1,200 mg at  09/23/21 1015   heparin injection 5,000 Units  5,000 Units Subcutaneous Q8H Green, Terri L, RPH       insulin aspart (novoLOG) injection 0-15 Units  0-15 Units Subcutaneous TID WC Little Ishikawa, MD   2 Units at 09/24/21 1224   insulin aspart (novoLOG) injection 0-5 Units  0-5 Units Subcutaneous QHS Little Ishikawa, MD       insulin glargine-yfgn [facility]) injection 30 Units  30 Units Subcutaneous Daily Little Ishikawa, MD   30 Units at 09/24/21 1126   isoniazid (NYDRAZID) tablet 300 mg  300 mg Oral Daily Little Ishikawa, MD   300 mg at 09/23/21 1015   MEDLINE mouth rinse  15 mL Mouth Rinse BID Reubin Milan, MD    15 mL at 09/24/21 1032   metoprolol tartrate (LOPRESSOR) injection 5 mg  5 mg Intravenous Q8H PRN Little Ishikawa, MD       ondansetron [facility]) tablet 4 mg  4 mg Oral Q6H PRN Reubin Milan, MD       Or   ondansetron [facility]) injection 4 mg  4 mg Intravenous Q6H PRN Reubin Milan, MD   4 mg at 09/21/21 1423   pyridOXINE (VITAMIN B-6) tablet 50 mg  50 mg Oral Daily Little Ishikawa, MD   50 mg at 09/23/21 1015   rifampin (RIFADIN) capsule 600 mg  600 mg Oral Daily Little Ishikawa, MD   600 mg at 09/23/21 1014   simethicone (MYLICON) chewable tablet 80 mg  80 mg Oral QID PRN Little Ishikawa, MD       sodium bicarbonate tablet 650 mg  650 mg Oral BID Eugenie Filler, MD       tamsulosin [facility]) capsule 0.4 mg  0.4 mg Oral Daily Little Ishikawa, MD   0.4 mg at 09/23/21 1012     Discharge Medications: Please see discharge summary for a list of discharge medications.  Relevant Imaging Results:  Relevant Lab Results:   Additional Information SS# [id number]; pt is UNVACCINATED for COVID  Orean Giarratano, LCSW

## 2021-09-24 NOTE — Anesthesia Procedure Notes (Signed)
Procedure Name: MAC Date/Time: 09/24/2021 2:06 PM Performed by: Maxwell Caul, CRNA Pre-anesthesia Checklist: Patient identified, Emergency Drugs available, Suction available and Patient being monitored Oxygen Delivery Method: Simple face mask

## 2021-09-24 NOTE — Progress Notes (Signed)
PROGRESS NOTE    Adam Chung  MIW:[id number] DOB: [birth year] DOA: 09/20/2021 PCP: Rusty Aus, MD   No chief complaint on file.   Brief Narrative:  Adam Chung, 70 y.o. male with PMH of CKD3a, DM, bladder cancer on BCG injection complicated by developing a bladder infection secondary to BCG started on RIPE therapy by ID in [place] brought to the ED for evaluation of confusion.  Family reports patient has not been compliant with his medication and has been having nausea and confusion and attributing this to continue treatment RIP.  Family was concerned that he was not answering his phone, EMS was contacted and found him on the ground on his knees complaining of pain in his gluteal area blood glucose in the field was more than 500. Seen in the ED found to be in DKA due to 2500, acute metabolic encephalopathy concerning for UTI and admitted.Anion gap has closed, DKA resolved patient transition to subcu insulin.  Normally he is on only oral medication at home.   Assessment & Plan:   Principal Problem:   DKA (diabetic ketoacidosis) ([place]) Active Problems:   Essential hypertension   AKI (acute kidney injury) ([place])   Uncontrolled type 2 diabetes mellitus with hyperglycemia (HCC)   Normocytic anemia   Hyperkalemia   Thrombocytosis   Trigeminy   Hyperphosphatemia   Hypermagnesemia   Pressure injury of skin  #1.DKA/type 2 diabetes mellitus, poorly controlled -Admitted with DKA with blood sugars of 3704, acute metabolic encephalopathy, concern for UTI.  Urinalysis done with glycosuria, ketonuria. -Hemoglobin A1c 12.5 (09/23/2021) -Patient initially placed on the glucose stabilizer and monitored, and placed in the stepdown unit. -Patient hydrated IV fluids. -Patient placed on antibiotics. -Anion gap closed, DKA resolved and patient transition to subcutaneous insulin. -Continue Semglee, SSI -Will benefit from long-term insulin on discharge. - diabetes coordinator  following.  2.  Gastric fundus with 3 cm mass versus area of focal thickening -Patient seen in consultation by GI and due to recent weight loss of 4.9 kg/month with recently decreased appetite and recent 9 kg weight loss over the past month GI consulted. -Patient currently n.p.o. for upper endoscopy today.  3.  Acute metabolic encephalopathy -Likely secondary to DKA, metabolic derangements, UTI. -Resolved.  4.  Sepsis due to UTI, POA -Urine cultures with < 10,0000 colonies however was on antibiotics prior to admission. -Patient treated with aztreonam. -Currently afebrile. -Leukocytosis trended down. -Now on RIPE -Outpatient follow-up with ID.  5.  Recent bladder infection secondary to BCG on ripe therapy per ID in [place] -Dr. Avon Gully discussed with ID and patient back on RIPE therapy which he is tolerating. -Outpatient follow-up with primary ID doc in [place] on discharge.  6.  Hypertension -Stable.  7.  Metabolic acidosis -Likely secondary to CKD. -Placed on bicarb tablets x3 days.  8.  Acute kidney injury on chronic kidney disease stage IV -Baseline creatinine approximately 2.0 08/11/2021 with GFR of 34. -Likely secondary to prerenal azotemia in the setting of volume depletion and DKA. -Creatinine peaked at 5.5 and downtrending with gentle IV fluids. -Creatinine currently at 2.12 this morning. -Gentle hydration. -Avoid nephrotoxic agents. -Outpatient follow-up with PCP.  9.  Anemia of chronic disease -H&H stable.  10.  Hyperkalemia -Resolved.  11.  Thrombocytosis -Secondary to hemoconcentration on admission, resolved.  12.  Trigeminy -2D echo stable with no wall motion abnormalities. -Continue metoprolol.  #13. hyperphosphatemia/hypermagnesemia -Likely due to hemoconcentration.  14.  Moderate protein calorie malnutrition -Nutritional supplementation.  15.  Pressure  injury of skin, and coccyx and left knee, POA Pressure Injury 09/20/21 Coccyx Medial  Stage 1 -  Intact skin with non-blanchable redness of a localized area usually over a bony prominence. (Active)  09/20/21 1530  Location: Coccyx  Location Orientation: Medial  Staging: Stage 1 -  Intact skin with non-blanchable redness of a localized area usually over a bony prominence.  Wound Description (Comments):   Present on Admission: Yes     Pressure Injury 09/20/21 Knee Anterior;Left Stage 1 -  Intact skin with non-blanchable redness of a localized area usually over a bony prominence. (Active)  09/20/21 1530  Location: Knee  Location Orientation: Anterior;Left  Staging: Stage 1 -  Intact skin with non-blanchable redness of a localized area usually over a bony prominence.  Wound Description (Comments):   Present on Admission: Yes         DVT prophylaxis: Heparin Code Status: Full Family Communication: Updated patient and daughter at bedside Disposition:   Status is: Inpatient  Remains inpatient appropriate because: Ongoing management with GI evaluation pending for EGD today, management of uncontrolled diabetes.       Consultants:  Gastroenterology: Dr. Michail Sermon 09/23/2021  Procedures:  Upper endoscopy pending 09/24/2021 CT abdomen and pelvis 09/20/2021 \\2D  echo 09/21/2021     Antimicrobials:  Ethambutol 09/21/2021>>>>> Isoniazid 09/21/2021>>>>> Rifampin 09/21/2021>>>> Aztreonam 09/20/2021>>>> 09/21/2021    Subjective: Patient laying in bed.  Denies any chest pain.  No shortness of breath.  No abdominal pain.  Patient hungry.  Patient awaiting on upper endoscopy.  Patient with complaints of generalized weakness.  Daughter at bedside.  Objective: Vitals:   09/24/21 0111 09/24/21 0508 09/24/21 0830 09/24/21 0902  BP: 131/78 (!) 147/77 125/76 130/75  Pulse: 94 100 93 93  Resp: 16 18 18 18   Temp: 99.1 F (37.3 C) 98.8 F (37.1 C) 98.3 F (36.8 C) 98.4 F (36.9 C)  TempSrc: Oral Oral Oral Oral  SpO2: 99% 100% 100% 99%  Weight:      Height:         Intake/Output Summary (Last 24 hours) at 09/24/2021 1004 Last data filed at 09/24/2021 0600 Gross per 24 hour  Intake 624.65 ml  Output 1250 ml  Net -625.35 ml   Filed Weights   09/20/21 1007 09/20/21 1700  Weight: 80 kg 71.4 kg    Examination:  General exam: Appears calm and comfortable.  Dry mucous membranes Respiratory system: Clear to auscultation. Respiratory effort normal. Cardiovascular system: S1 & S2 heard, RRR. No JVD, murmurs, rubs, gallops or clicks. No pedal edema. Gastrointestinal system: Abdomen is nondistended, soft and nontender. No organomegaly or masses felt. Normal bowel sounds heard. Central nervous system: Alert and oriented. No focal neurological deficits. Extremities: Symmetric 5 x 5 power. Skin: No rashes, lesions or ulcers Psychiatry: Judgement and insight appear fair. Mood & affect appropriate.     Data Reviewed: I have personally reviewed following labs and imaging studies  CBC: Recent Labs  Lab 09/20/21 1019 09/20/21 1047 09/22/21 0229 09/23/21 0242 09/24/21 0418  WBC 21.2*  --  13.8* 7.7 7.4  NEUTROABS 19.0*  --   --   --   --   HGB 11.5* 11.9* 10.2* 10.0* 10.1*  HCT 34.8* 35.0* 30.3* 30.2* 31.3*  MCV 87.7  --  86.1 87.5 89.7  PLT 464*  --  234 211 161    Basic Metabolic Panel: Recent Labs  Lab 09/20/21 1412 09/20/21 1413 09/21/21 0122 09/21/21 0429 09/22/21 0229 09/23/21 0242 09/24/21 0418  NA  --    < >  133* 134* 134* 135 138  K  --    < > 4.9 5.0 5.0 4.8 4.9  CL  --    < > 104 106 107 108 111  CO2  --    < > 19* 16* 18* 17* 18*  GLUCOSE  --    < > 125* 148* 225* 144* 143*  BUN  --    < > 143* 144* 122* 101* 85*  CREATININE  --    < > 3.44* 3.64* 3.12* 2.90* 2.12*  CALCIUM  --    < > 9.1 8.8* 8.9 8.9 9.2  MG 3.2*  --   --   --   --   --   --   PHOS 6.5*  --   --   --   --   --   --    < > = values in this interval not displayed.    GFR: Estimated Creatinine Clearance: 32.7 mL/min (A) (by C-G formula based on SCr  of 2.12 mg/dL (H)).  Liver Function Tests: No results for input(s): AST, ALT, ALKPHOS, BILITOT, PROT, ALBUMIN in the last 168 hours.  CBG: Recent Labs  Lab 09/22/21 2125 09/23/21 0731 09/23/21 1118 09/23/21 1656 09/23/21 2121  GLUCAP 146* 158* 186* 207* 160*     Recent Results (from the past 240 hour(s))  Urine Culture     Status: Abnormal   Collection Time: 09/20/21 10:35 AM   Specimen: Urine, Clean Catch  Result Value Ref Range Status   Specimen Description   Final    URINE, CLEAN CATCH Performed at [facility], [place] [address], [place], [place] [address]    Special Requests   Final    NONE Performed at [facility], [place] [address], [place], [place] [address]    Culture (A)  Final    <10,000 COLONIES/mL INSIGNIFICANT GROWTH Performed at [facility] [address] [place], [place] [address]    Report Status 09/21/2021 FINAL  Final  MRSA Next Gen by PCR, Nasal     Status: None   Collection Time: 09/20/21  3:40 PM   Specimen: Nasal Mucosa; Nasal Swab  Result Value Ref Range Status   MRSA by PCR Next Gen NOT DETECTED NOT DETECTED Final    Comment: (NOTE) The GeneXpert MRSA Assay (FDA approved for NASAL specimens only), is one component of a comprehensive MRSA colonization surveillance program. It is not intended to diagnose MRSA infection nor to guide or monitor treatment for MRSA infections. Test performance is not FDA approved in patients less than 64 years old. Performed at [facility], [place] [address] [place],  [address]   Resp Panel by RT-PCR (Flu A&B, Covid) Nasal Mucosa     Status: None   Collection Time: 09/20/21  3:41 PM   Specimen: Nasal Mucosa; Nasopharyngeal(NP) swabs in vial transport medium  Result Value Ref Range Status   SARS Coronavirus 2 by RT PCR NEGATIVE NEGATIVE Final    Comment: (NOTE) SARS-CoV-2 target nucleic acids are NOT DETECTED.  The SARS-CoV-2 RNA is generally  detectable in upper respiratory specimens during the acute phase of infection. The lowest concentration of SARS-CoV-2 viral copies this assay can detect is 138 copies/mL. A negative result does not preclude SARS-Cov-2 infection and should not be used as the sole basis for treatment or other patient management decisions. A negative result may occur with  improper specimen collection/handling, submission of specimen other than nasopharyngeal swab, presence of viral  mutation(s) within the areas targeted by this assay, and inadequate number of viral copies(<138 copies/mL). A negative result must be combined with clinical observations, patient history, and epidemiological information. The expected result is Negative.  Fact Sheet for Patients:  EntrepreneurPulse.com.au  Fact Sheet for Healthcare Providers:  IncredibleEmployment.be  This test is no t yet approved or cleared by the [place] FDA and  has been authorized for detection and/or diagnosis of SARS-CoV-2 by FDA under an Emergency Use Authorization (EUA). This EUA will remain  in effect (meaning this test can be used) for the duration of the COVID-19 declaration under Section 564(b)(1) of the Act, 21 U.S.C.section 360bbb-3(b)(1), unless the authorization is terminated  or revoked sooner.       Influenza A by PCR NEGATIVE NEGATIVE Final   Influenza B by PCR NEGATIVE NEGATIVE Final    Comment: (NOTE) The Xpert Xpress SARS-CoV-2/FLU/RSV plus assay is intended as an aid in the diagnosis of influenza from Nasopharyngeal swab specimens and should not be used as a sole basis for treatment. Nasal washings and aspirates are unacceptable for Xpert Xpress SARS-CoV-2/FLU/RSV testing.  Fact Sheet for Patients: EntrepreneurPulse.com.au  Fact Sheet for Healthcare Providers: IncredibleEmployment.be  This test is not yet approved or cleared by the [place] FDA  and has been authorized for detection and/or diagnosis of SARS-CoV-2 by FDA under an Emergency Use Authorization (EUA). This EUA will remain in effect (meaning this test can be used) for the duration of the COVID-19 declaration under Section 564(b)(1) of the Act, 21 U.S.C. section 360bbb-3(b)(1), unless the authorization is terminated or revoked.  Performed at [facility], [place] [address], [place], [place] [address]          Radiology Studies: No results found.      Scheduled Meds:  Chlorhexidine Gluconate Cloth  6 each Topical Q0600   ethambutol  1,200 mg Oral Daily   heparin injection (subcutaneous)  5,000 Units Subcutaneous Q8H   insulin aspart  0-15 Units Subcutaneous TID WC   insulin aspart  0-5 Units Subcutaneous QHS   insulin glargine-yfgn  30 Units Subcutaneous Daily   isoniazid  300 mg Oral Daily   mouth rinse  15 mL Mouth Rinse BID   pyridOXINE  50 mg Oral Daily   rifampin  600 mg Oral Daily   sodium bicarbonate  650 mg Oral BID   tamsulosin  0.4 mg Oral Daily   Continuous Infusions:  sodium chloride 75 mL/hr at 09/24/21 0817     LOS: 4 days    Time spent: 40 minutes    Irine Seal, MD [facility]   To contact the attending provider between 7A-7P or the covering provider during after hours 7P-7A, please log into the web site [email or web address] and access using universal [place] password for that web site. If you do not have the password, please call the hospital operator.  09/24/2021, 10:04 AM

## 2021-09-24 NOTE — Evaluation (Signed)
Occupational Therapy Evaluation Patient Details Name: Adam Chung MRN: [id number] DOB: [birth year] Today's Date: 09/24/2021   History of Present Illness Adam Chung, 70 y.o. male with PMH of CKD3a, DM, bladder cancer,a bladder infection ,brought to the ED for evaluation of confusion, metabolic encephalopathy, DKA.   Clinical Impression   PTA, pt was living alone and independent in ADLs and IADLs up until weeks leading up to admission needing assist with IADLs due to reported weakness. Upon evaluation, pt with decreased activity tolerance and balance limiting functional abilities. Pt currently set up for UB ADLs, Min A for LB ADLs, and Min A for stand pivots with BUE assist. Educated pt on using RW for functional mobility, pt verbalized understanding of education. Patient will benefit from skilled OT services while in hospital to improve deficits and learn compensatory strategies as needed in order to return to PLOF. Due to current level of function and decreased caregiver support at home, recommending SNF rehab post-d/c.       Recommendations for follow up therapy are one component of a multi-disciplinary discharge planning process, led by the attending physician.  Recommendations may be updated based on patient status, additional functional criteria and insurance authorization.   Follow Up Recommendations  Skilled nursing-short term rehab (<3 hours/day)    Assistance Recommended at Discharge    Functional Status Assessment  Patient has had a recent decline in their functional status and demonstrates the ability to make significant improvements in function in a reasonable and predictable amount of time.  Equipment Recommendations  Other (comment) (defer to next venue)    Recommendations for Other Services       Precautions / Restrictions Precautions Precautions: Fall Restrictions Weight Bearing Restrictions: No      Mobility Bed Mobility Overal bed mobility: Modified  Independent Bed Mobility: Supine to Sit           General bed mobility comments: increased time and HOB slightly raised    Transfers Overall transfer level: Needs assistance   Transfers: Stand Pivot Transfers Sit to Stand: Min assist           General transfer comment: Educated pt to use RW in future due to need for BUE support with transfer.      Balance Overall balance assessment: Needs assistance Sitting-balance support: No upper extremity supported;Feet supported Sitting balance-Leahy Scale: Good     Standing balance support: Bilateral upper extremity supported Standing balance-Leahy Scale: Fair                             ADL either performed or assessed with clinical judgement   ADL Overall ADL's : Needs assistance/impaired Eating/Feeding: Modified independent   Grooming: Sitting;Set up   Upper Body Bathing: Sitting;Set up   Lower Body Bathing: Sit to/from stand;Minimal assistance   Upper Body Dressing : Set up;Sitting   Lower Body Dressing: Sit to/from stand;Minimal assistance       Toileting- Clothing Manipulation and Hygiene: Minimal assistance;Sit to/from stand       Functional mobility during ADLs: Minimal assistance       Vision Patient Visual Report: No change from baseline       Perception     Praxis      Pertinent Vitals/Pain Pain Assessment: No/denies pain     Hand Dominance     Extremity/Trunk Assessment Upper Extremity Assessment Upper Extremity Assessment: Overall WFL for tasks assessed (MMT BUEs 5/5 grossly)   Lower Extremity Assessment  Lower Extremity Assessment: Defer to PT evaluation   Cervical / Trunk Assessment Cervical / Trunk Assessment: Normal   Communication Communication Communication: No difficulties   Cognition Arousal/Alertness: Awake/alert Behavior During Therapy: WFL for tasks assessed/performed Overall Cognitive Status: Within Functional Limits for tasks assessed                                  General Comments: Pt A&O x4     General Comments       Exercises     Shoulder Instructions      Home Living Family/patient expects to be discharged to:: Private residence Living Arrangements: Alone Available Help at Discharge: Available PRN/intermittently;Family Type of Home: Mobile home Home Access: Stairs to enter Entrance Stairs-Number of Steps: 1   Home Layout: One level     Bathroom Shower/Tub: Tub/shower unit         Home Equipment: None          Prior Functioning/Environment Prior Level of Function : Other (comment);Needs assist             Mobility Comments: functional ambulation without use of device ADLs Comments: reports independence in ADLs/IADLs, daughters report inconsistent,  reports past few weeks they have brough him meals.        OT Problem List: Decreased strength;Impaired balance (sitting and/or standing);Decreased safety awareness;Decreased knowledge of use of DME or AE;Decreased knowledge of precautions      OT Treatment/Interventions: Self-care/ADL training;Therapeutic exercise;DME and/or AE instruction;Therapeutic activities;Patient/family education;Balance training    OT Goals(Current goals can be found in the care plan section) Acute Rehab OT Goals Patient Stated Goal: to increase independence OT Goal Formulation: With patient Time For Goal Achievement: 10/08/21 Potential to Achieve Goals: Good  OT Frequency: Min 2X/week   Barriers to D/C:            Co-evaluation              AM-PAC OT "6 Clicks" Daily Activity     Outcome Measure Help from another person eating meals?: None Help from another person taking care of personal grooming?: A Little Help from another person toileting, which includes using toliet, bedpan, or urinal?: A Little Help from another person bathing (including washing, rinsing, drying)?: A Lot Help from another person to put on and taking off regular upper body  clothing?: A Little Help from another person to put on and taking off regular lower body clothing?: A Little 6 Click Score: 18   End of Session Equipment Utilized During Treatment: Gait belt Nurse Communication: Mobility status  Activity Tolerance: Patient tolerated treatment well Patient left: in chair;with call bell/phone within reach;with chair alarm set  OT Visit Diagnosis: Unsteadiness on feet (R26.81);Muscle weakness (generalized) (M62.81)                Time: 1062-6948 OT Time Calculation (min): 21 min Charges:  OT General Charges $OT Visit: 1 Visit OT Evaluation $OT Eval Low Complexity: [place], OTS Acute Rehab Office: [phone]   Ellie Bryand 09/24/2021, 12:05 PM

## 2021-09-24 NOTE — Brief Op Note (Signed)
Mild proximal diffuse gastric wall thickening with a mosaic pattern. No mass seen. Biopsies taken and suspect they will be benign. Soft diet. F/U on path as outpt. He had a bloody stool prior to coming to endoscopy per nursing and suspect that is due to a rectal outlet source and he reports a history of an anal fissure. If bleeding recurs, then may need and updated colonoscopy as an inpt otherwise will do it as an outpt in the near future. Reports personal history of colon polyps last done approximately 5 years ago (records not available). Will f/u.

## 2021-09-24 NOTE — Op Note (Signed)
[facility] Patient Name: Adam Chung Procedure Date: 09/24/2021 MRN: [id number] Attending MD: Lear Ng , MD Date of Birth: Apr 07, 1951 CSN: [id number] Age: 70 Admit Type: Inpatient Procedure:                Upper GI endoscopy Indications:              Abnormal CT of the GI tract Providers:                Lear Ng, MD, Mariana Arn, Hinton Dyer Referring MD:             hospital team Medicines:                Propofol per Anesthesia, Monitored Anesthesia Care Complications:            No immediate complications. Estimated Blood Loss:     Estimated blood loss was minimal. Procedure:                Pre-Anesthesia Assessment:                           - Prior to the procedure, a History and Physical                            was performed, and patient medications and                            allergies were reviewed. The patient's tolerance of                            previous anesthesia was also reviewed. The risks                            and benefits of the procedure and the sedation                            options and risks were discussed with the patient.                            All questions were answered, and informed consent                            was obtained. Prior Anticoagulants: The patient has                            taken no previous anticoagulant or antiplatelet                            agents. ASA Grade Assessment: III - A patient with                            severe systemic disease. After reviewing the risks                            and benefits, the patient was deemed in  satisfactory condition to undergo the procedure.                           After obtaining informed consent, the endoscope was                            passed under direct vision. Throughout the                            procedure, the patient's blood pressure, pulse, and                            oxygen  saturations were monitored continuously. The                            [id number]-H190 ([id number] Olympus endoscope was introduced                            through the mouth, and advanced to the second part                            of duodenum. The upper GI endoscopy was                            accomplished without difficulty. The patient                            tolerated the procedure well. Scope In: Scope Out: Findings:      The Z-line was regular and was found 40 cm from the incisors.      There is no endoscopic evidence of mass in the entire examined stomach.      Segmental mild mucosal changes characterized by congestion and erythema       were found in the gastric fundus and in the gastric body. Biopsies were       taken with a cold forceps for histology. Estimated blood loss was       minimal.      Localized mild inflammation characterized by congestion (edema) and       erythema was found in the gastric antrum.      The examined duodenum was normal.      Segmental mild mucosal changes characterized by mosaic patterned mucosa       were found in the gastric fundus and in the gastric body. Biopsies were       taken with a cold forceps for histology. Estimated blood loss was       minimal.      A small hiatal hernia was present.      A low-grade of narrowing Schatzki ring was found at the gastroesophageal       junction. Impression:               - Z-line regular, 40 cm from the incisors.                           - Congested and erythematous mucosa in the gastric  fundus and gastric body. Biopsied.                           - Gastritis.                           - Normal examined duodenum.                           - Mosaic patterned mucosa mucosa in the gastric                            fundus and gastric body. Biopsied.                           - Small hiatal hernia.                           - Low-grade of narrowing Schatzki ring. Moderate  Sedation:      N/A - MAC procedure Recommendation:           - Soft diet.                           - Await pathology results.                           - Rectal bleeding today likely due to fissure or                            other rectal outlet source. If bleeding persists,                            then may need a colonoscopy to further evaluate. If                            bleeding resolves, then do as an outpt.                           - Observe patient's clinical course. Procedure Code(s):        --- Professional ---                           [phone], Esophagogastroduodenoscopy, flexible,                            transoral; with biopsy, single or multiple Diagnosis Code(s):        --- Professional ---                           K22.2, Esophageal obstruction                           K29.70, Gastritis, unspecified, without bleeding                           K31.89, Other diseases of stomach and duodenum  K44.9, Diaphragmatic hernia without obstruction or                            gangrene                           R93.3, Abnormal findings on diagnostic imaging of                            other parts of digestive tract CPT copyright 2019 [employer]. All rights reserved. The codes documented in this report are preliminary and upon coder review may  be revised to meet current compliance requirements. Lear Ng, MD 09/24/2021 2:38:53 PM This report has been signed electronically. Number of Addenda: 0

## 2021-09-24 NOTE — TOC Progression Note (Addendum)
Transition of Care [facility]) - Progression Note    Patient Details  Name: CLEATIS FANDRICH MRN: [id number] Date of Birth: [birth year]  Transition of Care [facility]) CM/SW Contact  Lennart Pall, LCSW Phone Number: 09/24/2021, 10:58 AM  Clinical Narrative:    Met with pt and daughter, [name], this morning to discuss PT recommendations for SNF rehab.  Both aware and very much in agreement with this plan.  Pt is unvaccinated for COVID.  Will begin SNF bed search and insurance authorization.   Expected Discharge Plan: Home/Self Care Barriers to Discharge: No Barriers Identified  Expected Discharge Plan and Services Expected Discharge Plan: Home/Self Care       Living arrangements for the past 2 months: Single Family Home                                       Social Determinants of Health (SDOH) Interventions    Readmission Risk Interventions No flowsheet data found.

## 2021-09-24 NOTE — Transfer of Care (Signed)
Immediate Anesthesia Transfer of Care Note  Patient: Adam Chung  Procedure(s) Performed: ESOPHAGOGASTRODUODENOSCOPY (EGD) WITH PROPOFOL BIOPSY  Patient Location: PACU and Endoscopy Unit  Anesthesia Type:MAC  Level of Consciousness: awake, alert  and oriented  Airway & Oxygen Therapy: Patient Spontanous Breathing and Patient connected to face mask oxygen  Post-op Assessment: Report given to RN and Post -op Vital signs reviewed and stable  Post vital signs: Reviewed and stable  Last Vitals:  Vitals Value Taken Time  BP    Temp    Pulse    Resp 15 09/24/21 1430  SpO2    Vitals shown include unvalidated device data.  Last Pain:  Vitals:   09/24/21 1327  TempSrc: Oral  PainSc: 0-No pain         Complications: No notable events documented.

## 2021-09-24 NOTE — Interval H&P Note (Signed)
History and Physical Interval Note:  09/24/2021 2:06 PM  Adam Chung  has presented today for surgery, with the diagnosis of gastric fundus thickening.  The various methods of treatment have been discussed with the patient and family. After consideration of risks, benefits and other options for treatment, the patient has consented to  Procedure(s): ESOPHAGOGASTRODUODENOSCOPY (EGD) WITH PROPOFOL (N/A) as a surgical intervention.  The patient's history has been reviewed, patient examined, no change in status, stable for surgery.  I have reviewed the patient's chart and labs.  Questions were answered to the patient's satisfaction.     Lear Ng

## 2021-09-24 NOTE — Anesthesia Preprocedure Evaluation (Addendum)
Anesthesia Evaluation  Patient identified by MRN, date of birth, ID band Patient awake    Reviewed: Allergy & Precautions, NPO status , Patient's Chart, lab work & pertinent test results  Airway Mallampati: II  TM Distance: >3 FB Neck ROM: Full    Dental  (+) Poor Dentition, Dental Advisory Given   Pulmonary former smoker,    Pulmonary exam normal breath sounds clear to auscultation       Cardiovascular Normal cardiovascular exam Rhythm:Regular Rate:Normal  Echo 09/21/21: 1. Technically difficult echo with poor image quality.  2. Left ventricular ejection fraction, by estimation, is 60 to 65%. The  left ventricle has normal function. The left ventricle has no regional  wall motion abnormalities. Left ventricular diastolic parameters were  normal.  3. Right ventricular systolic function is mildly reduced. The right  ventricular size is normal.  4. The mitral valve is grossly normal. No evidence of mitral valve  regurgitation.  5. The aortic valve is normal in structure. Aortic valve regurgitation is  not visualized. No aortic stenosis is present.    Neuro/Psych negative neurological ROS  negative psych ROS   GI/Hepatic Neg liver ROS, Gastric fundus thickening    Endo/Other  diabetes, Poorly Controlled, Type 2, Oral Hypoglycemic AgentsLast a1c 12.5  Renal/GU Renal Insufficiency and CRFRenal diseaseCr 2.12 Bladder dysfunction (bladder ca)      Musculoskeletal negative musculoskeletal ROS (+)   Abdominal   Peds  Hematology  (+) Blood dyscrasia, anemia , hct 31.3, plt 197   Anesthesia Other Findings   Reproductive/Obstetrics negative OB ROS                           Anesthesia Physical Anesthesia Plan  ASA: 3  Anesthesia Plan: MAC   Post-op Pain Management:    Induction:   PONV Risk Score and Plan: 2 and Propofol infusion and TIVA  Airway Management Planned: Natural Airway  and Simple Face Mask  Additional Equipment: None  Intra-op Plan:   Post-operative Plan:   Informed Consent: I have reviewed the patients History and Physical, chart, labs and discussed the procedure including the risks, benefits and alternatives for the proposed anesthesia with the patient or authorized representative who has indicated his/her understanding and acceptance.     Dental advisory given  Plan Discussed with: CRNA  Anesthesia Plan Comments:        Anesthesia Quick Evaluation

## 2021-09-24 NOTE — Progress Notes (Signed)
Initial Nutrition Assessment  DOCUMENTATION CODES:   Non-severe (moderate) malnutrition in context of chronic illness  INTERVENTION:   -Ensure MAX Protein po BID, each supplement provides 150 kcal and 30 grams of protein  -Family to bring in [employer] Protein as well  -Will order 30 ml Prosource Plus BID, each supplement provides 100 kcal and 15 grams protein.    -Multivitamin with minerals daily  NUTRITION DIAGNOSIS:   Moderate Malnutrition related to chronic illness as evidenced by percent weight loss, energy intake < or equal to 75% for > or equal to 1 month, moderate fat depletion, moderate muscle depletion.  GOAL:   Patient will meet greater than or equal to 90% of their needs  MONITOR:   Diet advancement, Labs, Weight trends, I & O's  REASON FOR ASSESSMENT:   Consult Assessment of nutrition requirement/status  ASSESSMENT:   70 y.o. male with significant medical history for CKD stage IIIa, type 2 diabetes, chronic anemia, history of bladder cancer on BCG injections developing a bladder infection.  Patient has been in the hospital since 09/20/2021 with altered mental status, found to have DKA urosepsis.  Patient in room with daughter at bedside. Pt eager to have EGD done today. Pt NPO for procedure. Pt's daughter states pt had full liquids all day yesterday, Premier Protein shake, icee, milk, 1/2 milkshake and peaches in the AM. Pt was unable to consume solid foods PTA d/t throat hurting. Pt was subsisting on Ensure supplements (1-2 daily) and popsicles. GI following for gastric fundus thickening and mass, hence EGD.  Pt reports generalized weakness but states he is still strong in his arms, more difficult to stand now.   Per weight records, pt has lost 19 lbs since 9/22 (10% wt loss x 1 month, significant for time frame).   Medications: Vitamin B6  Labs reviewed:  CBGs: 115-207  NUTRITION - FOCUSED PHYSICAL EXAM:  Flowsheet Row Most Recent Value  Orbital Region  Severe depletion  Upper Arm Region Moderate depletion  Thoracic and Lumbar Region Unable to assess  Buccal Region Mild depletion  Temple Region Mild depletion  Clavicle Bone Region Moderate depletion  Clavicle and Acromion Bone Region Moderate depletion  Scapular Bone Region Moderate depletion  Dorsal Hand No depletion  Patellar Region Moderate depletion  Anterior Thigh Region Moderate depletion  Posterior Calf Region Moderate depletion  Hair Reviewed  Eyes Reviewed  Mouth Reviewed  Skin Reviewed  Nails Reviewed       Diet Order:   Diet Order             Diet NPO time specified Except for: Sips with Meds  Diet effective midnight                   EDUCATION NEEDS:   Education needs have been addressed  Skin:  Skin Assessment: Skin Integrity Issues: Skin Integrity Issues:: Stage I Stage I: medial coccyx, left knee  Last BM:  PTA  Height:   Ht Readings from Last 1 Encounters:  09/20/21 5\' 11"  (1.803 m)    Weight:   Wt Readings from Last 1 Encounters:  09/20/21 71.4 kg   BMI:  Body mass index is 21.95 kg/m.  Estimated Nutritional Needs:   Kcal:  2100-2300  Protein:  95-105g  Fluid:  2.1L/day   Clayton Bibles, MS, RD, LDN Inpatient Clinical Dietitian Contact information available via Amion

## 2021-09-24 NOTE — Anesthesia Postprocedure Evaluation (Signed)
Anesthesia Post Note  Patient: Adam Chung  Procedure(s) Performed: ESOPHAGOGASTRODUODENOSCOPY (EGD) WITH PROPOFOL BIOPSY     Patient location during evaluation: PACU Anesthesia Type: MAC Level of consciousness: awake and alert Pain management: pain level controlled Vital Signs Assessment: post-procedure vital signs reviewed and stable Respiratory status: spontaneous breathing, nonlabored ventilation and respiratory function stable Cardiovascular status: blood pressure returned to baseline and stable Postop Assessment: no apparent nausea or vomiting Anesthetic complications: no   No notable events documented.  Last Vitals:  Vitals:   09/24/21 1440 09/24/21 1450  BP: (!) 97/41 (!) 108/51  Pulse: 87 86  Resp: 17 16  Temp:    SpO2: 100% 100%    Last Pain:  Vitals:   09/24/21 1450  TempSrc:   PainSc: 0-No pain                 Pervis Hocking

## 2021-09-25 ENCOUNTER — Encounter (HOSPITAL_COMMUNITY): Payer: Self-pay | Admitting: Gastroenterology

## 2021-09-25 DIAGNOSIS — I10 Essential (primary) hypertension: Secondary | ICD-10-CM | POA: Diagnosis not present

## 2021-09-25 DIAGNOSIS — E875 Hyperkalemia: Secondary | ICD-10-CM | POA: Diagnosis not present

## 2021-09-25 DIAGNOSIS — R933 Abnormal findings on diagnostic imaging of other parts of digestive tract: Secondary | ICD-10-CM | POA: Diagnosis not present

## 2021-09-25 DIAGNOSIS — E1110 Type 2 diabetes mellitus with ketoacidosis without coma: Secondary | ICD-10-CM | POA: Diagnosis not present

## 2021-09-25 LAB — GLUCOSE, CAPILLARY
Glucose-Capillary: 74 mg/dL (ref 70–99)
Glucose-Capillary: 154 mg/dL — ABNORMAL HIGH (ref 70–99)
Glucose-Capillary: 168 mg/dL — ABNORMAL HIGH (ref 70–99)
Glucose-Capillary: 104 mg/dL — ABNORMAL HIGH (ref 70–99)

## 2021-09-25 LAB — BASIC METABOLIC PANEL
Anion gap: 10 (ref 5–15)
BUN: 70 mg/dL — ABNORMAL HIGH (ref 8–23)
CO2: 18 mmol/L — ABNORMAL LOW (ref 22–32)
Calcium: 8.8 mg/dL — ABNORMAL LOW (ref 8.9–10.3)
Chloride: 111 mmol/L (ref 98–111)
Creatinine, Ser: 2.08 mg/dL — ABNORMAL HIGH (ref 0.61–1.24)
GFR, Estimated: 34 mL/min — ABNORMAL LOW (ref >60)
Glucose, Bld: 87 mg/dL (ref 70–99)
Potassium: 4.8 mmol/L (ref 3.5–5.1)
Sodium: 139 mmol/L (ref 135–145)

## 2021-09-25 LAB — CBC
HCT: 30.8 % — ABNORMAL LOW (ref 39.0–52.0)
Hemoglobin: 9.9 g/dL — ABNORMAL LOW (ref 13.0–17.0)
MCH: 29 pg (ref 26.0–34.0)
MCHC: 32.1 g/dL (ref 30.0–36.0)
MCV: 90.3 fL (ref 80.0–100.0)
Platelets: 205 10*3/uL (ref 150–400)
RBC: 3.41 MIL/uL — ABNORMAL LOW (ref 4.22–5.81)
RDW: 14.5 % (ref 11.5–15.5)
WBC: 7.9 10*3/uL (ref 4.0–10.5)
nRBC: 0 % (ref 0.0–0.2)

## 2021-09-25 LAB — HEMOGLOBIN AND HEMATOCRIT, BLOOD
HCT: 34.1 % — ABNORMAL LOW (ref 39.0–52.0)
Hemoglobin: 11 g/dL — ABNORMAL LOW (ref 13.0–17.0)

## 2021-09-25 LAB — SURGICAL PATHOLOGY

## 2021-09-25 MED ORDER — PANTOPRAZOLE SODIUM 40 MG PO TBEC
40.0000 mg | DELAYED_RELEASE_TABLET | Freq: Two times a day (BID) | ORAL | Status: DC
  Administered 2021-09-25 – 2021-09-30 (×10): 40 mg via ORAL
  Filled 2021-09-25 (×10): qty 1

## 2021-09-25 MED ORDER — BISACODYL 10 MG RE SUPP
10.0000 mg | Freq: Once | RECTAL | Status: AC
  Administered 2021-09-25: 10 mg via RECTAL
  Filled 2021-09-25: qty 1

## 2021-09-25 MED ORDER — ENSURE ENLIVE PO LIQD
237.0000 mL | ORAL | Status: DC
  Administered 2021-09-28: 237 mL via ORAL

## 2021-09-25 MED ORDER — SORBITOL 70 % SOLN
30.0000 mL | Status: AC
  Administered 2021-09-25 (×2): 30 mL via ORAL
  Filled 2021-09-25 (×2): qty 30

## 2021-09-25 MED ORDER — METRONIDAZOLE 500 MG PO TABS
500.0000 mg | ORAL_TABLET | Freq: Three times a day (TID) | ORAL | Status: DC
  Administered 2021-09-25 – 2021-09-30 (×14): 500 mg via ORAL
  Filled 2021-09-25 (×15): qty 1

## 2021-09-25 MED ORDER — SENNOSIDES-DOCUSATE SODIUM 8.6-50 MG PO TABS
1.0000 | ORAL_TABLET | Freq: Two times a day (BID) | ORAL | Status: DC
  Administered 2021-09-25 – 2021-09-30 (×5): 1 via ORAL
  Filled 2021-09-25 (×8): qty 1

## 2021-09-25 MED ORDER — BISMUTH SUBSALICYLATE 262 MG PO CHEW
524.0000 mg | CHEWABLE_TABLET | Freq: Three times a day (TID) | ORAL | Status: DC
  Administered 2021-09-25 – 2021-09-27 (×2): 524 mg via ORAL
  Filled 2021-09-25 (×21): qty 2

## 2021-09-25 MED ORDER — TETRACYCLINE HCL 250 MG PO CAPS
500.0000 mg | ORAL_CAPSULE | Freq: Three times a day (TID) | ORAL | Status: DC
  Administered 2021-09-25 – 2021-09-30 (×14): 500 mg via ORAL
  Filled 2021-09-25 (×17): qty 2

## 2021-09-25 NOTE — Progress Notes (Signed)
[facility] Gastroenterology Progress Note  Adam Chung 70 y.o. 15-Aug-1951  CC:  Gastric fundus thickening on CT   Subjective: Patient had endoscopy yesterday.  His last bowel movement was Saturday.  He was given suppository and had a small bowel movement this morning that was black.   Chung hematochezia or blood in the stool. Patient is having abdominal pain due to constipation.   Denies fever, chills. Denies nausea, vomiting, diarrhea.  ROS : Review of Systems  Constitutional:  Positive for weight loss. Negative for chills and fever.  Respiratory:  Negative for shortness of breath.   Cardiovascular:  Negative for chest pain and leg swelling.  Gastrointestinal:  Positive for abdominal pain and constipation. Negative for blood in stool, diarrhea, heartburn, melena, nausea and vomiting.  Skin:  Negative for rash.     Objective: Vital signs in last 24 hours: Vitals:   09/25/21 0529 09/25/21 1310  BP: 131/82 124/89  Pulse: 92 (!) 110  Resp: 18 17  Temp: 98.7 F (37.1 C) 98.4 F (36.9 C)  SpO2: 100% 100%    Physical Exam: General:   Alert, thin, ill appearing male, lying uncomfortably in bed, in NAD Abdomen:  Slightly distended, firm compared to yesterday, Flat AB, skin exam normal, sluggish bowel sounds. Mild lower AB tenderness. Without guarding and Without rebound, without hepatomegaly. Extremities:  Without edema. Neurologic:  Alert and  oriented x4;  grossly normal neurologically. Psych:  Alert and cooperative. Normal mood and affect.   Lab Results: Recent Labs    09/24/21 0418 09/25/21 0347  NA 138 139  K 4.9 4.8  CL 111 111  CO2 18* 18*  GLUCOSE 143* 87  BUN 85* 70*  CREATININE 2.12* 2.08*  CALCIUM 9.2 8.8*   Chung results for input(s): AST, ALT, ALKPHOS, BILITOT, PROT, ALBUMIN in the last 72 hours. Recent Labs    09/24/21 0418 09/25/21 0347  WBC 7.4 7.9  HGB 10.1* 9.9*  HCT 31.3* 30.8*  MCV 89.7 90.3  PLT 197 205   Chung results for input(s): LABPROT, INR  in the last 72 hours.  Lab Results: Results for orders placed or performed during the hospital encounter of 09/20/21 (from the past 48 hour(s))  Glucose, capillary     Status: Abnormal   Collection Time: 09/23/21  4:56 PM  Result Value Ref Range   Glucose-Capillary 207 (H) 70 - 99 mg/dL    Comment: Glucose reference range applies only to samples taken after fasting for at least 8 hours.   Comment 1 Notify RN    Comment 2 Document in Chart   Glucose, capillary     Status: Abnormal   Collection Time: 09/23/21  9:21 PM  Result Value Ref Range   Glucose-Capillary 160 (H) 70 - 99 mg/dL    Comment: Glucose reference range applies only to samples taken after fasting for at least 8 hours.  CBC     Status: Abnormal   Collection Time: 09/24/21  4:18 AM  Result Value Ref Range   WBC 7.4 4.0 - 10.5 K/uL   RBC 3.49 (L) 4.22 - 5.81 MIL/uL   Hemoglobin 10.1 (L) 13.0 - 17.0 g/dL   HCT 31.3 (L) 39.0 - 52.0 %   MCV 89.7 80.0 - 100.0 fL   MCH 28.9 26.0 - 34.0 pg   MCHC 32.3 30.0 - 36.0 g/dL   RDW 14.4 11.5 - 15.5 %   Platelets 197 150 - 400 K/uL   nRBC 0.0 0.0 - 0.2 %  Comment: Performed at [facility], [place] [address] [place], [place] [address]  Basic metabolic panel     Status: Abnormal   Collection Time: 09/24/21  4:18 AM  Result Value Ref Range   Sodium 138 135 - 145 mmol/L   Potassium 4.9 3.5 - 5.1 mmol/L   Chloride 111 98 - 111 mmol/L   CO2 18 (L) 22 - 32 mmol/L   Glucose, Bld 143 (H) 70 - 99 mg/dL    Comment: Glucose reference range applies only to samples taken after fasting for at least 8 hours.   BUN 85 (H) 8 - 23 mg/dL   Creatinine, Ser 2.12 (H) 0.61 - 1.24 mg/dL   Calcium 9.2 8.9 - 10.3 mg/dL   GFR, Estimated 33 (L) >60 mL/min    Comment: (NOTE) Calculated using the CKD-EPI Creatinine Equation (2021)    Anion gap 9 5 - 15    Comment: Performed at [facility], [place] [address], [place], [place] [address]  Glucose, capillary     Status:  Abnormal   Collection Time: 09/24/21  7:23 AM  Result Value Ref Range   Glucose-Capillary 129 (H) 70 - 99 mg/dL    Comment: Glucose reference range applies only to samples taken after fasting for at least 8 hours.   Comment 1 QC Due   Glucose, capillary     Status: Abnormal   Collection Time: 09/24/21 11:06 AM  Result Value Ref Range   Glucose-Capillary 115 (H) 70 - 99 mg/dL    Comment: Glucose reference range applies only to samples taken after fasting for at least 8 hours.  Glucose, capillary     Status: Abnormal   Collection Time: 09/24/21  4:06 PM  Result Value Ref Range   Glucose-Capillary 113 (H) 70 - 99 mg/dL    Comment: Glucose reference range applies only to samples taken after fasting for at least 8 hours.  Glucose, capillary     Status: Abnormal   Collection Time: 09/24/21  9:17 PM  Result Value Ref Range   Glucose-Capillary 129 (H) 70 - 99 mg/dL    Comment: Glucose reference range applies only to samples taken after fasting for at least 8 hours.  CBC     Status: Abnormal   Collection Time: 09/25/21  3:47 AM  Result Value Ref Range   WBC 7.9 4.0 - 10.5 K/uL   RBC 3.41 (L) 4.22 - 5.81 MIL/uL   Hemoglobin 9.9 (L) 13.0 - 17.0 g/dL   HCT 30.8 (L) 39.0 - 52.0 %   MCV 90.3 80.0 - 100.0 fL   MCH 29.0 26.0 - 34.0 pg   MCHC 32.1 30.0 - 36.0 g/dL   RDW 14.5 11.5 - 15.5 %   Platelets 205 150 - 400 K/uL   nRBC 0.0 0.0 - 0.2 %    Comment: Performed at [facility], [place] [address], [place], [place] [address]  Basic metabolic panel     Status: Abnormal   Collection Time: 09/25/21  3:47 AM  Result Value Ref Range   Sodium 139 135 - 145 mmol/L   Potassium 4.8 3.5 - 5.1 mmol/L   Chloride 111 98 - 111 mmol/L   CO2 18 (L) 22 - 32 mmol/L   Glucose, Bld 87 70 - 99 mg/dL    Comment: Glucose reference range applies only to samples taken after fasting for at least 8 hours.   BUN 70 (H) 8 - 23 mg/dL   Creatinine, Ser 2.08 (H) 0.61 -  1.24 mg/dL   Calcium 8.8 (L) 8.9 -  10.3 mg/dL   GFR, Estimated 34 (L) >60 mL/min    Comment: (NOTE) Calculated using the CKD-EPI Creatinine Equation (2021)    Anion gap 10 5 - 15    Comment: Performed at [facility], [place] [address], [place],  [address]  Glucose, capillary     Status: None   Collection Time: 09/25/21  7:40 AM  Result Value Ref Range   Glucose-Capillary 74 70 - 99 mg/dL    Comment: Glucose reference range applies only to samples taken after fasting for at least 8 hours.   Comment 1 Notify RN    Comment 2 Document in Chart   Glucose, capillary     Status: Abnormal   Collection Time: 09/25/21 11:13 AM  Result Value Ref Range   Glucose-Capillary 154 (H) 70 - 99 mg/dL    Comment: Glucose reference range applies only to samples taken after fasting for at least 8 hours.    Assessment/Plan: Gastric fundus thickening  Endoscopy yesterday did not show any gastric masses.   Did show gastritis, pending biopsies. Can start pantoprazole 40 mg once daily. Chronic anemia stable  Endoscopy 09/24/2021 - Z-line regular, 40 cm from the incisors. - Congested and erythematous mucosa in the gastric fundus and gastric body. Biopsied. - Gastritis. - Normal examined duodenum. - Mosaic patterned mucosa mucosa in the gastric fundus and gastric body. Biopsied. - Small hiatal hernia. - Low-grade of narrowing Schatzki ring.  Constipation Appears to have outpatient baseline of constipation Can add on MiraLAX 17 g once or twice daily.   Continue Dulcolax suppositories as needed. If patient continues to have any bleeding can consider colonoscopy inpatient, otherwise can consider outpatient   Vladimir Crofts PA-C 09/25/2021, 2:48 PM  Contact #  [phone]

## 2021-09-25 NOTE — Plan of Care (Signed)
  Problem: Health Behavior/Discharge Planning: Goal: Ability to manage health-related needs will improve Outcome: Progressing   Problem: Clinical Measurements: Goal: Ability to maintain clinical measurements within normal limits will improve Outcome: Progressing Goal: Will remain free from infection Outcome: Progressing Goal: Respiratory complications will improve Outcome: Not Applicable Goal: Cardiovascular complication will be avoided Outcome: Progressing   Problem: Activity: Goal: Risk for activity intolerance will decrease Outcome: Progressing   Problem: Nutrition: Goal: Adequate nutrition will be maintained Outcome: Progressing

## 2021-09-25 NOTE — Progress Notes (Signed)
PROGRESS NOTE    Adam Chung  CHY:[id number] DOB: July 12, 1951 DOA: 09/20/2021 PCP: Rusty Aus, MD   No chief complaint on file.   Brief Narrative:  Adam Chung, 70 y.o. male with PMH of CKD3a, DM, bladder cancer on BCG injection complicated by developing a bladder infection secondary to BCG started on RIPE therapy by ID in [place] brought to the ED for evaluation of confusion.  Family reports patient has not been compliant with his medication and has been having nausea and confusion and attributing this to continue treatment RIP.  Family was concerned that he was not answering his phone, EMS was contacted and found him on the ground on his knees complaining of pain in his gluteal area blood glucose in the field was more than 500. Seen in the ED found to be in DKA due to 8786, acute metabolic encephalopathy concerning for UTI and admitted.Anion gap has closed, DKA resolved patient transition to subcu insulin.  Normally he is on only oral medication at home.   Assessment & Plan:   Principal Problem:   DKA (diabetic ketoacidosis) ([place]) Active Problems:   Hypertension   AKI (acute kidney injury) ([place])   Uncontrolled type 2 diabetes mellitus with hyperglycemia (HCC)   Normocytic anemia   Hyperkalemia   Thrombocytosis   Trigeminy   Hyperphosphatemia   Hypermagnesemia   Pressure injury of skin   Abnormal CT scan, stomach   Malnutrition of moderate degree  #1.DKA/type 2 diabetes mellitus, poorly controlled -Admitted with DKA with blood sugars of 7672, acute metabolic encephalopathy, concern for UTI.  Urinalysis done with glycosuria, ketonuria. -Hemoglobin A1c 12.5 (09/23/2021) -Patient initially placed on the glucose stabilizer and monitored, and placed in the stepdown unit. -Patient hydrated IV fluids. -Patient placed on antibiotics. -Anion gap closed, DKA resolved and patient transition to subcutaneous insulin. -CBG 74 this morning.   -Continue Semglee, SSI.    -May benefit from long-term insulin on discharge.   -Diabetes coordinator following.    2.  Gastric fundus with 3 cm mass versus area of focal thickening -Patient seen in consultation by GI and due to recent weight loss of 4.9 kg/month with recently decreased appetite and recent 9 kg weight loss over the past month GI consulted. -Patient seen by GI underwent upper endoscopy on 09/24/2021 which showed mild diffuse proximal gastric wall thickening with a mosaic pattern, no mass seen, biopsies taken and positive for H. pylori gastritis.  Patient started on quadruple therapy per GI to treat for total of 10 days.  GI recommending outpatient colonoscopy unless rectal bleeding recurs and persist.  -Continue current soft diet. -Per GI.Marland Kitchen   3.  Acute metabolic encephalopathy -Likely secondary to DKA, metabolic derangements, UTI. -Resolved. -Likely at baseline  4.  Sepsis due to UTI, POA -Urine cultures with < 10,0000 colonies however was on antibiotics prior to admission. -Patient treated with aztreonam. -Currently afebrile. -Leukocytosis trended down. -Now on RIPE -Outpatient follow-up with ID.  5.  Recent bladder infection secondary to BCG on ripe therapy per ID in [place] -Dr. Avon Gully discussed with ID and patient back on RIPE therapy which he is tolerating. -Outpatient follow-up with primary ID doc in [place] on discharge.  6.  Hypertension -Stable.  7.  Metabolic acidosis -Likely secondary to CKD. -Placed on bicarb tablets x3 days. -Follow.  8.  Acute kidney injury on chronic kidney disease stage IV -Baseline creatinine approximately 2.0 08/11/2021 with GFR of 34. -Likely secondary to prerenal azotemia in the setting of volume depletion  and DKA. -Creatinine peaked at 5.5 and downtrending with gentle IV fluids. -Creatinine currently at 2.08 this morning. -Gentle hydration for 24 hours. -Avoid nephrotoxic agents. -Outpatient follow-up with PCP.  9.  Anemia of chronic  disease -H&H stable.  10.  Hyperkalemia -Resolved.  11.  Thrombocytosis -Secondary to hemoconcentration on admission, resolved.  12.  Trigeminy -2D echo stable with no wall motion abnormalities. --Continue metoprolol for  #13. hyperphosphatemia/hypermagnesemia -Likely due to hemoconcentration.  Improved  14.  Moderate protein calorie malnutrition -Nutritional supplementation.  15.  Constipation -Sorbitol every 2 hours x2 doses, Dulcolax suppository x1.  16.  Pressure injury of skin, and coccyx and left knee, POA Pressure Injury 09/20/21 Coccyx Medial Stage 1 -  Intact skin with non-blanchable redness of a localized area usually over a bony prominence. (Active)  09/20/21 1530  Location: Coccyx  Location Orientation: Medial  Staging: Stage 1 -  Intact skin with non-blanchable redness of a localized area usually over a bony prominence.  Wound Description (Comments):   Present on Admission: Yes     Pressure Injury 09/20/21 Knee Anterior;Left Stage 1 -  Intact skin with non-blanchable redness of a localized area usually over a bony prominence. (Active)  09/20/21 1530  Location: Knee  Location Orientation: Anterior;Left  Staging: Stage 1 -  Intact skin with non-blanchable redness of a localized area usually over a bony prominence.  Wound Description (Comments):   Present on Admission: Yes         DVT prophylaxis: Heparin Code Status: Full Family Communication: Updated patient and daughter at bedside Disposition:   Status is: Inpatient  Remains inpatient appropriate because: Ongoing management with GI evaluation pending for EGD today, management of uncontrolled diabetes.       Consultants:  Gastroenterology: Dr. Michail Sermon 09/23/2021  Procedures:  Upper endoscopy 09/24/2021 CT abdomen and pelvis 09/20/2021 \\2D  echo 09/21/2021     Antimicrobials:  Ethambutol 09/21/2021>>>>> Isoniazid 09/21/2021>>>>> Rifampin 09/21/2021>>>> Aztreonam 09/20/2021>>>>  09/21/2021    Subjective: Sitting on bedside commode trying to have a bowel movement.  Denies any chest pain.  No shortness of breath.  No abdominal pain.  Tolerating soft diet.  Daughter at bedside.  Per daughter PT feels patient may have some urinary retention.   Objective: Vitals:   09/24/21 1823 09/24/21 2119 09/25/21 0157 09/25/21 0529  BP: 133/83 128/80 134/87 131/82  Pulse: 95 90 95 92  Resp: 18 18 18 18   Temp: 98 F (36.7 C) 99.1 F (37.3 C) 99.3 F (37.4 C) 98.7 F (37.1 C)  TempSrc:  Oral Oral Oral  SpO2: 100% 99% 100% 100%  Weight:      Height:        Intake/Output Summary (Last 24 hours) at 09/25/2021 1052 Last data filed at 09/25/2021 0600 Gross per 24 hour  Intake 1614.19 ml  Output 1800 ml  Net -185.81 ml    Filed Weights   09/20/21 1007 09/20/21 1700 09/24/21 1327  Weight: 80 kg 71.4 kg 71.4 kg    Examination:  General exam: : NAD Respiratory system: CTA B anterior lung fields.  No wheezes, no rhonchi.  Speaking in full sentences.  Normal respiratory effort. Cardiovascular system: Regular rate and rhythm no murmurs rubs or gallops.  No JVD.  No lower extremity edema.  Gastrointestinal system: Abdomen soft, nontender, nondistended, positive bowel sounds.  No rebound.  No guarding. Central nervous system: Alert and oriented. No focal neurological deficits. Extremities: Symmetric 5 x 5 power. Skin: No rashes, lesions or ulcers Psychiatry: Judgement and insight  appear normal. Mood & affect appropriate.   Data Reviewed: I have personally reviewed following labs and imaging studies  CBC: Recent Labs  Lab 09/20/21 1019 09/20/21 1047 09/22/21 0229 09/23/21 0242 09/24/21 0418 09/25/21 0347  WBC 21.2*  --  13.8* 7.7 7.4 7.9  NEUTROABS 19.0*  --   --   --   --   --   HGB 11.5* 11.9* 10.2* 10.0* 10.1* 9.9*  HCT 34.8* 35.0* 30.3* 30.2* 31.3* 30.8*  MCV 87.7  --  86.1 87.5 89.7 90.3  PLT 464*  --  234 211 197 205     Basic Metabolic  Panel: Recent Labs  Lab 09/20/21 1412 09/20/21 1413 09/21/21 0429 09/22/21 0229 09/23/21 0242 09/24/21 0418 09/25/21 0347  NA  --    < > 134* 134* 135 138 139  K  --    < > 5.0 5.0 4.8 4.9 4.8  CL  --    < > 106 107 108 111 111  CO2  --    < > 16* 18* 17* 18* 18*  GLUCOSE  --    < > 148* 225* 144* 143* 87  BUN  --    < > 144* 122* 101* 85* 70*  CREATININE  --    < > 3.64* 3.12* 2.90* 2.12* 2.08*  CALCIUM  --    < > 8.8* 8.9 8.9 9.2 8.8*  MG 3.2*  --   --   --   --   --   --   PHOS 6.5*  --   --   --   --   --   --    < > = values in this interval not displayed.     GFR: Estimated Creatinine Clearance: 33.4 mL/min (A) (by C-G formula based on SCr of 2.08 mg/dL (H)).  Liver Function Tests: No results for input(s): AST, ALT, ALKPHOS, BILITOT, PROT, ALBUMIN in the last 168 hours.  CBG: Recent Labs  Lab 09/24/21 0723 09/24/21 1106 09/24/21 1606 09/24/21 2117 09/25/21 0740  GLUCAP 129* 115* 113* 129* 74      Recent Results (from the past 240 hour(s))  Urine Culture     Status: Abnormal   Collection Time: 09/20/21 10:35 AM   Specimen: Urine, Clean Catch  Result Value Ref Range Status   Specimen Description   Final    URINE, CLEAN CATCH Performed at [facility], [place] [address] [place], [place] [address]    Special Requests   Final    NONE Performed at [facility], [place] [address], [place], [place] [address]    Culture (A)  Final    <10,000 COLONIES/mL INSIGNIFICANT GROWTH Performed at [place] [address], [place], [place] [address]    Report Status 09/21/2021 FINAL  Final  MRSA Next Gen by PCR, Nasal     Status: None   Collection Time: 09/20/21  3:40 PM   Specimen: Nasal Mucosa; Nasal Swab  Result Value Ref Range Status   MRSA by PCR Next Gen NOT DETECTED NOT DETECTED Final    Comment: (NOTE) The GeneXpert MRSA Assay (FDA approved for NASAL specimens only), is one component of a comprehensive MRSA  colonization surveillance program. It is not intended to diagnose MRSA infection nor to guide or monitor treatment for MRSA infections. Test performance is not FDA approved in patients less than 80 years old. Performed at [facility], [place] [address], [place], [place] [address]   Resp  Panel by RT-PCR (Flu A&B, Covid) Nasal Mucosa     Status: None   Collection Time: 09/20/21  3:41 PM   Specimen: Nasal Mucosa; Nasopharyngeal(NP) swabs in vial transport medium  Result Value Ref Range Status   SARS Coronavirus 2 by RT PCR NEGATIVE NEGATIVE Final    Comment: (NOTE) SARS-CoV-2 target nucleic acids are NOT DETECTED.  The SARS-CoV-2 RNA is generally detectable in upper respiratory specimens during the acute phase of infection. The lowest concentration of SARS-CoV-2 viral copies this assay can detect is 138 copies/mL. A negative result does not preclude SARS-Cov-2 infection and should not be used as the sole basis for treatment or other patient management decisions. A negative result may occur with  improper specimen collection/handling, submission of specimen other than nasopharyngeal swab, presence of viral mutation(s) within the areas targeted by this assay, and inadequate number of viral copies(<138 copies/mL). A negative result must be combined with clinical observations, patient history, and epidemiological information. The expected result is Negative.  Fact Sheet for Patients:  EntrepreneurPulse.com.au  Fact Sheet for Healthcare Providers:  IncredibleEmployment.be  This test is no t yet approved or cleared by the [place] FDA and  has been authorized for detection and/or diagnosis of SARS-CoV-2 by FDA under an Emergency Use Authorization (EUA). This EUA will remain  in effect (meaning this test can be used) for the duration of the COVID-19 declaration under Section 564(b)(1) of the Act, 21 U.S.C.section 360bbb-3(b)(1),  unless the authorization is terminated  or revoked sooner.       Influenza A by PCR NEGATIVE NEGATIVE Final   Influenza B by PCR NEGATIVE NEGATIVE Final    Comment: (NOTE) The Xpert Xpress SARS-CoV-2/FLU/RSV plus assay is intended as an aid in the diagnosis of influenza from Nasopharyngeal swab specimens and should not be used as a sole basis for treatment. Nasal washings and aspirates are unacceptable for Xpert Xpress SARS-CoV-2/FLU/RSV testing.  Fact Sheet for Patients: EntrepreneurPulse.com.au  Fact Sheet for Healthcare Providers: IncredibleEmployment.be  This test is not yet approved or cleared by the [place] FDA and has been authorized for detection and/or diagnosis of SARS-CoV-2 by FDA under an Emergency Use Authorization (EUA). This EUA will remain in effect (meaning this test can be used) for the duration of the COVID-19 declaration under Section 564(b)(1) of the Act, 21 U.S.C. section 360bbb-3(b)(1), unless the authorization is terminated or revoked.  Performed at [facility], [place] [address], [place], [place] [address]           Radiology Studies: No results found.      Scheduled Meds:  Chlorhexidine Gluconate Cloth  6 each Topical Q0600   ethambutol  1,200 mg Oral Daily   feeding supplement  237 mL Oral BID BM   heparin injection (subcutaneous)  5,000 Units Subcutaneous Q8H   insulin aspart  0-15 Units Subcutaneous TID WC   insulin aspart  0-5 Units Subcutaneous QHS   insulin glargine-yfgn  30 Units Subcutaneous Daily   isoniazid  300 mg Oral Daily   mouth rinse  15 mL Mouth Rinse BID   pyridOXINE  50 mg Oral Daily   rifampin  600 mg Oral Daily   senna-docusate  1 tablet Oral BID   sodium bicarbonate  650 mg Oral BID   sorbitol  30 mL Oral Q2H   tamsulosin  0.4 mg Oral Daily   Continuous Infusions:  sodium chloride 75 mL/hr at 09/24/21 0817     LOS: 5 days    Time  spent: 40  minutes    Irine Seal, MD [facility]   To contact the attending provider between 7A-7P or the covering provider during after hours 7P-7A, please log into the web site [email or web address] and access using universal [place] password for that web site. If you do not have the password, please call the hospital operator.  09/25/2021, 10:52 AM

## 2021-09-25 NOTE — Care Management Important Message (Signed)
Medicare IM printed for W/L Social Work to give to the patient. 

## 2021-09-25 NOTE — Progress Notes (Signed)
Physical Therapy Treatment Patient Details Name: Adam Chung MRN: [id number] DOB: September 15, 1951 Today's Date: 09/25/2021   History of Present Illness Adam Chung, 70 y.o. male with PMH of CKD3a, DM, bladder cancer,a bladder infection ,brought to the ED for evaluation of confusion, metabolic encephalopathy, DKA.    PT Comments    Pt AxO x 3 but slow and delayed with both cognition and mobility.  Required increased time to complete task and required repeat VC's to complete esp when given 3 step command.  Assisted to bathroom 3 times during session to void and have a BM.  Amb in hallway a limited distance of 32 feet.  General Gait Details: very unsteady gait with limited activity tolerance.  Pt feels "poorly".  Attempted amb without walker as trial but way too unsteady. Pt lives home alone.  Pt will need ST Rehab at SNF prior to safely returning home.   Recommendations for follow up therapy are one component of a multi-disciplinary discharge planning process, led by the attending physician.  Recommendations may be updated based on patient status, additional functional criteria and insurance authorization.  Follow Up Recommendations  Skilled nursing-short term rehab (<3 hours/day)     Assistance Recommended at Discharge None  Equipment Recommendations  None recommended by PT    Recommendations for Other Services       Precautions / Restrictions Precautions Precautions: Fall Restrictions Weight Bearing Restrictions: No     Mobility  Bed Mobility               General bed mobility comments: OOB in recliner    Transfers Overall transfer level: Needs assistance Equipment used: Rolling walker (2 wheels) Transfers: Stand Pivot Transfers Sit to Stand: Min assist Stand pivot transfers: Min assist;Mod assist         General transfer comment: Educated pt to use RW in future due to need for BUE support with transfer.  Also assisted with toilet transfer 3 times.     Ambulation/Gait Ambulation/Gait assistance: Min assist Gait Distance (Feet): 32 Feet Assistive device: Rolling walker (2 wheels) Gait Pattern/deviations: Step-to pattern Gait velocity: decreased   General Gait Details: very unsteady gait with limited activity tolerance.  Pt feels "poorly".  Attempted amb without walker as trial but way too unsteady.   Stairs             Wheelchair Mobility    Modified Rankin (Stroke Patients Only)       Balance                                            Cognition Arousal/Alertness: Awake/alert Behavior During Therapy: WFL for tasks assessed/performed Overall Cognitive Status: Within Functional Limits for tasks assessed                                 General Comments: Pt A&O x4 feeling "poorly"        Exercises      General Comments        Pertinent Vitals/Pain Pain Assessment: Faces Faces Pain Scale: Hurts a little bit Pain Location: to urinate Pain Descriptors / Indicators: Burning Pain Intervention(s): Monitored during session    Home Living  Prior Function            PT Goals (current goals can now be found in the care plan section) Progress towards PT goals: Progressing toward goals    Frequency    Min 2X/week      PT Plan Current plan remains appropriate    Co-evaluation              AM-PAC PT "6 Clicks" Mobility   Outcome Measure  Help needed turning from your back to your side while in a flat bed without using bedrails?: A Little Help needed moving from lying on your back to sitting on the side of a flat bed without using bedrails?: A Little Help needed moving to and from a bed to a chair (including a wheelchair)?: A Little Help needed standing up from a chair using your arms (e.g., wheelchair or bedside chair)?: A Little Help needed to walk in hospital room?: A Lot Help needed climbing 3-5 steps with a railing? : A  Lot 6 Click Score: 16    End of Session Equipment Utilized During Treatment: Gait belt Activity Tolerance: Patient limited by fatigue Patient left: in chair;with call bell/phone within reach;with chair alarm set;with family/visitor present Nurse Communication: Mobility status PT Visit Diagnosis: Unsteadiness on feet (R26.81)     Time: 9373-4287 PT Time Calculation (min) (ACUTE ONLY): 26 min  Charges:  $Gait Training: 8-22 mins $Therapeutic Activity: 8-22 mins                     Rica Koyanagi  PTA Acute  Rehabilitation Services Pager      [phone] Office      [phone]

## 2021-09-26 DIAGNOSIS — R933 Abnormal findings on diagnostic imaging of other parts of digestive tract: Secondary | ICD-10-CM | POA: Diagnosis not present

## 2021-09-26 DIAGNOSIS — E1110 Type 2 diabetes mellitus with ketoacidosis without coma: Secondary | ICD-10-CM | POA: Diagnosis not present

## 2021-09-26 DIAGNOSIS — E875 Hyperkalemia: Secondary | ICD-10-CM | POA: Diagnosis not present

## 2021-09-26 DIAGNOSIS — I10 Essential (primary) hypertension: Secondary | ICD-10-CM | POA: Diagnosis not present

## 2021-09-26 LAB — BASIC METABOLIC PANEL
Anion gap: 10 (ref 5–15)
BUN: 69 mg/dL — ABNORMAL HIGH (ref 8–23)
CO2: 16 mmol/L — ABNORMAL LOW (ref 22–32)
Calcium: 8.5 mg/dL — ABNORMAL LOW (ref 8.9–10.3)
Chloride: 110 mmol/L (ref 98–111)
Creatinine, Ser: 2.34 mg/dL — ABNORMAL HIGH (ref 0.61–1.24)
GFR, Estimated: 29 mL/min — ABNORMAL LOW (ref >60)
Glucose, Bld: 73 mg/dL (ref 70–99)
Potassium: 4.2 mmol/L (ref 3.5–5.1)
Sodium: 136 mmol/L (ref 135–145)

## 2021-09-26 LAB — CBC
HCT: 31.2 % — ABNORMAL LOW (ref 39.0–52.0)
Hemoglobin: 9.8 g/dL — ABNORMAL LOW (ref 13.0–17.0)
MCH: 28.6 pg (ref 26.0–34.0)
MCHC: 31.4 g/dL (ref 30.0–36.0)
MCV: 91 fL (ref 80.0–100.0)
Platelets: 214 10*3/uL (ref 150–400)
RBC: 3.43 MIL/uL — ABNORMAL LOW (ref 4.22–5.81)
RDW: 14.3 % (ref 11.5–15.5)
WBC: 8.7 10*3/uL (ref 4.0–10.5)
nRBC: 0 % (ref 0.0–0.2)

## 2021-09-26 LAB — GLUCOSE, CAPILLARY
Glucose-Capillary: 51 mg/dL — ABNORMAL LOW (ref 70–99)
Glucose-Capillary: 114 mg/dL — ABNORMAL HIGH (ref 70–99)
Glucose-Capillary: 177 mg/dL — ABNORMAL HIGH (ref 70–99)
Glucose-Capillary: 68 mg/dL — ABNORMAL LOW (ref 70–99)
Glucose-Capillary: 80 mg/dL (ref 70–99)

## 2021-09-26 MED ORDER — INSULIN GLARGINE-YFGN 100 UNIT/ML ~~LOC~~ SOLN
10.0000 [IU] | Freq: Every day | SUBCUTANEOUS | Status: DC
  Administered 2021-09-27 – 2021-09-30 (×4): 10 [IU] via SUBCUTANEOUS
  Filled 2021-09-26 (×4): qty 0.1

## 2021-09-26 MED ORDER — SODIUM BICARBONATE 650 MG PO TABS
1300.0000 mg | ORAL_TABLET | Freq: Two times a day (BID) | ORAL | Status: DC
  Administered 2021-09-27: 1300 mg via ORAL
  Filled 2021-09-26 (×4): qty 2

## 2021-09-26 MED ORDER — INSULIN GLARGINE-YFGN 100 UNIT/ML ~~LOC~~ SOLN
20.0000 [IU] | Freq: Every day | SUBCUTANEOUS | Status: DC
  Filled 2021-09-26: qty 0.2

## 2021-09-26 NOTE — Plan of Care (Signed)
  Problem: Health Behavior/Discharge Planning: Goal: Ability to manage health-related needs will improve Outcome: Progressing   Problem: Clinical Measurements: Goal: Ability to maintain clinical measurements within normal limits will improve Outcome: Progressing Goal: Will remain free from infection Outcome: Progressing Goal: Respiratory complications will improve Outcome: Not Applicable Goal: Cardiovascular complication will be avoided Outcome: Progressing   Problem: Activity: Goal: Risk for activity intolerance will decrease Outcome: Progressing   Problem: Nutrition: Goal: Adequate nutrition will be maintained Outcome: Progressing   Problem: Metabolic: Goal: Ability to maintain appropriate glucose levels will improve Outcome: Progressing   Problem: Skin Integrity: Goal: Risk for impaired skin integrity will decrease Outcome: Progressing

## 2021-09-26 NOTE — Progress Notes (Signed)
PROGRESS NOTE    Adam Chung  JQB:[id number] DOB: 10-15-1951 DOA: 09/20/2021 PCP: Rusty Aus, MD   No chief complaint on file.   Brief Narrative:  Adam Chung, 70 y.o. male with PMH of CKD3a, DM, bladder cancer on BCG injection complicated by developing a bladder infection secondary to BCG started on RIPE therapy by ID in [place] brought to the ED for evaluation of confusion.  Family reports patient has not been compliant with his medication and has been having nausea and confusion and attributing this to continue treatment RIP.  Family was concerned that he was not answering his phone, EMS was contacted and found him on the ground on his knees complaining of pain in his gluteal area blood glucose in the field was more than 500. Seen in the ED found to be in DKA due to 4097, acute metabolic encephalopathy concerning for UTI and admitted.Anion gap has closed, DKA resolved patient transition to subcu insulin.  Normally he is on only oral medication at home.   Assessment & Plan:   Principal Problem:   DKA (diabetic ketoacidosis) ([place]) Active Problems:   Hypertension   AKI (acute kidney injury) ([place])   Uncontrolled type 2 diabetes mellitus with hyperglycemia (H[facility])   Normocytic anemia   Hyperkalemia   Thrombocytosis   Trigeminy   Hyperphosphatemia   Hypermagnesemia   Pressure injury of skin   Abnormal CT scan, stomach   Malnutrition of moderate degree  1.DKA/type 2 diabetes mellitus, poorly controlled -Admitted with DKA with blood sugars of 3532, acute metabolic encephalopathy, concern for UTI.  Urinalysis done with glycosuria, ketonuria. -Hemoglobin A1c 12.5 (09/23/2021) -Patient initially placed on the glucose stabilizer and monitored, and placed in the stepdown unit. -Patient hydrated IV fluids. -Patient placed on antibiotics. -Anion gap closed, DKA resolved and patient transition to subcutaneous insulin. -CBG 51 this morning however patient asymptomatic..    -Decrease Semglee to 10 units daily.  -SSI.  -Discontinue bedtime insulin coverage.  2.  Gastric fundus with 3 cm mass versus area of focal thickening/H. pylori gastritis -Patient seen in consultation by GI and due to recent weight loss of 4.9 kg/month with recently decreased appetite and recent 9 kg weight loss over the past month GI consulted. -Patient seen by GI underwent upper endoscopy on 09/24/2021 which showed mild diffuse proximal gastric wall thickening with a mosaic pattern, no mass seen, biopsies taken and positive for H. pylori gastritis.  Patient started on quadruple therapy per GI to treat for total of 10 days.  GI recommending outpatient colonoscopy unless rectal bleeding recurs and persist.  -Continue current soft diet. -Per GI.Marland Kitchen   3.  Acute metabolic encephalopathy -Likely secondary to DKA, metabolic derangements, UTI. -Resolved. -Likely at baseline  4.  Sepsis due to UTI, POA -Urine cultures with < 10,0000 colonies however was on antibiotics prior to admission. -Patient treated with aztreonam. -Currently afebrile. -Leukocytosis trended down and resolved. -Now on RIPE -Outpatient follow-up with ID.  5.  Recent bladder infection secondary to BCG on ripe therapy per ID in [place] -Dr. Avon Gully discussed with ID and patient back on RIPE therapy which he is tolerating. -Outpatient follow-up with primary ID doc in [place] on discharge.  6.  Hypertension -Stable.  7.  Metabolic acidosis -Likely secondary to CKD. -Increase bicarb tablets to 1300 mg twice daily x3 days.   -Follow.  8.  Acute kidney injury on chronic kidney disease stage IV -Baseline creatinine approximately 2.0 08/11/2021 with GFR of 34. -Likely secondary to prerenal  azotemia in the setting of volume depletion and DKA. -Creatinine peaked at 5.5 and downtrending with gentle IV fluids. -Creatinine currently at 2.34.  -Continue gentle hydration with IV fluids.  -Avoid nephrotoxic agents.   -Outpatient follow-up with PCP.  9.  Anemia of chronic disease -H&H stable at 9.8.  10.  Hyperkalemia -Resolved. -Potassium of 4.2.  11.  Thrombocytosis -Secondary to hemoconcentration on admission, resolved.  12.  Trigeminy -2D echo stable with no wall motion abnormalities. -- Metoprolol.  #13. hyperphosphatemia/hypermagnesemia -Likely due to hemoconcentration.  Improved  14.  Moderate protein calorie malnutrition -Nutritional supplementation.  15.  Constipation -Patient with bowel movements after being placed on sorbitol and Dulcolax suppository.   -Follow.    16.  Pressure injury of skin, and coccyx and left knee, POA Pressure Injury 09/20/21 Coccyx Medial Stage 1 -  Intact skin with non-blanchable redness of a localized area usually over a bony prominence. (Active)  09/20/21 1530  Location: Coccyx  Location Orientation: Medial  Staging: Stage 1 -  Intact skin with non-blanchable redness of a localized area usually over a bony prominence.  Wound Description (Comments):   Present on Admission: Yes     Pressure Injury 09/20/21 Knee Anterior;Left Stage 1 -  Intact skin with non-blanchable redness of a localized area usually over a bony prominence. (Active)  09/20/21 1530  Location: Knee  Location Orientation: Anterior;Left  Staging: Stage 1 -  Intact skin with non-blanchable redness of a localized area usually over a bony prominence.  Wound Description (Comments):   Present on Admission: Yes         DVT prophylaxis: Heparin Code Status: Full Family Communication: Updated patient and brother at bedside Disposition:   Status is: Inpatient  Remains inpatient appropriate because: Ongoing management with GI evaluation pending for EGD today, management of uncontrolled diabetes.       Consultants:  Gastroenterology: Dr. Michail Sermon 09/23/2021  Procedures:  Upper endoscopy 09/24/2021 CT abdomen and pelvis 09/20/2021 \\2D  echo  09/21/2021     Antimicrobials:  Ethambutol 09/21/2021>>>>> Isoniazid 09/21/2021>>>>> Rifampin 09/21/2021>>>> Aztreonam 09/20/2021>>>> 09/21/2021    Subjective: Patient laying in bed.  Denies any chest pain.  No shortness of breath.  Stated having bowel movements and feeling better.  No dysuria.  Brother at bedside.    Objective: Vitals:   09/25/21 0529 09/25/21 1310 09/25/21 2154 09/26/21 0541  BP: 131/82 124/89 115/72 133/70  Pulse: 92 (!) 110 (!) 102 92  Resp: 18 17 20 18   Temp: 98.7 F (37.1 C) 98.4 F (36.9 C) 98.1 F (36.7 C) 98.2 F (36.8 C)  TempSrc: Oral Oral Oral Oral  SpO2: 100% 100% 100% 100%  Weight:      Height:        Intake/Output Summary (Last 24 hours) at 09/26/2021 1245 Last data filed at 09/26/2021 1000 Gross per 24 hour  Intake 1838.99 ml  Output 800 ml  Net 1038.99 ml    Filed Weights   09/20/21 1007 09/20/21 1700 09/24/21 1327  Weight: 80 kg 71.4 kg 71.4 kg    Examination:  General exam: : NAD Respiratory system: CTA B.  No wheezes, no rhonchi.  Speaking in full sentences.  Normal respiratory effort. Cardiovascular system: Regular rate and rhythm no murmurs rubs or gallops.  No JVD.  No lower extremity edema.  Gastrointestinal system: Abdomen soft, nontender, nondistended, positive bowel sounds.  No rebound.  No guarding. Central nervous system: Alert and oriented. No focal neurological deficits. Extremities: Symmetric 5 x 5 power. Skin: No rashes,  lesions or ulcers Psychiatry: Judgement and insight appear normal. Mood & affect appropriate.  Data Reviewed: I have personally reviewed following labs and imaging studies  CBC: Recent Labs  Lab 09/20/21 1019 09/20/21 1047 09/22/21 0229 09/23/21 0242 09/24/21 0418 09/25/21 0347 09/25/21 1438 09/26/21 0426  WBC 21.2*  --  13.8* 7.7 7.4 7.9  --  8.7  NEUTROABS 19.0*  --   --   --   --   --   --   --   HGB 11.5*   < > 10.2* 10.0* 10.1* 9.9* 11.0* 9.8*  HCT 34.8*   < > 30.3* 30.2*  31.3* 30.8* 34.1* 31.2*  MCV 87.7  --  86.1 87.5 89.7 90.3  --  91.0  PLT 464*  --  234 211 197 205  --  214   < > = values in this interval not displayed.     Basic Metabolic Panel: Recent Labs  Lab 09/20/21 1412 09/20/21 1413 09/22/21 0229 09/23/21 0242 09/24/21 0418 09/25/21 0347 09/26/21 0426  NA  --    < > 134* 135 138 139 136  K  --    < > 5.0 4.8 4.9 4.8 4.2  CL  --    < > 107 108 111 111 110  CO2  --    < > 18* 17* 18* 18* 16*  GLUCOSE  --    < > 225* 144* 143* 87 73  BUN  --    < > 122* 101* 85* 70* 69*  CREATININE  --    < > 3.12* 2.90* 2.12* 2.08* 2.34*  CALCIUM  --    < > 8.9 8.9 9.2 8.8* 8.5*  MG 3.2*  --   --   --   --   --   --   PHOS 6.5*  --   --   --   --   --   --    < > = values in this interval not displayed.     GFR: Estimated Creatinine Clearance: 29.7 mL/min (A) (by C-G formula based on SCr of 2.34 mg/dL (H)).  Liver Function Tests: No results for input(s): AST, ALT, ALKPHOS, BILITOT, PROT, ALBUMIN in the last 168 hours.  CBG: Recent Labs  Lab 09/25/21 1113 09/25/21 1603 09/25/21 2051 09/26/21 0754 09/26/21 0838  GLUCAP 154* 168* 104* 51* 114*      Recent Results (from the past 240 hour(s))  Urine Culture     Status: Abnormal   Collection Time: 09/20/21 10:35 AM   Specimen: Urine, Clean Catch  Result Value Ref Range Status   Specimen Description   Final    URINE, CLEAN CATCH Performed at [facility], [place] [address] [place], [place] [address]    Special Requests   Final    NONE Performed at [facility], [place] [address] [place], [place] [address]    Culture (A)  Final    <10,000 COLONIES/mL INSIGNIFICANT GROWTH Performed at [facility] [address], [place], [place] [address]    Report Status 09/21/2021 FINAL  Final  MRSA Next Gen by PCR, Nasal     Status: None   Collection Time: 09/20/21  3:40 PM   Specimen: Nasal Mucosa; Nasal Swab  Result Value Ref Range Status   MRSA by  PCR Next Gen NOT DETECTED NOT DETECTED Final    Comment: (NOTE) The GeneXpert MRSA Assay (FDA approved for NASAL specimens only), is one component of a comprehensive MRSA  colonization surveillance program. It is not intended to diagnose MRSA infection nor to guide or monitor treatment for MRSA infections. Test performance is not FDA approved in patients less than 34 years old. Performed at [facility], [place] [address] [place], [place] [address]   Resp Panel by RT-PCR (Flu A&B, Covid) Nasal Mucosa     Status: None   Collection Time: 09/20/21  3:41 PM   Specimen: Nasal Mucosa; Nasopharyngeal(NP) swabs in vial transport medium  Result Value Ref Range Status   SARS Coronavirus 2 by RT PCR NEGATIVE NEGATIVE Final    Comment: (NOTE) SARS-CoV-2 target nucleic acids are NOT DETECTED.  The SARS-CoV-2 RNA is generally detectable in upper respiratory specimens during the acute phase of infection. The lowest concentration of SARS-CoV-2 viral copies this assay can detect is 138 copies/mL. A negative result does not preclude SARS-Cov-2 infection and should not be used as the sole basis for treatment or other patient management decisions. A negative result may occur with  improper specimen collection/handling, submission of specimen other than nasopharyngeal swab, presence of viral mutation(s) within the areas targeted by this assay, and inadequate number of viral copies(<138 copies/mL). A negative result must be combined with clinical observations, patient history, and epidemiological information. The expected result is Negative.  Fact Sheet for Patients:  EntrepreneurPulse.com.au  Fact Sheet for Healthcare Providers:  IncredibleEmployment.be  This test is no t yet approved or cleared by the [place] FDA and  has been authorized for detection and/or diagnosis of SARS-CoV-2 by FDA under an Emergency Use Authorization (EUA). This EUA  will remain  in effect (meaning this test can be used) for the duration of the COVID-19 declaration under Section 564(b)(1) of the Act, 21 U.S.C.section 360bbb-3(b)(1), unless the authorization is terminated  or revoked sooner.       Influenza A by PCR NEGATIVE NEGATIVE Final   Influenza B by PCR NEGATIVE NEGATIVE Final    Comment: (NOTE) The Xpert Xpress SARS-CoV-2/FLU/RSV plus assay is intended as an aid in the diagnosis of influenza from Nasopharyngeal swab specimens and should not be used as a sole basis for treatment. Nasal washings and aspirates are unacceptable for Xpert Xpress SARS-CoV-2/FLU/RSV testing.  Fact Sheet for Patients: EntrepreneurPulse.com.au  Fact Sheet for Healthcare Providers: IncredibleEmployment.be  This test is not yet approved or cleared by the [place] FDA and has been authorized for detection and/or diagnosis of SARS-CoV-2 by FDA under an Emergency Use Authorization (EUA). This EUA will remain in effect (meaning this test can be used) for the duration of the COVID-19 declaration under Section 564(b)(1) of the Act, 21 U.S.C. section 360bbb-3(b)(1), unless the authorization is terminated or revoked.  Performed at [facility], [place] [address], [place],  [address]           Radiology Studies: No results found.      Scheduled Meds:  bismuth subsalicylate  062 mg Oral TID AC & HS   Chlorhexidine Gluconate Cloth  6 each Topical Q0600   ethambutol  1,200 mg Oral Daily   feeding supplement  237 mL Oral 2 times per day   heparin injection (subcutaneous)  5,000 Units Subcutaneous Q8H   insulin aspart  0-15 Units Subcutaneous TID WC   insulin aspart  0-5 Units Subcutaneous QHS   insulin glargine-yfgn  20 Units Subcutaneous Daily   isoniazid  300 mg Oral Daily   mouth rinse  15 mL Mouth Rinse BID   metroNIDAZOLE  500 mg Oral TID  pantoprazole  40 mg Oral BID   pyridOXINE  50  mg Oral Daily   rifampin  600 mg Oral Daily   senna-docusate  1 tablet Oral BID   sodium bicarbonate  650 mg Oral BID   tamsulosin  0.4 mg Oral Daily   tetracycline  500 mg Oral TID   Continuous Infusions:  sodium chloride 100 mL/hr at 09/26/21 1234     LOS: 6 days    Time spent: 40 minutes    Irine Seal, MD [facility]   To contact the attending provider between 7A-7P or the covering provider during after hours 7P-7A, please log into the web site [email or web address] and access using universal [place] password for that web site. If you do not have the password, please call the hospital operator.  09/26/2021, 12:45 PM

## 2021-09-26 NOTE — Progress Notes (Signed)
Patients blood glucose was checked at 0754 and was 51, patient given 4oz of orange juice and his blood glucose was rechecked at 0815 and was 71.

## 2021-09-26 NOTE — Progress Notes (Addendum)
Patient had loose blood tinged stool tonight. Will continue to monitor.

## 2021-09-27 DIAGNOSIS — E440 Moderate protein-calorie malnutrition: Secondary | ICD-10-CM

## 2021-09-27 DIAGNOSIS — E1110 Type 2 diabetes mellitus with ketoacidosis without coma: Secondary | ICD-10-CM | POA: Diagnosis not present

## 2021-09-27 DIAGNOSIS — N179 Acute kidney failure, unspecified: Secondary | ICD-10-CM | POA: Diagnosis not present

## 2021-09-27 DIAGNOSIS — E875 Hyperkalemia: Secondary | ICD-10-CM | POA: Diagnosis not present

## 2021-09-27 LAB — CBC
HCT: 30.3 % — ABNORMAL LOW (ref 39.0–52.0)
Hemoglobin: 9.5 g/dL — ABNORMAL LOW (ref 13.0–17.0)
MCH: 28.6 pg (ref 26.0–34.0)
MCHC: 31.4 g/dL (ref 30.0–36.0)
MCV: 91.3 fL (ref 80.0–100.0)
Platelets: 198 10*3/uL (ref 150–400)
RBC: 3.32 MIL/uL — ABNORMAL LOW (ref 4.22–5.81)
RDW: 14.4 % (ref 11.5–15.5)
WBC: 6.9 10*3/uL (ref 4.0–10.5)
nRBC: 0 % (ref 0.0–0.2)

## 2021-09-27 LAB — BASIC METABOLIC PANEL
Anion gap: 8 (ref 5–15)
BUN: 58 mg/dL — ABNORMAL HIGH (ref 8–23)
CO2: 16 mmol/L — ABNORMAL LOW (ref 22–32)
Calcium: 8.2 mg/dL — ABNORMAL LOW (ref 8.9–10.3)
Chloride: 115 mmol/L — ABNORMAL HIGH (ref 98–111)
Creatinine, Ser: 2.03 mg/dL — ABNORMAL HIGH (ref 0.61–1.24)
GFR, Estimated: 35 mL/min — ABNORMAL LOW (ref >60)
Glucose, Bld: 77 mg/dL (ref 70–99)
Potassium: 4.6 mmol/L (ref 3.5–5.1)
Sodium: 139 mmol/L (ref 135–145)

## 2021-09-27 LAB — MAGNESIUM: Magnesium: 1.7 mg/dL (ref 1.7–2.4)

## 2021-09-27 LAB — GLUCOSE, CAPILLARY: Glucose-Capillary: 127 mg/dL — ABNORMAL HIGH (ref 70–99)

## 2021-09-27 MED ORDER — SODIUM CHLORIDE 0.45 % IV SOLN: INTRAVENOUS | Status: AC

## 2021-09-27 MED ORDER — MAGNESIUM SULFATE 2 GM/50ML IV SOLN
2.0000 g | Freq: Once | INTRAVENOUS | Status: AC
  Administered 2021-09-27: 2 g via INTRAVENOUS
  Filled 2021-09-27: qty 50

## 2021-09-27 NOTE — Progress Notes (Signed)
PROGRESS NOTE    Adam Chung  ZOX:[id number] DOB: 07/18/1951 DOA: 09/20/2021 PCP: Rusty Aus, MD   No chief complaint on file.   Brief Narrative:  Adam Chung, 70 y.o. male with PMH of CKD3a, DM, bladder cancer on BCG injection complicated by developing a bladder infection secondary to BCG started on RIPE therapy by ID in [place] brought to the ED for evaluation of confusion.  Family reports patient has not been compliant with his medication and has been having nausea and confusion and attributing this to continue treatment RIP.  Family was concerned that he was not answering his phone, EMS was contacted and found him on the ground on his knees complaining of pain in his gluteal area blood glucose in the field was more than 500. Seen in the ED found to be in DKA due to 8119, acute metabolic encephalopathy concerning for UTI and admitted.Anion gap has closed, DKA resolved patient transition to subcu insulin.  Normally he is on only oral medication at home.   Assessment & Plan:   Principal Problem:   DKA (diabetic ketoacidosis) ([place]) Active Problems:   Hypertension   AKI (acute kidney injury) ([place])   Uncontrolled type 2 diabetes mellitus with hyperglycemia ([facility])   Normocytic anemia   Hyperkalemia   Thrombocytosis   Trigeminy   Hyperphosphatemia   Hypermagnesemia   Pressure injury of skin   Abnormal CT scan, stomach   Malnutrition of moderate degree  1.DKA/type 2 diabetes mellitus, poorly controlled -Admitted with DKA with blood sugars of 1478, acute metabolic encephalopathy, concern for UTI.  Urinalysis done with glycosuria, ketonuria. -Hemoglobin A1c 12.5 (09/23/2021) -Patient initially placed on the glucose stabilizer and monitored, and placed in the stepdown unit. -Patient hydrated IV fluids. -Patient placed on antibiotics. -Anion gap closed, DKA resolved and patient transition to subcutaneous insulin. -CBG 51 the morning of 09/26/2021 however patient  remained asymptomatic and Semglee decreased to 10 units daily.   -CBG this morning at 127.   -Continue Semglee 10 units daily, SSI.   2.  Gastric fundus with 3 cm mass versus area of focal thickening/H. pylori gastritis -Patient seen in consultation by GI and due to recent weight loss of 4.9 kg/month with recently decreased appetite and recent 9 kg weight loss over the past month GI consulted. -Patient seen by GI underwent upper endoscopy on 09/24/2021 which showed mild diffuse proximal gastric wall thickening with a mosaic pattern, no mass seen, biopsies taken and positive for H. pylori gastritis.  Patient started on quadruple therapy per GI to treat for total of 10 days.  GI recommending outpatient colonoscopy unless rectal bleeding recurs and persist.  -Continue current soft diet. -Per GI.Marland Kitchen   3.  Acute metabolic encephalopathy -Likely secondary to DKA, metabolic derangements, UTI. -Resolved. -Likely at baseline  4.  Sepsis due to UTI, POA -Urine cultures with < 10,0000 colonies however was on antibiotics prior to admission. -Patient treated with aztreonam. -Currently afebrile. -Leukocytosis trended down and resolved. -Now on RIPE -Outpatient follow-up with ID.  5.  Recent bladder infection secondary to BCG on ripe therapy per ID in [place] -Dr. Avon Gully discussed with ID and patient back on RIPE therapy which he is tolerating. -Outpatient follow-up with primary ID doc in [place] on discharge.  6.  Hypertension -Stable.  7.  Metabolic acidosis -Likely secondary to CKD. -Continue bicarb tablets 1300 mg twice daily x2 more days.  -Follow.  8.  Acute kidney injury on chronic kidney disease stage IV -Baseline creatinine approximately  2.0 08/11/2021 with GFR of 34. -Likely secondary to prerenal azotemia in the setting of volume depletion and DKA. -Creatinine peaked at 5.5 and downtrending with gentle IV fluids. -Creatinine currently at 2.03.  -Continue gentle hydration with  IV fluids.  -Avoid nephrotoxic agents. -Outpatient follow-up with PCP.  9.  Anemia of chronic disease -H&H stable at 9.5.  10.  Hyperkalemia -Resolved. -Potassium of 4.6.  11.  Thrombocytosis -Secondary to hemoconcentration on admission, resolved.  12.  Trigeminy -2D echo stable with no wall motion abnormalities. --Continue metoprolol as needed.  #13. hyperphosphatemia/hypermagnesemia -Likely due to hemoconcentration.  Magnesium of 1.7.  -Magnesium sulfate 2 g IV x1. -Repeat labs in the morning   14.  Moderate protein calorie malnutrition -Continue Nutritional supplementation.  15.  Constipation -Patient with bowel movements after being placed on sorbitol and Dulcolax suppository.   -Follow.    16.  Pressure injury of skin, and coccyx and left knee, POA Pressure Injury 09/20/21 Coccyx Medial Stage 1 -  Intact skin with non-blanchable redness of a localized area usually over a bony prominence. (Active)  09/20/21 1530  Location: Coccyx  Location Orientation: Medial  Staging: Stage 1 -  Intact skin with non-blanchable redness of a localized area usually over a bony prominence.  Wound Description (Comments):   Present on Admission: Yes     Pressure Injury 09/20/21 Knee Anterior;Left Stage 1 -  Intact skin with non-blanchable redness of a localized area usually over a bony prominence. (Active)  09/20/21 1530  Location: Knee  Location Orientation: Anterior;Left  Staging: Stage 1 -  Intact skin with non-blanchable redness of a localized area usually over a bony prominence.  Wound Description (Comments):   Present on Admission: Yes         DVT prophylaxis: Heparin Code Status: Full Family Communication: Updated patient and brother at bedside Disposition:   Status is: Inpatient  Remains inpatient appropriate because: Ongoing management with GI evaluation pending for EGD today, management of uncontrolled diabetes.       Consultants:  Gastroenterology: Dr.  Michail Sermon 09/23/2021  Procedures:  Upper endoscopy 09/24/2021 CT abdomen and pelvis 09/20/2021 2D echo 09/21/2021     Antimicrobials:  Ethambutol 09/21/2021>>>>> Isoniazid 09/21/2021>>>>> Rifampin 09/21/2021>>>> Aztreonam 09/20/2021>>>> 09/21/2021    Subjective: Laying in bed.  No chest pain.  No shortness of breath.  No abdominal pain.  Denies any bloody bowel movements.  No dysuria.  Overall feeling better.    Objective: Vitals:   09/26/21 0541 09/26/21 1307 09/26/21 2032 09/27/21 0531  BP: 133/70 (!) 123/50 124/76 134/74  Pulse: 92 85 95 93  Resp: 18 18 16 16   Temp: 98.2 F (36.8 C) 98.5 F (36.9 C) 99.3 F (37.4 C) 98.9 F (37.2 C)  TempSrc: Oral Oral Oral Oral  SpO2: 100% 100% 99% 99%  Weight:      Height:        Intake/Output Summary (Last 24 hours) at 09/27/2021 1125 Last data filed at 09/27/2021 1000 Gross per 24 hour  Intake 2043.28 ml  Output 2275 ml  Net -231.72 ml    Filed Weights   09/20/21 1007 09/20/21 1700 09/24/21 1327  Weight: 80 kg 71.4 kg 71.4 kg    Examination:  General exam: : NAD Respiratory system: CTA B.  No wheezes, no rhonchi.  Speaking in full sentences.  Normal respiratory effort. Cardiovascular system: Regular rate and rhythm no murmurs rubs or gallops.  No JVD.  No lower extremity edema.  Gastrointestinal system: Abdomen soft, nontender, nondistended, positive bowel  sounds.  No rebound.  No guarding. Central nervous system: Alert and oriented. No focal neurological deficits. Extremities: Symmetric 5 x 5 power. Skin: No rashes, lesions or ulcers Psychiatry: Judgement and insight appear normal. Mood & affect appropriate.   Data Reviewed: I have personally reviewed following labs and imaging studies  CBC: Recent Labs  Lab 09/23/21 0242 09/24/21 0418 09/25/21 0347 09/25/21 1438 09/26/21 0426 09/27/21 0423  WBC 7.7 7.4 7.9  --  8.7 6.9  HGB 10.0* 10.1* 9.9* 11.0* 9.8* 9.5*  HCT 30.2* 31.3* 30.8* 34.1* 31.2* 30.3*   MCV 87.5 89.7 90.3  --  91.0 91.3  PLT 211 197 205  --  214 198     Basic Metabolic Panel: Recent Labs  Lab 09/20/21 1412 09/20/21 1413 09/23/21 0242 09/24/21 0418 09/25/21 0347 09/26/21 0426 09/27/21 0423  NA  --    < > 135 138 139 136 139  K  --    < > 4.8 4.9 4.8 4.2 4.6  CL  --    < > 108 111 111 110 115*  CO2  --    < > 17* 18* 18* 16* 16*  GLUCOSE  --    < > 144* 143* 87 73 77  BUN  --    < > 101* 85* 70* 69* 58*  CREATININE  --    < > 2.90* 2.12* 2.08* 2.34* 2.03*  CALCIUM  --    < > 8.9 9.2 8.8* 8.5* 8.2*  MG 3.2*  --   --   --   --   --  1.7  PHOS 6.5*  --   --   --   --   --   --    < > = values in this interval not displayed.     GFR: Estimated Creatinine Clearance: 34.2 mL/min (A) (by C-G formula based on SCr of 2.03 mg/dL (H)).  Liver Function Tests: No results for input(s): AST, ALT, ALKPHOS, BILITOT, PROT, ALBUMIN in the last 168 hours.  CBG: Recent Labs  Lab 09/26/21 0838 09/26/21 1149 09/26/21 1608 09/26/21 2031 09/27/21 0749  GLUCAP 114* 177* 68* 80 127*      Recent Results (from the past 240 hour(s))  Urine Culture     Status: Abnormal   Collection Time: 09/20/21 10:35 AM   Specimen: Urine, Clean Catch  Result Value Ref Range Status   Specimen Description   Final    URINE, CLEAN CATCH Performed at [facility], [place] [address], [place], [place] [address]    Special Requests   Final    NONE Performed at [facility], [place] [address], [place], [place] [address]    Culture (A)  Final    <10,000 COLONIES/mL INSIGNIFICANT GROWTH Performed at [facility] [address] [place], [place] [address]    Report Status 09/21/2021 FINAL  Final  MRSA Next Gen by PCR, Nasal     Status: None   Collection Time: 09/20/21  3:40 PM   Specimen: Nasal Mucosa; Nasal Swab  Result Value Ref Range Status   MRSA by PCR Next Gen NOT DETECTED NOT DETECTED Final    Comment: (NOTE) The GeneXpert MRSA Assay (FDA  approved for NASAL specimens only), is one component of a comprehensive MRSA colonization surveillance program. It is not intended to diagnose MRSA infection nor to guide or monitor treatment for MRSA infections. Test performance is not FDA approved in patients less than 105 years old. Performed at [facility]  [place] [address] [place], [place] [address]   Resp Panel by RT-PCR (Flu A&B, Covid) Nasal Mucosa     Status: None   Collection Time: 09/20/21  3:41 PM   Specimen: Nasal Mucosa; Nasopharyngeal(NP) swabs in vial transport medium  Result Value Ref Range Status   SARS Coronavirus 2 by RT PCR NEGATIVE NEGATIVE Final    Comment: (NOTE) SARS-CoV-2 target nucleic acids are NOT DETECTED.  The SARS-CoV-2 RNA is generally detectable in upper respiratory specimens during the acute phase of infection. The lowest concentration of SARS-CoV-2 viral copies this assay can detect is 138 copies/mL. A negative result does not preclude SARS-Cov-2 infection and should not be used as the sole basis for treatment or other patient management decisions. A negative result may occur with  improper specimen collection/handling, submission of specimen other than nasopharyngeal swab, presence of viral mutation(s) within the areas targeted by this assay, and inadequate number of viral copies(<138 copies/mL). A negative result must be combined with clinical observations, patient history, and epidemiological information. The expected result is Negative.  Fact Sheet for Patients:  EntrepreneurPulse.com.au  Fact Sheet for Healthcare Providers:  IncredibleEmployment.be  This test is no t yet approved or cleared by the [place] FDA and  has been authorized for detection and/or diagnosis of SARS-CoV-2 by FDA under an Emergency Use Authorization (EUA). This EUA will remain  in effect (meaning this test can be used) for the duration of the COVID-19  declaration under Section 564(b)(1) of the Act, 21 U.S.C.section 360bbb-3(b)(1), unless the authorization is terminated  or revoked sooner.       Influenza A by PCR NEGATIVE NEGATIVE Final   Influenza B by PCR NEGATIVE NEGATIVE Final    Comment: (NOTE) The Xpert Xpress SARS-CoV-2/FLU/RSV plus assay is intended as an aid in the diagnosis of influenza from Nasopharyngeal swab specimens and should not be used as a sole basis for treatment. Nasal washings and aspirates are unacceptable for Xpert Xpress SARS-CoV-2/FLU/RSV testing.  Fact Sheet for Patients: EntrepreneurPulse.com.au  Fact Sheet for Healthcare Providers: IncredibleEmployment.be  This test is not yet approved or cleared by the [place] FDA and has been authorized for detection and/or diagnosis of SARS-CoV-2 by FDA under an Emergency Use Authorization (EUA). This EUA will remain in effect (meaning this test can be used) for the duration of the COVID-19 declaration under Section 564(b)(1) of the Act, 21 U.S.C. section 360bbb-3(b)(1), unless the authorization is terminated or revoked.  Performed at [facility], [place] [address] [place], [place] [address]           Radiology Studies: No results found.      Scheduled Meds:  bismuth subsalicylate  818 mg Oral TID AC & HS   Chlorhexidine Gluconate Cloth  6 each Topical Q0600   ethambutol  1,200 mg Oral Daily   feeding supplement  237 mL Oral 2 times per day   heparin injection (subcutaneous)  5,000 Units Subcutaneous Q8H   insulin aspart  0-15 Units Subcutaneous TID WC   insulin glargine-yfgn  10 Units Subcutaneous Daily   isoniazid  300 mg Oral Daily   mouth rinse  15 mL Mouth Rinse BID   metroNIDAZOLE  500 mg Oral TID   pantoprazole  40 mg Oral BID   pyridOXINE  50 mg Oral Daily   rifampin  600 mg Oral Daily   senna-docusate  1 tablet Oral BID   sodium bicarbonate  1,300 mg Oral BID    tamsulosin  0.4 mg Oral Daily   tetracycline  500 mg Oral TID   Continuous Infusions:  sodium chloride 100 mL/hr at 09/27/21 0852     LOS: 7 days    Time spent: 40 minutes    Irine Seal, MD [facility]   To contact the attending provider between 7A-7P or the covering provider during after hours 7P-7A, please log into the web site [email or web address] and access using universal [place] password for that web site. If you do not have the password, please call the hospital operator.  09/27/2021, 11:25 AM

## 2021-09-28 DIAGNOSIS — E1110 Type 2 diabetes mellitus with ketoacidosis without coma: Secondary | ICD-10-CM | POA: Diagnosis not present

## 2021-09-28 DIAGNOSIS — N179 Acute kidney failure, unspecified: Secondary | ICD-10-CM | POA: Diagnosis not present

## 2021-09-28 DIAGNOSIS — E875 Hyperkalemia: Secondary | ICD-10-CM | POA: Diagnosis not present

## 2021-09-28 LAB — GLUCOSE, CAPILLARY
Glucose-Capillary: 71 mg/dL (ref 70–99)
Glucose-Capillary: 98 mg/dL (ref 70–99)
Glucose-Capillary: 240 mg/dL — ABNORMAL HIGH (ref 70–99)
Glucose-Capillary: 231 mg/dL — ABNORMAL HIGH (ref 70–99)
Glucose-Capillary: 173 mg/dL — ABNORMAL HIGH (ref 70–99)
Glucose-Capillary: 262 mg/dL — ABNORMAL HIGH (ref 70–99)
Glucose-Capillary: 165 mg/dL — ABNORMAL HIGH (ref 70–99)

## 2021-09-28 LAB — BASIC METABOLIC PANEL
Anion gap: 9 (ref 5–15)
BUN: 50 mg/dL — ABNORMAL HIGH (ref 8–23)
CO2: 15 mmol/L — ABNORMAL LOW (ref 22–32)
Calcium: 8.1 mg/dL — ABNORMAL LOW (ref 8.9–10.3)
Chloride: 112 mmol/L — ABNORMAL HIGH (ref 98–111)
Creatinine, Ser: 2 mg/dL — ABNORMAL HIGH (ref 0.61–1.24)
GFR, Estimated: 35 mL/min — ABNORMAL LOW (ref >60)
Glucose, Bld: 207 mg/dL — ABNORMAL HIGH (ref 70–99)
Potassium: 4.6 mmol/L (ref 3.5–5.1)
Sodium: 136 mmol/L (ref 135–145)

## 2021-09-28 LAB — MAGNESIUM: Magnesium: 2 mg/dL (ref 1.7–2.4)

## 2021-09-28 LAB — HEMOGLOBIN AND HEMATOCRIT, BLOOD
HCT: 30.6 % — ABNORMAL LOW (ref 39.0–52.0)
Hemoglobin: 10 g/dL — ABNORMAL LOW (ref 13.0–17.0)

## 2021-09-28 MED ORDER — STERILE WATER FOR INJECTION IV SOLN
INTRAVENOUS | Status: DC
  Filled 2021-09-28 (×2): qty 1000
  Filled 2021-09-28: qty 150

## 2021-09-28 MED ORDER — PROSOURCE PLUS PO LIQD
30.0000 mL | Freq: Two times a day (BID) | ORAL | Status: DC
  Administered 2021-09-28 – 2021-09-30 (×3): 30 mL via ORAL
  Filled 2021-09-28 (×4): qty 30

## 2021-09-28 MED ORDER — ENSURE MAX PROTEIN PO LIQD
11.0000 [oz_av] | Freq: Two times a day (BID) | ORAL | Status: DC
  Administered 2021-09-28 – 2021-09-30 (×2): 11 [oz_av] via ORAL

## 2021-09-28 MED ORDER — LOPERAMIDE HCL 2 MG PO CAPS: 2.0000 mg | ORAL_CAPSULE | ORAL | Status: DC | PRN

## 2021-09-28 MED ORDER — LOPERAMIDE HCL 2 MG PO CAPS
4.0000 mg | ORAL_CAPSULE | Freq: Once | ORAL | Status: AC
  Administered 2021-09-28: 4 mg via ORAL
  Filled 2021-09-28: qty 2

## 2021-09-28 MED ORDER — ADULT MULTIVITAMIN W/MINERALS CH
1.0000 | ORAL_TABLET | Freq: Every day | ORAL | Status: DC
  Administered 2021-09-28 – 2021-09-30 (×3): 1 via ORAL
  Filled 2021-09-28 (×3): qty 1

## 2021-09-28 MED ORDER — SODIUM BICARBONATE 650 MG PO TABS
1300.0000 mg | ORAL_TABLET | Freq: Three times a day (TID) | ORAL | Status: DC
  Filled 2021-09-28 (×6): qty 2

## 2021-09-28 NOTE — Progress Notes (Signed)
Physical Therapy Treatment Patient Details Name: Adam Chung MRN: [id number] DOB: 07/10/1951 Today's Date: 09/28/2021   History of Present Illness Adam Chung, 70 y.o. male with PMH of CKD3a, DM, bladder cancer,a bladder infection ,brought to the ED for evaluation of confusion, metabolic encephalopathy, DKA.    PT Comments    Pt ambulated 170' with RW with min/guard assist for balance, distance limited by fatigue. He is tolerating increased ambulation distance this session.    Recommendations for follow up therapy are one component of a multi-disciplinary discharge planning process, led by the attending physician.  Recommendations may be updated based on patient status, additional functional criteria and insurance authorization.  Follow Up Recommendations  Skilled nursing-short term rehab (<3 hours/day)     Assistance Recommended at Discharge Intermittent Supervision/Assistance  Equipment Recommendations  None recommended by PT    Recommendations for Other Services       Precautions / Restrictions Precautions Precautions: Fall Restrictions Weight Bearing Restrictions: No     Mobility  Bed Mobility Overal bed mobility: Modified Independent Bed Mobility: Supine to Sit     Supine to sit: Modified independent (Device/Increase time);HOB elevated     General bed mobility comments: used bedrail    Transfers Overall transfer level: Needs assistance Equipment used: Rolling walker (2 wheels) Transfers: Sit to/from Stand Sit to Stand: Min guard           General transfer comment: VCs hand placement, min/guard safety    Ambulation/Gait Ambulation/Gait assistance: Min guard Gait Distance (Feet): 170 Feet Assistive device: Rolling walker (2 wheels) Gait Pattern/deviations: Step-through pattern;Trunk flexed Gait velocity: decreased   General Gait Details: no loss of balance, distance limited by fatigue   Stairs             Wheelchair Mobility     Modified Rankin (Stroke Patients Only)       Balance Overall balance assessment: Needs assistance Sitting-balance support: No upper extremity supported;Feet supported Sitting balance-Leahy Scale: Good     Standing balance support: Bilateral upper extremity supported Standing balance-Leahy Scale: Fair                              Cognition Arousal/Alertness: Awake/alert Behavior During Therapy: WFL for tasks assessed/performed Overall Cognitive Status: Within Functional Limits for tasks assessed                                 General Comments: Pt A&O x4        Exercises      General Comments        Pertinent Vitals/Pain Faces Pain Scale: Hurts little more Pain Location: abdomen 2* diarrhea Pain Descriptors / Indicators: Aching Pain Intervention(s): Limited activity within patient's tolerance;Monitored during session    Home Living                          Prior Function            PT Goals (current goals can now be found in the care plan section) Acute Rehab PT Goals Patient Stated Goal: to walk PT Goal Formulation: With patient/family Time For Goal Achievement: 10/07/21 Potential to Achieve Goals: Good Progress towards PT goals: Progressing toward goals    Frequency    Min 2X/week      PT Plan Current plan remains appropriate    Co-evaluation  AM-PAC PT "6 Clicks" Mobility   Outcome Measure  Help needed turning from your back to your side while in a flat bed without using bedrails?: A Little Help needed moving from lying on your back to sitting on the side of a flat bed without using bedrails?: A Little Help needed moving to and from a bed to a chair (including a wheelchair)?: A Little Help needed standing up from a chair using your arms (e.g., wheelchair or bedside chair)?: A Little Help needed to walk in hospital room?: A Little Help needed climbing 3-5 steps with a railing? : A Lot 6  Click Score: 17    End of Session Equipment Utilized During Treatment: Gait belt Activity Tolerance: Patient limited by fatigue Patient left: in bed;with call bell/phone within reach Nurse Communication: Mobility status PT Visit Diagnosis: Unsteadiness on feet (R26.81)     Time: 4037-0964 PT Time Calculation (min) (ACUTE ONLY): 17 min  Charges:  $Gait Training: 8-22 mins                    Blondell Reveal Kistler PT 09/28/2021  Acute Rehabilitation Services Pager [phone] Office [phone]

## 2021-09-28 NOTE — Progress Notes (Signed)
PROGRESS NOTE    [name]  DXI:[id number] DOB: 02/05/1951 DOA: 09/20/2021 PCP: Rusty Aus, MD   No chief complaint on file.   Brief Narrative:  Adam Chung, 70 y.o. 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  Family reports patient has not been compliant with his medication and has been having nausea and confusion and attributing this to continue treatment RIP.  Family was concerned that he was not answering his phone, EMS was contacted and found him on the ground on his knees complaining of pain in his gluteal area blood glucose in the field was more than 500. Seen in the ED found to be in DKA due to 7673, acute metabolic encephalopathy concerning for UTI and admitted.Anion gap has closed, DKA resolved patient transition to subcu insulin.  Normally he is on only oral medication at home.   Assessment & Plan:   Principal Problem:   DKA (diabetic ketoacidosis) ([place]) Active Problems:   Hypertension   AKI (acute kidney injury) ([place])   Uncontrolled type 2 diabetes mellitus with hyperglycemia (HCC)   Normocytic anemia   Hyperkalemia   Thrombocytosis   Trigeminy   Hyperphosphatemia   Hypermagnesemia   Pressure injury of skin   Abnormal CT scan, stomach   Malnutrition of moderate degree  1.DKA/type 2 diabetes mellitus, poorly controlled -Admitted with DKA with blood sugars of 4193, acute metabolic encephalopathy, concern for UTI.  Urinalysis done with glycosuria, ketonuria. -Hemoglobin A1c 12.5 (09/23/2021) -Patient initially placed on the glucose stabilizer and monitored, and placed in the stepdown unit. -Patient hydrated IV fluids. -Patient placed on antibiotics. -Anion gap closed, DKA resolved and patient transition to subcutaneous insulin. -CBG 51 the morning of 09/26/2021 however patient  remained asymptomatic and Semglee decreased to 10 units daily.   -CBG this morning at 173.   -Continue Semglee 10 units daily, SSI.   2.  Gastric fundus with 3 cm mass versus area of focal thickening/H. pylori gastritis -Patient seen in consultation by GI and due to recent weight loss of 4.9 kg/month with recently decreased appetite and recent 9 kg weight loss over the past month GI consulted. -Patient seen by GI underwent upper endoscopy on 09/24/2021 which showed mild diffuse proximal gastric wall thickening with a mosaic pattern, no mass seen, biopsies taken and positive for H. pylori gastritis.  Patient started on quadruple therapy per GI to treat for total of 10 days.  GI recommending outpatient colonoscopy unless rectal bleeding recurs and persist.  -Continue current soft diet. -Per GI.Marland Kitchen   3.  Acute metabolic encephalopathy -Likely secondary to DKA, metabolic derangements, UTI. -Resolved. -Likely at baseline  4.  Sepsis due to UTI, POA -Urine cultures with < 10,0000 colonies however was on antibiotics prior to admission. -Patient treated with aztreonam. -Currently afebrile. -Leukocytosis trended down and resolved. -Now on RIPE -Outpatient follow-up with ID.  5.  Recent bladder infection secondary to BCG on ripe therapy per ID in [place] -Dr. Avon Gully discussed with ID and patient back on RIPE therapy which he is tolerating. -Outpatient follow-up with primary ID doc in [place] on discharge.  6.  Hypertension -Stable.  7.  Metabolic acidosis -Likely secondary to CKD in the setting of multiple loose watery stools. -Bicarb tablets ordered however patient noted to be refusing and as such no significant improvement with metabolic acidosis. -Place  on a bicarb drip. -Follow.  8.  Acute kidney injury on chronic kidney disease stage IV -Baseline creatinine approximately 2.0 08/11/2021 with GFR of 34. -Likely secondary to prerenal azotemia in the setting of volume depletion and  DKA. -Creatinine peaked at 5.5 and downtrending with gentle IV fluids. -Creatinine currently at 2.00.  -Continue gentle hydration with IV fluids.  -Avoid nephrotoxic agents. -Outpatient follow-up with PCP.  9.  Anemia of chronic disease -H&H stable at 10.  10.  Hyperkalemia -Resolved. -Potassium of 4.6.  11.  Thrombocytosis -Secondary to hemoconcentration on admission, resolved.  12.  Trigeminy -2D echo stable with no wall motion abnormalities. --Continue metoprolol as needed.  #13. hyperphosphatemia/hypermagnesemia -Likely due to hemoconcentration.  Magnesium of 2 -Repeat labs in the morning   14.  Moderate protein calorie malnutrition -Continue Nutritional supplementation.  15.  Constipation -Patient with bowel movements after being placed on sorbitol and Dulcolax suppository.  -Patient with multiple loose stools. -Imodium x1.  Imodium as needed for loose stools.  16.  Pressure injury of skin, and coccyx and left knee, POA Pressure Injury 09/20/21 Coccyx Medial Stage 1 -  Intact skin with non-blanchable redness of a localized area usually over a bony prominence. (Active)  09/20/21 1530  Location: Coccyx  Location Orientation: Medial  Staging: Stage 1 -  Intact skin with non-blanchable redness of a localized area usually over a bony prominence.  Wound Description (Comments):   Present on Admission: Yes     Pressure Injury 09/20/21 Knee Anterior;Left Stage 1 -  Intact skin with non-blanchable redness of a localized area usually over a bony prominence. (Active)  09/20/21 1530  Location: Knee  Location Orientation: Anterior;Left  Staging: Stage 1 -  Intact skin with non-blanchable redness of a localized area usually over a bony prominence.  Wound Description (Comments):   Present on Admission: Yes         DVT prophylaxis: Heparin Code Status: Full Family Communication: Updated patient, no family at bedside. Disposition:   Status is: Inpatient  Remains  inpatient appropriate because: Ongoing management with GI evaluation pending for EGD today, management of uncontrolled diabetes.       Consultants:  Gastroenterology: Dr. Michail Sermon 09/23/2021  Procedures:  Upper endoscopy 09/24/2021 CT abdomen and pelvis 09/20/2021 2D echo 09/21/2021     Antimicrobials:  Ethambutol 09/21/2021>>>>> Isoniazid 09/21/2021>>>>> Rifampin 09/21/2021>>>> Aztreonam 09/20/2021>>>> 09/21/2021    Subjective: Patient laying in bed.  No chest pain.  No shortness of breath.  No abdominal pain.  Stated still having multiple watery loose stools.  Noted to have refused bicarb tablets as he states was told would cause more diarrhea.  Tolerating current diet.  Patient noted to be refusing bicarb tablets.  Objective: Vitals:   09/27/21 1355 09/27/21 1512 09/27/21 2121 09/28/21 0556  BP: 112/72 127/71 (!) 142/63 (!) 147/68  Pulse: (!) 101 95 93 92  Resp: 18 18 18 18   Temp: 97.9 F (36.6 C) 97.9 F (36.6 C) 98.2 F (36.8 C) 97.7 F (36.5 C)  TempSrc: Oral Oral Oral Oral  SpO2:  100% 100% 100%  Weight:      Height:        Intake/Output Summary (Last 24 hours) at 09/28/2021 1040 Last data filed at 09/28/2021 1000 Gross per 24 hour  Intake 2266.27 ml  Output 600 ml  Net 1666.27 ml    Filed Weights   09/20/21 1007 09/20/21 1700 09/24/21 1327  Weight: 80 kg 71.4 kg 71.4 kg    Examination:  General exam: :  NAD Respiratory system: CTA B.  No wheezes, no rhonchi.  Speaking in full sentences.  Normal respiratory effort. Cardiovascular system: Regular rate and rhythm no murmurs rubs or gallops.  No JVD.  No lower extremity edema.  Gastrointestinal system: Abdomen soft, nontender, nondistended, positive bowel sounds.  No rebound.  No guarding. Central nervous system: Alert and oriented. No focal neurological deficits. Extremities: Symmetric 5 x 5 power. Skin: No rashes, lesions or ulcers Psychiatry: Judgement and insight appear normal. Mood & affect  appropriate.  Data Reviewed: I have personally reviewed following labs and imaging studies  CBC: Recent Labs  Lab 09/23/21 0242 09/24/21 0418 09/25/21 0347 09/25/21 1438 09/26/21 0426 09/27/21 0423 09/28/21 0419  WBC 7.7 7.4 7.9  --  8.7 6.9  --   HGB 10.0* 10.1* 9.9* 11.0* 9.8* 9.5* 10.0*  HCT 30.2* 31.3* 30.8* 34.1* 31.2* 30.3* 30.6*  MCV 87.5 89.7 90.3  --  91.0 91.3  --   PLT 211 197 205  --  214 198  --      Basic Metabolic Panel: Recent Labs  Lab 09/24/21 0418 09/25/21 0347 09/26/21 0426 09/27/21 0423 09/28/21 0419  NA 138 139 136 139 136  K 4.9 4.8 4.2 4.6 4.6  CL 111 111 110 115* 112*  CO2 18* 18* 16* 16* 15*  GLUCOSE 143* 87 73 77 207*  BUN 85* 70* 69* 58* 50*  CREATININE 2.12* 2.08* 2.34* 2.03* 2.00*  CALCIUM 9.2 8.8* 8.5* 8.2* 8.1*  MG  --   --   --  1.7 2.0     GFR: Estimated Creatinine Clearance: 34.7 mL/min (A) (by C-G formula based on SCr of 2 mg/dL (H)).  Liver Function Tests: No results for input(s): AST, ALT, ALKPHOS, BILITOT, PROT, ALBUMIN in the last 168 hours.  CBG: Recent Labs  Lab 09/27/21 0749 09/27/21 1124 09/27/21 1624 09/27/21 2128 09/28/21 0722  GLUCAP 127* 98 240* 231* 173*      Recent Results (from the past 240 hour(s))  Urine Culture     Status: Abnormal   Collection Time: 09/20/21 10:35 AM   Specimen: Urine, Clean Catch  Result Value Ref Range Status   Specimen Description   Final    URINE, CLEAN CATCH Performed at [facility], [place] [address] [place], [place] [address]    Special Requests   Final    NONE Performed at [facility], [place] [address], [place], [place] [address]    Culture (A)  Final    <10,000 COLONIES/mL INSIGNIFICANT GROWTH Performed at [place] [address], [place], [place] [address]    Report Status 09/21/2021 FINAL  Final  MRSA Next Gen by PCR, Nasal     Status: None   Collection Time: 09/20/21  3:40 PM   Specimen: Nasal Mucosa; Nasal  Swab  Result Value Ref Range Status   MRSA by PCR Next Gen NOT DETECTED NOT DETECTED Final    Comment: (NOTE) The GeneXpert MRSA Assay (FDA approved for NASAL specimens only), is one component of a comprehensive MRSA colonization surveillance program. It is not intended to diagnose MRSA infection nor to guide or monitor treatment for MRSA infections. Test performance is not FDA approved in patients less than 22 years old. Performed at [facility], [place] [address] [place], [place] [address]   Resp Panel by RT-PCR (Flu A&B, Covid) Nasal Mucosa     Status: None   Collection Time: 09/20/21  3:41 PM   Specimen: Nasal  Mucosa; Nasopharyngeal(NP) swabs in vial transport medium  Result Value Ref Range Status   SARS Coronavirus 2 by RT PCR NEGATIVE NEGATIVE Final    Comment: (NOTE) SARS-CoV-2 target nucleic acids are NOT DETECTED.  The SARS-CoV-2 RNA is generally detectable in upper respiratory specimens during the acute phase of infection. The lowest concentration of SARS-CoV-2 viral copies this assay can detect is 138 copies/mL. A negative result does not preclude SARS-Cov-2 infection and should not be used as the sole basis for treatment or other patient management decisions. A negative result may occur with  improper specimen collection/handling, submission of specimen other than nasopharyngeal swab, presence of viral mutation(s) within the areas targeted by this assay, and inadequate number of viral copies(<138 copies/mL). A negative result must be combined with clinical observations, patient history, and epidemiological information. The expected result is Negative.  Fact Sheet for Patients:  EntrepreneurPulse.com.au  Fact Sheet for Healthcare Providers:  IncredibleEmployment.be  This test is no t yet approved or cleared by the [place] FDA and  has been authorized for detection and/or diagnosis of SARS-CoV-2 by FDA under  an Emergency Use Authorization (EUA). This EUA will remain  in effect (meaning this test can be used) for the duration of the COVID-19 declaration under Section 564(b)(1) of the Act, 21 U.S.C.section 360bbb-3(b)(1), unless the authorization is terminated  or revoked sooner.       Influenza A by PCR NEGATIVE NEGATIVE Final   Influenza B by PCR NEGATIVE NEGATIVE Final    Comment: (NOTE) The Xpert Xpress SARS-CoV-2/FLU/RSV plus assay is intended as an aid in the diagnosis of influenza from Nasopharyngeal swab specimens and should not be used as a sole basis for treatment. Nasal washings and aspirates are unacceptable for Xpert Xpress SARS-CoV-2/FLU/RSV testing.  Fact Sheet for Patients: EntrepreneurPulse.com.au  Fact Sheet for Healthcare Providers: IncredibleEmployment.be  This test is not yet approved or cleared by the [place] FDA and has been authorized for detection and/or diagnosis of SARS-CoV-2 by FDA under an Emergency Use Authorization (EUA). This EUA will remain in effect (meaning this test can be used) for the duration of the COVID-19 declaration under Section 564(b)(1) of the Act, 21 U.S.C. section 360bbb-3(b)(1), unless the authorization is terminated or revoked.  Performed at [facility], [place] [address], [place], [place] [address]           Radiology Studies: No results found.      Scheduled Meds:  (feeding supplement) PROSource Plus  30 mL Oral BID BM   bismuth subsalicylate  469 mg Oral TID AC & HS   Chlorhexidine Gluconate Cloth  6 each Topical Q0600   ethambutol  1,200 mg Oral Daily   heparin injection (subcutaneous)  5,000 Units Subcutaneous Q8H   insulin aspart  0-15 Units Subcutaneous TID WC   insulin glargine-yfgn  10 Units Subcutaneous Daily   isoniazid  300 mg Oral Daily   loperamide  4 mg Oral Once   mouth rinse  15 mL Mouth Rinse BID   metroNIDAZOLE  500 mg Oral TID    multivitamin with minerals  1 tablet Oral Daily   pantoprazole  40 mg Oral BID   Ensure Max Protein  11 oz Oral BID   pyridOXINE  50 mg Oral Daily   rifampin  600 mg Oral Daily   senna-docusate  1 tablet Oral BID   sodium bicarbonate  1,300 mg Oral TID   tamsulosin  0.4 mg Oral Daily   tetracycline  500 mg Oral  TID   Continuous Infusions:   sodium bicarbonate (isotonic) infusion in sterile water       LOS: 8 days    Time spent: 35 minutes    Irine Seal, MD [facility]   To contact the attending provider between 7A-7P or the covering provider during after hours 7P-7A, please log into the web site [email or web address] and access using universal [place] password for that web site. If you do not have the password, please call the hospital operator.  09/28/2021, 10:40 AM

## 2021-09-28 NOTE — TOC Progression Note (Addendum)
Transition of Care [facility]) - Progression Note    Patient Details  Name: Adam Chung MRN: [id number] Date of Birth: August 14, 1951  Transition of [facility]) CM/SW Contact  Lennart Pall, LCSW Phone Number: 09/28/2021, 11:42 AM  Clinical Narrative:    Have received SNF bed offer per [facility], however, pt not yet medically ready for dc.  Hopefully, bed will remain available (attempting to confirm with facility). Have updated pt and daughter.  MD notes possibly ready 1-2 days.   Expected Discharge Plan: Home/Self Care Barriers to Discharge: No Barriers Identified  Expected Discharge Plan and Services Expected Discharge Plan: Home/Self Care       Living arrangements for the past 2 months: Single Family Home                                       Social Determinants of Health (SDOH) Interventions    Readmission Risk Interventions No flowsheet data found.

## 2021-09-29 ENCOUNTER — Other Ambulatory Visit: Payer: Medicare Other

## 2021-09-29 ENCOUNTER — Encounter: Payer: Self-pay | Admitting: Urology

## 2021-09-29 ENCOUNTER — Encounter (HOSPITAL_COMMUNITY): Payer: Self-pay | Admitting: Internal Medicine

## 2021-09-29 DIAGNOSIS — R339 Retention of urine, unspecified: Secondary | ICD-10-CM

## 2021-09-29 DIAGNOSIS — N35912 Unspecified bulbous urethral stricture, male: Secondary | ICD-10-CM

## 2021-09-29 DIAGNOSIS — R933 Abnormal findings on diagnostic imaging of other parts of digestive tract: Secondary | ICD-10-CM | POA: Diagnosis not present

## 2021-09-29 DIAGNOSIS — R338 Other retention of urine: Secondary | ICD-10-CM

## 2021-09-29 DIAGNOSIS — E1110 Type 2 diabetes mellitus with ketoacidosis without coma: Secondary | ICD-10-CM | POA: Diagnosis not present

## 2021-09-29 DIAGNOSIS — E875 Hyperkalemia: Secondary | ICD-10-CM | POA: Diagnosis not present

## 2021-09-29 DIAGNOSIS — I10 Essential (primary) hypertension: Secondary | ICD-10-CM | POA: Diagnosis not present

## 2021-09-29 LAB — BASIC METABOLIC PANEL
Anion gap: 7 (ref 5–15)
BUN: 41 mg/dL — ABNORMAL HIGH (ref 8–23)
CO2: 24 mmol/L (ref 22–32)
Calcium: 7.6 mg/dL — ABNORMAL LOW (ref 8.9–10.3)
Chloride: 105 mmol/L (ref 98–111)
Creatinine, Ser: 1.89 mg/dL — ABNORMAL HIGH (ref 0.61–1.24)
GFR, Estimated: 38 mL/min — ABNORMAL LOW (ref >60)
Glucose, Bld: 156 mg/dL — ABNORMAL HIGH (ref 70–99)
Potassium: 3.9 mmol/L (ref 3.5–5.1)
Sodium: 136 mmol/L (ref 135–145)

## 2021-09-29 LAB — CBC
HCT: 27.5 % — ABNORMAL LOW (ref 39.0–52.0)
Hemoglobin: 9 g/dL — ABNORMAL LOW (ref 13.0–17.0)
MCH: 29.1 pg (ref 26.0–34.0)
MCHC: 32.7 g/dL (ref 30.0–36.0)
MCV: 89 fL (ref 80.0–100.0)
Platelets: 170 10*3/uL (ref 150–400)
RBC: 3.09 MIL/uL — ABNORMAL LOW (ref 4.22–5.81)
RDW: 14.5 % (ref 11.5–15.5)
WBC: 6.3 10*3/uL (ref 4.0–10.5)
nRBC: 0 % (ref 0.0–0.2)

## 2021-09-29 LAB — RESP PANEL BY RT-PCR (FLU A&B, COVID) ARPGX2
Influenza A by PCR: NEGATIVE
Influenza B by PCR: NEGATIVE
SARS Coronavirus 2 by RT PCR: NEGATIVE

## 2021-09-29 LAB — GLUCOSE, CAPILLARY
Glucose-Capillary: 233 mg/dL — ABNORMAL HIGH (ref 70–99)
Glucose-Capillary: 153 mg/dL — ABNORMAL HIGH (ref 70–99)
Glucose-Capillary: 115 mg/dL — ABNORMAL HIGH (ref 70–99)
Glucose-Capillary: 244 mg/dL — ABNORMAL HIGH (ref 70–99)
Glucose-Capillary: 148 mg/dL — ABNORMAL HIGH (ref 70–99)

## 2021-09-29 MED ORDER — INSULIN GLARGINE-YFGN 100 UNIT/ML ~~LOC~~ SOLN: 10.0000 [IU] | Freq: Every day | SUBCUTANEOUS | 0 refills | Status: DC

## 2021-09-29 MED ORDER — METRONIDAZOLE 500 MG PO TABS: 500.0000 mg | ORAL_TABLET | Freq: Three times a day (TID) | ORAL | 0 refills | Status: AC

## 2021-09-29 MED ORDER — ADULT MULTIVITAMIN W/MINERALS CH: 1.0000 | ORAL_TABLET | Freq: Every day | ORAL | Status: DC

## 2021-09-29 MED ORDER — PANTOPRAZOLE SODIUM 40 MG PO TBEC: 40.0000 mg | DELAYED_RELEASE_TABLET | Freq: Two times a day (BID) | ORAL | 0 refills | Status: DC

## 2021-09-29 MED ORDER — INSULIN ASPART 100 UNIT/ML FLEXPEN: 4.0000 [IU] | PEN_INJECTOR | Freq: Three times a day (TID) | SUBCUTANEOUS | 11 refills | Status: DC

## 2021-09-29 MED ORDER — TETRACYCLINE HCL 500 MG PO CAPS: 500.0000 mg | ORAL_CAPSULE | Freq: Three times a day (TID) | ORAL | 0 refills | Status: AC

## 2021-09-29 MED ORDER — SENNOSIDES-DOCUSATE SODIUM 8.6-50 MG PO TABS: 1.0000 | ORAL_TABLET | Freq: Two times a day (BID) | ORAL | Status: DC

## 2021-09-29 MED ORDER — ACETAMINOPHEN 325 MG PO TABS: 650.0000 mg | ORAL_TABLET | Freq: Four times a day (QID) | ORAL | Status: DC | PRN

## 2021-09-29 MED ORDER — STERILE WATER FOR INJECTION IV SOLN
INTRAVENOUS | Status: AC
  Filled 2021-09-29 (×3): qty 1000

## 2021-09-29 MED ORDER — PROSOURCE PLUS PO LIQD: 30.0000 mL | Freq: Two times a day (BID) | ORAL | Status: DC

## 2021-09-29 MED ORDER — ENSURE MAX PROTEIN PO LIQD: 11.0000 [oz_av] | Freq: Two times a day (BID) | ORAL | Status: DC

## 2021-09-29 MED ORDER — BISMUTH SUBSALICYLATE 262 MG PO CHEW: 524.0000 mg | CHEWABLE_TABLET | Freq: Three times a day (TID) | ORAL | 0 refills | Status: AC

## 2021-09-29 MED ORDER — LIDOCAINE HCL URETHRAL/MUCOSAL 2 % EX GEL
1.0000 "application " | Freq: Once | CUTANEOUS | Status: DC
  Filled 2021-09-29: qty 5

## 2021-09-29 NOTE — Discharge Summary (Signed)
Physician Discharge Summary  Adam Chung:[id number] DOB: 24-Jan-1951 DOA: 09/20/2021  PCP: Rusty Aus, MD  Admit date: 09/20/2021 Discharge date: 09/29/2021  Time spent: 60 minutes  Recommendations for Outpatient Follow-up:  [facility] 2 to 3 weeks for voiding trial and further evaluation. Follow-up with MD at SNF.  Patient will need a basic metabolic profile done in 1 week to follow-up on electrolytes and renal function. Follow-up with Dr. Ramon Dredge, ID in 3 weeks or as previously scheduled. Follow-up with Dr. Michail Sermon, gastroenterology in 3 weeks.   Discharge Diagnoses:  Principal Problem:   DKA (diabetic ketoacidosis) ([place]) Active Problems:   Hypertension   AKI (acute kidney injury) ([facility])   Acute urinary retention   Uncontrolled type 2 diabetes mellitus with hyperglycemia (H[facility])   Normocytic anemia   Hyperkalemia   Thrombocytosis   Trigeminy   Hyperphosphatemia   Hypermagnesemia   Pressure injury of skin   Abnormal CT scan, stomach   Malnutrition of moderate degree   Bulbous urethral stricture   Discharge Condition: Stable and improved  Diet recommendation: Carb modified diet  Filed Weights   09/20/21 1007 09/20/21 1700 09/24/21 1327  Weight: 80 kg 71.4 kg 71.4 kg    History of present illness:  HPI per Dr. Evonnie Chung is a 70 y.o. male with medical history significant of diabetes bladder cancer on BCG injections complicated by developing a bladder infection secondary to the BCG vaccine started on INH, rifampin and ethambutol by ID who has been off his medications since 2 weeks ago who was brought to the emergency department via EMS due to altered mental status.  He call some family members family this morning at 49.  His family woke up and saw missed call.  They attempted to call him back, got no answer so they call EMS.  When the paramedic crew got to the house he was on the ground on his knees complaining of pain  in his gluteal area.  Blood glucose on the field was more than 500 mg/dL.  They gave a 500 mL NS bolus.  He is only oriented to person and unable to provide further information.   ED Course: Initial vital signs were temperature 98.7 F, pulse 94, respirations 24, BP 145/68 mmHg O2 sat 100% on room air.  Patient received 3 L of LR bolus and was started on an insulin infusion in the emergency department.   Lab work: Urinalysis showed glucosuria more than 500, ketonuria 5, proteinuria 30 mg/dL.  There was large leukocyte esterase, with RBC more than 50, WBC more than 50 with rare bacteria.  Venous blood gas showed a pH of 7.173, CBC showed a white count of 21.2, hemoglobin 11.5 g/dL platelets 464.  Sodium was 111, potassium 6.5, chloride 78 and CO2 12 mmol/L.  Anion gap was 21.  Glucose 1468, BUN 162 and creatinine 5.50 mg/dL.  Hospital Course:  1.DKA/type 2 diabetes mellitus, poorly controlled -Admitted with DKA with blood sugars of 3716, acute metabolic encephalopathy, concern for UTI.  Urinalysis done with glycosuria, ketonuria. -Hemoglobin A1c 12.5 (09/23/2021) -Patient initially placed on the glucose stabilizer and monitored, and placed in the stepdown unit. -Patient hydrated IV fluids. -Patient placed on antibiotics. -Anion gap closed, DKA resolved and patient transitioned to subcutaneous insulin. -Patient maintained on Semglee 10 units daily, SSI.   -Patient with discharge on Semglee 10 units daily, NovoLog 4 units 3 times daily with meals if patient eats greater than 50% of meals.   -  Outpatient follow-uP.  2.  Gastric fundus with 3 cm mass versus area of focal thickening/H. pylori gastritis -Patient seen in consultation by GI and due to recent weight loss of 4.9 kg/month with recently decreased appetite and recent 9 kg weight loss over the past month GI consulted. -Patient seen by GI underwent upper endoscopy on 09/24/2021 which showed mild diffuse proximal gastric wall thickening with a  mosaic pattern, no mass seen, biopsies taken and positive for H. pylori gastritis.  Patient started on quadruple therapy per GI to treat for total of 10 days due to penicillin allergy.  GI recommending outpatient colonoscopy. -Outpatient follow-up with GI  3.  Acute metabolic encephalopathy -Likely secondary to DKA, metabolic derangements, UTI. -Resolved.  4.  Sepsis due to UTI, POA -Urine cultures with < 10,0000 colonies however was on antibiotics prior to admission. -Patient treated with aztreonam. -Patient remained afebrile throughout the hospitalization.   -Leukocytosis trended down and resolved.  -Now on RIPE -Outpatient follow-up with ID.  5.  Recent bladder infection secondary to BCG on ripe therapy per ID in [place] -Dr. Avon Gully discussed with ID and patient back on RIPE therapy which he is tolerating. -Outpatient follow-up with primary ID doc in [place] on discharge.  6.  Hypertension -Remained stable.  7.  Metabolic acidosis -Likely secondary to CKD in the setting of multiple loose watery stools. -Bicarb tablets ordered however patient noted to be refusing and as such no significant improvement with metabolic acidosis. -Acidosis improved on bicarb drip.   -Outpatient follow-up.  8.  Acute kidney injury on chronic kidney disease stage IV -Baseline creatinine approximately 2.0 08/11/2021 with GFR of 34. -Likely secondary to prerenal azotemia in the setting of volume depletion and DKA. -Creatinine peaked at 5.5 and downtrending with gentle IV fluids. -Creatinine was down to 1.89.   -IV fluids saline lock.   -Outpatient follow-up with PCP.    9.  Anemia of chronic disease -Hemoglobin stable at 9.0.  10.  Hyperkalemia -Resolved.  11.  Thrombocytosis -Secondary to hemoconcentration on admission, resolved.  12.  Trigeminy -2D echo stable with no wall motion abnormalities. -- Patient placed on metoprolol.    13. hyperphosphatemia/hypermagnesemia -Likely due  to hemoconcentration.   -Electrolytes repleted . -Outpatient follow-up.   14.  Moderate protein calorie malnutrition -Patient placed on nutritional supplementation.    15.  Constipation -Patient with bowel movements after being placed on sorbitol and Dulcolax suppository.  -Patient with multiple loose stools early on which have since resolved with Imodium.. -Imodium as needed for loose stools. -MiraLAX was discontinued.  16.  Incomplete emptying of bladder/urinary retention/bulbar urethral stricture -Patient with a history of incomplete bladder emptying, history of bladder cancer with complaints of inability to empty his bladder and having to strain. -Foley catheter attempted by 2 nurses however met resistance and inability to place Foley catheter  -Urology consulted patient seen in consultation by Dr. Tresa Moore and patient underwent bedside cystoscopy 09/29/2021 with several false passages including bladder neck and Foley catheter placement over wire with recommendations to keep Foley catheter in on discharge with outpatient follow-up and consideration for voiding trial in 2 to 3 weeks.   -Patient will follow up with Dr. Collene Mares in 2 to 3 weeks for further evaluation and management.   -Patient also maintained on home regimen Flomax. -  17.  Pressure injury of skin, and coccyx and left knee, POA Pressure Injury 09/20/21 Coccyx Medial Stage 1 -  Intact skin with non-blanchable redness of a localized area usually over  a bony prominence. (Active)  09/20/21 1530  Location: Coccyx  Location Orientation: Medial  Staging: Stage 1 -  Intact skin with non-blanchable redness of a localized area usually over a bony prominence.  Wound Description (Comments):   Present on Admission: Yes     Pressure Injury 09/20/21 Knee Anterior;Left Stage 1 -  Intact skin with non-blanchable redness of a localized area usually over a bony prominence. (Active)  09/20/21 1530  Location: Knee  Location Orientation:  Anterior;Left  Staging: Stage 1 -  Intact skin with non-blanchable redness of a localized area usually over a bony prominence.  Wound Description (Comments):   Present on Admission: Yes           Procedures: Upper endoscopy 09/24/2021 CT abdomen and pelvis 09/20/2021 2D echo 09/21/2021 Bedside cystoscopy with several false passages including bladder neck, Foley catheter placement per urology, Dr. Tresa Moore 09/29/2021  Consultations: Urology: Dr. Tresa Moore 09/29/2021 Gastroenterology: Dr. Michail Sermon 09/23/2021  Discharge Exam: Vitals:   09/29/21 0627 09/29/21 1406  BP: 140/60 132/73  Pulse: 94 87  Resp: 18   Temp: 98.7 F (37.1 C) 98.1 F (36.7 C)  SpO2: 97% 100%    General: NAD Cardiovascular: RRR no murmurs rubs or gallops.  No JVD.  No lower extremity edema. Respiratory: Clear to auscultation bilaterally.  No wheezes, no crackles, no rhonchi.  Discharge Instructions   Discharge Instructions     Diet Carb Modified   Complete by: As directed    Discharge wound care:   Complete by: As directed    As above   Increase activity slowly   Complete by: As directed       Allergies as of 09/29/2021       Reactions   Apple Anaphylaxis   Penicillins Swelling   Swelling of lip per patient        Medication List     STOP taking these medications    ciprofloxacin 500 MG tablet Commonly known as: CIPRO       TAKE these medications    Ensure Max Protein Liqd Take 330 mLs (11 oz total) by mouth 2 (two) times daily.   (feeding supplement) PROSource Plus liquid Take 30 mLs by mouth 2 (two) times daily between meals. Start taking on: September 30, 2021   acetaminophen 325 MG tablet Commonly known as: TYLENOL Take 2 tablets (650 mg total) by mouth every 6 (six) hours as needed for mild pain (or Fever >/= 101).   bismuth subsalicylate 409 MG chewable tablet Commonly known as: PEPTO BISMOL Chew 2 tablets (524 mg total) by mouth 4 (four) times daily -  before meals and  at bedtime for 6 days.   Cholecalciferol 50 MCG (2000 UT) Tabs Take 3,000 Units by mouth daily.   ethambutol 400 MG tablet Commonly known as: MYAMBUTOL Take 3 tablets (1,200 mg total) by mouth daily.   glimepiride 4 MG tablet Commonly known as: AMARYL Take 4 mg by mouth daily with breakfast.   insulin aspart 100 UNIT/ML FlexPen Commonly known as: NOVOLOG Inject 4 Units into the skin 3 (three) times daily with meals. Give only if patient consumes > 50% of meals.   insulin glargine-yfgn 100 UNIT/ML injection Commonly known as: SEMGLEE Inject 0.1 mLs (10 Units total) into the skin daily. Start taking on: September 30, 2021   isoniazid 300 MG tablet Commonly known as: NYDRAZID Take 1 tablet (300 mg total) by mouth daily.   metroNIDAZOLE 500 MG tablet Commonly known as: FLAGYL Take 1 tablet (500  mg total) by mouth 3 (three) times daily for 6 days.   multivitamin with minerals Tabs tablet Take 1 tablet by mouth daily. Start taking on: September 30, 2021   ondansetron 8 MG disintegrating tablet Commonly known as: Zofran ODT Take 1 tablet (8 mg total) by mouth every 8 (eight) hours as needed for nausea or vomiting.   pantoprazole 40 MG tablet Commonly known as: PROTONIX Take 1 tablet (40 mg total) by mouth 2 (two) times daily for 6 days.   pyridOXINE 50 MG tablet Commonly known as: B-6 Take 1 tablet (50 mg total) by mouth daily.   rifampin 300 MG capsule Commonly known as: RIFADIN Take 2 capsules (600 mg total) by mouth daily.   senna-docusate 8.6-50 MG tablet Commonly known as: Senokot-S Take 1 tablet by mouth 2 (two) times daily.   tamsulosin 0.4 MG Caps capsule Commonly known as: FLOMAX Take 0.4 mg by mouth daily.   tetracycline 500 MG capsule Commonly known as: SUMYCIN Take 1 capsule (500 mg total) by mouth 3 (three) times daily for 6 days.               Discharge Care Instructions  (From admission, onward)           Start     Ordered   09/29/21  0000  Discharge wound care:       Comments: As above   09/29/21 2055           Allergies  Allergen Reactions   Apple Anaphylaxis   Penicillins Swelling    Swelling of lip per patient    Contact information for follow-up providers     [facility] Follow up.   Why: Office will call to arrange FU appt in 2-3 weeks. Contact information: [place] [phone]        [facility] AT SNF Follow up.          Tsosie Billing, MD. Schedule an appointment as soon as possible for a visit in 3 week(s).   Specialty: Infectious Diseases Why: Follow-up in 3 weeks or as previously scheduled. Contact information: [place] [address] [phone]         Wilford Corner, MD. Schedule an appointment as soon as possible for a visit in 3 week(s).   Specialty: Gastroenterology Contact information: [address] [place] [address] [phone]              Contact information for after-discharge care     Destination     [facility] .   Service: Skilled Nursing Contact information: [address] [place] [phone]                      The results of significant diagnostics from this hospitalization (including imaging, microbiology, ancillary and laboratory) are listed below for reference.    Significant Diagnostic Studies: CT ABDOMEN PELVIS WO CONTRAST  Result Date: 09/20/2021 CLINICAL DATA:  Weakness and lethargy. Not eating or drinking x2 days. History low-grade bladder cancer treated with TURBT and BCG. 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 EXAM: CT ABDOMEN AND PELVIS WITHOUT CONTRAST TECHNIQUE: Multidetector CT imaging of the abdomen and pelvis was performed following the standard  protocol without IV contrast. COMPARISON:  CT abdomen pelvis 08/09/2021 FINDINGS: Lower chest: No acute abnormality. Hepatobiliary:  A 0.6 cm oval circumscribed hypodensity at the dome of the liver is stable dating back to 07/28/2018, likely benign hepatic cyst (series 2, image 11). Status post cholecystectomy. No biliary dilatation. Pancreas: Unremarkable. No pancreatic ductal dilatation or surrounding inflammatory changes. Spleen: Normal in size without focal abnormality. Adrenals/Urinary Tract: Adrenal glands are unremarkable. Redemonstration of severe bilateral hydroureteronephrosis. No nephrolithiasis or perinephric fat stranding. Previously noted tiny left renal cortical cyst is not appreciated. No new renal mass. Urinary bladder is mildly distended with mild diffuse wall thickening measuring 0.4 cm and mild adjacent fat stranding (series 2, image 70. Stomach/Bowel: Diffuse thickening of the distal esophagus, which can be seen in the setting of reflux. There is a 3 cm area of thickening versus mass in the gastric fundus (series 2, image 14), new compared to CT 07/28/2018. Small bowel feces sign is seen within the distal ileum, consistent with slow transit. No dilatation of the small bowel. Stool is seen within the ascending colon with hard formed stools noted in transverse colon and scattered throughout the descending colon. Scattered diverticula in the sigmoid colon. No inflammatory changes of the bowel. Vascular/Lymphatic: Minimal scattered aortoiliac calcific atherosclerosis. No abdominal aortic aneurysm. No enlarged abdominal or pelvic lymph nodes. The previously noted small perirectal lymph node seen on 08/06/2021 are not appreciated on today's exam. Reproductive: Postoperative changes of resection. Other: Small bilateral fat containing inguinal hernias. Musculoskeletal: Stable sclerotic lesions in the right iliac bone and left acetabulum, unchanged compared to 07/28/2018. multilevel degenerative disc  disease in the lumbar spine, most severe at L1-L2 with vacuum disc phenomenon. No suspicious osseous lesion. IMPRESSION: 1. Diffuse mild wall thickening of the urinary bladder with mild adjacent fat stranding, suggestive of recurrent cystitis. 2. Unchanged bilateral severe hydroureteronephrosis. No nephrolithiasis or perinephric fat stranding. 3. In the gastric fundus, there is a 3 cm mass versus area of focal thickening. Recommend further evaluation with EGD for direct visualization. 4. Hard formed stools in the transverse and descending colon with small bowel feces sign in the distal ileum, indicative of slow transit. No bowel obstruction. Electronically Signed   By: Ileana Roup M.D.   On: 09/20/2021 12:44   ECHOCARDIOGRAM COMPLETE  Result Date: 09/21/2021    ECHOCARDIOGRAM REPORT   Patient Name:   Adam Chung Date of Exam: 09/21/2021 Medical Rec #:  [id number]         Height:       71.0 in Accession #:    [id number]        Weight:       157.4 lb Date of Birth:  Feb 23, 1951         BSA:          1.905 m Patient Age:    21 years          BP:           119/67 mmHg Patient Gender: M                 HR:           96 bpm. Exam Location:  Inpatient Procedure: 2D Echo, Cardiac Doppler, Color Doppler and Intracardiac            Opacification Agent Indications:    Trigeminy [[id number]]  History:        Patient has no prior history of Echocardiogram examinations.                 Risk Factors:Diabetes. Cancer.  Sonographer:    Jonelle Sidle  Burt Knack RDCS Referring Phys: [id number] DAVID MANUEL ORTIZ  Sonographer Comments: Suboptimal parasternal window and suboptimal apical window. IMPRESSIONS  1. Technically difficult echo with poor image quality.  2. Left ventricular ejection fraction, by estimation, is 60 to 65%. The left ventricle has normal function. The left ventricle has no regional wall motion abnormalities. Left ventricular diastolic parameters were normal.  3. Right ventricular systolic function is mildly reduced. The  right ventricular size is normal.  4. The mitral valve is grossly normal. No evidence of mitral valve regurgitation.  5. The aortic valve is normal in structure. Aortic valve regurgitation is not visualized. No aortic stenosis is present. FINDINGS  Left Ventricle: Left ventricular ejection fraction, by estimation, is 60 to 65%. The left ventricle has normal function. The left ventricle has no regional wall motion abnormalities. Definity contrast agent was given IV to delineate the left ventricular  endocardial borders. The left ventricular internal cavity size was normal in size. There is no left ventricular hypertrophy. Left ventricular diastolic parameters were normal. Right Ventricle: The right ventricular size is normal. Right vetricular wall thickness was not well visualized. Right ventricular systolic function is mildly reduced. Left Atrium: Left atrial size was normal in size. Right Atrium: Right atrial size was normal in size. Pericardium: There is no evidence of pericardial effusion. Mitral Valve: The mitral valve is grossly normal. No evidence of mitral valve regurgitation. Tricuspid Valve: The tricuspid valve is not well visualized. Tricuspid valve regurgitation is trivial. Aortic Valve: The aortic valve is normal in structure. Aortic valve regurgitation is not visualized. No aortic stenosis is present. Pulmonic Valve: The pulmonic valve was not well visualized. Pulmonic valve regurgitation is not visualized. Aorta: The aortic root and ascending aorta are structurally normal, with no evidence of dilitation. IAS/Shunts: The atrial septum is grossly normal. Additional Comments: Technically difficult echo with poor image quality.  LEFT VENTRICLE PLAX 2D LVOT diam:     2.10 cm   Diastology LV SV:         59        LV e' medial:    5.19 cm/s LV SV Index:   31        LV E/e' medial:  12.8 LVOT Area:     3.46 cm  LV e' lateral:   7.51 cm/s                          LV E/e' lateral: 8.8  RIGHT VENTRICLE RV S  prime:     11.40 cm/s TAPSE (M-mode): 1.6 cm LEFT ATRIUM             Index LA Vol (A2C):   19.9 ml 10.45 ml/m LA Vol (A4C):   19.6 ml 10.29 ml/m LA Biplane Vol: 20.4 ml 10.71 ml/m  AORTIC VALVE LVOT Vmax:   80.80 cm/s LVOT Vmean:  57.000 cm/s LVOT VTI:    0.170 m  AORTA Ao Root diam: 3.50 cm MITRAL VALVE MV Area (PHT): 3.63 cm    SHUNTS MV Decel Time: 209 msec    Systemic VTI:  0.17 m MV E velocity: 66.40 cm/s  Systemic Diam: 2.10 cm MV A velocity: 93.00 cm/s MV E/A ratio:  0.71 Mertie Moores MD Electronically signed by Mertie Moores MD Signature Date/Time: 09/21/2021/2:08:38 PM    Final     Microbiology: Recent Results (from the past 240 hour(s))  Urine Culture     Status: Abnormal   Collection Time: 09/20/21 10:35 AM  Specimen: Urine, Clean Catch  Result Value Ref Range Status   Specimen Description   Final    URINE, CLEAN CATCH Performed at [facility], [place] [address], [place], [place] [address]    Special Requests   Final    NONE Performed at [facility], [place] [address], [place], [place] [address]    Culture (A)  Final    <10,000 COLONIES/mL INSIGNIFICANT GROWTH Performed at [place] [address] [place], [place] [address]    Report Status 09/21/2021 FINAL  Final  MRSA Next Gen by PCR, Nasal     Status: None   Collection Time: 09/20/21  3:40 PM   Specimen: Nasal Mucosa; Nasal Swab  Result Value Ref Range Status   MRSA by PCR Next Gen NOT DETECTED NOT DETECTED Final    Comment: (NOTE) The GeneXpert MRSA Assay (FDA approved for NASAL specimens only), is one component of a comprehensive MRSA colonization surveillance program. It is not intended to diagnose MRSA infection nor to guide or monitor treatment for MRSA infections. Test performance is not FDA approved in patients less than 77 years old. Performed at [facility], [place] [address] [place], [place] [address]   Resp Panel by RT-PCR (Flu A&B, Covid)  Nasal Mucosa     Status: None   Collection Time: 09/20/21  3:41 PM   Specimen: Nasal Mucosa; Nasopharyngeal(NP) swabs in vial transport medium  Result Value Ref Range Status   SARS Coronavirus 2 by RT PCR NEGATIVE NEGATIVE Final    Comment: (NOTE) SARS-CoV-2 target nucleic acids are NOT DETECTED.  The SARS-CoV-2 RNA is generally detectable in upper respiratory specimens during the acute phase of infection. The lowest concentration of SARS-CoV-2 viral copies this assay can detect is 138 copies/mL. A negative result does not preclude SARS-Cov-2 infection and should not be used as the sole basis for treatment or other patient management decisions. A negative result may occur with  improper specimen collection/handling, submission of specimen other than nasopharyngeal swab, presence of viral mutation(s) within the areas targeted by this assay, and inadequate number of viral copies(<138 copies/mL). A negative result must be combined with clinical observations, patient history, and epidemiological information. The expected result is Negative.  Fact Sheet for Patients:  EntrepreneurPulse.com.au  Fact Sheet for Healthcare Providers:  IncredibleEmployment.be  This test is no t yet approved or cleared by the [place] FDA and  has been authorized for detection and/or diagnosis of SARS-CoV-2 by FDA under an Emergency Use Authorization (EUA). This EUA will remain  in effect (meaning this test can be used) for the duration of the COVID-19 declaration under Section 564(b)(1) of the Act, 21 U.S.C.section 360bbb-3(b)(1), unless the authorization is terminated  or revoked sooner.       Influenza A by PCR NEGATIVE NEGATIVE Final   Influenza B by PCR NEGATIVE NEGATIVE Final    Comment: (NOTE) The Xpert Xpress SARS-CoV-2/FLU/RSV plus assay is intended as an aid in the diagnosis of influenza from Nasopharyngeal swab specimens and should not be used as a  sole basis for treatment. Nasal washings and aspirates are unacceptable for Xpert Xpress SARS-CoV-2/FLU/RSV testing.  Fact Sheet for Patients: EntrepreneurPulse.com.au  Fact Sheet for Healthcare Providers: IncredibleEmployment.be  This test is not yet approved or cleared by the [place] FDA and has been authorized for detection and/or diagnosis of SARS-CoV-2 by FDA under an Emergency Use Authorization (EUA). This EUA will remain in effect (meaning this test can be used)  for the duration of the COVID-19 declaration under Section 564(b)(1) of the Act, 21 U.S.C. section 360bbb-3(b)(1), unless the authorization is terminated or revoked.  Performed at [facility], [place] [address] [place], [place] [address]   Resp Panel by RT-PCR (Flu A&B, Covid) Nasopharyngeal Swab     Status: None   Collection Time: 09/29/21  1:11 PM   Specimen: Nasopharyngeal Swab; Nasopharyngeal(NP) swabs in vial transport medium  Result Value Ref Range Status   SARS Coronavirus 2 by RT PCR NEGATIVE NEGATIVE Final    Comment: (NOTE) SARS-CoV-2 target nucleic acids are NOT DETECTED.  The SARS-CoV-2 RNA is generally detectable in upper respiratory specimens during the acute phase of infection. The lowest concentration of SARS-CoV-2 viral copies this assay can detect is 138 copies/mL. A negative result does not preclude SARS-Cov-2 infection and should not be used as the sole basis for treatment or other patient management decisions. A negative result may occur with  improper specimen collection/handling, submission of specimen other than nasopharyngeal swab, presence of viral mutation(s) within the areas targeted by this assay, and inadequate number of viral copies(<138 copies/mL). A negative result must be combined with clinical observations, patient history, and epidemiological information. The expected result is Negative.  Fact Sheet for Patients:   EntrepreneurPulse.com.au  Fact Sheet for Healthcare Providers:  IncredibleEmployment.be  This test is no t yet approved or cleared by the [place] FDA and  has been authorized for detection and/or diagnosis of SARS-CoV-2 by FDA under an Emergency Use Authorization (EUA). This EUA will remain  in effect (meaning this test can be used) for the duration of the COVID-19 declaration under Section 564(b)(1) of the Act, 21 U.S.C.section 360bbb-3(b)(1), unless the authorization is terminated  or revoked sooner.       Influenza A by PCR NEGATIVE NEGATIVE Final   Influenza B by PCR NEGATIVE NEGATIVE Final    Comment: (NOTE) The Xpert Xpress SARS-CoV-2/FLU/RSV plus assay is intended as an aid in the diagnosis of influenza from Nasopharyngeal swab specimens and should not be used as a sole basis for treatment. Nasal washings and aspirates are unacceptable for Xpert Xpress SARS-CoV-2/FLU/RSV testing.  Fact Sheet for Patients: EntrepreneurPulse.com.au  Fact Sheet for Healthcare Providers: IncredibleEmployment.be  This test is not yet approved or cleared by the [place] FDA and has been authorized for detection and/or diagnosis of SARS-CoV-2 by FDA under an Emergency Use Authorization (EUA). This EUA will remain in effect (meaning this test can be used) for the duration of the COVID-19 declaration under Section 564(b)(1) of the Act, 21 U.S.C. section 360bbb-3(b)(1), unless the authorization is terminated or revoked.  Performed at [facility], [place] [address] [place], [place] [address]      Labs: Basic Metabolic Panel: Recent Labs  Lab 09/25/21 0347 09/26/21 0426 09/27/21 0423 09/28/21 0419 09/29/21 0727  NA 139 136 139 136 136  K 4.8 4.2 4.6 4.6 3.9  CL 111 110 115* 112* 105  CO2 18* 16* 16* 15* 24  GLUCOSE 87 73 77 207* 156*  BUN 70* 69* 58* 50* 41*  CREATININE 2.08* 2.34*  2.03* 2.00* 1.89*  CALCIUM 8.8* 8.5* 8.2* 8.1* 7.6*  MG  --   --  1.7 2.0  --    Liver Function Tests: No results for input(s): AST, ALT, ALKPHOS, BILITOT, PROT, ALBUMIN in the last 168 hours. No results for input(s): LIPASE, AMYLASE in the last 168 hours. No results for input(s): AMMONIA in the last 168 hours. CBC: Recent  Labs  Lab 09/24/21 0418 09/25/21 0347 09/25/21 1438 09/26/21 0426 09/27/21 0423 09/28/21 0419 09/29/21 0727  WBC 7.4 7.9  --  8.7 6.9  --  6.3  HGB 10.1* 9.9* 11.0* 9.8* 9.5* 10.0* 9.0*  HCT 31.3* 30.8* 34.1* 31.2* 30.3* 30.6* 27.5*  MCV 89.7 90.3  --  91.0 91.3  --  89.0  PLT 197 205  --  214 198  --  170   Cardiac Enzymes: No results for input(s): CKTOTAL, CKMB, CKMBINDEX, TROPONINI in the last 168 hours. BNP: BNP (last 3 results) No results for input(s): BNP in the last 8760 hours.  ProBNP (last 3 results) No results for input(s): PROBNP in the last 8760 hours.  CBG: Recent Labs  Lab 09/28/21 1630 09/28/21 2143 09/29/21 0738 09/29/21 1135 09/29/21 1653  GLUCAP 233* 165* 153* 115* 244*       Signed:  Irine Seal MD.  [facility] 09/29/2021, 9:09 PM

## 2021-09-29 NOTE — Progress Notes (Signed)
Attempted to insert 42F Coude catheter in patient.  Unable to advance foley past the level of the prostate.  Pt states that in September the Urologist at [facility] had to place catheter using a cystoscope.  Notified Dr. Grandville Silos and left message for Dr. Tresa Moore to call RN.  Assisted by J. Callendar, Therapist, sports. Andre Lefort

## 2021-09-29 NOTE — Consult Note (Signed)
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 male.   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 Bedside cysto 11/1 with several false passes including bladder neck 6 O-clock (true lumen 12 O clock).   2 - Bladder Cancer - low grade cancer treated in 2015 by Adam Chung and had BCG (not indicated in low grade) for unlcear reasons.   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 AFB negative. Placed on anti mycobacterium regime per ID. Bedside cysto 11/1 with minimal necrotic / obstructing tissue in prostatic fossa / bladder neck.   4 - Acute on Chronic Renal Failure / Chronic Hydronephrosis - Cr 2's x many 2022. Chronic hydro to thickened bladder x many imagign 2022. NO change in GFR with or without bladder drainage.  PMH sig for chole. He is retired [employer]. Has grandaughter doing pre-med. Son is very involved.   Today "Adam Chung" is seen in consultation for above. He in nearing end of hospitalization for DKA and c/o symptoms of retention. Attempt at NSG catheter unsuccsesful.    Past Medical History:  Diagnosis Date   Cancer [facility])    bladder   Diabetes mellitus without complication [facility])     Past Surgical History:  Procedure Laterality Date   BIOPSY  09/24/2021   Procedure: BIOPSY;  Surgeon: Adam Corner, MD;  Location: [facility] ENDOSCOPY;  Service: Endoscopy;;   BLADDER SURGERY     CYSTOSCOPY N/A 08/09/2021   Procedure: CYSTOSCOPY WITH INSERTION OF FOLEY CATHETER;  Surgeon: Adam Lima, MD;  Location: [facility] ORS;  Service: Urology;  Laterality: N/A;   ESOPHAGOGASTRODUODENOSCOPY (EGD) WITH PROPOFOL N/A 09/24/2021   Procedure: ESOPHAGOGASTRODUODENOSCOPY (EGD) WITH PROPOFOL;   Surgeon: Adam Corner, MD;  Location: [facility] ENDOSCOPY;  Service: Endoscopy;  Laterality: N/A;   fatty tumor excision     TRANSURETHRAL RESECTION OF PROSTATE N/A 08/09/2021   Procedure: TRANSURETHRAL RESECTION OF THE PROSTATE (TURP);  Surgeon: Adam Lima, MD;  Location: [facility] ORS;  Service: Urology;  Laterality: N/A;    History reviewed. No pertinent family history.  Social History:  reports that he has quit smoking. He has never used smokeless tobacco. He reports that he does not currently use alcohol. No history on file for drug use.  Allergies:  Allergies  Allergen Reactions   Apple Anaphylaxis   Penicillins Swelling    Swelling of lip per patient    Medications: I have reviewed the patient's current medications.  Results for orders placed or performed during the hospital encounter of 09/20/21 (from the past 48 hour(s))  Glucose, capillary     Status: Abnormal   Collection Time: 09/27/21  9:28 PM  Result Value Ref Range   Glucose-Capillary 231 (H) 70 - 99 mg/dL    Comment: Glucose reference range applies only to samples taken after fasting for at least 8 hours.  Magnesium     Status: None   Collection Time: 09/28/21  4:19 AM  Result Value Ref Range   Magnesium 2.0 1.7 - 2.4 mg/dL    Comment: Performed at [facility], [place] [address] [place], [place] [address]  Basic metabolic panel     Status: Abnormal   Collection Time: 09/28/21  4:19 AM  Result Value Ref Range   Sodium 136 135 - 145 mmol/L   Potassium 4.6 3.5 - 5.1 mmol/L   Chloride 112 (H) 98 - 111 mmol/L   CO2 15 (L) 22 - 32 mmol/L   Glucose, Bld 207 (H) 70 - 99 mg/dL    Comment: Glucose reference range applies only to samples taken after fasting for at least 8 hours.   BUN 50 (H) 8 - 23 mg/dL   Creatinine, Ser 2.00 (H) 0.61 - 1.24 mg/dL   Calcium 8.1 (L) 8.9 - 10.3 mg/dL   GFR, Estimated 35 (L) >60 mL/min    Comment: (NOTE) Calculated using the CKD-EPI Creatinine Equation (2021)    Anion  gap 9 5 - 15    Comment: Performed at [facility], [place] [address] [place], [place] [address]  Hemoglobin and hematocrit, blood     Status: Abnormal   Collection Time: 09/28/21  4:19 AM  Result Value Ref Range   Hemoglobin 10.0 (L) 13.0 - 17.0 g/dL   HCT 30.6 (L) 39.0 - 52.0 %    Comment: Performed at [facility], [place] [address], [place], [place] [address]  Glucose, capillary     Status: Abnormal   Collection Time: 09/28/21  7:22 AM  Result Value Ref Range   Glucose-Capillary 173 (H) 70 - 99 mg/dL    Comment: Glucose reference range applies only to samples taken after fasting for at least 8 hours.  Glucose, capillary     Status: Abnormal   Collection Time: 09/28/21 11:34 AM  Result Value Ref Range   Glucose-Capillary 262 (H) 70 - 99 mg/dL    Comment: Glucose reference range applies only to samples taken after fasting for at least 8 hours.  Glucose, capillary     Status: Abnormal   Collection Time: 09/28/21  4:30 PM  Result Value Ref Range   Glucose-Capillary 233 (H) 70 - 99 mg/dL    Comment: Glucose reference range applies only to samples taken after fasting for at least 8 hours.  Glucose, capillary     Status: Abnormal   Collection Time: 09/28/21  9:43 PM  Result Value Ref Range   Glucose-Capillary 165 (H) 70 - 99 mg/dL    Comment: Glucose reference range applies only to samples taken after fasting for at least 8 hours.  Basic metabolic panel     Status: Abnormal   Collection Time: 09/29/21  7:27 AM  Result Value Ref Range   Sodium 136 135 - 145 mmol/L   Potassium 3.9 3.5 - 5.1 mmol/L   Chloride 105 98 - 111 mmol/L   CO2 24 22 - 32 mmol/L   Glucose, Bld 156 (H) 70 - 99 mg/dL    Comment: Glucose reference range applies only to samples taken after fasting for at least 8 hours.   BUN 41 (H) 8 - 23 mg/dL   Creatinine, Ser 1.89 (H) 0.61 - 1.24 mg/dL   Calcium 7.6 (L) 8.9 - 10.3 mg/dL   GFR, Estimated 38 (L) >60 mL/min    Comment:  (NOTE) Calculated using the CKD-EPI Creatinine Equation (2021)    Anion gap 7 5 - 15    Comment: Performed at [facility], [place] [address], [place],  [address]  CBC     Status: Abnormal   Collection Time: 09/29/21  7:27 AM  Result Value Ref Range   WBC 6.3 4.0 - 10.5 K/uL   RBC 3.09 (L) 4.22 - 5.81 MIL/uL   Hemoglobin  9.0 (L) 13.0 - 17.0 g/dL   HCT 27.5 (L) 39.0 - 52.0 %   MCV 89.0 80.0 - 100.0 fL   MCH 29.1 26.0 - 34.0 pg   MCHC 32.7 30.0 - 36.0 g/dL   RDW 14.5 11.5 - 15.5 %   Platelets 170 150 - 400 K/uL   nRBC 0.0 0.0 - 0.2 %    Comment: Performed at [facility], [place] [address], [place], [place] [address]  Glucose, capillary     Status: Abnormal   Collection Time: 09/29/21  7:38 AM  Result Value Ref Range   Glucose-Capillary 153 (H) 70 - 99 mg/dL    Comment: Glucose reference range applies only to samples taken after fasting for at least 8 hours.  Glucose, capillary     Status: Abnormal   Collection Time: 09/29/21 11:35 AM  Result Value Ref Range   Glucose-Capillary 115 (H) 70 - 99 mg/dL    Comment: Glucose reference range applies only to samples taken after fasting for at least 8 hours.  Resp Panel by RT-PCR (Flu A&B, Covid) Nasopharyngeal Swab     Status: None   Collection Time: 09/29/21  1:11 PM   Specimen: Nasopharyngeal Swab; Nasopharyngeal(NP) swabs in vial transport medium  Result Value Ref Range   SARS Coronavirus 2 by RT PCR NEGATIVE NEGATIVE    Comment: (NOTE) SARS-CoV-2 target nucleic acids are NOT DETECTED.  The SARS-CoV-2 RNA is generally detectable in upper respiratory specimens during the acute phase of infection. The lowest concentration of SARS-CoV-2 viral copies this assay can detect is 138 copies/mL. A negative result does not preclude SARS-Cov-2 infection and should not be used as the sole basis for treatment or other patient management decisions. A negative result may occur with  improper specimen  collection/handling, submission of specimen other than nasopharyngeal swab, presence of viral mutation(s) within the areas targeted by this assay, and inadequate number of viral copies(<138 copies/mL). A negative result must be combined with clinical observations, patient history, and epidemiological information. The expected result is Negative.  Fact Sheet for Patients:  EntrepreneurPulse.com.au  Fact Sheet for Healthcare Providers:  IncredibleEmployment.be  This test is no t yet approved or cleared by the [place] FDA and  has been authorized for detection and/or diagnosis of SARS-CoV-2 by FDA under an Emergency Use Authorization (EUA). This EUA will remain  in effect (meaning this test can be used) for the duration of the COVID-19 declaration under Section 564(b)(1) of the Act, 21 U.S.C.section 360bbb-3(b)(1), unless the authorization is terminated  or revoked sooner.       Influenza A by PCR NEGATIVE NEGATIVE   Influenza B by PCR NEGATIVE NEGATIVE    Comment: (NOTE) The Xpert Xpress SARS-CoV-2/FLU/RSV plus assay is intended as an aid in the diagnosis of influenza from Nasopharyngeal swab specimens and should not be used as a sole basis for treatment. Nasal washings and aspirates are unacceptable for Xpert Xpress SARS-CoV-2/FLU/RSV testing.  Fact Sheet for Patients: EntrepreneurPulse.com.au  Fact Sheet for Healthcare Providers: IncredibleEmployment.be  This test is not yet approved or cleared by the [place] FDA and has been authorized for detection and/or diagnosis of SARS-CoV-2 by FDA under an Emergency Use Authorization (EUA). This EUA will remain in effect (meaning this test can be used) for the duration of the COVID-19 declaration under Section 564(b)(1) of the Act, 21 U.S.C. section 360bbb-3(b)(1), unless the authorization is terminated or revoked.  Performed at [facility], [address] [place], [place]  [address]   Glucose, capillary     Status: Abnormal   Collection Time: 09/29/21  4:53 PM  Result Value Ref Range   Glucose-Capillary 244 (H) 70 - 99 mg/dL    Comment: Glucose reference range applies only to samples taken after fasting for at least 8 hours.    No results found.  Review of Systems  Constitutional:  Negative for chills and fever.  Genitourinary:  Positive for difficulty urinating and urgency.  All other systems reviewed and are negative. Blood pressure 132/73, pulse 87, temperature 98.1 F (36.7 C), temperature source Oral, resp. rate 18, height 5\' 11"  (1.803 m), weight 71.4 kg, SpO2 100 %. Physical Exam Vitals reviewed.  Constitutional:      Comments: Initially in visible pain from retention. Son at bedside. Both very pleasant.   Eyes:     Pupils: Pupils are equal, round, and reactive to light.  Cardiovascular:     Rate and Rhythm: Tachycardia present.  Pulmonary:     Effort: Pulmonary effort is normal.  Abdominal:     General: Abdomen is flat.  Genitourinary:    Comments: Palpable tender / distended bladder.  Musculoskeletal:        General: Normal range of motion.     Cervical back: Normal range of motion.  Skin:    General: Skin is warm.  Neurological:     General: No focal deficit present.     Mental Status: He is alert.  Psychiatric:        Mood and Affect: Mood normal.   BEDSIDE CYSTO / CATHETER PLACEMENT: Penis prepped with iodine and flexible urethorscopy formed with 7F flexible scope. Anterior urethra normal. Bulbar are with small 6 o clock false pass. Large false pass / cavity at 6 o closck prostatic uretrha / bladder neck area. True lumen at 12 o clock navigated to bladder. Minmial obstrucing tissue / nerotic debris. Bladder ful. Marland Kitchen038 sensor wire placed under direct vision and 7F council placed over wire with immediate efflux 900cc concentrated urine that is non-foul. Pt with dramatic pain relief.    Assessment/Plan:  1 - Urinary Retention / Bulbar Urethral Stricture - foely palced today over wire, certainly necessary for cysto placement. Minimal obsructing tissue, but very tortuous true lumen. Keep current foley at DC. We will request outpateint FU and consider voiding trial in 2-3 weeks.   2 - Bladder Cancer - no recurrence.   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 Appears to be improving clinically on current ABX, appreciate ID comanagement. Unclear if true active BCG v. Delayed granulomatous reaction, either way, improving. It is unfortunate he was ever exposed to BCG given lack of compelling indication per history.   4 - Acute on Chronic Renal Failure / Chronic Hydronephrosis - Likely 2/2 bladder neck inflammation that is improving as per above.  We will arrange GU follow up at [facility] office in abtou 2 weeks. Please call with questions.   Alexis Frock 09/29/2021, 5:36 PM

## 2021-09-29 NOTE — Progress Notes (Signed)
PROGRESS NOTE    Adam Chung  JJH:[id number] DOB: October 23, 1951 DOA: 09/20/2021 PCP: Rusty Aus, MD   No chief complaint on file.   Brief Narrative:  Adam Chung, 70 y.o. male with PMH of CKD3a, DM, bladder cancer on BCG injection complicated by developing a bladder infection secondary to BCG started on RIPE therapy by ID in [place] brought to the ED for evaluation of confusion.  Family reports patient has not been compliant with his medication and has been having nausea and confusion and attributing this to continue treatment RIP.  Family was concerned that he was not answering his phone, EMS was contacted and found him on the ground on his knees complaining of pain in his gluteal area blood glucose in the field was more than 500. Seen in the ED found to be in DKA due to 8185, acute metabolic encephalopathy concerning for UTI and admitted.Anion gap has closed, DKA resolved patient transition to subcu insulin.  Normally he is on only oral medication at home.   Assessment & Plan:   Principal Problem:   DKA (diabetic ketoacidosis) ([place]) Active Problems:   Hypertension   AKI (acute kidney injury) ([place])   Uncontrolled type 2 diabetes mellitus with hyperglycemia (H[facility])   Normocytic anemia   Hyperkalemia   Thrombocytosis   Trigeminy   Hyperphosphatemia   Hypermagnesemia   Pressure injury of skin   Abnormal CT scan, stomach   Malnutrition of moderate degree  1.DKA/type 2 diabetes mellitus, poorly controlled -Admitted with DKA with blood sugars of 6314, acute metabolic encephalopathy, concern for UTI.  Urinalysis done with glycosuria, ketonuria. -Hemoglobin A1c 12.5 (09/23/2021) -Patient initially placed on the glucose stabilizer and monitored, and placed in the stepdown unit. -Patient hydrated IV fluids. -Patient placed on antibiotics. -Anion gap closed, DKA resolved and patient transitioned to subcutaneous insulin. -CBG 153 the morning of 09/26/2021 however patient  remained asymptomatic and Semglee decreased to 10 units daily.   -Continue Semglee 10 units daily, SSI.  -We will need insulin on discharge in addition to home regimen of Amaryl. -Diabetic coordinator following.  2.  Gastric fundus with 3 cm mass versus area of focal thickening/H. pylori gastritis -Patient seen in consultation by GI and due to recent weight loss of 4.9 kg/month with recently decreased appetite and recent 9 kg weight loss over the past month GI consulted. -Patient seen by GI underwent upper endoscopy on 09/24/2021 which showed mild diffuse proximal gastric wall thickening with a mosaic pattern, no mass seen, biopsies taken and positive for H. pylori gastritis.  Patient started on quadruple therapy per GI to treat for total of 10 days.  GI recommending outpatient colonoscopy unless rectal bleeding recurs and persist.  -Continue current soft diet. -Per GI.Marland Kitchen   3.  Acute metabolic encephalopathy -Likely secondary to DKA, metabolic derangements, UTI. -Resolved. -Likely at baseline  4.  Sepsis due to UTI, POA -Urine cultures with < 10,0000 colonies however was on antibiotics prior to admission. -Patient treated with aztreonam. -Currently afebrile. -Leukocytosis trended down and resolved. -Now on RIPE -Outpatient follow-up with ID.  5.  Recent bladder infection secondary to BCG on ripe therapy per ID in [place] -Dr. Avon Gully discussed with ID and patient back on RIPE therapy which he is tolerating. -Outpatient follow-up with primary ID doc in [place] on discharge.  6.  Hypertension -Stable.  7.  Metabolic acidosis -Likely secondary to CKD in the setting of multiple loose watery stools. -Bicarb tablets ordered however patient noted to be refusing and  as such no significant improvement with metabolic acidosis. -Acidosis improved on bicarb drip.   -Discontinue bicarb drip this evening.   -Follow.  8.  Acute kidney injury on chronic kidney disease stage IV -Baseline  creatinine approximately 2.0 08/11/2021 with GFR of 34. -Likely secondary to prerenal azotemia in the setting of volume depletion and DKA. -Creatinine peaked at 5.5 and downtrending with gentle IV fluids. -Creatinine currently at 1.89.  -Saline lock IV fluids.  -Avoid nephrotoxic agents. -Outpatient follow-up with PCP.  9.  Anemia of chronic disease -Hemoglobin stable at 9.0.  10.  Hyperkalemia -Resolved. -Potassium of 3.9.  11.  Thrombocytosis -Secondary to hemoconcentration on admission, resolved.  12.  Trigeminy -2D echo stable with no wall motion abnormalities. -- Metoprolol.    13. hyperphosphatemia/hypermagnesemia -Likely due to hemoconcentration.  Magnesium of 2 -Repeat labs in the morning   14.  Moderate protein calorie malnutrition -Continue Nutritional supplementation.  15.  Constipation -Patient with bowel movements after being placed on sorbitol and Dulcolax suppository.  -Patient with multiple loose stools early on which have since resolved with Imodium.. -Imodium as needed for loose stools.  16.  Incomplete emptying of bladder/urinary retention -Patient with a history of incomplete bladder emptying, history of bladder cancer with complaints of inability to empty his bladder and having to strain. -Place Foley catheter and if unable to will have urology assist. -We will need outpatient follow-up with primary urologist. -Continue Flomax. -Discussed with urology. -  17.  Pressure injury of skin, and coccyx and left knee, POA Pressure Injury 09/20/21 Coccyx Medial Stage 1 -  Intact skin with non-blanchable redness of a localized area usually over a bony prominence. (Active)  09/20/21 1530  Location: Coccyx  Location Orientation: Medial  Staging: Stage 1 -  Intact skin with non-blanchable redness of a localized area usually over a bony prominence.  Wound Description (Comments):   Present on Admission: Yes     Pressure Injury 09/20/21 Knee Anterior;Left Stage  1 -  Intact skin with non-blanchable redness of a localized area usually over a bony prominence. (Active)  09/20/21 1530  Location: Knee  Location Orientation: Anterior;Left  Staging: Stage 1 -  Intact skin with non-blanchable redness of a localized area usually over a bony prominence.  Wound Description (Comments):   Present on Admission: Yes         DVT prophylaxis: Heparin Code Status: Full Family Communication: Updated patient, no family at bedside. Disposition:   Status is: Inpatient  Remains inpatient appropriate because: Ongoing management with GI evaluation pending for EGD today, management of uncontrolled diabetes.       Consultants:  Gastroenterology: Dr. Michail Sermon 09/23/2021  Procedures:  Upper endoscopy 09/24/2021 CT abdomen and pelvis 09/20/2021 2D echo 09/21/2021     Antimicrobials:  Ethambutol 09/21/2021>>>>> Isoniazid 09/21/2021>>>>> Rifampin 09/21/2021>>>> Aztreonam 09/20/2021>>>> 09/21/2021 Flagyl 09/25/2021>>>>> 10/05/2021    Subjective: Patient laying in bed.  Patient with some complaints of incomplete emptying of his bladder, having to strain when urinating.  No chest pain.  No shortness of breath.  No abdominal pain.  No further diarrhea.   Objective: Vitals:   09/28/21 0556 09/28/21 1401 09/28/21 2032 09/29/21 0627  BP: (!) 147/68 128/66 135/74 140/60  Pulse: 92 88 93 94  Resp: 18 18 18 18   Temp: 97.7 F (36.5 C) 98.3 F (36.8 C) 98.3 F (36.8 C) 98.7 F (37.1 C)  TempSrc: Oral Oral Oral Oral  SpO2: 100% 100% 100% 97%  Weight:      Height:  Intake/Output Summary (Last 24 hours) at 09/29/2021 1134 Last data filed at 09/29/2021 1000 Gross per 24 hour  Intake 2677.3 ml  Output 2950 ml  Net -272.7 ml    Filed Weights   09/20/21 1007 09/20/21 1700 09/24/21 1327  Weight: 80 kg 71.4 kg 71.4 kg    Examination:  General exam: : NAD Respiratory system: CTA B.  No wheezes, no rhonchi.  Speaking in full sentences.  Normal  respiratory effort. Cardiovascular system: Regular rate and rhythm no murmurs rubs or gallops.  No JVD.  No lower extremity edema.  Gastrointestinal system: Abdomen soft, nontender, nondistended, positive bowel sounds.  No rebound.  No guarding. Central nervous system: Alert and oriented. No focal neurological deficits. Extremities: Symmetric 5 x 5 power. Skin: No rashes, lesions or ulcers Psychiatry: Judgement and insight appear normal. Mood & affect appropriate.  Data Reviewed: I have personally reviewed following labs and imaging studies  CBC: Recent Labs  Lab 09/24/21 0418 09/25/21 0347 09/25/21 1438 09/26/21 0426 09/27/21 0423 09/28/21 0419 09/29/21 0727  WBC 7.4 7.9  --  8.7 6.9  --  6.3  HGB 10.1* 9.9* 11.0* 9.8* 9.5* 10.0* 9.0*  HCT 31.3* 30.8* 34.1* 31.2* 30.3* 30.6* 27.5*  MCV 89.7 90.3  --  91.0 91.3  --  89.0  PLT 197 205  --  214 198  --  170     Basic Metabolic Panel: Recent Labs  Lab 09/25/21 0347 09/26/21 0426 09/27/21 0423 09/28/21 0419 09/29/21 0727  NA 139 136 139 136 136  K 4.8 4.2 4.6 4.6 3.9  CL 111 110 115* 112* 105  CO2 18* 16* 16* 15* 24  GLUCOSE 87 73 77 207* 156*  BUN 70* 69* 58* 50* 41*  CREATININE 2.08* 2.34* 2.03* 2.00* 1.89*  CALCIUM 8.8* 8.5* 8.2* 8.1* 7.6*  MG  --   --  1.7 2.0  --      GFR: Estimated Creatinine Clearance: 36.7 mL/min (A) (by C-G formula based on SCr of 1.89 mg/dL (H)).  Liver Function Tests: No results for input(s): AST, ALT, ALKPHOS, BILITOT, PROT, ALBUMIN in the last 168 hours.  CBG: Recent Labs  Lab 09/27/21 2128 09/28/21 0722 09/28/21 1134 09/28/21 2143 09/29/21 0738  GLUCAP 231* 173* 262* 165* 153*      Recent Results (from the past 240 hour(s))  Urine Culture     Status: Abnormal   Collection Time: 09/20/21 10:35 AM   Specimen: Urine, Clean Catch  Result Value Ref Range Status   Specimen Description   Final    URINE, CLEAN CATCH Performed at [facility], [place]  [address] [place], [place] [address]    Special Requests   Final    NONE Performed at [facility], [place] [address] [place], [place] [address]    Culture (A)  Final    <10,000 COLONIES/mL INSIGNIFICANT GROWTH Performed at [place] [address] [place], [place] [address]    Report Status 09/21/2021 FINAL  Final  MRSA Next Gen by PCR, Nasal     Status: None   Collection Time: 09/20/21  3:40 PM   Specimen: Nasal Mucosa; Nasal Swab  Result Value Ref Range Status   MRSA by PCR Next Gen NOT DETECTED NOT DETECTED Final    Comment: (NOTE) The GeneXpert MRSA Assay (FDA approved for NASAL specimens only), is one component of a comprehensive MRSA colonization surveillance program. It is not intended to diagnose MRSA infection nor to guide or monitor  treatment for MRSA infections. Test performance is not FDA approved in patients less than 88 years old. Performed at [facility], [place] [address] [place], [place] [address]   Resp Panel by RT-PCR (Flu A&B, Covid) Nasal Mucosa     Status: None   Collection Time: 09/20/21  3:41 PM   Specimen: Nasal Mucosa; Nasopharyngeal(NP) swabs in vial transport medium  Result Value Ref Range Status   SARS Coronavirus 2 by RT PCR NEGATIVE NEGATIVE Final    Comment: (NOTE) SARS-CoV-2 target nucleic acids are NOT DETECTED.  The SARS-CoV-2 RNA is generally detectable in upper respiratory specimens during the acute phase of infection. The lowest concentration of SARS-CoV-2 viral copies this assay can detect is 138 copies/mL. A negative result does not preclude SARS-Cov-2 infection and should not be used as the sole basis for treatment or other patient management decisions. A negative result may occur with  improper specimen collection/handling, submission of specimen other than nasopharyngeal swab, presence of viral mutation(s) within the areas targeted by this assay, and inadequate number of  viral copies(<138 copies/mL). A negative result must be combined with clinical observations, patient history, and epidemiological information. The expected result is Negative.  Fact Sheet for Patients:  EntrepreneurPulse.com.au  Fact Sheet for Healthcare Providers:  IncredibleEmployment.be  This test is no t yet approved or cleared by the [place] FDA and  has been authorized for detection and/or diagnosis of SARS-CoV-2 by FDA under an Emergency Use Authorization (EUA). This EUA will remain  in effect (meaning this test can be used) for the duration of the COVID-19 declaration under Section 564(b)(1) of the Act, 21 U.S.C.section 360bbb-3(b)(1), unless the authorization is terminated  or revoked sooner.       Influenza A by PCR NEGATIVE NEGATIVE Final   Influenza B by PCR NEGATIVE NEGATIVE Final    Comment: (NOTE) The Xpert Xpress SARS-CoV-2/FLU/RSV plus assay is intended as an aid in the diagnosis of influenza from Nasopharyngeal swab specimens and should not be used as a sole basis for treatment. Nasal washings and aspirates are unacceptable for Xpert Xpress SARS-CoV-2/FLU/RSV testing.  Fact Sheet for Patients: EntrepreneurPulse.com.au  Fact Sheet for Healthcare Providers: IncredibleEmployment.be  This test is not yet approved or cleared by the [place] FDA and has been authorized for detection and/or diagnosis of SARS-CoV-2 by FDA under an Emergency Use Authorization (EUA). This EUA will remain in effect (meaning this test can be used) for the duration of the COVID-19 declaration under Section 564(b)(1) of the Act, 21 U.S.C. section 360bbb-3(b)(1), unless the authorization is terminated or revoked.  Performed at [facility], [place] [address], [place], [place] [address]           Radiology Studies: No results found.      Scheduled Meds:  (feeding supplement)  PROSource Plus  30 mL Oral BID BM   bismuth subsalicylate  098 mg Oral TID AC & HS   Chlorhexidine Gluconate Cloth  6 each Topical Q0600   ethambutol  1,200 mg Oral Daily   heparin injection (subcutaneous)  5,000 Units Subcutaneous Q8H   insulin aspart  0-15 Units Subcutaneous TID WC   insulin glargine-yfgn  10 Units Subcutaneous Daily   isoniazid  300 mg Oral Daily   mouth rinse  15 mL Mouth Rinse BID   metroNIDAZOLE  500 mg Oral TID   multivitamin with minerals  1 tablet Oral Daily   pantoprazole  40 mg Oral BID   Ensure Max Protein  11  oz Oral BID   pyridOXINE  50 mg Oral Daily   rifampin  600 mg Oral Daily   senna-docusate  1 tablet Oral BID   sodium bicarbonate  1,300 mg Oral TID   tamsulosin  0.4 mg Oral Daily   tetracycline  500 mg Oral TID   Continuous Infusions:   sodium bicarbonate (isotonic) infusion in sterile water 125 mL/hr at 09/28/21 1110     LOS: 9 days    Time spent: 35 minutes    Irine Seal, MD [facility]   To contact the attending provider between 7A-7P or the covering provider during after hours 7P-7A, please log into the web site [email or web address] and access using universal [place] password for that web site. If you do not have the password, please call the hospital operator.  09/29/2021, 11:34 AM

## 2021-09-29 NOTE — Progress Notes (Signed)
Inpatient Diabetes Program Recommendations  AACE/ADA: New Consensus Statement on Inpatient Glycemic Control (2015)  Target Ranges:  Prepandial:   less than 140 mg/dL      Peak postprandial:   less than 180 mg/dL (1-2 hours)      Critically ill patients:  140 - 180 mg/dL   Lab Results  Component Value Date   GLUCAP 153 (H) 09/29/2021   HGBA1C 12.5 (H) 09/23/2021    Review of Glycemic Control  Diabetes history: DM2 Outpatient Diabetes medications: Amaryl 4 mg QAM Current orders for Inpatient glycemic control: Novolog 0-15 units TID with meals, Semglee 10 units QD  HgbA1C - 12.5% - up from 8.3% on 08/07/21  Inpatient Diabetes Program Recommendations:    For discharge:  Semglee 10 units QD Novolog 4 units TID with meals (don't give if not eating) Amaryl 4 mg QAM  Pt states he knows how to give insulin - has given injections before. Reviewed contents of insulin flexpen starter kit. Reviewed all steps if insulin pen including attachment of needle, 2-unit air shot, dialing up dose, giving injection, removing needle, disposal of sharps, storage of unused insulin, disposal of insulin etc.  Will continue to follow.  Thank you. Rhonda Schank, RD, LDN, CDE Inpatient Diabetes Coordinator [phone]        

## 2021-09-29 NOTE — Progress Notes (Signed)
Occupational Therapy Treatment Patient Details Name: Adam Chung MRN: [id number] DOB: 1951-03-13 Today's Date: 09/29/2021   History of present illness [name], 70 y.o. male with PMH of CKD3a, DM, bladder cancer,a bladder infection ,brought to the ED for evaluation of confusion, metabolic encephalopathy, DKA.   OT comments  Patient was noted to have participated in toileting tasks with continued assistance required for fatigue with tasks. Patient remains not at baseline and would continue to benefit from continued therapy to increase independence and safety with ADLs. Patient's discharge plan remains appropriate at this time. OT will continue to follow acutely.     Recommendations for follow up therapy are one component of a multi-disciplinary discharge planning process, led by the attending physician.  Recommendations may be updated based on patient status, additional functional criteria and insurance authorization.    Follow Up Recommendations  Skilled nursing-short term rehab (<3 hours/day)    Assistance Recommended at Discharge    Equipment Recommendations  Other (comment) (defer to next venue)    Recommendations for Other Services      Precautions / Restrictions Precautions Precautions: Fall Restrictions Weight Bearing Restrictions: No       Mobility Bed Mobility Overal bed mobility: Needs Assistance   Rolling: Min guard Sidelying to sit: Min guard Supine to sit: Min guard          Transfers                         Balance Overall balance assessment: Needs assistance Sitting-balance support: No upper extremity supported;Feet supported Sitting balance-Leahy Scale: Good     Standing balance support: Bilateral upper extremity supported Standing balance-Leahy Scale: Fair                             ADL either performed or assessed with clinical judgement   ADL Overall ADL's : Needs assistance/impaired                      Lower Body Dressing: Sit to/from stand;Minimal assistance   Toilet Transfer: Nature conservation officer;Ambulation;Grab bars Toilet Transfer Details (indicate cue type and reason): with increased time Toileting- Clothing Manipulation and Hygiene: Minimal assistance;Sit to/from stand Toileting - Clothing Manipulation Details (indicate cue type and reason): with increased assistance for looser BM     Functional mobility during ADLs: Minimal assistance;Rolling walker (2 wheels)       Vision Patient Visual Report: No change from baseline     Perception     Praxis      Cognition Arousal/Alertness: Awake/alert Behavior During Therapy: WFL for tasks assessed/performed Overall Cognitive Status: Within Functional Limits for tasks assessed                           Safety/Judgement: Decreased awareness of safety;Decreased awareness of deficits                Exercises     Shoulder Instructions       General Comments      Pertinent Vitals/ Pain       Faces Pain Scale: Hurts a little bit Pain Location: abdomen 2* diarrhea Pain Descriptors / Indicators: Aching  Home Living  Prior Functioning/Environment              Frequency  Min 2X/week        Progress Toward Goals  OT Goals(current goals can now be found in the care plan section)  Progress towards OT goals: Progressing toward goals     Plan Discharge plan remains appropriate    Co-evaluation                 AM-PAC OT "6 Clicks" Daily Activity     Outcome Measure   Help from another person eating meals?: None Help from another person taking care of personal grooming?: A Little Help from another person toileting, which includes using toliet, bedpan, or urinal?: A Little Help from another person bathing (including washing, rinsing, drying)?: A Lot Help from another person to put on and taking off regular upper body  clothing?: A Little Help from another person to put on and taking off regular lower body clothing?: A Little 6 Click Score: 18    End of Session Equipment Utilized During Treatment: Gait belt;Rolling walker (2 wheels)  OT Visit Diagnosis: Unsteadiness on feet (R26.81);Muscle weakness (generalized) (M62.81)   Activity Tolerance Patient tolerated treatment well   Patient Left with call bell/phone within reach;in bed;with family/visitor present   Nurse Communication Mobility status        Time: 0630-1601 OT Time Calculation (min): 15 min  Charges: OT General Charges $OT Visit: 1 Visit OT Treatments $Self Care/Home Management : 8-22 mins  Jackelyn Poling OTR/L, [facility] Office# [phone] Pager# [phone]   [place] 09/29/2021, 3:32 PM

## 2021-09-29 NOTE — Progress Notes (Signed)
Nurse attempted to insert size 16Fr. Foley catheter.  Nurse met resistance and could not advance catheter to optimal point.  Pt noted to be in a lot of pain with insertion.  Dr. Grand[place] Silos notified and would like to try a coude catheter on pt.  Nurse called 4th floor CN for assistance.  Will attempt a coude catheter when available.

## 2021-09-30 DIAGNOSIS — E1110 Type 2 diabetes mellitus with ketoacidosis without coma: Secondary | ICD-10-CM | POA: Diagnosis not present

## 2021-09-30 LAB — CBC
HCT: 26.9 % — ABNORMAL LOW (ref 39.0–52.0)
Hemoglobin: 8.7 g/dL — ABNORMAL LOW (ref 13.0–17.0)
MCH: 29.4 pg (ref 26.0–34.0)
MCHC: 32.3 g/dL (ref 30.0–36.0)
MCV: 90.9 fL (ref 80.0–100.0)
Platelets: 147 10*3/uL — ABNORMAL LOW (ref 150–400)
RBC: 2.96 MIL/uL — ABNORMAL LOW (ref 4.22–5.81)
RDW: 14.3 % (ref 11.5–15.5)
WBC: 5 10*3/uL (ref 4.0–10.5)
nRBC: 0 % (ref 0.0–0.2)

## 2021-09-30 LAB — MAGNESIUM: Magnesium: 1.5 mg/dL — ABNORMAL LOW (ref 1.7–2.4)

## 2021-09-30 LAB — RENAL FUNCTION PANEL
Albumin: 2.1 g/dL — ABNORMAL LOW (ref 3.5–5.0)
Anion gap: 8 (ref 5–15)
BUN: 33 mg/dL — ABNORMAL HIGH (ref 8–23)
CO2: 23 mmol/L (ref 22–32)
Calcium: 7.4 mg/dL — ABNORMAL LOW (ref 8.9–10.3)
Chloride: 102 mmol/L (ref 98–111)
Creatinine, Ser: 1.89 mg/dL — ABNORMAL HIGH (ref 0.61–1.24)
GFR, Estimated: 38 mL/min — ABNORMAL LOW (ref >60)
Glucose, Bld: 127 mg/dL — ABNORMAL HIGH (ref 70–99)
Phosphorus: 3.5 mg/dL (ref 2.5–4.6)
Potassium: 3.5 mmol/L (ref 3.5–5.1)
Sodium: 133 mmol/L — ABNORMAL LOW (ref 135–145)

## 2021-09-30 LAB — GLUCOSE, CAPILLARY: Glucose-Capillary: 116 mg/dL — ABNORMAL HIGH (ref 70–99)

## 2021-09-30 NOTE — Progress Notes (Signed)
Patient report was attempted numerous times to [facility] [phone]. I have spoke to Willey and left her a call back number for receiving RN to reach me for report.

## 2021-09-30 NOTE — Discharge Summary (Signed)
Physician Discharge Summary  Adam Chung JGG:[id number] DOB: Aug 09, 1951 DOA: 09/20/2021  PCP: Rusty Aus, MD  Admit date: 09/20/2021 Discharge date: 09/30/2021  Time spent: 60 minutes  Recommendations for Outpatient Follow-up:  [facility] 2 to 3 weeks for voiding trial and further evaluation. Follow-up with MD at SNF.  Patient will need a basic metabolic profile done in 1 week to follow-up on electrolytes and renal function. Follow-up with Dr. Ramon Dredge, ID in 3 weeks or as previously scheduled. Follow-up with Dr. Michail Sermon, gastroenterology in 3 weeks.   No acute issues or events overnight - patient otherwise stable and agreeable for discharge to SNF as previously planned.  Discharge Condition: Stable and improved  Diet recommendation: Carb modified diet  Filed Weights   09/20/21 1007 09/20/21 1700 09/24/21 1327  Weight: 80 kg 71.4 kg 71.4 kg    History of present illness:  HPI per Dr. [name] is a 70 y.o. male with medical history significant of diabetes bladder cancer on BCG injections complicated by developing a bladder infection secondary to the BCG vaccine started on INH, rifampin and ethambutol by ID who has been off his medications since 2 weeks ago who was brought to the emergency department via EMS due to altered mental status.  He call some family members family this morning at 85.  His family woke up and saw missed call.  They attempted to call him back, got no answer so they call EMS.  When the paramedic crew got to the house he was on the ground on his knees complaining of pain in his gluteal area.  Blood glucose on the field was more than 500 mg/dL.    Hospital Course:  1.DKA/type 2 diabetes mellitus, poorly controlled -Admitted with DKA with blood sugars of 5465, acute metabolic encephalopathy, concern for UTI.  Urinalysis done with glycosuria, ketonuria. -Hemoglobin A1c 12.5 (09/23/2021) -Patient initially placed  on the glucose stabilizer and monitored, and placed in the stepdown unit. -Patient hydrated IV fluids. -Patient placed on antibiotics. -Anion gap closed, DKA resolved and patient transitioned to subcutaneous insulin. -Patient maintained on Semglee 10 units daily, SSI.   -Patient with discharge on Semglee 10 units daily, NovoLog 4 units 3 times daily with meals if patient eats greater than 50% of meals.   -Outpatient follow-uP.  2.  Gastric fundus with 3 cm mass versus area of focal thickening/H. pylori gastritis -Patient seen in consultation by GI and due to recent weight loss of 4.9 kg/month with recently decreased appetite and recent 9 kg weight loss over the past month GI consulted. -Patient seen by GI underwent upper endoscopy on 09/24/2021 which showed mild diffuse proximal gastric wall thickening with a mosaic pattern, no mass seen, biopsies taken and positive for H. pylori gastritis.  Patient started on quadruple therapy per GI to treat for total of 10 days due to penicillin allergy.  GI recommending outpatient colonoscopy. -Outpatient follow-up with GI  3.  Acute metabolic encephalopathy, resolved -Likely secondary to DKA, metabolic derangements, UTI. -Resolved.  4.  Sepsis due to UTI, POA -Urine cultures with < 10,0000 colonies however was on antibiotics prior to admission. -Patient treated with aztreonam. -Patient remained afebrile throughout the hospitalization.   -Leukocytosis trended down and resolved.  -Now on RIPE -Outpatient follow-up with ID.  5.  Recent bladder infection secondary to BCG on ripe therapy per ID in [place] -Previously discussed with ID and patient back on RIPE therapy which he is tolerating. -Outpatient follow-up  with primary ID doc in [place] on discharge.  6.  Hypertension -Remained stable.  7.  Metabolic acidosis -Likely secondary to CKD in the setting of multiple loose watery stools. -Bicarb tablets ordered however patient noted to be  refusing and as such no significant improvement with metabolic acidosis. -Acidosis improved on bicarb drip.   -Outpatient follow-up.  8.  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 -Likely secondary to prerenal azotemia in the setting of volume depletion and DKA. -Creatinine peaked at 5.5 and downtrending with gentle IV fluids. -Creatinine was down to 1.89.   -IV fluids saline lock.   -Outpatient follow-up with PCP and urology as scheduled - foley catheter to remain intact per urology recommendations   9.  Anemia of chronic disease -Hemoglobin stable at 9.0.  10.  Hyperkalemia -Resolved.  11.  Thrombocytosis -Secondary to hemoconcentration on admission, resolved.  12.  Trigeminy -2D echo stable with no wall motion abnormalities. -- Patient placed on metoprolol.    13. hyperphosphatemia/hypermagnesemia -Likely due to hemoconcentration.   -Electrolytes repleted . -Outpatient follow-up.   14.  Moderate protein calorie malnutrition -Patient placed on nutritional supplementation.    15.  Constipation -Patient with bowel movements after being placed on sorbitol and Dulcolax suppository.  -Patient with multiple loose stools early on which have since resolved with Imodium.. -Imodium as needed for loose stools. -MiraLAX was discontinued.  16.  Incomplete emptying of bladder/urinary retention/bulbar urethral stricture -Patient with a history of incomplete bladder emptying, history of bladder cancer with complaints of inability to empty his bladder and having to strain. -Foley catheter attempted by 2 nurses however met resistance and inability to place Foley catheter  -Urology consulted patient seen in consultation by Dr. Tresa Moore and patient underwent bedside cystoscopy 09/29/2021 with several false passages including bladder neck and Foley catheter placement over wire with  recommendations to keep Foley catheter in on discharge with outpatient follow-up and consideration for voiding trial in 2 to 3 weeks.   -Patient will follow up with Dr. Collene Mares in 2 to 3 weeks for further evaluation and management.   -Patient also maintained on home regimen Flomax.  17.  Pressure injury of skin, and coccyx and left knee, POA Pressure Injury 09/20/21 Coccyx Medial Stage 1 -  Intact skin with non-blanchable redness of a localized area usually over a bony prominence. (Active)  09/20/21 1530  Location: Coccyx  Location Orientation: Medial  Staging: Stage 1 -  Intact skin with non-blanchable redness of a localized area usually over a bony prominence.  Wound Description (Comments):   Present on Admission: Yes     Pressure Injury 09/20/21 Knee Anterior;Left Stage 1 -  Intact skin with non-blanchable redness of a localized area usually over a bony prominence. (Active)  09/20/21 1530  Location: Knee  Location Orientation: Anterior;Left  Staging: Stage 1 -  Intact skin with non-blanchable redness of a localized area usually over a bony prominence.  Wound Description (Comments):   Present on Admission: Yes    Procedures: Upper endoscopy 09/24/2021 CT abdomen and pelvis 09/20/2021 2D echo 09/21/2021 Bedside cystoscopy with several false passages including bladder neck, Foley catheter placement per urology, Dr. Tresa Moore 09/29/2021  Consultations: Urology: Dr. Tresa Moore 09/29/2021 Gastroenterology: Dr. Michail Sermon 09/23/2021  Discharge Exam: Vitals:   09/29/21 2202 09/30/21 0503  BP: (!) 117/58 97/74  Pulse: 83 77  Resp: 18 16  Temp: 98.7 F (37.1 C) 98.1 F (36.7 C)  SpO2: 99% 98%    General: NAD Cardiovascular: RRR no murmurs rubs or gallops.  No JVD.  No lower extremity edema. Respiratory: Clear to auscultation bilaterally.  No wheezes, no crackles, no rhonchi.  Discharge Instructions   Discharge Instructions     Diet Carb Modified   Complete by: As directed    Discharge  wound care:   Complete by: As directed    As above   Increase activity slowly   Complete by: As directed       Allergies as of 09/30/2021       Reactions   Apple Anaphylaxis   Penicillins Swelling   Swelling of lip per patient        Medication List     STOP taking these medications    ciprofloxacin 500 MG tablet Commonly known as: CIPRO       TAKE these medications    Ensure Max Protein Liqd Take 330 mLs (11 oz total) by mouth 2 (two) times daily.   (feeding supplement) PROSource Plus liquid Take 30 mLs by mouth 2 (two) times daily between meals.   acetaminophen 325 MG tablet Commonly known as: TYLENOL Take 2 tablets (650 mg total) by mouth every 6 (six) hours as needed for mild pain (or Fever >/= 101).   bismuth subsalicylate 237 MG chewable tablet Commonly known as: PEPTO BISMOL Chew 2 tablets (524 mg total) by mouth 4 (four) times daily -  before meals and at bedtime for 6 days.   Cholecalciferol 50 MCG (2000 UT) Tabs Take 3,000 Units by mouth daily.   ethambutol 400 MG tablet Commonly known as: MYAMBUTOL Take 3 tablets (1,200 mg total) by mouth daily.   glimepiride 4 MG tablet Commonly known as: AMARYL Take 4 mg by mouth daily with breakfast.   insulin aspart 100 UNIT/ML FlexPen Commonly known as: NOVOLOG Inject 4 Units into the skin 3 (three) times daily with meals. Give only if patient consumes > 50% of meals.   insulin glargine-yfgn 100 UNIT/ML injection Commonly known as: SEMGLEE Inject 0.1 mLs (10 Units total) into the skin daily.   isoniazid 300 MG tablet Commonly known as: NYDRAZID Take 1 tablet (300 mg total) by mouth daily.   metroNIDAZOLE 500 MG tablet Commonly known as: FLAGYL Take 1 tablet (500 mg total) by mouth 3 (three) times daily for 6 days.   multivitamin with minerals Tabs tablet Take 1 tablet by mouth daily.   ondansetron 8 MG disintegrating tablet Commonly known as: Zofran ODT Take 1 tablet (8 mg total) by mouth  every 8 (eight) hours as needed for nausea or vomiting.   pantoprazole 40 MG tablet Commonly known as: PROTONIX Take 1 tablet (40 mg total) by mouth 2 (two) times daily for 6 days.   pyridOXINE 50 MG tablet Commonly known as: B-6 Take 1 tablet (50 mg total) by mouth daily.   rifampin 300 MG capsule Commonly known as: RIFADIN Take 2 capsules (600 mg total) by mouth daily.   senna-docusate 8.6-50 MG tablet Commonly known as: Senokot-S Take 1 tablet by mouth 2 (two) times daily.   tamsulosin 0.4 MG Caps capsule Commonly known as: FLOMAX Take 0.4 mg by mouth daily.   tetracycline 500 MG capsule Commonly known as: SUMYCIN Take 1 capsule (500 mg total) by mouth 3 (three) times daily for 6 days.               Discharge Care Instructions  (From admission, onward)  Start     Ordered   09/29/21 0000  Discharge wound care:       Comments: As above   09/29/21 2055           Allergies  Allergen Reactions   Apple Anaphylaxis   Penicillins Swelling    Swelling of lip per patient    Contact information for follow-up providers     [facility] Follow up.   Why: Office will call to arrange FU appt in 2-3 weeks. Contact information: [place] [phone]        [facility] SNF Follow up.          Tsosie Billing, MD. Schedule an appointment as soon as possible for a visit in 3 week(s).   Specialty: Infectious Diseases Why: Follow-up in 3 weeks or as previously scheduled. Contact information: [place] [address] [phone]         Wilford Corner, MD. Schedule an appointment as soon as possible for a visit in 3 week(s).   Specialty: Gastroenterology Contact information: [address] [place] [address] [phone]              Contact information for after-discharge care     Destination     [facility] .    Service: Skilled Nursing Contact information: [address] [place] [phone]                      The results of significant diagnostics from this hospitalization (including imaging, microbiology, ancillary and laboratory) are listed below for reference.    Significant Diagnostic Studies: CT ABDOMEN PELVIS WO CONTRAST  Result Date: 09/20/2021 CLINICAL DATA:  Weakness and lethargy. Not eating or drinking x2 days. History low-grade bladder cancer treated with TURBT and BCG. 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 EXAM: CT ABDOMEN AND PELVIS WITHOUT CONTRAST TECHNIQUE: Multidetector CT imaging of the abdomen and pelvis was performed following the standard protocol without IV contrast. COMPARISON:  CT abdomen pelvis 08/09/2021 FINDINGS: Lower chest: No acute abnormality. Hepatobiliary: A 0.6 cm oval circumscribed hypodensity at the dome of the liver is stable dating back to 07/28/2018, likely benign hepatic cyst (series 2, image 11). Status post cholecystectomy. No biliary dilatation. Pancreas: Unremarkable. No pancreatic ductal dilatation or surrounding inflammatory changes. Spleen: Normal in size without focal abnormality. Adrenals/Urinary Tract: Adrenal glands are unremarkable. Redemonstration of severe bilateral hydroureteronephrosis. No nephrolithiasis or perinephric fat stranding. Previously noted tiny left renal cortical cyst is not appreciated. No new renal mass. Urinary bladder is mildly distended with mild diffuse wall thickening measuring 0.4 cm and mild adjacent fat stranding (series 2, image 70. Stomach/Bowel: Diffuse thickening of the distal esophagus, which can be seen in the setting of reflux. There is a 3 cm area of thickening versus mass in the gastric fundus (series 2, image 14), new compared to CT 07/28/2018. Small bowel  feces sign is seen within the distal ileum, consistent with slow transit. No dilatation of the small bowel. Stool is seen within the ascending colon with hard formed stools noted in transverse colon and scattered throughout the descending colon. Scattered diverticula in the sigmoid colon. No inflammatory changes of the bowel. Vascular/Lymphatic: Minimal scattered aortoiliac calcific atherosclerosis. No abdominal aortic aneurysm. No enlarged abdominal or pelvic lymph nodes. The previously noted small  perirectal lymph node seen on 08/06/2021 are not appreciated on today's exam. Reproductive: Postoperative changes of resection. Other: Small bilateral fat containing inguinal hernias. Musculoskeletal: Stable sclerotic lesions in the right iliac bone and left acetabulum, unchanged compared to 07/28/2018. multilevel degenerative disc disease in the lumbar spine, most severe at L1-L2 with vacuum disc phenomenon. No suspicious osseous lesion. IMPRESSION: 1. Diffuse mild wall thickening of the urinary bladder with mild adjacent fat stranding, suggestive of recurrent cystitis. 2. Unchanged bilateral severe hydroureteronephrosis. No nephrolithiasis or perinephric fat stranding. 3. In the gastric fundus, there is a 3 cm mass versus area of focal thickening. Recommend further evaluation with EGD for direct visualization. 4. Hard formed stools in the transverse and descending colon with small bowel feces sign in the distal ileum, indicative of slow transit. No bowel obstruction. Electronically Signed   By: Ileana Roup M.D.   On: 09/20/2021 12:44   ECHOCARDIOGRAM COMPLETE  Result Date: 09/21/2021    ECHOCARDIOGRAM REPORT   Patient Name:   Adam Chung Date of Exam: 09/21/2021 Medical Rec #:  [id number]         Height:       71.0 in Accession #:    [id number]        Weight:       157.4 lb Date of Birth:  1950-12-28         BSA:          1.905 m Patient Age:    12 years          BP:           119/67 mmHg Patient Gender: M                  HR:           96 bpm. Exam Location:  Inpatient Procedure: 2D Echo, Cardiac Doppler, Color Doppler and Intracardiac            Opacification Agent Indications:    Trigeminy [[id number]]  History:        Patient has no prior history of Echocardiogram examinations.                 Risk Factors:Diabetes. Cancer.  Sonographer:    Darlina Sicilian RDCS Referring Phys: [id number] DAVID MANUEL [place]  Sonographer Comments: Suboptimal parasternal window and suboptimal apical window. IMPRESSIONS  1. Technically difficult echo with poor image quality.  2. Left ventricular ejection fraction, by estimation, is 60 to 65%. The left ventricle has normal function. The left ventricle has no regional wall motion abnormalities. Left ventricular diastolic parameters were normal.  3. Right ventricular systolic function is mildly reduced. The right ventricular size is normal.  4. The mitral valve is grossly normal. No evidence of mitral valve regurgitation.  5. The aortic valve is normal in structure. Aortic valve regurgitation is not visualized. No aortic stenosis is present. FINDINGS  Left Ventricle: Left ventricular ejection fraction, by estimation, is 60 to 65%. The left ventricle has normal function. The left ventricle has no regional wall motion abnormalities. Definity contrast agent was given IV to delineate the left ventricular  endocardial borders. The left ventricular internal cavity size was normal in size. There is no left ventricular hypertrophy. Left ventricular diastolic parameters were normal. Right Ventricle: The right ventricular size is normal. Right vetricular wall thickness was not well visualized. Right ventricular systolic function is mildly reduced. Left Atrium: Left atrial size was normal in size. Right Atrium: Right atrial size was normal in  size. Pericardium: There is no evidence of pericardial effusion. Mitral Valve: The mitral valve is grossly normal. No evidence of mitral valve regurgitation. Tricuspid  Valve: The tricuspid valve is not well visualized. Tricuspid valve regurgitation is trivial. Aortic Valve: The aortic valve is normal in structure. Aortic valve regurgitation is not visualized. No aortic stenosis is present. Pulmonic Valve: The pulmonic valve was not well visualized. Pulmonic valve regurgitation is not visualized. Aorta: The aortic root and ascending aorta are structurally normal, with no evidence of dilitation. IAS/Shunts: The atrial septum is grossly normal. Additional Comments: Technically difficult echo with poor image quality.  LEFT VENTRICLE PLAX 2D LVOT diam:     2.10 cm   Diastology LV SV:         59        LV e' medial:    5.19 cm/s LV SV Index:   31        LV E/e' medial:  12.8 LVOT Area:     3.46 cm  LV e' lateral:   7.51 cm/s                          LV E/e' lateral: 8.8  RIGHT VENTRICLE RV S prime:     11.40 cm/s TAPSE (M-mode): 1.6 cm LEFT ATRIUM             Index LA Vol (A2C):   19.9 ml 10.45 ml/m LA Vol (A4C):   19.6 ml 10.29 ml/m LA Biplane Vol: 20.4 ml 10.71 ml/m  AORTIC VALVE LVOT Vmax:   80.80 cm/s LVOT Vmean:  57.000 cm/s LVOT VTI:    0.170 m  AORTA Ao Root diam: 3.50 cm MITRAL VALVE MV Area (PHT): 3.63 cm    SHUNTS MV Decel Time: 209 msec    Systemic VTI:  0.17 m MV E velocity: 66.40 cm/s  Systemic Diam: 2.10 cm MV A velocity: 93.00 cm/s MV E/A ratio:  0.71 Mertie Moores MD Electronically signed by Mertie Moores MD Signature Date/Time: 09/21/2021/2:08:38 PM    Final     Microbiology: Recent Results (from the past 240 hour(s))  Urine Culture     Status: Abnormal   Collection Time: 09/20/21 10:35 AM   Specimen: Urine, Clean Catch  Result Value Ref Range Status   Specimen Description   Final    URINE, CLEAN CATCH Performed at [facility], [place] [address] [place], [place] [address]    Special Requests   Final    NONE Performed at [facility], [place] [address], [place], [place] [address]    Culture (A)  Final    <10,000  COLONIES/mL INSIGNIFICANT GROWTH Performed at [place] [address], [place], [place] [address]    Report Status 09/21/2021 FINAL  Final  MRSA Next Gen by PCR, Nasal     Status: None   Collection Time: 09/20/21  3:40 PM   Specimen: Nasal Mucosa; Nasal Swab  Result Value Ref Range Status   MRSA by PCR Next Gen NOT DETECTED NOT DETECTED Final    Comment: (NOTE) The GeneXpert MRSA Assay (FDA approved for NASAL specimens only), is one component of a comprehensive MRSA colonization surveillance program. It is not intended to diagnose MRSA infection nor to guide or monitor treatment for MRSA infections. Test performance is not FDA approved in patients less than 26 years old. Performed at [facility], [place] [address] [place], [place] [address]   Resp Panel  by RT-PCR (Flu A&B, Covid) Nasal Mucosa     Status: None   Collection Time: 09/20/21  3:41 PM   Specimen: Nasal Mucosa; Nasopharyngeal(NP) swabs in vial transport medium  Result Value Ref Range Status   SARS Coronavirus 2 by RT PCR NEGATIVE NEGATIVE Final    Comment: (NOTE) SARS-CoV-2 target nucleic acids are NOT DETECTED.  The SARS-CoV-2 RNA is generally detectable in upper respiratory specimens during the acute phase of infection. The lowest concentration of SARS-CoV-2 viral copies this assay can detect is 138 copies/mL. A negative result does not preclude SARS-Cov-2 infection and should not be used as the sole basis for treatment or other patient management decisions. A negative result may occur with  improper specimen collection/handling, submission of specimen other than nasopharyngeal swab, presence of viral mutation(s) within the areas targeted by this assay, and inadequate number of viral copies(<138 copies/mL). A negative result must be combined with clinical observations, patient history, and epidemiological information. The expected result is Negative.  Fact Sheet for Patients:   EntrepreneurPulse.com.au  Fact Sheet for Healthcare Providers:  IncredibleEmployment.be  This test is no t yet approved or cleared by the [place] FDA and  has been authorized for detection and/or diagnosis of SARS-CoV-2 by FDA under an Emergency Use Authorization (EUA). This EUA will remain  in effect (meaning this test can be used) for the duration of the COVID-19 declaration under Section 564(b)(1) of the Act, 21 U.S.C.section 360bbb-3(b)(1), unless the authorization is terminated  or revoked sooner.       Influenza A by PCR NEGATIVE NEGATIVE Final   Influenza B by PCR NEGATIVE NEGATIVE Final    Comment: (NOTE) The Xpert Xpress SARS-CoV-2/FLU/RSV plus assay is intended as an aid in the diagnosis of influenza from Nasopharyngeal swab specimens and should not be used as a sole basis for treatment. Nasal washings and aspirates are unacceptable for Xpert Xpress SARS-CoV-2/FLU/RSV testing.  Fact Sheet for Patients: EntrepreneurPulse.com.au  Fact Sheet for Healthcare Providers: IncredibleEmployment.be  This test is not yet approved or cleared by the [place] FDA and has been authorized for detection and/or diagnosis of SARS-CoV-2 by FDA under an Emergency Use Authorization (EUA). This EUA will remain in effect (meaning this test can be used) for the duration of the COVID-19 declaration under Section 564(b)(1) of the Act, 21 U.S.C. section 360bbb-3(b)(1), unless the authorization is terminated or revoked.  Performed at [facility], [place] [address] [place], [place] [address]   Resp Panel by RT-PCR (Flu A&B, Covid) Nasopharyngeal Swab     Status: None   Collection Time: 09/29/21  1:11 PM   Specimen: Nasopharyngeal Swab; Nasopharyngeal(NP) swabs in vial transport medium  Result Value Ref Range Status   SARS Coronavirus 2 by RT PCR NEGATIVE NEGATIVE Final    Comment:  (NOTE) SARS-CoV-2 target nucleic acids are NOT DETECTED.  The SARS-CoV-2 RNA is generally detectable in upper respiratory specimens during the acute phase of infection. The lowest concentration of SARS-CoV-2 viral copies this assay can detect is 138 copies/mL. A negative result does not preclude SARS-Cov-2 infection and should not be used as the sole basis for treatment or other patient management decisions. A negative result may occur with  improper specimen collection/handling, submission of specimen other than nasopharyngeal swab, presence of viral mutation(s) within the areas targeted by this assay, and inadequate number of viral copies(<138 copies/mL). A negative result must be combined with clinical observations, patient history, and epidemiological information. The expected result is Negative.  Fact Sheet  for Patients:  EntrepreneurPulse.com.au  Fact Sheet for Healthcare Providers:  IncredibleEmployment.be  This test is no t yet approved or cleared by the [place] FDA and  has been authorized for detection and/or diagnosis of SARS-CoV-2 by FDA under an Emergency Use Authorization (EUA). This EUA will remain  in effect (meaning this test can be used) for the duration of the COVID-19 declaration under Section 564(b)(1) of the Act, 21 U.S.C.section 360bbb-3(b)(1), unless the authorization is terminated  or revoked sooner.       Influenza A by PCR NEGATIVE NEGATIVE Final   Influenza B by PCR NEGATIVE NEGATIVE Final    Comment: (NOTE) The Xpert Xpress SARS-CoV-2/FLU/RSV plus assay is intended as an aid in the diagnosis of influenza from Nasopharyngeal swab specimens and should not be used as a sole basis for treatment. Nasal washings and aspirates are unacceptable for Xpert Xpress SARS-CoV-2/FLU/RSV testing.  Fact Sheet for Patients: EntrepreneurPulse.com.au  Fact Sheet for Healthcare  Providers: IncredibleEmployment.be  This test is not yet approved or cleared by the [place] FDA and has been authorized for detection and/or diagnosis of SARS-CoV-2 by FDA under an Emergency Use Authorization (EUA). This EUA will remain in effect (meaning this test can be used) for the duration of the COVID-19 declaration under Section 564(b)(1) of the Act, 21 U.S.C. section 360bbb-3(b)(1), unless the authorization is terminated or revoked.  Performed at [facility], [place] [address], [place], [place] [address]      Labs: Basic Metabolic Panel: Recent Labs  Lab 09/26/21 0426 09/27/21 0423 09/28/21 0419 09/29/21 0727 09/30/21 0420  NA 136 139 136 136 133*  K 4.2 4.6 4.6 3.9 3.5  CL 110 115* 112* 105 102  CO2 16* 16* 15* 24 23  GLUCOSE 73 77 207* 156* 127*  BUN 69* 58* 50* 41* 33*  CREATININE 2.34* 2.03* 2.00* 1.89* 1.89*  CALCIUM 8.5* 8.2* 8.1* 7.6* 7.4*  MG  --  1.7 2.0  --  1.5*  PHOS  --   --   --   --  3.5    Liver Function Tests: Recent Labs  Lab 09/30/21 0420  ALBUMIN 2.1*   No results for input(s): LIPASE, AMYLASE in the last 168 hours. No results for input(s): AMMONIA in the last 168 hours. CBC: Recent Labs  Lab 09/25/21 0347 09/25/21 1438 09/26/21 0426 09/27/21 0423 09/28/21 0419 09/29/21 0727 09/30/21 0420  WBC 7.9  --  8.7 6.9  --  6.3 5.0  HGB 9.9*   < > 9.8* 9.5* 10.0* 9.0* 8.7*  HCT 30.8*   < > 31.2* 30.3* 30.6* 27.5* 26.9*  MCV 90.3  --  91.0 91.3  --  89.0 90.9  PLT 205  --  214 198  --  170 147*   < > = values in this interval not displayed.    Cardiac Enzymes: No results for input(s): CKTOTAL, CKMB, CKMBINDEX, TROPONINI in the last 168 hours. BNP: BNP (last 3 results) No results for input(s): BNP in the last 8760 hours.  ProBNP (last 3 results) No results for input(s): PROBNP in the last 8760 hours.  CBG: Recent Labs  Lab 09/29/21 0738 09/29/21 1135 09/29/21 1653 09/29/21 2154  09/30/21 0732  GLUCAP 153* 115* 244* 148* 116*        Signed:  Little Ishikawa MD.  [facility] 09/30/2021, 10:12 AM

## 2021-09-30 NOTE — Plan of Care (Signed)
  Problem: Education: Goal: Knowledge of General Education information will improve Description: Including pain rating scale, medication(s)/side effects and non-pharmacologic comfort measures Outcome: Adequate for Discharge   Problem: Health Behavior/Discharge Planning: Goal: Ability to manage health-related needs will improve Outcome: Adequate for Discharge   Problem: Clinical Measurements: Goal: Ability to maintain clinical measurements within normal limits will improve Outcome: Adequate for Discharge Goal: Will remain free from infection Outcome: Adequate for Discharge Goal: Diagnostic test results will improve Outcome: Adequate for Discharge Goal: Cardiovascular complication will be avoided Outcome: Adequate for Discharge   Problem: Activity: Goal: Risk for activity intolerance will decrease Outcome: Adequate for Discharge   Problem: Nutrition: Goal: Adequate nutrition will be maintained Outcome: Adequate for Discharge   Problem: Coping: Goal: Level of anxiety will decrease Outcome: Adequate for Discharge   Problem: Elimination: Goal: Will not experience complications related to bowel motility Outcome: Adequate for Discharge Goal: Will not experience complications related to urinary retention Outcome: Adequate for Discharge   Problem: Pain Managment: Goal: General experience of comfort will improve Outcome: Adequate for Discharge   Problem: Safety: Goal: Ability to remain free from injury will improve Outcome: Adequate for Discharge   Problem: Skin Integrity: Goal: Risk for impaired skin integrity will decrease Outcome: Adequate for Discharge   Problem: Education: Goal: Ability to describe self-care measures that may prevent or decrease complications (Diabetes Survival Skills Education) will improve Outcome: Adequate for Discharge Goal: Individualized Educational Video(s) Outcome: Adequate for Discharge   Problem: Coping: Goal: Ability to adjust to  condition or change in health will improve Outcome: Adequate for Discharge   Problem: Fluid Volume: Goal: Ability to maintain a balanced intake and output will improve Outcome: Adequate for Discharge   Problem: Health Behavior/Discharge Planning: Goal: Ability to identify and utilize available resources and services will improve Outcome: Adequate for Discharge Goal: Ability to manage health-related needs will improve Outcome: Adequate for Discharge   Problem: Metabolic: Goal: Ability to maintain appropriate glucose levels will improve Outcome: Adequate for Discharge   Problem: Nutritional: Goal: Maintenance of adequate nutrition will improve Outcome: Adequate for Discharge Goal: Progress toward achieving an optimal weight will improve Outcome: Adequate for Discharge   Problem: Skin Integrity: Goal: Risk for impaired skin integrity will decrease Outcome: Adequate for Discharge   Problem: Tissue Perfusion: Goal: Adequacy of tissue perfusion will improve Outcome: Adequate for Discharge

## 2021-09-30 NOTE — TOC Transition Note (Signed)
Transition of Care [facility]) - CM/SW Discharge Note   Patient Details  Name: Adam Chung MRN: [id number] Date of Birth: [birth year]  Transition of Care [facility]) CM/SW Contact:  Lennart Pall, LCSW Phone Number: 09/30/2021, 11:03 AM   Clinical Narrative:    Pt medically cleared for dc today to SNF and bed accepted with [facility].  Insurance Box received [phone]).  [facility] called at 10:35am for transport and RN to call [phone] for report. Pt and daughter aware/ agreeable.  No further TOC needs.   Final next level of care: Skilled Nursing Facility Barriers to Discharge: Barriers Resolved   Patient Goals and CMS Choice Patient states their goals for this hospitalization and ongoing recovery are:: Acute metabolic encephalopathy, CMS Medicare.gov Compare Post Acute Care list provided to:: Patient Choice offered to / list presented to : Patient, Adult Children  Discharge Placement PASRR number recieved: 09/24/21            Patient chooses bed at: [facility] Patient to be transferred to facility by: [place] Name of family member notified: daughter, Angie Patient and family notified of of transfer: 09/30/21  Discharge Plan and Services                DME Arranged: N/A DME Agency: NA                  Social Determinants of Health ([place]) Interventions     Readmission Risk Interventions No flowsheet data found.

## 2021-10-01 ENCOUNTER — Ambulatory Visit: Payer: Medicare Other | Admitting: Urology

## 2021-10-01 ENCOUNTER — Encounter: Payer: Self-pay | Admitting: Urology

## 2021-10-02 ENCOUNTER — Telehealth: Payer: Self-pay | Admitting: *Deleted

## 2021-10-02 ENCOUNTER — Telehealth: Payer: Self-pay

## 2021-10-02 NOTE — Telephone Encounter (Signed)
Adam Chung with [facility] called to verify that Urine Cx 08/18/21 ordered by [facility] Urology was seen by Dr Delaine Lame and that treatment is being offered. 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 His VM is full. I will continue to reach out to patient to schedule.

## 2021-10-02 NOTE — Telephone Encounter (Signed)
Nurse at [facility] Dept calling with positive TB results for pt on DOS 08/18/2021. I advised to nurse that pt is being followed by ID and that pt has adverse reaction to BCG. Per nurse she will call ID

## 2021-10-05 ENCOUNTER — Encounter (HOSPITAL_COMMUNITY): Payer: Self-pay | Admitting: Emergency Medicine

## 2021-10-05 ENCOUNTER — Inpatient Hospital Stay (HOSPITAL_COMMUNITY)
Admission: EM | Admit: 2021-10-05 | Discharge: 2021-10-10 | DRG: 699 | Disposition: A | Discharge status: Skilled Nursing Facility | Payer: Medicare Other | Attending: Internal Medicine | Admitting: Internal Medicine

## 2021-10-05 ENCOUNTER — Telehealth: Payer: Self-pay

## 2021-10-05 DIAGNOSIS — K719 Toxic liver disease, unspecified: Secondary | ICD-10-CM | POA: Insufficient documentation

## 2021-10-05 DIAGNOSIS — T83091A Other mechanical complication of indwelling urethral catheter, initial encounter: Principal | ICD-10-CM | POA: Diagnosis present

## 2021-10-05 DIAGNOSIS — E1122 Type 2 diabetes mellitus with diabetic chronic kidney disease: Secondary | ICD-10-CM | POA: Diagnosis present

## 2021-10-05 DIAGNOSIS — W19XXXA Unspecified fall, initial encounter: Secondary | ICD-10-CM | POA: Diagnosis present

## 2021-10-05 DIAGNOSIS — Z880 Allergy status to penicillin: Secondary | ICD-10-CM

## 2021-10-05 DIAGNOSIS — E440 Moderate protein-calorie malnutrition: Secondary | ICD-10-CM | POA: Diagnosis present

## 2021-10-05 DIAGNOSIS — D638 Anemia in other chronic diseases classified elsewhere: Secondary | ICD-10-CM | POA: Diagnosis present

## 2021-10-05 DIAGNOSIS — A048 Other specified bacterial intestinal infections: Secondary | ICD-10-CM | POA: Diagnosis present

## 2021-10-05 DIAGNOSIS — N179 Acute kidney failure, unspecified: Secondary | ICD-10-CM | POA: Diagnosis present

## 2021-10-05 DIAGNOSIS — Y738 Miscellaneous gastroenterology and urology devices associated with adverse incidents, not elsewhere classified: Secondary | ICD-10-CM | POA: Diagnosis present

## 2021-10-05 DIAGNOSIS — T839XXA Unspecified complication of genitourinary prosthetic device, implant and graft, initial encounter: Secondary | ICD-10-CM

## 2021-10-05 DIAGNOSIS — N320 Bladder-neck obstruction: Secondary | ICD-10-CM | POA: Diagnosis present

## 2021-10-05 DIAGNOSIS — R1011 Right upper quadrant pain: Secondary | ICD-10-CM

## 2021-10-05 DIAGNOSIS — N136 Pyonephrosis: Secondary | ICD-10-CM | POA: Diagnosis present

## 2021-10-05 DIAGNOSIS — Z20822 Contact with and (suspected) exposure to covid-19: Secondary | ICD-10-CM | POA: Diagnosis present

## 2021-10-05 DIAGNOSIS — R748 Abnormal levels of other serum enzymes: Secondary | ICD-10-CM

## 2021-10-05 DIAGNOSIS — Z8551 Personal history of malignant neoplasm of bladder: Secondary | ICD-10-CM

## 2021-10-05 DIAGNOSIS — R339 Retention of urine, unspecified: Secondary | ICD-10-CM | POA: Diagnosis present

## 2021-10-05 DIAGNOSIS — A319 Mycobacterial infection, unspecified: Secondary | ICD-10-CM | POA: Diagnosis present

## 2021-10-05 DIAGNOSIS — Z79899 Other long term (current) drug therapy: Secondary | ICD-10-CM

## 2021-10-05 DIAGNOSIS — K219 Gastro-esophageal reflux disease without esophagitis: Secondary | ICD-10-CM | POA: Diagnosis present

## 2021-10-05 DIAGNOSIS — Z794 Long term (current) use of insulin: Secondary | ICD-10-CM

## 2021-10-05 DIAGNOSIS — L89152 Pressure ulcer of sacral region, stage 2: Secondary | ICD-10-CM | POA: Diagnosis present

## 2021-10-05 DIAGNOSIS — E1165 Type 2 diabetes mellitus with hyperglycemia: Secondary | ICD-10-CM

## 2021-10-05 DIAGNOSIS — I129 Hypertensive chronic kidney disease with stage 1 through stage 4 chronic kidney disease, or unspecified chronic kidney disease: Secondary | ICD-10-CM | POA: Diagnosis present

## 2021-10-05 DIAGNOSIS — B179 Acute viral hepatitis, unspecified: Secondary | ICD-10-CM | POA: Diagnosis not present

## 2021-10-05 DIAGNOSIS — Y929 Unspecified place or not applicable: Secondary | ICD-10-CM

## 2021-10-05 DIAGNOSIS — N184 Chronic kidney disease, stage 4 (severe): Secondary | ICD-10-CM | POA: Diagnosis present

## 2021-10-05 LAB — URINALYSIS, ROUTINE W REFLEX MICROSCOPIC
Bilirubin Urine: NEGATIVE
Bilirubin Urine: NEGATIVE
Glucose, UA: 50 mg/dL — AB
Glucose, UA: 50 mg/dL — AB
Ketones, ur: 5 mg/dL — AB
Ketones, ur: 5 mg/dL — AB
Nitrite: NEGATIVE
Nitrite: NEGATIVE
Protein, ur: 100 mg/dL — AB
Protein, ur: 30 mg/dL — AB
RBC / HPF: >50 RBC/hpf — ABNORMAL HIGH (ref 0–5)
RBC / HPF: >50 RBC/hpf — ABNORMAL HIGH (ref 0–5)
Specific Gravity, Urine: 1.01 (ref 1.005–1.030)
Specific Gravity, Urine: 1.013 (ref 1.005–1.030)
WBC, UA: >50 WBC/hpf — ABNORMAL HIGH (ref 0–5)
WBC, UA: >50 WBC/hpf — ABNORMAL HIGH (ref 0–5)
pH: 5 (ref 5.0–8.0)
pH: 7 (ref 5.0–8.0)

## 2021-10-05 LAB — CBC
HCT: 35.5 % — ABNORMAL LOW (ref 39.0–52.0)
Hemoglobin: 11.5 g/dL — ABNORMAL LOW (ref 13.0–17.0)
MCH: 29.1 pg (ref 26.0–34.0)
MCHC: 32.4 g/dL (ref 30.0–36.0)
MCV: 89.9 fL (ref 80.0–100.0)
Platelets: 289 10*3/uL (ref 150–400)
RBC: 3.95 MIL/uL — ABNORMAL LOW (ref 4.22–5.81)
RDW: 14.9 % (ref 11.5–15.5)
WBC: 7.2 10*3/uL (ref 4.0–10.5)
nRBC: 0 % (ref 0.0–0.2)

## 2021-10-05 LAB — BASIC METABOLIC PANEL
Anion gap: 13 (ref 5–15)
BUN: 59 mg/dL — ABNORMAL HIGH (ref 8–23)
CO2: 21 mmol/L — ABNORMAL LOW (ref 22–32)
Calcium: 8.6 mg/dL — ABNORMAL LOW (ref 8.9–10.3)
Chloride: 98 mmol/L (ref 98–111)
Creatinine, Ser: 3.26 mg/dL — ABNORMAL HIGH (ref 0.61–1.24)
GFR, Estimated: 20 mL/min — ABNORMAL LOW (ref >60)
Glucose, Bld: 235 mg/dL — ABNORMAL HIGH (ref 70–99)
Potassium: 4.6 mmol/L (ref 3.5–5.1)
Sodium: 132 mmol/L — ABNORMAL LOW (ref 135–145)

## 2021-10-05 LAB — RESP PANEL BY RT-PCR (FLU A&B, COVID) ARPGX2
Influenza A by PCR: NEGATIVE
Influenza B by PCR: NEGATIVE
SARS Coronavirus 2 by RT PCR: NEGATIVE

## 2021-10-05 MED ORDER — SODIUM CHLORIDE 0.9 % IV BOLUS
500.0000 mL | Freq: Once | INTRAVENOUS | Status: AC
  Administered 2021-10-05: 500 mL via INTRAVENOUS

## 2021-10-05 MED ORDER — SODIUM CHLORIDE 0.9 % IV SOLN: Freq: Once | INTRAVENOUS | Status: AC

## 2021-10-05 MED ORDER — MORPHINE SULFATE (PF) 4 MG/ML IV SOLN
4.0000 mg | Freq: Once | INTRAVENOUS | Status: AC
  Administered 2021-10-05: 4 mg via INTRAVENOUS
  Filled 2021-10-05: qty 1

## 2021-10-05 MED ORDER — MORPHINE SULFATE (PF) 4 MG/ML IV SOLN
4.0000 mg | Freq: Once | INTRAVENOUS | Status: AC
  Administered 2021-10-06: 4 mg via INTRAVENOUS
  Filled 2021-10-05 (×2): qty 1

## 2021-10-05 MED ORDER — SODIUM CHLORIDE 0.9 % IV BOLUS
1000.0000 mL | Freq: Once | INTRAVENOUS | Status: AC
  Administered 2021-10-05: 1000 mL via INTRAVENOUS

## 2021-10-05 NOTE — Telephone Encounter (Signed)
Received voicemail from [place], TB RN with [facility]. She says patient's acid fast urine culture was positive. Attempted to call Tammy back, no answer. Left voicemail stating message would be passed along to Dr. Gwenevere Ghazi CMA.   Also received voicemail from Dr. Junius Creamer, medical director for [employer]. She would like to collaborate with Dr. Delaine Lame on treatment plan. Attempted to call Dr. Junius Creamer back, no answer.   Beryle Flock, RN

## 2021-10-05 NOTE — Consult Note (Signed)
Subjective:    Consult requested by Dene Gentry MD  I was asked to see Mr. Laker for an obstructed foley with unsuccessful attempt at irrigation by the ER staff.  The patient has a complicated urologic history for which I will refer the reader to Dr. Zettie Pho note from 11/1.  He had has incision of a prostatic abscess and has multiple false passages and urethral strictures that Dr. Tresa Moore was successfully able to pass cystoscopically and place a 36fr council cath over a wire.  He has had ongoing issues with catheter obstruction since and presents to day with the same.  He had 451ml on bladder scan today.   His Cr is up to 3.26 after falling to 1.89 following his prior catheterization.  ROS:  Review of Systems  Constitutional:  Negative for fever.  Gastrointestinal:  Positive for abdominal pain.   Allergies  Allergen Reactions   Apple Anaphylaxis   Penicillins Swelling    Swelling of lip per patient    Past Medical History:  Diagnosis Date   Cancer ([place])    bladder   Diabetes mellitus without complication [facility])     Past Surgical History:  Procedure Laterality Date   BIOPSY  09/24/2021   Procedure: BIOPSY;  Surgeon: Wilford Corner, MD;  Location: [facility];  Service: Endoscopy;;   BLADDER SURGERY     CYSTOSCOPY N/A 08/09/2021   Procedure: CYSTOSCOPY WITH INSERTION OF FOLEY CATHETER;  Surgeon: Janith Lima, MD;  Location: [facility] ORS;  Service: Urology;  Laterality: N/A;   ESOPHAGOGASTRODUODENOSCOPY (EGD) WITH PROPOFOL N/A 09/24/2021   Procedure: ESOPHAGOGASTRODUODENOSCOPY (EGD) WITH PROPOFOL;  Surgeon: Wilford Corner, MD;  Location: [facility];  Service: Endoscopy;  Laterality: N/A;   fatty tumor excision     TRANSURETHRAL RESECTION OF PROSTATE N/A 08/09/2021   Procedure: TRANSURETHRAL RESECTION OF THE PROSTATE (TURP);  Surgeon: Janith Lima, MD;  Location: [facility] ORS;  Service: Urology;  Laterality: N/A;    Social History   Socioeconomic History   Marital status:  Married    Spouse name: Not on file   Number of children: Not on file   Years of education: Not on file   Highest education level: Not on file  Occupational History   Not on file  Tobacco Use   Smoking status: Former   Smokeless tobacco: Never  Substance and Sexual Activity   Alcohol use: Not Currently   Drug use: Not on file   Sexual activity: Not on file  Other Topics Concern   Not on file  Social History Narrative   Not on file   Social Determinants of Health   Financial Resource Strain: Not on file  Food Insecurity: Not on file  Transportation Needs: Not on file  Physical Activity: Not on file  Stress: Not on file  Social Connections: Not on file  Intimate Partner Violence: Not on file    No family history on file.  Anti-infectives: Anti-infectives (From admission, onward)    None       Current Facility-Administered Medications  Medication Dose Route Frequency Provider Last Rate Last Admin   morphine 4 MG/ML injection 4 mg  4 mg Intravenous Once Valarie Merino, MD       Current Outpatient Medications  Medication Sig Dispense Refill   acetaminophen (TYLENOL) 325 MG tablet Take 2 tablets (650 mg total) by mouth every 6 (six) hours as needed for mild pain (or Fever >/= 101).     bismuth subsalicylate (PEPTO BISMOL) 262 MG chewable tablet  Chew 2 tablets (524 mg total) by mouth 4 (four) times daily -  before meals and at bedtime for 6 days. 30 tablet 0   Cholecalciferol 50 MCG (2000 UT) TABS Take 3,000 Units by mouth daily.     Ensure Max Protein (ENSURE MAX PROTEIN) LIQD Take 330 mLs (11 oz total) by mouth 2 (two) times daily.     ethambutol (MYAMBUTOL) 400 MG tablet Take 3 tablets (1,200 mg total) by mouth daily. 90 tablet 1   glimepiride (AMARYL) 4 MG tablet Take 4 mg by mouth daily with breakfast.     insulin aspart (NOVOLOG) 100 UNIT/ML FlexPen Inject 4 Units into the skin 3 (three) times daily with meals. Give only if patient consumes > 50% of meals. 15 mL  11   insulin glargine-yfgn (SEMGLEE) 100 UNIT/ML injection Inject 0.1 mLs (10 Units total) into the skin daily. 10 mL 0   isoniazid (NYDRAZID) 300 MG tablet Take 1 tablet (300 mg total) by mouth daily. 30 tablet 2   metroNIDAZOLE (FLAGYL) 500 MG tablet Take 1 tablet (500 mg total) by mouth 3 (three) times daily for 6 days. 18 tablet 0   Multiple Vitamin (MULTIVITAMIN WITH MINERALS) TABS tablet Take 1 tablet by mouth daily.     Nutritional Supplements (,FEEDING SUPPLEMENT, PROSOURCE PLUS) liquid Take 30 mLs by mouth 2 (two) times daily between meals.     ondansetron (ZOFRAN ODT) 8 MG disintegrating tablet Take 1 tablet (8 mg total) by mouth every 8 (eight) hours as needed for nausea or vomiting. 30 tablet 2   pantoprazole (PROTONIX) 40 MG tablet Take 1 tablet (40 mg total) by mouth 2 (two) times daily for 6 days. 12 tablet 0   pyridOXINE (B-6) 50 MG tablet Take 1 tablet (50 mg total) by mouth daily. 30 tablet 2   rifampin (RIFADIN) 300 MG capsule Take 2 capsules (600 mg total) by mouth daily. 60 capsule 2   senna-docusate (SENOKOT-S) 8.6-50 MG tablet Take 1 tablet by mouth 2 (two) times daily.     tamsulosin (FLOMAX) 0.4 MG CAPS capsule Take 0.4 mg by mouth daily.     tetracycline (SUMYCIN) 500 MG capsule Take 1 capsule (500 mg total) by mouth 3 (three) times daily for 6 days. 18 capsule 0     Objective: Vital signs in last 24 hours: BP 123/67   Pulse 89   Temp 97.7 F (36.5 C) (Oral)   Resp 15   SpO2 100%   Intake/Output from previous day: No intake/output data recorded. Intake/Output this shift: Total I/O In: -  Out: 400 [Urine:400]   Physical Exam Vitals reviewed.  Constitutional:      Appearance: Normal appearance.  Abdominal:     General: Abdomen is flat.     Tenderness: There is abdominal tenderness.  Genitourinary:    Comments: Normal phallus with a foley at the meatus.   Neurological:     Mental Status: He is alert.    Lab Results:  Results for orders placed or  performed during the hospital encounter of 10/05/21 (from the past 24 hour(s))  Basic metabolic panel     Status: Abnormal   Collection Time: 10/05/21  6:45 PM  Result Value Ref Range   Sodium 132 (L) 135 - 145 mmol/L   Potassium 4.6 3.5 - 5.1 mmol/L   Chloride 98 98 - 111 mmol/L   CO2 21 (L) 22 - 32 mmol/L   Glucose, Bld 235 (H) 70 - 99 mg/dL   BUN 59 (H)  8 - 23 mg/dL   Creatinine, Ser 3.26 (H) 0.61 - 1.24 mg/dL   Calcium 8.6 (L) 8.9 - 10.3 mg/dL   GFR, Estimated 20 (L) >60 mL/min   Anion gap 13 5 - 15  CBC     Status: Abnormal   Collection Time: 10/05/21  6:45 PM  Result Value Ref Range   WBC 7.2 4.0 - 10.5 K/uL   RBC 3.95 (L) 4.22 - 5.81 MIL/uL   Hemoglobin 11.5 (L) 13.0 - 17.0 g/dL   HCT 35.5 (L) 39.0 - 52.0 %   MCV 89.9 80.0 - 100.0 fL   MCH 29.1 26.0 - 34.0 pg   MCHC 32.4 30.0 - 36.0 g/dL   RDW 14.9 11.5 - 15.5 %   Platelets 289 150 - 400 K/uL   nRBC 0.0 0.0 - 0.2 %  Urinalysis, Routine w reflex microscopic Urine, Clean Catch     Status: Abnormal   Collection Time: 10/05/21  8:56 PM  Result Value Ref Range   Color, Urine AMBER (A) YELLOW   APPearance HAZY (A) CLEAR   Specific Gravity, Urine 1.010 1.005 - 1.030   pH 7.0 5.0 - 8.0   Glucose, UA 50 (A) NEGATIVE mg/dL   Hgb urine dipstick MODERATE (A) NEGATIVE   Bilirubin Urine NEGATIVE NEGATIVE   Ketones, ur 5 (A) NEGATIVE mg/dL   Protein, ur 100 (A) NEGATIVE mg/dL   Nitrite NEGATIVE NEGATIVE   Leukocytes,Ua LARGE (A) NEGATIVE   RBC / HPF >50 (H) 0 - 5 RBC/hpf   WBC, UA >50 (H) 0 - 5 WBC/hpf   Bacteria, UA RARE (A) NONE SEEN   Mucus PRESENT    Hyaline Casts, UA PRESENT     BMET Recent Labs    10/05/21 1845  NA 132*  K 4.6  CL 98  CO2 21*  GLUCOSE 235*  BUN 59*  CREATININE 3.26*  CALCIUM 8.6*   PT/INR No results for input(s): LABPROT, INR in the last 72 hours. ABG No results for input(s): PHART, HCO3 in the last 72 hours.  Invalid input(s): PCO2, PO2  Studies/Results: No results found. The 77fr  council catheter was removed over a wire and replaced with an 3fr council catheter with as sterile a technique as possible.  About 439ml of urine drained and a sample was sent for UA and culture.  The balloon was filled initially with 30ml but that was reduced to 51ml when the catheter was initially difficult to irrigate but with forceful aspiration I was able to remove to old clots or necrotic material with easy subsequent irrigation.   The catheter was placed to drainage.   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   I exchanged the 16 fr for an 65fr council cath and irrigated out a couple of old firm clots.  The catheter drained well upon initial placement and then irrigated easily once the clots were removed.   He will need f/u in our office in about 2 weeks and is working on setting that up.         No follow-ups on file.    CC: Dr. Dene Gentry and Dr. Windell Norfolk.      Irine Seal 10/05/2021 [id number]

## 2021-10-05 NOTE — ED Notes (Signed)
This writer went to obtain urine sample from pt leg bag. Pt leg bag does not contain any urine at this time. Pt reported a little bit of pain when this writer was bladder scanning patient. RN Notified.

## 2021-10-05 NOTE — ED Provider Notes (Signed)
[facility] DEPT Provider Note   CSN: [id number] Arrival date & time: 10/05/21  1814     History Chief Complaint  Patient presents with   clogged urinary cath     Adam Chung is a 70 y.o. male.  70 year old male with a medical history of degenerative presents for evaluation.  Patient reports inability to drain his bladder through his Foley catheter.  This has been an ongoing issue for the last several days.  Patient complains of suprapubic pain he denies fever.  He denies nausea or vomiting.  Patient reports that he is known to [facility].  Patient reports that his Foley catheter must be changed by urology.  The history is provided by the patient, a relative and medical records.  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) 09/20/2021   Normocytic anemia 09/20/2021   Hyperkalemia 09/20/2021   Thrombocytosis 09/20/2021   Trigeminy 09/20/2021   Hyperphosphatemia 09/20/2021   Hypermagnesemia 09/20/2021   Pressure injury of skin 09/20/2021   Acute renal failure ([place]) 08/08/2021   Abscess 08/07/2021   Hypertension 08/07/2021   Diabetes mellitus type 2, noninsulin dependent ([place]) 08/07/2021   CKD (chronic kidney disease) stage 3, GFR 30-59 ml/min ([place]) 08/07/2021   AKI (acute kidney injury) ([place]) 08/07/2021   Acute lower UTI 08/07/2021   Acute urinary retention 08/07/2021    Past  Surgical History:  Procedure Laterality Date   BIOPSY  09/24/2021   Procedure: BIOPSY;  Surgeon: Wilford Corner, MD;  Location: [facility];  Service: Endoscopy;;   BLADDER SURGERY     CYSTOSCOPY N/A 08/09/2021   Procedure: CYSTOSCOPY WITH INSERTION OF FOLEY CATHETER;  Surgeon: Janith Lima, MD;  Location: [facility] ORS;  Service: Urology;  Laterality: N/A;   ESOPHAGOGASTRODUODENOSCOPY (EGD) WITH PROPOFOL N/A 09/24/2021   Procedure: ESOPHAGOGASTRODUODENOSCOPY (EGD) WITH PROPOFOL;  Surgeon: Wilford Corner, MD;  Location: [facility];  Service: Endoscopy;  Laterality: N/A;   fatty tumor excision     TRANSURETHRAL RESECTION OF PROSTATE N/A 08/09/2021   Procedure: TRANSURETHRAL RESECTION OF THE PROSTATE (TURP);  Surgeon: Janith Lima, MD;  Location: [facility] ORS;  Service: Urology;  Laterality: N/A;       No family history on file.  Social History   Tobacco Use   Smoking status: Former   Smokeless tobacco: Never  Substance Use Topics   Alcohol use: Not Currently    Home Medications Prior to Admission medications   Medication Sig Start Date End Date Taking? Authorizing Provider  acetaminophen (TYLENOL) 325 MG tablet Take 2 tablets (650 mg total) by mouth every 6 (six) hours as needed for mild pain (or Fever >/= 101). 09/29/21   Eugenie Filler, MD  bismuth subsalicylate (PEPTO BISMOL) 262 MG chewable tablet Chew 2 tablets (524 mg total) by mouth 4 (four) times daily -  before meals and at bedtime for 6 days. 09/29/21 10/05/21  Eugenie Filler, MD  Cholecalciferol 50 MCG (2000 UT)  TABS Take 3,000 Units by mouth daily.    [provider]  Ensure Max Protein (ENSURE MAX PROTEIN) LIQD Take 330 mLs (11 oz total) by mouth 2 (two) times daily. 09/29/21   Eugenie Filler, MD  ethambutol (MYAMBUTOL) 400 MG tablet Take 3 tablets (1,200 mg total) by mouth daily. 08/24/21   Tsosie Billing, MD  glimepiride (AMARYL) 4 MG tablet Take 4 mg by mouth daily with breakfast.     [provider]  insulin aspart (NOVOLOG) 100 UNIT/ML FlexPen Inject 4 Units into the skin 3 (three) times daily with meals. Give only if patient consumes > 50% of meals. 09/29/21   Eugenie Filler, MD  insulin glargine-yfgn (SEMGLEE) 100 UNIT/ML injection Inject 0.1 mLs (10 Units total) into the skin daily. 09/30/21   Eugenie Filler, MD  isoniazid (NYDRAZID) 300 MG tablet Take 1 tablet (300 mg total) by mouth daily. 08/24/21   Tsosie Billing, MD  metroNIDAZOLE (FLAGYL) 500 MG tablet Take 1 tablet (500 mg total) by mouth 3 (three) times daily for 6 days. 09/29/21 10/05/21  Eugenie Filler, MD  Multiple Vitamin (MULTIVITAMIN WITH MINERALS) TABS tablet Take 1 tablet by mouth daily. 09/30/21   Eugenie Filler, MD  Nutritional Supplements (,FEEDING SUPPLEMENT, PROSOURCE PLUS) liquid Take 30 mLs by mouth 2 (two) times daily between meals. 09/30/21   Eugenie Filler, MD  ondansetron (ZOFRAN ODT) 8 MG disintegrating tablet Take 1 tablet (8 mg total) by mouth every 8 (eight) hours as needed for nausea or vomiting. 09/10/21   Kuppelweiser, Cassie L, RPH-CPP  pantoprazole (PROTONIX) 40 MG tablet Take 1 tablet (40 mg total) by mouth 2 (two) times daily for 6 days. 09/29/21 10/05/21  Eugenie Filler, MD  pyridOXINE (B-6) 50 MG tablet Take 1 tablet (50 mg total) by mouth daily. 08/24/21   Tsosie Billing, MD  rifampin (RIFADIN) 300 MG capsule Take 2 capsules (600 mg total) by mouth daily. 08/24/21   Tsosie Billing, MD  senna-docusate (SENOKOT-S) 8.6-50 MG tablet Take 1 tablet by mouth 2 (two) times daily. 09/29/21   Eugenie Filler, MD  tamsulosin (FLOMAX) 0.4 MG CAPS capsule Take 0.4 mg by mouth daily.    [provider]  tetracycline (SUMYCIN) 500 MG capsule Take 1 capsule (500 mg total) by mouth 3 (three) times daily for 6 days. 09/29/21 10/05/21  Eugenie Filler, MD    Allergies    Apple and Penicillins  Review of Systems   Review of Systems  All other  systems reviewed and are negative.  Physical Exam Updated Vital Signs BP 123/67   Pulse 89   Temp 97.7 F (36.5 C) (Oral)   Resp 15   SpO2 100%   Physical Exam Vitals and nursing note reviewed.  Constitutional:      General: He is not in acute distress.    Appearance: Normal appearance. He is well-developed.  HENT:     Head: Normocephalic and atraumatic.  Eyes:     Conjunctiva/sclera: Conjunctivae normal.     Pupils: Pupils are equal, round, and reactive to light.  Cardiovascular:     Rate and Rhythm: Normal rate and regular rhythm.     Heart sounds: Normal heart sounds.  Pulmonary:     Effort: Pulmonary effort is normal. No respiratory distress.     Breath sounds: Normal breath sounds.  Abdominal:     General: There is no distension.     Palpations: Abdomen is soft.     Tenderness: There  is abdominal tenderness.  Genitourinary:    Comments: Foley catheter in place.  Scant urine is leaking from the meatus of the penis around the catheter.  Catheter itself is not draining.  Catheter is not flushable. Musculoskeletal:        General: No deformity. Normal range of motion.     Cervical back: Normal range of motion and neck supple.  Skin:    General: Skin is warm and dry.  Neurological:     General: No focal deficit present.     Mental Status: He is alert and oriented to person, place, and time.    ED Results / Procedures / Treatments   Labs (all labs ordered are listed, but only abnormal results are displayed) Labs Reviewed  URINALYSIS, ROUTINE W REFLEX MICROSCOPIC - Abnormal; Notable for the following components:      Result Value   Color, Urine AMBER (*)    APPearance HAZY (*)    Glucose, UA 50 (*)    Hgb urine dipstick MODERATE (*)    Ketones, ur 5 (*)    Protein, ur 100 (*)    Leukocytes,Ua LARGE (*)    RBC / HPF >50 (*)    WBC, UA >50 (*)    Bacteria, UA RARE (*)    All other components within normal limits  BASIC METABOLIC PANEL - Abnormal; Notable for  the following components:   Sodium 132 (*)    CO2 21 (*)    Glucose, Bld 235 (*)    BUN 59 (*)    Creatinine, Ser 3.26 (*)    Calcium 8.6 (*)    GFR, Estimated 20 (*)    All other components within normal limits  CBC - Abnormal; Notable for the following components:   RBC 3.95 (*)    Hemoglobin 11.5 (*)    HCT 35.5 (*)    All other components within normal limits  URINE CULTURE    EKG None  Radiology No results found.  Procedures Procedures   Medications Ordered in ED Medications  morphine 4 MG/ML injection 4 mg (0 mg Intravenous Hold 10/05/21 2119)  0.9 %  sodium chloride infusion (has no administration in time range)  morphine 4 MG/ML injection 4 mg (4 mg Intravenous Given 10/05/21 2022)  sodium chloride 0.9 % bolus 500 mL (0 mLs Intravenous Stopped 10/05/21 2119)  sodium chloride 0.9 % bolus 1,000 mL (1,000 mLs Intravenous New Bag/Given 10/05/21 2043)    ED Course  I have reviewed the triage vital signs and the nursing notes.  Pertinent labs & imaging results that were available during my care of the patient were reviewed by me and considered in my medical decision making (see chart for details).    MDM Rules/Calculators/A&P                           MDM  MSE complete  DAMYON MULLANE was evaluated in Emergency Department on 10/05/2021 for the symptoms described in the history of present illness. He was evaluated in the context of the global COVID-19 pandemic, which necessitated consideration that the patient might be at risk for infection with the SARS-CoV-2 virus that causes COVID-19. Institutional protocols and algorithms that pertain to the evaluation of patients at risk for COVID-19 are in a state of rapid change based on information released by regulatory bodies including the CDC and federal and state organizations. These policies and algorithms were followed during the patient's care in the  ED.  Patient presented with inability to drain his bladder through  his indwelling catheter.  Catheter was not able to be flushed.  Dr. Jeffie Pollock of Urology consulted for replacement of catheter.  After replacement, catheter is draining easily.  Patient's labs are notably abnormal with elevated creatinine.  Urine sample obtained was from leg bag prior to catheter change. Doubt active UTI.  Patient would benefit from admission.  Hospitalist service is aware of case and will evaluate patient.   Final Clinical Impression(s) / ED Diagnoses Final diagnoses:  AKI (acute kidney injury) ([place])  Foley catheter problem, initial encounter [facility])    Rx / DC Orders ED Discharge Orders     None        Valarie Merino, MD 10/05/21 2210

## 2021-10-05 NOTE — ED Notes (Signed)
Bladder scan shows ~390mL. 

## 2021-10-05 NOTE — ED Notes (Signed)
Urology cart outside of patients room

## 2021-10-05 NOTE — ED Triage Notes (Signed)
Per EMS-states patient currently resides at [facility]-history of bladder cancer-states his catheter is clogged-states he has to have it place via scope-unable to do at facility

## 2021-10-06 ENCOUNTER — Observation Stay (HOSPITAL_COMMUNITY): Payer: Medicare Other

## 2021-10-06 ENCOUNTER — Encounter (HOSPITAL_COMMUNITY): Payer: Self-pay | Admitting: Internal Medicine

## 2021-10-06 DIAGNOSIS — N184 Chronic kidney disease, stage 4 (severe): Secondary | ICD-10-CM | POA: Diagnosis present

## 2021-10-06 DIAGNOSIS — N179 Acute kidney failure, unspecified: Secondary | ICD-10-CM

## 2021-10-06 DIAGNOSIS — Z794 Long term (current) use of insulin: Secondary | ICD-10-CM | POA: Diagnosis not present

## 2021-10-06 DIAGNOSIS — A048 Other specified bacterial intestinal infections: Secondary | ICD-10-CM

## 2021-10-06 DIAGNOSIS — N320 Bladder-neck obstruction: Secondary | ICD-10-CM | POA: Diagnosis present

## 2021-10-06 DIAGNOSIS — Z7984 Long term (current) use of oral hypoglycemic drugs: Secondary | ICD-10-CM | POA: Diagnosis not present

## 2021-10-06 DIAGNOSIS — N136 Pyonephrosis: Secondary | ICD-10-CM | POA: Diagnosis present

## 2021-10-06 DIAGNOSIS — A319 Mycobacterial infection, unspecified: Secondary | ICD-10-CM | POA: Diagnosis present

## 2021-10-06 DIAGNOSIS — Y929 Unspecified place or not applicable: Secondary | ICD-10-CM | POA: Diagnosis not present

## 2021-10-06 DIAGNOSIS — Z79899 Other long term (current) drug therapy: Secondary | ICD-10-CM | POA: Diagnosis not present

## 2021-10-06 DIAGNOSIS — R748 Abnormal levels of other serum enzymes: Secondary | ICD-10-CM | POA: Diagnosis present

## 2021-10-06 DIAGNOSIS — Z880 Allergy status to penicillin: Secondary | ICD-10-CM | POA: Diagnosis not present

## 2021-10-06 DIAGNOSIS — E1165 Type 2 diabetes mellitus with hyperglycemia: Secondary | ICD-10-CM | POA: Diagnosis not present

## 2021-10-06 DIAGNOSIS — N3080 Other cystitis without hematuria: Secondary | ICD-10-CM

## 2021-10-06 DIAGNOSIS — E1110 Type 2 diabetes mellitus with ketoacidosis without coma: Secondary | ICD-10-CM | POA: Diagnosis not present

## 2021-10-06 DIAGNOSIS — L89152 Pressure ulcer of sacral region, stage 2: Secondary | ICD-10-CM | POA: Diagnosis present

## 2021-10-06 DIAGNOSIS — I129 Hypertensive chronic kidney disease with stage 1 through stage 4 chronic kidney disease, or unspecified chronic kidney disease: Secondary | ICD-10-CM | POA: Diagnosis present

## 2021-10-06 DIAGNOSIS — R339 Retention of urine, unspecified: Secondary | ICD-10-CM | POA: Diagnosis present

## 2021-10-06 DIAGNOSIS — Z87891 Personal history of nicotine dependence: Secondary | ICD-10-CM | POA: Diagnosis not present

## 2021-10-06 DIAGNOSIS — T83091A Other mechanical complication of indwelling urethral catheter, initial encounter: Secondary | ICD-10-CM | POA: Diagnosis present

## 2021-10-06 DIAGNOSIS — K219 Gastro-esophageal reflux disease without esophagitis: Secondary | ICD-10-CM | POA: Diagnosis present

## 2021-10-06 DIAGNOSIS — K719 Toxic liver disease, unspecified: Secondary | ICD-10-CM | POA: Diagnosis present

## 2021-10-06 DIAGNOSIS — B179 Acute viral hepatitis, unspecified: Secondary | ICD-10-CM | POA: Diagnosis not present

## 2021-10-06 DIAGNOSIS — E440 Moderate protein-calorie malnutrition: Secondary | ICD-10-CM | POA: Diagnosis present

## 2021-10-06 DIAGNOSIS — Z8551 Personal history of malignant neoplasm of bladder: Secondary | ICD-10-CM | POA: Diagnosis not present

## 2021-10-06 DIAGNOSIS — E1122 Type 2 diabetes mellitus with diabetic chronic kidney disease: Secondary | ICD-10-CM | POA: Diagnosis present

## 2021-10-06 DIAGNOSIS — T83098A Other mechanical complication of other indwelling urethral catheter, initial encounter: Secondary | ICD-10-CM | POA: Diagnosis not present

## 2021-10-06 DIAGNOSIS — Z20822 Contact with and (suspected) exposure to covid-19: Secondary | ICD-10-CM | POA: Diagnosis present

## 2021-10-06 DIAGNOSIS — D638 Anemia in other chronic diseases classified elsewhere: Secondary | ICD-10-CM | POA: Diagnosis present

## 2021-10-06 DIAGNOSIS — Y738 Miscellaneous gastroenterology and urology devices associated with adverse incidents, not elsewhere classified: Secondary | ICD-10-CM | POA: Diagnosis present

## 2021-10-06 DIAGNOSIS — T50A95A Adverse effect of other bacterial vaccines, initial encounter: Secondary | ICD-10-CM

## 2021-10-06 DIAGNOSIS — W19XXXA Unspecified fall, initial encounter: Secondary | ICD-10-CM | POA: Diagnosis present

## 2021-10-06 LAB — HEPATITIS PANEL, ACUTE
HCV Ab: NONREACTIVE
Hep A IgM: NONREACTIVE
Hep B C IgM: NONREACTIVE
Hepatitis B Surface Ag: NONREACTIVE

## 2021-10-06 LAB — CBC WITH DIFFERENTIAL/PLATELET
Abs Immature Granulocytes: 0.02 10*3/uL (ref 0.00–0.07)
Basophils Absolute: 0.1 10*3/uL (ref 0.0–0.1)
Basophils Relative: 1 %
Eosinophils Absolute: 0 10*3/uL (ref 0.0–0.5)
Eosinophils Relative: 0 %
HCT: 30.6 % — ABNORMAL LOW (ref 39.0–52.0)
Hemoglobin: 9.9 g/dL — ABNORMAL LOW (ref 13.0–17.0)
Immature Granulocytes: 0 %
Lymphocytes Relative: 17 %
Lymphs Abs: 0.9 10*3/uL (ref 0.7–4.0)
MCH: 29.2 pg (ref 26.0–34.0)
MCHC: 32.4 g/dL (ref 30.0–36.0)
MCV: 90.3 fL (ref 80.0–100.0)
Monocytes Absolute: 0.5 10*3/uL (ref 0.1–1.0)
Monocytes Relative: 11 %
Neutro Abs: 3.6 10*3/uL (ref 1.7–7.7)
Neutrophils Relative %: 71 %
Platelets: 213 10*3/uL (ref 150–400)
RBC: 3.39 MIL/uL — ABNORMAL LOW (ref 4.22–5.81)
RDW: 15.1 % (ref 11.5–15.5)
WBC: 5.2 10*3/uL (ref 4.0–10.5)
nRBC: 0 % (ref 0.0–0.2)

## 2021-10-06 LAB — COMPREHENSIVE METABOLIC PANEL
ALT: 111 U/L — ABNORMAL HIGH (ref 0–44)
AST: 112 U/L — ABNORMAL HIGH (ref 15–41)
Albumin: 2.2 g/dL — ABNORMAL LOW (ref 3.5–5.0)
Alkaline Phosphatase: 132 U/L — ABNORMAL HIGH (ref 38–126)
Anion gap: 6 (ref 5–15)
BUN: 48 mg/dL — ABNORMAL HIGH (ref 8–23)
CO2: 21 mmol/L — ABNORMAL LOW (ref 22–32)
Calcium: 7.7 mg/dL — ABNORMAL LOW (ref 8.9–10.3)
Chloride: 107 mmol/L (ref 98–111)
Creatinine, Ser: 2.39 mg/dL — ABNORMAL HIGH (ref 0.61–1.24)
GFR, Estimated: 28 mL/min — ABNORMAL LOW (ref >60)
Glucose, Bld: 166 mg/dL — ABNORMAL HIGH (ref 70–99)
Potassium: 4.1 mmol/L (ref 3.5–5.1)
Sodium: 134 mmol/L — ABNORMAL LOW (ref 135–145)
Total Bilirubin: 1.7 mg/dL — ABNORMAL HIGH (ref 0.3–1.2)
Total Protein: 5.6 g/dL — ABNORMAL LOW (ref 6.5–8.1)

## 2021-10-06 LAB — GLUCOSE, CAPILLARY
Glucose-Capillary: 78 mg/dL (ref 70–99)
Glucose-Capillary: 86 mg/dL (ref 70–99)

## 2021-10-06 LAB — MAGNESIUM: Magnesium: 1.6 mg/dL — ABNORMAL LOW (ref 1.7–2.4)

## 2021-10-06 LAB — CK: Total CK: 22 U/L — ABNORMAL LOW (ref 49–397)

## 2021-10-06 LAB — CBG MONITORING, ED
Glucose-Capillary: 127 mg/dL — ABNORMAL HIGH (ref 70–99)
Glucose-Capillary: 96 mg/dL (ref 70–99)

## 2021-10-06 LAB — URINE CULTURE: Culture: NO GROWTH

## 2021-10-06 LAB — PROTIME-INR
INR: 1.5 — ABNORMAL HIGH (ref 0.8–1.2)
Prothrombin Time: 17.8 seconds — ABNORMAL HIGH (ref 11.4–15.2)

## 2021-10-06 LAB — APTT: aPTT: 40 seconds — ABNORMAL HIGH (ref 24–36)

## 2021-10-06 MED ORDER — ONDANSETRON HCL 4 MG PO TABS: 4.0000 mg | ORAL_TABLET | Freq: Four times a day (QID) | ORAL | Status: DC | PRN

## 2021-10-06 MED ORDER — CHLORHEXIDINE GLUCONATE CLOTH 2 % EX PADS
6.0000 | MEDICATED_PAD | Freq: Every day | CUTANEOUS | Status: DC
  Administered 2021-10-06 – 2021-10-10 (×5): 6 via TOPICAL

## 2021-10-06 MED ORDER — ONDANSETRON HCL 4 MG/2ML IJ SOLN: 4.0000 mg | Freq: Four times a day (QID) | INTRAMUSCULAR | Status: DC | PRN

## 2021-10-06 MED ORDER — POLYETHYLENE GLYCOL 3350 17 G PO PACK: 17.0000 g | PACK | Freq: Every day | ORAL | Status: DC | PRN

## 2021-10-06 MED ORDER — RIFAMPIN 300 MG PO CAPS
600.0000 mg | ORAL_CAPSULE | Freq: Every day | ORAL | Status: DC
  Administered 2021-10-06 – 2021-10-07 (×2): 600 mg via ORAL
  Filled 2021-10-06 (×2): qty 2

## 2021-10-06 MED ORDER — LACTATED RINGERS IV SOLN: INTRAVENOUS | Status: DC

## 2021-10-06 MED ORDER — ADULT MULTIVITAMIN W/MINERALS CH
1.0000 | ORAL_TABLET | Freq: Every day | ORAL | Status: DC
  Administered 2021-10-06 – 2021-10-10 (×5): 1 via ORAL
  Filled 2021-10-06 (×5): qty 1

## 2021-10-06 MED ORDER — SENNOSIDES-DOCUSATE SODIUM 8.6-50 MG PO TABS
1.0000 | ORAL_TABLET | Freq: Two times a day (BID) | ORAL | Status: DC
  Administered 2021-10-06 – 2021-10-08 (×5): 1 via ORAL
  Filled 2021-10-06 (×7): qty 1

## 2021-10-06 MED ORDER — INSULIN GLARGINE-YFGN 100 UNIT/ML ~~LOC~~ SOLN
10.0000 [IU] | Freq: Every day | SUBCUTANEOUS | Status: DC
  Administered 2021-10-06: 10 [IU] via SUBCUTANEOUS
  Filled 2021-10-06 (×2): qty 0.1

## 2021-10-06 MED ORDER — TAMSULOSIN HCL 0.4 MG PO CAPS
0.4000 mg | ORAL_CAPSULE | Freq: Every day | ORAL | Status: DC
  Administered 2021-10-06 – 2021-10-10 (×5): 0.4 mg via ORAL
  Filled 2021-10-06 (×5): qty 1

## 2021-10-06 MED ORDER — VITAMIN B-6 50 MG PO TABS
50.0000 mg | ORAL_TABLET | Freq: Every day | ORAL | Status: DC
  Administered 2021-10-06 – 2021-10-07 (×2): 50 mg via ORAL
  Filled 2021-10-06 (×2): qty 1

## 2021-10-06 MED ORDER — ETHAMBUTOL HCL 400 MG PO TABS
1200.0000 mg | ORAL_TABLET | Freq: Every day | ORAL | Status: DC
  Administered 2021-10-06 – 2021-10-07 (×2): 1200 mg via ORAL
  Filled 2021-10-06 (×2): qty 3

## 2021-10-06 MED ORDER — HEPARIN SODIUM (PORCINE) 5000 UNIT/ML IJ SOLN
5000.0000 [IU] | Freq: Three times a day (TID) | INTRAMUSCULAR | Status: DC
  Administered 2021-10-06 – 2021-10-10 (×13): 5000 [IU] via SUBCUTANEOUS
  Filled 2021-10-06 (×13): qty 1

## 2021-10-06 MED ORDER — ACETAMINOPHEN 650 MG RE SUPP: 650.0000 mg | Freq: Four times a day (QID) | RECTAL | Status: DC | PRN

## 2021-10-06 MED ORDER — INSULIN ASPART 100 UNIT/ML IJ SOLN
0.0000 [IU] | Freq: Three times a day (TID) | INTRAMUSCULAR | Status: DC
Start: 2021-10-06 — End: 2021-10-10
  Administered 2021-10-06: 2 [IU] via SUBCUTANEOUS
  Administered 2021-10-08: 5 [IU] via SUBCUTANEOUS
  Administered 2021-10-09: 3 [IU] via SUBCUTANEOUS
  Administered 2021-10-09: 5 [IU] via SUBCUTANEOUS
  Administered 2021-10-10: 3 [IU] via SUBCUTANEOUS
  Filled 2021-10-06: qty 0.15

## 2021-10-06 MED ORDER — ISONIAZID 300 MG PO TABS
300.0000 mg | ORAL_TABLET | Freq: Every day | ORAL | Status: DC
  Administered 2021-10-06 – 2021-10-07 (×2): 300 mg via ORAL
  Filled 2021-10-06 (×2): qty 1

## 2021-10-06 MED ORDER — ACETAMINOPHEN 325 MG PO TABS
650.0000 mg | ORAL_TABLET | Freq: Four times a day (QID) | ORAL | Status: DC | PRN
  Administered 2021-10-06 – 2021-10-09 (×3): 650 mg via ORAL
  Filled 2021-10-06 (×3): qty 2

## 2021-10-06 MED ORDER — ENSURE MAX PROTEIN PO LIQD
11.0000 [oz_av] | Freq: Two times a day (BID) | ORAL | Status: DC
  Administered 2021-10-06 – 2021-10-07 (×2): 11 [oz_av] via ORAL
  Filled 2021-10-06 (×5): qty 330

## 2021-10-06 MED ORDER — INSULIN ASPART 100 UNIT/ML IJ SOLN
4.0000 [IU] | Freq: Three times a day (TID) | INTRAMUSCULAR | Status: DC
Start: 2021-10-06 — End: 2021-10-07
  Filled 2021-10-06: qty 0.04

## 2021-10-06 MED ORDER — PANTOPRAZOLE SODIUM 40 MG PO TBEC
40.0000 mg | DELAYED_RELEASE_TABLET | Freq: Every day | ORAL | Status: DC
  Administered 2021-10-06 – 2021-10-10 (×5): 40 mg via ORAL
  Filled 2021-10-06 (×5): qty 1

## 2021-10-06 MED ORDER — PROSOURCE PLUS PO LIQD
30.0000 mL | Freq: Two times a day (BID) | ORAL | Status: DC
  Administered 2021-10-06: 30 mL via ORAL
  Filled 2021-10-06 (×3): qty 30

## 2021-10-06 MED ORDER — INSULIN ASPART 100 UNIT/ML FLEXPEN: 4.0000 [IU] | PEN_INJECTOR | Freq: Three times a day (TID) | SUBCUTANEOUS | Status: DC

## 2021-10-06 NOTE — Hospital Course (Addendum)
Mr. Pine is a 70 yo male with PMH DMII, bladder cancer s/p BCG and gemcitabine, recent H. Pylori (s/p completed abx), CKDIV, ACD, urinary retention who presented with lower abdominal pain and decreased urinary output from his Foley. He was evaluated by urology and underwent Foley exchange which was found to have old firm clots and possible underlying necrotic tissue. In association with bladder outlet obstruction, he had acute on chronic worsening of his renal function.  This improved after Foley exchange, bladder drainage, and IV fluids.  He has been followed by ID since late September 2022 due to AFB infection involving the urine associated with recent BCG injections and gemcitabine.  He was started on rifampin, isoniazid, ethambutol, pyridoxine.  He was continued on this treatment for approximately 3 weeks then had stopped for 1 to 2 weeks and was restarted for the last 2 to 3 weeks.  On admission, he was noted to have mildly elevated LFTs.  Right upper quadrant ultrasound showed normal-appearing liver.  Hepatitis panel also negative. MRCP then obtained and also negative for obstruction. His elevation was considered in context of his treatment with rifampin, INH, and ethambutol. ID and GI were consulted during hospitalization as well.

## 2021-10-06 NOTE — Assessment & Plan Note (Signed)
-   see urinary retention

## 2021-10-06 NOTE — Plan of Care (Signed)

## 2021-10-06 NOTE — Progress Notes (Signed)
Progress Note    Adam Chung   PPI:[id number]  DOB: 21-Sep-1951  DOA: 10/05/2021     0 PCP: Rusty Aus, MD  Initial CC: abd pain, decreased foley output   Hospital Course: Adam Chung is a 70 yo male with PMH DMII, bladder cancer s/p BCG and gemcitabine, recent H. Pylori (s/p completed abx), CKDIV, ACD, urinary retention who presented with lower abdominal pain and decreased urinary output from his Foley. Adam Chung was evaluated by urology and underwent Foley exchange which was found to have old firm clots and possible underlying necrotic tissue. In association with bladder outlet obstruction, Adam Chung had acute on chronic worsening of his renal function.  This improved after Foley exchange, bladder drainage, and IV fluids.  Adam Chung has been followed by ID since late September 2022 due to AFB infection involving the urine associated with recent BCG injections and gemcitabine.  Adam Chung was started on rifampin, isoniazid, ethambutol, pyridoxine.  Adam Chung was continued on this treatment for approximately 3 weeks then had stopped for 1 to 2 weeks and was restarted for the last 2 to 3 weeks.  On admission, Adam Chung was noted to have mildly elevated LFTs.  Right upper quadrant ultrasound showed normal-appearing liver.  Hepatitis panel also negative. Case was discussed with ID and Adam Chung has close upcoming appointment soon after discharge. No change in med regimen at this time.   Interval History:  Resting in bed comfortably this morning.  Abdominal pain has resolved and Foley output has returned back to normal after catheter exchange.  Assessment & Plan: * Acute renal failure superimposed on stage 4 chronic kidney disease ([place]) - patient has history of CKD4. Baseline creat ~ 1.8 - 1.9 - patient presents with increase in creat >0.3 mg/dL above baseline, creat increase >1.5x baseline presumed to have occurred within past 7 days PTA - creat 3.26 on admission; etiology is considered due to bladder outlet obstruction from  obstructed Foley, see urinary retention -Continue Foley, underwent exchange with urology on 10/05/2021 - Continue fluids - BMP in a.m.  Urinary retention - Patient has known history of urethral stricture and complicated prostatic urethral anatomy per urology - Foley catheter was exchanged on 10/05/2021 from 16 [place] to an 70 [employer] cath and old from clots were irrigated.  Some concern for possible necrotic tissue as well which is not unexpected in setting of his underlying hx bladder cancer -Catheter now draining well, continue Foley - Outpatient follow-up with urology in approximately 2 weeks  Bladder outlet obstruction - see urinary retention   Acid fast bacillus - hx bladder cancer with BCG injections and gemcitibine - AFB urine culture on 08/18/21 is positive - seen by ID on 08/27/21 and started on rifampin, isoniazid, ethambutol, pyridoxine -Patient has had a gap in treatment, unknown reason at this time but has been restarted approximately 2 to 3 weeks ago - LFTs mildly elevated.  Normal imaging of liver on CT and ultrasound.  Hepatitis panel negative - Discussed with ID, continue current regimen, no changes at this time - Patient has scheduled outpatient follow-up with ID on 10/08/2021   Decubitus ulcer of sacral region, stage 2 ([place]) - Appreciate assistance from [place] - Foam dressing to sacrum, change Q 3 days or PRN soiling  H. pylori infection - Recent diagnosis of Helicobacter pylori infection based on biopsies obtained during EGD performed 10/27  - course of abx completed prior to admission - outpt GI follow up  Type 2 diabetes mellitus with stage 4 chronic kidney  disease, with long-term current use of insulin ([facility]) - Last A1c 12.5% on 09/23/2021 - Continuous SSI and CBGs  GERD (gastroesophageal reflux disease) - Continue PPI    Old records reviewed in assessment of this patient  Antimicrobials: Rifampin, ethambutol, INH  DVT prophylaxis: HSQ  Code  Status:   Code Status: Full Code  Disposition Plan: Status is: Inpatient  Remains inpatient appropriate because: Ongoing treatment of above  Risk of unplanned readmission score:     Objective: Blood pressure 112/62, pulse 94, temperature 98.5 F (36.9 C), temperature source Oral, resp. rate 16, SpO2 100 %.  Examination: Physical Exam Constitutional:      General: Adam Chung is not in acute distress.    Appearance: Normal appearance.  HENT:     Head: Normocephalic and atraumatic.     Mouth/Throat:     Mouth: Mucous membranes are moist.  Eyes:     Extraocular Movements: Extraocular movements intact.  Cardiovascular:     Rate and Rhythm: Normal rate and regular rhythm.  Pulmonary:     Effort: Pulmonary effort is normal. No respiratory distress.     Breath sounds: Normal breath sounds.  Abdominal:     General: Bowel sounds are normal. There is no distension.     Palpations: Abdomen is soft.     Tenderness: There is no abdominal tenderness.  Genitourinary:    Comments: Foley catheter in place with clear yellow urine Musculoskeletal:        General: Normal range of motion.     Cervical back: Normal range of motion and neck supple.  Skin:    General: Skin is warm and dry.     Comments: Sacral ulcer appreciated  Neurological:     General: No focal deficit present.     Mental Status: Adam Chung is alert.  Psychiatric:        Mood and Affect: Mood normal.        Behavior: Behavior normal.     Consultants:  Urology  Procedures:  Foley cath exchange, 11/7  Data Reviewed: I have personally reviewed labs and imaging studies Creatinine 2.39    LOS: 0 days  Time spent: Greater than 50% of the 35 minute visit was spent in counseling/coordination of care for the patient as laid out in the A&P.   Dwyane Dee, MD [facility] 10/06/2021, 3:41 PM

## 2021-10-06 NOTE — Assessment & Plan Note (Addendum)
Continue PPI ?

## 2021-10-06 NOTE — ED Notes (Signed)
Mepalex pads applied to sacrum and buttock

## 2021-10-06 NOTE — Assessment & Plan Note (Addendum)
-   patient has history of CKD4. Baseline creat ~ 1.8 - 1.9 - patient presents with increase in creat >0.3 mg/dL above baseline, creat increase >1.5x baseline presumed to have occurred within past 7 days PTA - creat 3.26 on admission; etiology is considered due to bladder outlet obstruction from obstructed Foley, see urinary retention -Continue Foley, underwent exchange with urology on 10/05/2021 - renal function baseline at discharge

## 2021-10-06 NOTE — Assessment & Plan Note (Addendum)
-   Recent diagnosis of Helicobacter pylori infection based on biopsies obtained during EGD performed 10/27  - course of abx completed prior to admission

## 2021-10-06 NOTE — Assessment & Plan Note (Signed)
-   Appreciate assistance from [place] - Foam dressing to sacrum, change Q 3 days or PRN soiling

## 2021-10-06 NOTE — Assessment & Plan Note (Addendum)
-   Last A1c 12.5% on 09/23/2021 - Continuous SSI and CBGs

## 2021-10-06 NOTE — Assessment & Plan Note (Addendum)
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  Some concern for possible necrotic tissue as well which is not unexpected in setting of his underlying hx bladder cancer -Catheter now draining well, continue Foley - Outpatient follow-up with urology in approximately 2 weeks (Dr. Tresa Moore)

## 2021-10-06 NOTE — Progress Notes (Signed)
Subjective: Adam Chung is doing well since foley replacement and clot evac.  He has no pain and the foley is draining well.  Cr is declining.  ROS:  Review of Systems  Constitutional:  Negative for fever.  Gastrointestinal:  Negative for abdominal pain.   Anti-infectives: Anti-infectives (From admission, onward)    Start     Dose/Rate Route Frequency Ordered Stop   10/06/21 1000  ethambutol (MYAMBUTOL) tablet 1,200 mg        1,200 mg Oral Daily 10/06/21 0226     10/06/21 1000  isoniazid (NYDRAZID) tablet 300 mg        300 mg Oral Daily 10/06/21 0226     10/06/21 1000  rifampin (RIFADIN) capsule 600 mg        600 mg Oral Daily 10/06/21 0226         Current Facility-Administered Medications  Medication Dose Route Frequency Provider Last Rate Last Admin   (feeding supplement) PROSource Plus liquid 30 mL  30 mL Oral BID BM Shalhoub, Sherryll Burger, MD       acetaminophen (TYLENOL) tablet 650 mg  650 mg Oral Q6H PRN Shalhoub, Sherryll Burger, MD       Or   acetaminophen (TYLENOL) suppository 650 mg  650 mg Rectal Q6H PRN Shalhoub, Sherryll Burger, MD       ethambutol (MYAMBUTOL) tablet 1,200 mg  1,200 mg Oral Daily Shalhoub, Sherryll Burger, MD       heparin injection 5,000 Units  5,000 Units Subcutaneous Q8H Shalhoub, Sherryll Burger, MD   5,000 Units at 10/06/21 0449   insulin aspart (novoLOG) injection 0-15 Units  0-15 Units Subcutaneous TID AC & HS Shalhoub, Sherryll Burger, MD       insulin aspart (novoLOG) injection 4 Units  4 Units Subcutaneous TID WC Shalhoub, Sherryll Burger, MD       insulin glargine-yfgn (SEMGLEE) injection 10 Units  10 Units Subcutaneous Daily Shalhoub, Sherryll Burger, MD       isoniazid (NYDRAZID) tablet 300 mg  300 mg Oral Daily Shalhoub, Sherryll Burger, MD       lactated ringers infusion   Intravenous Continuous Shalhoub, Sherryll Burger, MD 100 mL/hr at 10/06/21 0246 New Bag at 10/06/21 0246   multivitamin with minerals tablet 1 tablet  1 tablet Oral Daily Shalhoub, Sherryll Burger, MD       ondansetron [facility]) tablet 4 mg   4 mg Oral Q6H PRN Shalhoub, Sherryll Burger, MD       Or   ondansetron (ZOFRAN) injection 4 mg  4 mg Intravenous Q6H PRN Shalhoub, Sherryll Burger, MD       pantoprazole (PROTONIX) EC tablet 40 mg  40 mg Oral Daily Shalhoub, Sherryll Burger, MD       polyethylene glycol (MIRALAX / GLYCOLAX) packet 17 g  17 g Oral Daily PRN Shalhoub, Sherryll Burger, MD       protein supplement (ENSURE MAX) liquid  11 oz Oral BID Shalhoub, Sherryll Burger, MD       pyridOXINE (VITAMIN B-6) tablet 50 mg  50 mg Oral Daily Shalhoub, Sherryll Burger, MD       rifampin (RIFADIN) capsule 600 mg  600 mg Oral Daily Shalhoub, Sherryll Burger, MD       senna-docusate (Senokot-S) tablet 1 tablet  1 tablet Oral BID Shalhoub, Sherryll Burger, MD       tamsulosin (FLOMAX) capsule 0.4 mg  0.4 mg Oral Daily Shalhoub, Sherryll Burger, MD       Current Outpatient Medications  Medication Sig  Dispense Refill   acetaminophen (TYLENOL) 325 MG tablet Take 2 tablets (650 mg total) by mouth every 6 (six) hours as needed for mild pain (or Fever >/= 101).     Cholecalciferol 250 MCG (10000 UT) TABS Take 3,000 Units by mouth daily.     Ensure Max Protein (ENSURE MAX PROTEIN) LIQD Take 330 mLs (11 oz total) by mouth 2 (two) times daily.     ethambutol (MYAMBUTOL) 400 MG tablet Take 3 tablets (1,200 mg total) by mouth daily. 90 tablet 1   glimepiride (AMARYL) 4 MG tablet Take 4 mg by mouth daily with breakfast.     insulin aspart (NOVOLOG) 100 UNIT/ML FlexPen Inject 4 Units into the skin 3 (three) times daily with meals. Give only if patient consumes > 50% of meals. 15 mL 11   insulin glargine-yfgn (SEMGLEE) 100 UNIT/ML injection Inject 0.1 mLs (10 Units total) into the skin daily. 10 mL 0   isoniazid (NYDRAZID) 300 MG tablet Take 1 tablet (300 mg total) by mouth daily. 30 tablet 2   Multiple Vitamin (MULTIVITAMIN WITH MINERALS) TABS tablet Take 1 tablet by mouth daily.     Nutritional Supplements (,FEEDING SUPPLEMENT, PROSOURCE PLUS) liquid Take 30 mLs by mouth 2 (two) times daily between meals.      ondansetron (ZOFRAN ODT) 8 MG disintegrating tablet Take 1 tablet (8 mg total) by mouth every 8 (eight) hours as needed for nausea or vomiting. 30 tablet 2   pyridOXINE (B-6) 50 MG tablet Take 1 tablet (50 mg total) by mouth daily. 30 tablet 2   rifampin (RIFADIN) 300 MG capsule Take 2 capsules (600 mg total) by mouth daily. 60 capsule 2   senna-docusate (SENOKOT-S) 8.6-50 MG tablet Take 1 tablet by mouth 2 (two) times daily.     tamsulosin (FLOMAX) 0.4 MG CAPS capsule Take 0.4 mg by mouth daily.     pantoprazole (PROTONIX) 40 MG tablet Take 1 tablet (40 mg total) by mouth 2 (two) times daily for 6 days. 12 tablet 0     Objective: Vital signs in last 24 hours: Temp:  [97.7 F (36.5 C)] 97.7 F (36.5 C) (11/07 1833) Pulse Rate:  [81-108] 81 (11/08 0430) Resp:  [10-19] 10 (11/08 0430) BP: (99-123)/(57-71) 107/61 (11/08 0430) SpO2:  [98 %-100 %] 100 % (11/08 0430)  Intake/Output from previous day: 11/07 0701 - 11/08 0700 In: -  Out: 1100 [Urine:1100] Intake/Output this shift: No intake/output data recorded.   Physical Exam Vitals reviewed.  Constitutional:      Appearance: Normal appearance.  Genitourinary:    Comments: Urine clear in foley tubing.  Neurological:     Mental Status: He is alert.    Lab Results:  Recent Labs    10/05/21 1845 10/06/21 0444  WBC 7.2 5.2  HGB 11.5* 9.9*  HCT 35.5* 30.6*  PLT 289 213   BMET Recent Labs    10/05/21 1845 10/06/21 0444  NA 132* 134*  K 4.6 4.1  CL 98 107  CO2 21* 21*  GLUCOSE 235* 166*  BUN 59* 48*  CREATININE 3.26* 2.39*  CALCIUM 8.6* 7.7*   PT/INR No results for input(s): LABPROT, INR in the last 72 hours. ABG No results for input(s): PHART, HCO3 in the last 72 hours.  Invalid input(s): PCO2, PO2  Studies/Results: No results found.   Assessment and Plan: Obstructed foley with recurrent retention and AKI.   The foley continues to drain and his Cr has declined to 2.39.    Chronic BCG cystitis.  LOS: 0 days    Irine Seal 10/06/2021 [phone] Patient ID: Adam Chung, male   DOB: 05/22/1951, 70 y.o.   MRN: [id number]

## 2021-10-06 NOTE — Consult Note (Addendum)
[facility] Nurse Consult Note: Reason for Consult: Consult requested for sacrum and buttocks. Performed remotely after assistance of the bedside nurse from the ED who performed an assessment of the affected areas and discussed via phone call. Wound type: Sacrum with red moist Stage 2 pressure injury.  Few scattered red moist partial thickness wounds to surrounding areas on bilat buttocks. Pressure Injury POA: Yes Dressing procedure/placement/frequency: Topical treatment orders provided for bedside nurses to perform as follows to protect and promote healing: Foam dressing to sacrum, change Q 3 days or PRN soiling. Please re-consult if further assistance is needed.  Thank-you,  Julien Girt MSN, [place], [place], [place], [place]

## 2021-10-06 NOTE — H&P (Signed)
History and Physical    Adam Chung KMM:[id number] DOB: [birth year] DOA: 10/05/2021  PCP: Rusty Aus, MD  [facility]   Chief Complaint:  Chief Complaint  Patient presents with   clogged urinary cath      HPI:    70 year old male with past medical history of insulin-dependent diabetes mellitus type 2 (hgba1c 12.5% 08/2021), bladder cancer receiving BCG injections, recent diagnosis of H. pylori gastritis (EGD 10/27), hypertension, chronic kidney disease stage IV (baseline Cr 1.89 09/30/2021), anemia of chronic disease, urinary retention with history of urethral stricture due to remote history of incised prostatic abscess, stage I sacral pressure wound who presents to [facility] long hospital emergency department via EMS from his facility [facility] with complaints of lower abdominal discomfort and a nondraining Foley catheter.  Patient explains that his lower abdominal pain has been ongoing approximately the past 4 days.  Lower abdominal pain is sharp in quality, nonradiating, worse with movement and associated with some weakness.  Patient is also complaining of some associated poor appetite.  Patient denies fevers.  Patient denies nausea or vomiting.  As the days progressed patient's lower abdominal pain continued to worsen and was associated with a nondraining Foley catheter.  Symptoms continue to worsen until patient's facility contacted EMS who promptly came to evaluate the patient and brought him to [facility] emergency department for evaluation.  Upon evaluation in the emergency department patient was identified to be suffering from a concurrent acute kidney injury with creatinine of 3.26.  Emergency department staff attempted to irrigate the Foley catheter which was unsuccessful.  ER provider then contacted Dr. Jeffie Pollock with urology who promptly came to evaluate the patient.  Urology was able to successfully exchange the 16 French catheter for an  52 French catheter and was able to irrigate out several old blood clots.  Immediately after this was completed greater than 750 cc of urine output occurred.  Due to acute kidney injury however the hospitalist group has been called to assess the patient for admission to the hospital.   Review of Systems:   Review of Systems  Gastrointestinal:  Positive for abdominal pain.  Neurological:  Positive for weakness.  All other systems reviewed and are negative.  Past Medical History:  Diagnosis Date   Cancer [facility])    bladder   Diabetes mellitus without complication [facility])     Past Surgical History:  Procedure Laterality Date   BIOPSY  09/24/2021   Procedure: BIOPSY;  Surgeon: Wilford Corner, MD;  Location: [facility] ENDOSCOPY;  Service: Endoscopy;;   BLADDER SURGERY     CYSTOSCOPY N/A 08/09/2021   Procedure: CYSTOSCOPY WITH INSERTION OF FOLEY CATHETER;  Surgeon: Janith Lima, MD;  Location: [facility] ORS;  Service: Urology;  Laterality: N/A;   ESOPHAGOGASTRODUODENOSCOPY (EGD) WITH PROPOFOL N/A 09/24/2021   Procedure: ESOPHAGOGASTRODUODENOSCOPY (EGD) WITH PROPOFOL;  Surgeon: Wilford Corner, MD;  Location: [facility] ENDOSCOPY;  Service: Endoscopy;  Laterality: N/A;   fatty tumor excision     TRANSURETHRAL RESECTION OF PROSTATE N/A 08/09/2021   Procedure: TRANSURETHRAL RESECTION OF THE PROSTATE (TURP);  Surgeon: Janith Lima, MD;  Location: [facility] ORS;  Service: Urology;  Laterality: N/A;     reports that he has quit smoking. He has never used smokeless tobacco. He reports that he does not currently use alcohol. No history on file for drug use.  Allergies  Allergen Reactions   Apple Anaphylaxis   Penicillins Swelling    Swelling of lip per  patient    Family History  Problem Relation Age of Onset   Heart disease Neg Hx      Prior to Admission medications   Medication Sig Start Date End Date Taking? Authorizing Provider  acetaminophen (TYLENOL) 325 MG tablet Take 2 tablets (650 mg total) by  mouth every 6 (six) hours as needed for mild pain (or Fever >/= 101). 09/29/21   Eugenie Filler, MD  Cholecalciferol 50 MCG (2000 UT) TABS Take 3,000 Units by mouth daily.    [provider]  Ensure Max Protein (ENSURE MAX PROTEIN) LIQD Take 330 mLs (11 oz total) by mouth 2 (two) times daily. 09/29/21   Eugenie Filler, MD  ethambutol (MYAMBUTOL) 400 MG tablet Take 3 tablets (1,200 mg total) by mouth daily. 08/24/21   Tsosie Billing, MD  glimepiride (AMARYL) 4 MG tablet Take 4 mg by mouth daily with breakfast.    [provider]  insulin aspart (NOVOLOG) 100 UNIT/ML FlexPen Inject 4 Units into the skin 3 (three) times daily with meals. Give only if patient consumes > 50% of meals. 09/29/21   Eugenie Filler, MD  insulin glargine-yfgn (SEMGLEE) 100 UNIT/ML injection Inject 0.1 mLs (10 Units total) into the skin daily. 09/30/21   Eugenie Filler, MD  isoniazid (NYDRAZID) 300 MG tablet Take 1 tablet (300 mg total) by mouth daily. 08/24/21   Tsosie Billing, MD  Multiple Vitamin (MULTIVITAMIN WITH MINERALS) TABS tablet Take 1 tablet by mouth daily. 09/30/21   Eugenie Filler, MD  Nutritional Supplements (,FEEDING SUPPLEMENT, PROSOURCE PLUS) liquid Take 30 mLs by mouth 2 (two) times daily between meals. 09/30/21   Eugenie Filler, MD  ondansetron (ZOFRAN ODT) 8 MG disintegrating tablet Take 1 tablet (8 mg total) by mouth every 8 (eight) hours as needed for nausea or vomiting. 09/10/21   Kuppelweiser, Cassie L, RPH-CPP  pantoprazole (PROTONIX) 40 MG tablet Take 1 tablet (40 mg total) by mouth 2 (two) times daily for 6 days. 09/29/21 10/05/21  Eugenie Filler, MD  pyridOXINE (B-6) 50 MG tablet Take 1 tablet (50 mg total) by mouth daily. 08/24/21   Tsosie Billing, MD  rifampin (RIFADIN) 300 MG capsule Take 2 capsules (600 mg total) by mouth daily. 08/24/21   Tsosie Billing, MD  senna-docusate (SENOKOT-S) 8.6-50 MG tablet Take 1 tablet by mouth 2 (two)  times daily. 09/29/21   Eugenie Filler, MD  tamsulosin (FLOMAX) 0.4 MG CAPS capsule Take 0.4 mg by mouth daily.    [provider]    Physical Exam: Vitals:   10/05/21 2330 10/06/21 0000 10/06/21 0030 10/06/21 0100  BP: 105/60 102/61 103/61 102/61  Pulse: 92 91 88 85  Resp: 13 16 19 12   Temp:      TempSrc:      SpO2: 99% 99% 99% 99%    Constitutional: Awake alert and oriented x3, no associated distress.   Skin: no rashes, no lesions, good skin turgor noted. Eyes: Pupils are equally reactive to light.  No evidence of scleral icterus or conjunctival pallor.  ENMT: Moist mucous membranes noted.  Posterior pharynx clear of any exudate or lesions.   Neck: normal, supple, no masses, no thyromegaly.  No evidence of jugular venous distension.   Respiratory: clear to auscultation bilaterally, no wheezing, no crackles. Normal respiratory effort. No accessory muscle use.  Cardiovascular: Regular rate and rhythm, no murmurs / rubs / gallops. No extremity edema. 2+ pedal pulses. No carotid bruits.  Chest:   Nontender without crepitus  or deformity.   Back:   Nontender without crepitus or deformity. Abdomen: Mild suprapubic tenderness, otherwise Abdomen is soft and nontender.  No evidence of intra-abdominal masses.  Positive bowel sounds noted in all quadrants.   Musculoskeletal: No joint deformity upper and lower extremities. Good ROM, no contractures. Normal muscle tone.  GU   Foley catheter in place  Neurologic: CN 2-12 grossly intact. Sensation intact.  Patient moving all 4 extremities spontaneously.  Patient is following all commands.  Patient is responsive to verbal stimuli.   Psychiatric: Patient exhibits normal mood with appropriate affect.  Patient seems to possess insight as to their current situation.     Labs on Admission: I have personally reviewed following labs and imaging studies -   CBC: Recent Labs  Lab 09/29/21 0727 09/30/21 0420 10/05/21 1845  WBC 6.3 5.0 7.2   HGB 9.0* 8.7* 11.5*  HCT 27.5* 26.9* 35.5*  MCV 89.0 90.9 89.9  PLT 170 147* 419   Basic Metabolic Panel: Recent Labs  Lab 09/29/21 0727 09/30/21 0420 10/05/21 1845  NA 136 133* 132*  K 3.9 3.5 4.6  CL 105 102 98  CO2 24 23 21*  GLUCOSE 156* 127* 235*  BUN 41* 33* 59*  CREATININE 1.89* 1.89* 3.26*  CALCIUM 7.6* 7.4* 8.6*  MG  --  1.5*  --   PHOS  --  3.5  --    GFR: Estimated Creatinine Clearance: 21.3 mL/min (A) (by C-G formula based on SCr of 3.26 mg/dL (H)). Liver Function Tests: Recent Labs  Lab 09/30/21 0420  ALBUMIN 2.1*   No results for input(s): LIPASE, AMYLASE in the last 168 hours. No results for input(s): AMMONIA in the last 168 hours. Coagulation Profile: No results for input(s): INR, PROTIME in the last 168 hours. Cardiac Enzymes: No results for input(s): CKTOTAL, CKMB, CKMBINDEX, TROPONINI in the last 168 hours. BNP (last 3 results) No results for input(s): PROBNP in the last 8760 hours. HbA1C: No results for input(s): HGBA1C in the last 72 hours. CBG: Recent Labs  Lab 09/29/21 0738 09/29/21 1135 09/29/21 1653 09/29/21 2154 09/30/21 0732  GLUCAP 153* 115* 244* 148* 116*   Lipid Profile: No results for input(s): CHOL, HDL, LDLCALC, TRIG, CHOLHDL, LDLDIRECT in the last 72 hours. Thyroid Function Tests: No results for input(s): TSH, T4TOTAL, FREET4, T3FREE, THYROIDAB in the last 72 hours. Anemia Panel: No results for input(s): VITAMINB12, FOLATE, FERRITIN, TIBC, IRON, RETICCTPCT in the last 72 hours. Urine analysis:    Component Value Date/Time   COLORURINE AMBER (A) 10/05/2021 2330   APPEARANCEUR HAZY (A) 10/05/2021 2330   APPEARANCEUR Cloudy (A) 08/18/2021 0908   LABSPEC 1.013 10/05/2021 2330   PHURINE 5.0 10/05/2021 2330   GLUCOSEU 50 (A) 10/05/2021 2330   HGBUR LARGE (A) 10/05/2021 2330   BILIRUBINUR NEGATIVE 10/05/2021 2330   BILIRUBINUR Negative 08/18/2021 0908   KETONESUR 5 (A) 10/05/2021 2330   PROTEINUR 30 (A) 10/05/2021 2330    NITRITE NEGATIVE 10/05/2021 2330   LEUKOCYTESUR LARGE (A) 10/05/2021 2330    Radiological Exams on Admission - Personally Reviewed: No results found.  EKG: Personally reviewed.  Rhythm is NSR  with heart rate of 85bpm.  No dynamic ST segment changes appreciated.  Assessment/Plan  * Acute renal failure superimposed on stage 4 chronic kidney disease ([place]) Patient exhibiting evidence of acute kidney injury secondary to postobstructive uropathy from nonfunctioning Foley catheter Creatinine is currently 3.26 an increase compared to baseline of 1.89 Hydrating patient with intravenous isotonic fluids. 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 Jeffie Pollock with urology successfully on this presentation Foley catheter has now been irrigated successfully and is currently actively draining Indwelling Foley catheter remain in place with outpatient follow-up with urology in 2 weeks  BCG cystitis Urinalysis is profoundly abnormal, likely secondary to ongoing BCG cystitis  Patient is being managed as an outpatient with RIPE therapy (Rifampin, Isoniazid, Pyridoxine, Ethambutol) which will be continued here   Type 2 diabetes mellitus with stage 4 chronic kidney disease, with long-term current use of insulin ([place]) Patient been placed on Accu-Cheks before every meal and nightly with sliding scale insulin Continue home regimen of basal/bolus insulin therapy Hold oral hypoglycemics Recent hemoglobin A1c performed in October is suggestive of extremely poor control at 12.5% Diabetic Diet   H. pylori infection Recent  diagnosis of Helicobacter pylori infection based on biopsies obtained during EGD performed 10/27  Patient has just completed his course of therapy yesterday Outpatient GI follow-up  GERD (gastroesophageal reflux disease) Continuing home regimen of daily PPI therapy.       Code Status:  Full code  code status decision has been confirmed with: patient Family Communication: daughter has been updated on plan of care via phone conversation   Status is: Observation  The patient remains OBS appropriate and will d/c before 2 midnights.       Vernelle Emerald MD [facility] Pager [phone]  If 7PM-7AM, please contact night-coverage [email or web address] Use universal [place] password for that web site. If you do not have the password, please call the hospital operator.  10/06/2021, 2:27 AM

## 2021-10-06 NOTE — Assessment & Plan Note (Addendum)
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

## 2021-10-07 ENCOUNTER — Inpatient Hospital Stay (HOSPITAL_COMMUNITY): Payer: Medicare Other

## 2021-10-07 ENCOUNTER — Other Ambulatory Visit: Payer: Self-pay

## 2021-10-07 DIAGNOSIS — N320 Bladder-neck obstruction: Secondary | ICD-10-CM

## 2021-10-07 DIAGNOSIS — R7989 Other specified abnormal findings of blood chemistry: Secondary | ICD-10-CM

## 2021-10-07 DIAGNOSIS — A319 Mycobacterial infection, unspecified: Secondary | ICD-10-CM

## 2021-10-07 DIAGNOSIS — L89152 Pressure ulcer of sacral region, stage 2: Secondary | ICD-10-CM

## 2021-10-07 DIAGNOSIS — K719 Toxic liver disease, unspecified: Secondary | ICD-10-CM | POA: Insufficient documentation

## 2021-10-07 LAB — CBC WITH DIFFERENTIAL/PLATELET
Abs Immature Granulocytes: 0.02 10*3/uL (ref 0.00–0.07)
Basophils Absolute: 0.1 10*3/uL (ref 0.0–0.1)
Basophils Relative: 1 %
Eosinophils Absolute: 0 10*3/uL (ref 0.0–0.5)
Eosinophils Relative: 0 %
HCT: 32.5 % — ABNORMAL LOW (ref 39.0–52.0)
Hemoglobin: 10.4 g/dL — ABNORMAL LOW (ref 13.0–17.0)
Immature Granulocytes: 0 %
Lymphocytes Relative: 16 %
Lymphs Abs: 0.8 10*3/uL (ref 0.7–4.0)
MCH: 29.6 pg (ref 26.0–34.0)
MCHC: 32 g/dL (ref 30.0–36.0)
MCV: 92.6 fL (ref 80.0–100.0)
Monocytes Absolute: 0.5 10*3/uL (ref 0.1–1.0)
Monocytes Relative: 10 %
Neutro Abs: 3.6 10*3/uL (ref 1.7–7.7)
Neutrophils Relative %: 73 %
Platelets: 251 10*3/uL (ref 150–400)
RBC: 3.51 MIL/uL — ABNORMAL LOW (ref 4.22–5.81)
RDW: 15.6 % — ABNORMAL HIGH (ref 11.5–15.5)
WBC: 5 10*3/uL (ref 4.0–10.5)
nRBC: 0 % (ref 0.0–0.2)

## 2021-10-07 LAB — COMPREHENSIVE METABOLIC PANEL
ALT: 122 U/L — ABNORMAL HIGH (ref 0–44)
AST: 160 U/L — ABNORMAL HIGH (ref 15–41)
Albumin: 2.2 g/dL — ABNORMAL LOW (ref 3.5–5.0)
Alkaline Phosphatase: 134 U/L — ABNORMAL HIGH (ref 38–126)
Anion gap: 6 (ref 5–15)
BUN: 37 mg/dL — ABNORMAL HIGH (ref 8–23)
CO2: 24 mmol/L (ref 22–32)
Calcium: 8.6 mg/dL — ABNORMAL LOW (ref 8.9–10.3)
Chloride: 108 mmol/L (ref 98–111)
Creatinine, Ser: 1.85 mg/dL — ABNORMAL HIGH (ref 0.61–1.24)
GFR, Estimated: 39 mL/min — ABNORMAL LOW (ref >60)
Glucose, Bld: 55 mg/dL — ABNORMAL LOW (ref 70–99)
Potassium: 4.3 mmol/L (ref 3.5–5.1)
Sodium: 138 mmol/L (ref 135–145)
Total Bilirubin: 2.8 mg/dL — ABNORMAL HIGH (ref 0.3–1.2)
Total Protein: 5.9 g/dL — ABNORMAL LOW (ref 6.5–8.1)

## 2021-10-07 LAB — GLUCOSE, CAPILLARY
Glucose-Capillary: 51 mg/dL — ABNORMAL LOW (ref 70–99)
Glucose-Capillary: 87 mg/dL (ref 70–99)
Glucose-Capillary: 69 mg/dL — ABNORMAL LOW (ref 70–99)
Glucose-Capillary: 115 mg/dL — ABNORMAL HIGH (ref 70–99)
Glucose-Capillary: 84 mg/dL (ref 70–99)

## 2021-10-07 LAB — MAGNESIUM: Magnesium: 1.6 mg/dL — ABNORMAL LOW (ref 1.7–2.4)

## 2021-10-07 MED ORDER — GADOBUTROL 1 MMOL/ML IV SOLN
8.0000 mL | Freq: Once | INTRAVENOUS | Status: AC | PRN
  Administered 2021-10-07: 8 mL via INTRAVENOUS

## 2021-10-07 MED ORDER — ENSURE ENLIVE PO LIQD
237.0000 mL | Freq: Two times a day (BID) | ORAL | Status: DC
  Administered 2021-10-08 – 2021-10-10 (×4): 237 mL via ORAL

## 2021-10-07 MED ORDER — PROSOURCE PLUS PO LIQD
30.0000 mL | Freq: Two times a day (BID) | ORAL | Status: DC
  Administered 2021-10-07: 30 mL via ORAL
  Filled 2021-10-07: qty 30

## 2021-10-07 MED ORDER — MAGNESIUM SULFATE 2 GM/50ML IV SOLN
2.0000 g | Freq: Once | INTRAVENOUS | Status: AC
  Administered 2021-10-07: 2 g via INTRAVENOUS
  Filled 2021-10-07: qty 50

## 2021-10-07 MED ORDER — ENSURE MAX PROTEIN PO LIQD
11.0000 [oz_av] | Freq: Every day | ORAL | Status: DC
Start: 2021-10-08 — End: 2021-10-10
  Filled 2021-10-07 (×3): qty 330

## 2021-10-07 MED ORDER — MORPHINE SULFATE (PF) 2 MG/ML IV SOLN
2.0000 mg | INTRAVENOUS | Status: DC | PRN
  Administered 2021-10-07 – 2021-10-09 (×4): 2 mg via INTRAVENOUS
  Filled 2021-10-07 (×4): qty 1

## 2021-10-07 MED ORDER — WHITE PETROLATUM EX OINT
TOPICAL_OINTMENT | CUTANEOUS | Status: DC | PRN
  Filled 2021-10-07: qty 5

## 2021-10-07 MED ORDER — SURGILUBE EX GEL: CUTANEOUS | Status: DC | PRN

## 2021-10-07 MED ORDER — OXYBUTYNIN CHLORIDE 5 MG PO TABS
5.0000 mg | ORAL_TABLET | Freq: Three times a day (TID) | ORAL | Status: DC | PRN
  Administered 2021-10-07 – 2021-10-09 (×6): 5 mg via ORAL
  Filled 2021-10-07 (×6): qty 1

## 2021-10-07 NOTE — Assessment & Plan Note (Addendum)
-   LFTs continue to uptrend. Hx CCY and no significant CBD dilation on RUQ u/s or sludge appreciated  - MRCP also negative for obstruction or gross abnormality (performed 11/9) - suspicion remains DILI from anti-TB meds (rifampin, INH, ethambutol). Have consulted GI and also in agreement meds seem to be culprit - for now meds on hold; next steps are further workup to rule out autoimmune process - plan is holding treatment for 1 week then resuming as long as LFTs have improved or returned near normal. Then needs weekly lab check; as long as LFTs less than 400 after resuming treatment then considered okay to continue per GI

## 2021-10-07 NOTE — Progress Notes (Signed)
Progress Note    Adam Chung   DHR:[id number]  DOB: 1951-10-20  DOA: 10/05/2021     1 PCP: Rusty Aus, MD  Initial CC: abd pain, decreased foley output   Hospital Course: Mr. Wehner is a 70 yo male with PMH DMII, bladder cancer s/p BCG and gemcitabine, recent H. Pylori (s/p completed abx), CKDIV, ACD, urinary retention who presented with lower abdominal pain and decreased urinary output from his Foley. He was evaluated by urology and underwent Foley exchange which was found to have old firm clots and possible underlying necrotic tissue. In association with bladder outlet obstruction, he had acute on chronic worsening of his renal function.  This improved after Foley exchange, bladder drainage, and IV fluids.  He has been followed by ID since late September 2022 due to AFB infection involving the urine associated with recent BCG injections and gemcitabine.  He was started on rifampin, isoniazid, ethambutol, pyridoxine.  He was continued on this treatment for approximately 3 weeks then had stopped for 1 to 2 weeks and was restarted for the last 2 to 3 weeks.  On admission, he was noted to have mildly elevated LFTs.  Right upper quadrant ultrasound showed normal-appearing liver.  Hepatitis panel also negative. Case was discussed with ID and he has close upcoming appointment soon after discharge. No change in med regimen at this time.   Interval History:  Patient complaining of pain at Foley insertion and concerned over reobstruction.  He is wanting urology to reevaluate.  Nursing was able to flush Foley bedside this morning. He is also asking for pain medication due to the ongoing pain.  Assessment & Plan: * Acute renal failure superimposed on stage 4 chronic kidney disease ([place]) - patient has history of CKD4. Baseline creat ~ 1.8 - 1.9 - patient presents with increase in creat >0.3 mg/dL above baseline, creat increase >1.5x baseline presumed to have occurred within past 7 days  PTA - creat 3.26 on admission; etiology is considered due to bladder outlet obstruction from obstructed Foley, see urinary retention -Continue Foley, underwent exchange with urology on 10/05/2021 - Continue fluids - BMP in a.m. -Renal function continues to improve  Elevated LFTs - LFTs continue to uptrend. Hx CCY and no significant CBD dilation on RUQ u/s or sludge appreciated  - suspicion still is from AFB treatment but will further workup per ID rec's - ID recommending MRCP to better evaluate and consideration of GI involvement - for now, holding rifampin, INH, and ethambutol per ID  Urinary retention - Patient has known history of urethral stricture and complicated prostatic urethral anatomy per urology - Foley catheter was exchanged on 10/05/2021 from 16 [place] to an 87 [employer] cath and old from clots were irrigated.  Some concern for possible necrotic tissue as well which is not unexpected in setting of his underlying hx bladder cancer -Catheter now draining well, continue Foley - patient endorsed pain with foley today as if cath clogged again; nursing able to flush but sediment noted but not unexpected, patient still insistent on discussing with urology; foley draining well; PRN meds for oxybutynin and vaseline placed. 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 pylori infection - Recent diagnosis of Helicobacter pylori infection based on biopsies obtained during EGD performed 10/27  - course of abx completed prior to admission - outpt GI follow up  Type 2 diabetes mellitus with stage 4 chronic kidney disease, with long-term current use of insulin (HCC) - Last A1c 12.5% on 09/23/2021 - Continuous SSI and CBGs  GERD (gastroesophageal reflux disease) - Continue PPI    Old records reviewed in assessment of this patient  Antimicrobials:  Rifampin, ethambutol, INH (now on hold as of 11/9)  DVT prophylaxis: HSQ  Code Status:   Code Status: Full Code  Disposition Plan: Status is: Inpatient  Remains inpatient appropriate because: Ongoing treatment of above  Risk of unplanned readmission score: Unplanned Admission- Pilot do not use: 35.26   Objective: Blood pressure 136/78, pulse 83, temperature 97.7 F (36.5 C), temperature source Axillary, resp. rate 20, SpO2 100 %.  Examination: Physical Exam Constitutional:      General: He is not in acute distress.    Appearance: Normal appearance.  HENT:     Head: Normocephalic and atraumatic.     Mouth/Throat:     Mouth: Mucous membranes are moist.  Eyes:     Extraocular Movements: Extraocular movements intact.  Cardiovascular:     Rate and Rhythm: Normal rate and regular rhythm.  Pulmonary:     Effort: Pulmonary effort is normal. No respiratory distress.     Breath sounds: Normal breath sounds.  Abdominal:     General: Bowel sounds are normal. There is no distension.     Palpations: Abdomen is soft.     Tenderness: There is no abdominal tenderness.  Genitourinary:    Comments: Foley catheter in place with sedimented urine noted in bag  Musculoskeletal:        General: Normal range of motion.     Cervical back: Normal range of motion and neck supple.  Skin:     General: Skin is warm and dry.     Comments: Sacral ulcer appreciated  Neurological:     General: No focal deficit present.     Mental Status: He is alert.  Psychiatric:        Mood and Affect: Mood normal.        Behavior: Behavior normal.     Consultants:  Urology  Procedures:  Foley cath exchange, 11/7  Data Reviewed: I have personally reviewed labs and imaging studies Creatinine 2.39    LOS: 1 day  Time spent: Greater than 50% of the 35 minute visit was spent in counseling/coordination of care for the patient as laid out in the A&P.   Dwyane Dee, MD [facility] 10/07/2021, 5:08 PM

## 2021-10-07 NOTE — Treatment Plan (Signed)
Urology called to assess patient's catheter.  On assessment, patient with clear yellow urine in foley tubing and 950cc urine out today. Using a toomey syringe patient's catheter was gently hand irrigated without difficulty. Urine remained clear yellow and there was no debris or clot obstructing the catheter. Catheter is draining well at present. Patient describes pain at tip of penis and occasional cramping. I suspect he may be having bladder spasms and irritation at his meatus. Orders placed for prn oxybutynin and lubricating jelly to tip of penis.

## 2021-10-07 NOTE — TOC Progression Note (Signed)
Transition of Care [facility]) - Progression Note    Patient Details  Name: Adam Chung MRN: [id number] Date of Birth: 29-Dec-1950  Transition of Care [facility]) CM/SW Contact  Purcell Mouton, RN Phone Number: 10/07/2021, 4:04 PM  Clinical Narrative:    Pt is from [facility]. TOC will continue to follow for discharge.    Expected Discharge Plan: [place] Barriers to Discharge: No Barriers Identified  Expected Discharge Plan and Services Expected Discharge Plan: [place] arrangements for the past 2 months: [place] Expected Discharge Date:  (unknown)                                     Social Determinants of Health (SDOH) Interventions    Readmission Risk Interventions No flowsheet data found.

## 2021-10-07 NOTE — Progress Notes (Signed)
Inpatient Diabetes Program Recommendations  AACE/ADA: New Consensus Statement on Inpatient Glycemic Control (2015)  Target Ranges:  Prepandial:   less than 140 mg/dL      Peak postprandial:   less than 180 mg/dL (1-2 hours)      Critically ill patients:  140 - 180 mg/dL   Lab Results  Component Value Date   GLUCAP 87 10/07/2021   HGBA1C 12.5 (H) 09/23/2021    Review of Glycemic Control Results for Adam Chung, Adam Chung (MRN [id number]) as of 10/07/2021 08:46  Ref. Range 10/06/2021 21:44 10/07/2021 07:27 10/07/2021 08:06  Glucose-Capillary Latest Ref Range: 70 - 99 mg/dL 86 51 (L) 87   Diabetes history: Type 2 Dm Outpatient Diabetes medications: Amaryl 4 mg QD, Semglee 10 units QD Current orders for Inpatient glycemic control: Semglee 10 units QD, Novolog 4 units TID, Novolog 0-15 units TID & HS  Inpatient Diabetes Program Recommendations:    Noted FSBG 55 mg/dL. Assuming patient still has Semglee on board. At this time, would recommend discontinuing Semglee and coverage with meals.   Thanks, Bronson Curb, MSN, RNC-OB Diabetes Coordinator [phone] (8a-5p)

## 2021-10-07 NOTE — Consult Note (Addendum)
[facility]    Date of Admission:  10/05/2021   Total days of antibiotics inpatient:  2        Reason for Consult: Elevated LFT in pt taking RIPE    Principal Problem:   Acute renal failure superimposed on stage 4 chronic kidney disease (HCC) Active Problems:   Type 2 diabetes mellitus with stage 4 chronic kidney disease, with long-term current use of insulin (HCC)   Urinary retention   GERD (gastroesophageal reflux disease)   H. 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 Delaine Lame, Infectious Disease).  Presented to the hospital for abdominal pain found to have AKI on CKD and clogged Foley.  ID consulted as LFTs are trending up.   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  Specimen was not sent to [facility] lab as they do not test for Mycobacterium bovis specifically, only tuberculosis complex(included m bovis).  Suspected the bladder necrosis is secondary to BCG infection and patient started on RIPE(rifampin, INH, pyridoxine, ethambutol) - Patient's adherence to therapy appears inconsistent.  He told me he has  missed about 3 days  of meds where as there is documentation that he may have missed about 2 weeks. -Patient has had a cholecystectomy in the past.  Today, LFTs are 160/122/134 and T bili 2.8.  We will look for other causes of elevated LFTs in this patient including MRCP to evaluate biliary tree.    Recommendations:  -Obtain CT AP for further characterization of possible obstruction.   -Obtain MRCP, engage  GI -Hold RIPE therapy as pt appears jaundice and AST is 4X above normal(cutoff for INH is AST 3-5X above normal) -Etiology of hepatotoxicity may be multifactorial including acute illness/INH+ethambutol, biliary source.   Microbiology:  Antibiotics: RIPE since 08/20/21-present  Cultures: 11/7 Urine Cx NGTD  HPI: Adam Chung is a 70 y.o. male with diabetes mellitus, hypertension, bladder cancer for which she was followed at [facility] and treated with local BCG then gemcitabine in [id number], complicated by necrotic bladder found to have AFB on bladder biopsy subsequently started on RIPE therapy in September, 2022(necrosis was felt to be a complication of BCG) presented from [facility] facility for lower abdominal pain and nondraining Foley.  He reported abdominal pain has been going on for the past 4 days.  He had associated poor appetite.  On arrival vitals stable, creatinine 3.26(baseline 1.89).  Patient admitted for AKI in the setting of CKD. ID consult as patient's LFTs were rising.  Patient reports that he has been adherent to therapy except for missing 3 days worth of doses.  He reports that that antibiotic regimen decreases his appetite.  Review of Systems: Review of Systems  Constitutional: Negative.   HENT: Negative.    Eyes: Negative.   Respiratory: Negative.    Cardiovascular: Negative.   Gastrointestinal:  Positive for abdominal pain.  Genitourinary: Negative.   Musculoskeletal: Negative.   Skin: Negative.   Endo/Heme/Allergies: Negative.   Psychiatric/Behavioral: Negative.     Past Medical History:  Diagnosis Date   Cancer ([place])  bladder   Diabetes mellitus without complication (HCC)     Social History   Tobacco Use   Smoking status: Former   Smokeless tobacco: Never  Substance Use Topics   Alcohol use: Not Currently    Family History  Problem Relation Age of Onset   Heart disease Neg Hx    Scheduled Meds:  (feeding supplement) PROSource Plus  30 mL Oral  BID BM   Chlorhexidine Gluconate Cloth  6 each Topical Daily   ethambutol  1,200 mg Oral Daily   feeding supplement  237 mL Oral BID BM   heparin  5,000 Units Subcutaneous Q8H   insulin aspart  0-15 Units Subcutaneous TID AC & HS   isoniazid  300 mg Oral Daily   multivitamin with minerals  1 tablet Oral Daily   pantoprazole  40 mg Oral Daily   [START ON 10/08/2021] Ensure Max Protein  11 oz Oral Daily   pyridOXINE  50 mg Oral Daily   rifampin  600 mg Oral Daily   senna-docusate  1 tablet Oral BID   tamsulosin  0.4 mg Oral Daily   Continuous Infusions:  lactated ringers 100 mL/hr at 10/06/21 0835   PRN Meds:.acetaminophen **OR** acetaminophen, morphine injection, ondansetron **OR** ondansetron (ZOFRAN) IV, oxybutynin, polyethylene glycol, white petrolatum Allergies  Allergen Reactions   Apple Anaphylaxis   Penicillins Swelling    Swelling of lip per patient    OBJECTIVE: Blood pressure 136/78, pulse 83, temperature 97.7 F (36.5 C), temperature source Axillary, resp. rate 20, SpO2 100 %.  Physical Exam Constitutional:      General: He is not in acute distress.    Appearance: He is normal weight. He is not toxic-appearing.  HENT:     Head: Normocephalic and atraumatic.     Right Ear: External ear normal.     Left Ear: External ear normal.     Nose: No congestion or rhinorrhea.     Mouth/Throat:     Mouth: Mucous membranes are moist.     Pharynx: Oropharynx is clear.  Eyes:     Extraocular Movements: Extraocular movements intact.     Pupils: Pupils are equal, round, and reactive to light.  Cardiovascular:     Rate and Rhythm: Normal rate and regular rhythm.     Heart sounds: No murmur heard.   No friction rub. No gallop.  Pulmonary:     Effort: Pulmonary effort is normal.     Breath sounds: Normal breath sounds.  Abdominal:     General: Abdomen is flat. Bowel sounds are normal.     Palpations: Abdomen is soft.  Musculoskeletal:        General: No swelling. Normal  range of motion.     Cervical back: Normal range of motion and neck supple.  Skin:    General: Skin is warm and dry.     Comments: jaundiced  Neurological:     General: No focal deficit present.     Mental Status: He is oriented to person, place, and time.  Psychiatric:        Mood and Affect: Mood normal.    Lab Results Lab Results  Component Value Date   WBC 5.0 10/07/2021   HGB 10.4 (L) 10/07/2021   HCT 32.5 (L) 10/07/2021   MCV 92.6 10/07/2021   PLT 251 10/07/2021    Lab Results  Component Value Date   CREATININE 1.85 (H) 10/07/2021   BUN 37 (H) 10/07/2021   NA 138 10/07/2021   K  4.3 10/07/2021   CL 108 10/07/2021   CO2 24 10/07/2021    Lab Results  Component Value Date   ALT 122 (H) 10/07/2021   AST 160 (H) 10/07/2021   ALKPHOS 134 (H) 10/07/2021   BILITOT 2.8 (H) 10/07/2021       Laurice Record, [facility] for Infectious Disease [facility] 10/07/2021, 3:46 PM

## 2021-10-07 NOTE — Progress Notes (Signed)
Initial Nutrition Assessment  DOCUMENTATION CODES:   Non-severe (moderate) malnutrition in context of chronic illness  INTERVENTION:  - continue 30 ml Prosource Plus BID, each supplement provides 100 kcal and 15 grams protein.  - will decrease Ensure Max from BID to once/day, each supplement provides 350 kcal and 20 grams of protein. - will order Ensure Enlive BID, each supplement provides 350 kcal and 20 grams of protein.  - weigh patient today. - diet liberalized from Heart Healthy/Carb Modified to Regular (cleared by MD).   NUTRITION DIAGNOSIS:   Moderate Malnutrition related to chronic illness, cancer and cancer related treatments as evidenced by moderate fat depletion, moderate muscle depletion, severe muscle depletion.  GOAL:   Patient will meet greater than or equal to 90% of their needs  MONITOR:   PO intake, Supplement acceptance, Labs, Weight trends  REASON FOR ASSESSMENT:   Consult Assessment of nutrition requirement/status  ASSESSMENT:   70 year-old male with medical history of type 2 DM, bladder cancer s/p BCG and gemcitabine, recent H. Pylori diagnosis (s/p completed abx), stage 4 CKD, ACD, and urinary retention with Foley. He presented to the ED with lower abdominal pain and decreased urinary output from Foley. He was seen by Urology and underwent Foley exchange which was found to have old, firm clots and possible underlying necrotic tissue.  No meal intakes documented since admission. Patient has accepted both packets of Prosource Plus offered to him so far.  Patient seen late morning and at that time was laying in bed with no visitors present at the time of RD visit.  Patient was seen by another [facility] RD on 09/24/21, the date on which patient had EGD. Notes since that time indicate finding of h.pylori at the time of EGD and that patient has completed a course of abx for this.  Patient shares that after d/c he went to [facility] for rehab. He reports  that since last admission he has had a very poor appetite and that he has not been consuming solid foods d/t lack of taste or undesirable taste and that the texture of solid food is bothersome/undesirable to him. He has only been consuming liquids. He has been using Ensure (or similar) to take oral medications.   He enjoys fruit and this is one of the things he misses most about solid foods. He was unable to make smoothies while at rehab. Encouraged patient to have his daughter or any other visitors bring him things that he feel he would enjoy and tolerate well. Encouraged him to continue to consume oral nutrition supplements as able throughout each day.   He does report weakness and that he would feel lightheaded at rehab if he stood up too quickly.   Patient has not been weighed since 09/24/21.   Per notes: - ARF on stage 4 CKD - s/p Foley exchange on 11/7 - urinary retention, bladder outlet obstruction   Labs reviewed; CBGs: 51, 87, 69 mg/dl, BUN: 55 mg/dl, creatinine: 1.85 mg/dl, Ca: 8.6 mg/dl, Mg: 1.6 mg/dl, Alk Phos elevated, LFTs elevated, GFR: 39 ml/min.   Medications reviewed; sliding scale novolog, 1 tablet multivitamin with minerals/day, 40 mg oral protonix/day, 50 mg vitamin B6/day, 1 tablet senokot BID.   IVF; LR @ 100 ml/hr.     NUTRITION - FOCUSED PHYSICAL EXAM:  Flowsheet Row Most Recent Value  Orbital Region Severe depletion  Upper Arm Region Moderate depletion  Thoracic and Lumbar Region Unable to assess  Buccal Region Moderate depletion  [employer]  Moderate depletion  Clavicle Bone Region Severe depletion  Clavicle and Acromion Bone Region Moderate depletion  Scapular Bone Region Unable to assess  Dorsal Hand Mild depletion  Patellar Region Severe depletion  Anterior Thigh Region Severe depletion  Posterior Calf Region Moderate depletion  Edema (RD Assessment) Mild  [BLE]  Hair Reviewed  Eyes Reviewed  Mouth Reviewed  Skin Reviewed  Nails Reviewed        Diet Order:   Diet Order             Diet regular Room service appropriate? Yes; Fluid consistency: Thin  Diet effective now                   EDUCATION NEEDS:   Education needs have been addressed  Skin:  Skin Integrity Issues:: Stage II, Stage I Stage I: L anterior knee Stage II: coccyx  Last BM:  PTA/unknown  Height:   Ht Readings from Last 1 Encounters:  09/24/21 $RemoveB'5\' 11"'[id number]$  (1.803 m)    Weight:   Wt Readings from Last 1 Encounters:  09/24/21 71.4 kg    Estimated Nutritional Needs:  Kcal:  2150-2400 kcal Protein:  105-120 grams Fluid:  >/= 2.3 L/day     Jarome Matin, MS, RD, LDN, CNSC Inpatient Clinical Dietitian RD pager # available in [facility]  After hours/weekend pager # available in [facility]

## 2021-10-08 ENCOUNTER — Ambulatory Visit: Payer: Medicare Other | Admitting: Infectious Diseases

## 2021-10-08 ENCOUNTER — Telehealth: Payer: Self-pay

## 2021-10-08 DIAGNOSIS — K719 Toxic liver disease, unspecified: Secondary | ICD-10-CM

## 2021-10-08 LAB — COMPREHENSIVE METABOLIC PANEL
ALT: 126 U/L — ABNORMAL HIGH (ref 0–44)
AST: 180 U/L — ABNORMAL HIGH (ref 15–41)
Albumin: 2.3 g/dL — ABNORMAL LOW (ref 3.5–5.0)
Alkaline Phosphatase: 140 U/L — ABNORMAL HIGH (ref 38–126)
Anion gap: 6 (ref 5–15)
BUN: 28 mg/dL — ABNORMAL HIGH (ref 8–23)
CO2: 28 mmol/L (ref 22–32)
Calcium: 8.9 mg/dL (ref 8.9–10.3)
Chloride: 104 mmol/L (ref 98–111)
Creatinine, Ser: 1.79 mg/dL — ABNORMAL HIGH (ref 0.61–1.24)
GFR, Estimated: 40 mL/min — ABNORMAL LOW (ref >60)
Glucose, Bld: 69 mg/dL — ABNORMAL LOW (ref 70–99)
Potassium: 4.5 mmol/L (ref 3.5–5.1)
Sodium: 138 mmol/L (ref 135–145)
Total Bilirubin: 3.2 mg/dL — ABNORMAL HIGH (ref 0.3–1.2)
Total Protein: 5.9 g/dL — ABNORMAL LOW (ref 6.5–8.1)

## 2021-10-08 LAB — CBC WITH DIFFERENTIAL/PLATELET
Abs Immature Granulocytes: 0.01 10*3/uL (ref 0.00–0.07)
Basophils Absolute: 0.1 10*3/uL (ref 0.0–0.1)
Basophils Relative: 1 %
Eosinophils Absolute: 0 10*3/uL (ref 0.0–0.5)
Eosinophils Relative: 0 %
HCT: 33.5 % — ABNORMAL LOW (ref 39.0–52.0)
Hemoglobin: 10.5 g/dL — ABNORMAL LOW (ref 13.0–17.0)
Immature Granulocytes: 0 %
Lymphocytes Relative: 21 %
Lymphs Abs: 0.9 10*3/uL (ref 0.7–4.0)
MCH: 29.2 pg (ref 26.0–34.0)
MCHC: 31.3 g/dL (ref 30.0–36.0)
MCV: 93.3 fL (ref 80.0–100.0)
Monocytes Absolute: 0.4 10*3/uL (ref 0.1–1.0)
Monocytes Relative: 9 %
Neutro Abs: 3.1 10*3/uL (ref 1.7–7.7)
Neutrophils Relative %: 69 %
Platelets: 266 10*3/uL (ref 150–400)
RBC: 3.59 MIL/uL — ABNORMAL LOW (ref 4.22–5.81)
RDW: 15.8 % — ABNORMAL HIGH (ref 11.5–15.5)
WBC: 4.5 10*3/uL (ref 4.0–10.5)
nRBC: 0 % (ref 0.0–0.2)

## 2021-10-08 LAB — GLUCOSE, CAPILLARY
Glucose-Capillary: 61 mg/dL — ABNORMAL LOW (ref 70–99)
Glucose-Capillary: 82 mg/dL (ref 70–99)
Glucose-Capillary: 63 mg/dL — ABNORMAL LOW (ref 70–99)
Glucose-Capillary: 80 mg/dL (ref 70–99)
Glucose-Capillary: 46 mg/dL — ABNORMAL LOW (ref 70–99)
Glucose-Capillary: 223 mg/dL — ABNORMAL HIGH (ref 70–99)
Glucose-Capillary: 243 mg/dL — ABNORMAL HIGH (ref 70–99)

## 2021-10-08 LAB — IRON AND TIBC
Iron: 87 ug/dL (ref 45–182)
Saturation Ratios: 88 % — ABNORMAL HIGH (ref 17.9–39.5)
TIBC: 99 ug/dL — ABNORMAL LOW (ref 250–450)
UIBC: 12 ug/dL

## 2021-10-08 LAB — MAGNESIUM: Magnesium: 2 mg/dL (ref 1.7–2.4)

## 2021-10-08 LAB — FERRITIN: Ferritin: 1393 ng/mL — ABNORMAL HIGH (ref 24–336)

## 2021-10-08 MED ORDER — SODIUM CHLORIDE 0.9 % IV SOLN: INTRAVENOUS | Status: DC

## 2021-10-08 MED ORDER — DEXTROSE-NACL 5-0.9 % IV SOLN: INTRAVENOUS | Status: DC

## 2021-10-08 MED ORDER — DEXTROSE 50 % IV SOLN
25.0000 g | INTRAVENOUS | Status: DC | PRN
  Administered 2021-10-08: 25 g via INTRAVENOUS
  Filled 2021-10-08: qty 50

## 2021-10-08 NOTE — Consult Note (Signed)
Referring Provider: Dr. Dwyane Dee Primary Care Physician:  Rusty Aus, MD Primary Gastroenterologist:  Althia Forts  Reason for Consultation:  Elevated Transaminase   HPI: Adam Chung is a 70 y.o. male with significant medical history for CKD stage IIIa, type 2 diabetes, chronic anemia, recent H. pylori, treated with antibiotics, history of bladder cancer on BCG injections , AFB infection.   Patient presented to ER with decreased urinary output from his Foley with necrotic tissue and clots and abdominal pain on 10/05/2021. Infectious disease following for the patient for AFB infection involving the urine associate with recent BCG infections and gemcitabine. Was started on rifampin, isoniazid, ethambutol, pyridoxine year 08/20/2021.  He was continued on this treatment for approximately 3 weeks then had stopped for 1 to 2 weeks and was restarted for the last 2 to 3 weeks.  Also last visit patient had endoscopy that showed gastritis, pathology revealed H. pylori and patient was started on Quadruple therapy. On admission patient had mildly elevated liver functions, had normal right upper quadrant ultrasound and hepatitis panel negative so GI was consulted.  AST 180 (160) (112) ALT 126 (122) (111) Alkphos 140 (134) TBili 3.2 (2.8) (1.7) Normal LFTs 08/07/2021.  No leukocytosis  Patient denies itching, abdominal pain, melena, hematochezia, clay colored stool. Patient has had dark urine but has been on rifampin and have clots in urine. Had 1 episode of dry heaving last week during rehab, denies vomiting, reflux. Daughter Angie on the phone during the visit states occasionally will have yellowing of eyes but goes away, has noticed last 2 weeks. Patient denies any over-the-counter supplement use, rare tylenol use. Patient denies family history of liver disease, autoimmune. Patient denies NSAID use, alcohol history, remote smoking history 35 years ago. Denies any previous history of IV drug  use, tattoos.  AB [place] 10/07/21 Normal sonographic appearance of the liver. Prior cholecystectomy.  No evidence of biliary obstruction. Partially visualized moderate to severe hydronephrosis of the right kidney, as seen on multiple prior CTs.  MRCP 10/07/2021 Status post cholecystectomy. No intrahepatic or extrahepatic ductal dilatation. No choledocholithiasis is seen. No suspicious/enhancing hepatic lesions. Moderate right and mild left hydronephrosis, chronic.  Past Medical History:  Diagnosis Date   Cancer [facility])    bladder   Diabetes mellitus without complication [facility])     Past Surgical History:  Procedure Laterality Date   BIOPSY  09/24/2021   Procedure: BIOPSY;  Surgeon: Wilford Corner, MD;  Location: [facility];  Service: Endoscopy;;   BLADDER SURGERY     CYSTOSCOPY N/A 08/09/2021   Procedure: CYSTOSCOPY WITH INSERTION OF FOLEY CATHETER;  Surgeon: Janith Lima, MD;  Location: [facility] ORS;  Service: Urology;  Laterality: N/A;   ESOPHAGOGASTRODUODENOSCOPY (EGD) WITH PROPOFOL N/A 09/24/2021   Procedure: ESOPHAGOGASTRODUODENOSCOPY (EGD) WITH PROPOFOL;  Surgeon: Wilford Corner, MD;  Location: [facility];  Service: Endoscopy;  Laterality: N/A;   fatty tumor excision     TRANSURETHRAL RESECTION OF PROSTATE N/A 08/09/2021   Procedure: TRANSURETHRAL RESECTION OF THE PROSTATE (TURP);  Surgeon: Janith Lima, MD;  Location: [facility] ORS;  Service: Urology;  Laterality: N/A;    Prior to Admission medications   Medication Sig Start Date End Date Taking? Authorizing Provider  Cholecalciferol 50 MCG (2000 UT) TABS Take 3,000 Units by mouth daily.   Yes [provider]  ciprofloxacin (CIPRO) 500 MG tablet Take 1 tablet (500 mg total) by mouth 2 (two) times daily. 08/20/21  Yes Tsosie Billing, MD  ethambutol (MYAMBUTOL) 400 MG tablet Take 3  tablets (1,200 mg total) by mouth daily. 08/24/21  Yes Tsosie Billing, MD  glimepiride (AMARYL) 4 MG tablet Take 4 mg by  mouth daily with breakfast.   Yes [provider]  isoniazid (NYDRAZID) 300 MG tablet Take 1 tablet (300 mg total) by mouth daily. 08/24/21  Yes Tsosie Billing, MD  ondansetron (ZOFRAN ODT) 8 MG disintegrating tablet Take 1 tablet (8 mg total) by mouth every 8 (eight) hours as needed for nausea or vomiting. 09/10/21  Yes Kuppelweiser, Cassie L, RPH-CPP  pyridOXINE (B-6) 50 MG tablet Take 1 tablet (50 mg total) by mouth daily. 08/24/21  Yes Tsosie Billing, MD  rifampin (RIFADIN) 300 MG capsule Take 2 capsules (600 mg total) by mouth daily. 08/24/21  Yes Tsosie Billing, MD  tamsulosin (FLOMAX) 0.4 MG CAPS capsule Take 0.4 mg by mouth daily.   Yes [provider]    Scheduled Meds:  (feeding supplement) PROSource Plus  30 mL Oral BID BM   Chlorhexidine Gluconate Cloth  6 each Topical Daily   feeding supplement  237 mL Oral BID BM   heparin  5,000 Units Subcutaneous Q8H   insulin aspart  0-15 Units Subcutaneous TID AC & HS   multivitamin with minerals  1 tablet Oral Daily   pantoprazole  40 mg Oral Daily   Ensure Max Protein  11 oz Oral Daily   senna-docusate  1 tablet Oral BID   tamsulosin  0.4 mg Oral Daily   Continuous Infusions:   PRN Meds:.acetaminophen **OR** acetaminophen, morphine injection, ondansetron **OR** ondansetron (ZOFRAN) IV, oxybutynin, polyethylene glycol, white petrolatum  Allergies as of 10/05/2021 - Review Complete 10/05/2021  Allergen Reaction Noted   Apple Anaphylaxis 07/28/2018   Penicillins Swelling 07/28/2018    Family History  Problem Relation Age of Onset   Heart disease Neg Hx     Social History   Socioeconomic History   Marital status: Married    Spouse name: Not on file   Number of children: Not on file   Years of education: Not on file   Highest education level: Not on file  Occupational History   Not on file  Tobacco Use   Smoking status: Former   Smokeless tobacco: Never  Substance and Sexual  Activity   Alcohol use: Not Currently   Drug use: Not on file   Sexual activity: Not on file  Other Topics Concern   Not on file  Social History Narrative   Not on file   Social Determinants of Health   Financial Resource Strain: Not on file  Food Insecurity: Not on file  Transportation Needs: Not on file  Physical Activity: Not on file  Stress: Not on file  Social Connections: Not on file  Intimate Partner Violence: Not on file    Review of Systems:  Review of Systems  Constitutional:  Positive for malaise/fatigue and weight loss. Negative for chills and fever.  HENT:  Negative for sore throat.   Respiratory:  Negative for cough and shortness of breath.   Cardiovascular:  Negative for chest pain and leg swelling.  Gastrointestinal:  Positive for constipation (Last BM yesterday with straining.). Negative for abdominal pain, blood in stool, diarrhea, heartburn, melena, nausea and vomiting.  Genitourinary:  Positive for dysuria, frequency and hematuria.  Musculoskeletal:  Negative for falls.  Skin:  Negative for itching and rash.  Neurological:  Negative for loss of consciousness.  Psychiatric/Behavioral:  Negative for memory loss.     Physical Exam: Vital signs: Vitals:   10/07/21  2221 10/08/21 0548  BP: 107/69 124/69  Pulse: 97 84  Resp: 18 19  Temp: 98.2 F (36.8 C) 98.3 F (36.8 C)  SpO2: 100% 99%     Physical Exam  General:   Alert, thin, ill appearing male, lying comfortably in bed, in NAD, mild sclera icterus Heart:  Regular rate and rhythm; no murmurs Pulm: Clear anteriorly; no wheezing Abdomen:  Soft, Flat AB, skin exam with right lower quadrant peg tube scar, Normal bowel sounds.  no  tenderness  on exam . Without guarding and Without rebound, without hepatomegaly. Extremities:  Without edema. Neurologic:  Alert and  oriented x4;  grossly normal neurologically. Psych:  Alert and cooperative. Normal mood and affect.  GI:  Lab Results: Recent Labs     10/06/21 0444 10/07/21 0402 10/08/21 0358  WBC 5.2 5.0 4.5  HGB 9.9* 10.4* 10.5*  HCT 30.6* 32.5* 33.5*  PLT 213 251 266    BMET Recent Labs    10/06/21 0444 10/07/21 0402 10/08/21 0358  NA 134* 138 138  K 4.1 4.3 4.5  CL 107 108 104  CO2 21* 24 28  GLUCOSE 166* 55* 69*  BUN 48* 37* 28*  CREATININE 2.39* 1.85* 1.79*  CALCIUM 7.7* 8.6* 8.9    LFT Recent Labs    10/08/21 0358  PROT 5.9*  ALBUMIN 2.3*  AST 180*  ALT 126*  ALKPHOS 140*  BILITOT 3.2*   PT/INR Recent Labs    10/06/21 0830  LABPROT 17.8*  INR 1.5*    Studies/Results: MR 3D Recon At Scanner  Result Date: 10/07/2021 CLINICAL DATA:  Right upper quadrant abdominal pain, jaundice, elevated LFTs EXAM: MRI ABDOMEN WITHOUT AND WITH CONTRAST (INCLUDING MRCP) TECHNIQUE: Multiplanar multisequence MR imaging of the abdomen was performed both before and after the administration of intravenous contrast. Heavily T2-weighted images of the biliary and pancreatic ducts were obtained, and three-dimensional MRCP images were rendered by post processing. CONTRAST:  34mL GADAVIST GADOBUTROL 1 MMOL/ML IV SOLN COMPARISON:  CT abdomen/pelvis dated 09/20/2021. Right upper quadrant ultrasound dated 10/06/2021. FINDINGS: Lower chest: Lung bases are clear. Hepatobiliary: Subcentimeter cyst in the anterior right hepatic dome. No suspicious/enhancing hepatic lesions. No hepatic steatosis. Status post cholecystectomy. No hepatic or extrahepatic ductal dilatation. Common duct measures 6 mm. No choledocholithiasis is seen. Pancreas:  Within normal limits. Spleen:  Within normal limits. Adrenals/Urinary Tract:  Adrenal glands are within normal limits. Moderate right and mild left hydronephrosis, chronic. Kidneys otherwise within normal limits. Stomach/Bowel: Prominent gastric folds along the posterior gastric cardia (series 22/image 23), without convincing mass. Visualized bowel is grossly unremarkable. Vascular/Lymphatic:  No evidence of  abdominal aortic aneurysm. No suspicious abdominal lymphadenopathy. Other:  No abdominal ascites. Musculoskeletal: No focal osseous lesions. IMPRESSION: Status post cholecystectomy. No intrahepatic or extrahepatic ductal dilatation. No choledocholithiasis is seen. No suspicious/enhancing hepatic lesions. Moderate right and mild left hydronephrosis, chronic. Electronically Signed   By: Julian Hy M.D.   On: 10/07/2021 23:59   MR ABDOMEN MRCP W WO CONTAST  Result Date: 10/07/2021 CLINICAL DATA:  Right upper quadrant abdominal pain, jaundice, elevated LFTs EXAM: MRI ABDOMEN WITHOUT AND WITH CONTRAST (INCLUDING MRCP) TECHNIQUE: Multiplanar multisequence MR imaging of the abdomen was performed both before and after the administration of intravenous contrast. Heavily T2-weighted images of the biliary and pancreatic ducts were obtained, and three-dimensional MRCP images were rendered by post processing. CONTRAST:  25mL GADAVIST GADOBUTROL 1 MMOL/ML IV SOLN COMPARISON:  CT abdomen/pelvis dated 09/20/2021. Right upper quadrant ultrasound dated 10/06/2021. FINDINGS: Lower  chest: Lung bases are clear. Hepatobiliary: Subcentimeter cyst in the anterior right hepatic dome. No suspicious/enhancing hepatic lesions. No hepatic steatosis. Status post cholecystectomy. No hepatic or extrahepatic ductal dilatation. Common duct measures 6 mm. No choledocholithiasis is seen. Pancreas:  Within normal limits. Spleen:  Within normal limits. Adrenals/Urinary Tract:  Adrenal glands are within normal limits. Moderate right and mild left hydronephrosis, chronic. Kidneys otherwise within normal limits. Stomach/Bowel: Prominent gastric folds along the posterior gastric cardia (series 22/image 23), without convincing mass. Visualized bowel is grossly unremarkable. Vascular/Lymphatic:  No evidence of abdominal aortic aneurysm. No suspicious abdominal lymphadenopathy. Other:  No abdominal ascites. Musculoskeletal: No focal osseous lesions.  IMPRESSION: Status post cholecystectomy. No intrahepatic or extrahepatic ductal dilatation. No choledocholithiasis is seen. No suspicious/enhancing hepatic lesions. Moderate right and mild left hydronephrosis, chronic. Electronically Signed   By: Julian Hy M.D.   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  Negative acute hepatitis panel.   Normal AB [place] and MRCP without dilatation or evidence of cholecystitis.  We will also proceed with AMA, ANA, and ASMA, alpha antitrypsin, iron, ferritin to rule out etiologies of elevated liver function however most likely DILI due to rifampin most likely.   Continue supportive care.    LOS: 2 days   Vladimir Crofts  PA-C 10/08/2021, 9:27 AM  Contact #  [phone]

## 2021-10-08 NOTE — Progress Notes (Signed)
[facility] for Infectious Disease  Date of Admission:  10/05/2021   Total days of antibiotics 2  Principal Problem:   Acute renal failure superimposed on stage 4 chronic kidney disease (HCC) Active Problems:   Type 2 diabetes mellitus with stage 4 chronic kidney disease, with long-term current use of insulin (HCC)   Urinary retention   GERD (gastroesophageal reflux disease)   H. 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 Delaine Lame, Infectious Disease).  Presented to the hospital for abdominal pain found to have AKI on CKD and clogged Foley.  ID consulted as LFTs are trending up.     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  Specimen was not sent to [facility] lab as they do not test for Mycobacterium bovis specifically, only tuberculosis complex(included m bovis).  Suspected the bladder necrosis is secondary to BCG infection and patient started on RIPE(rifampin, INH, pyridoxine, ethambutol) - Acute hep panel negative on 11/8 -Daughter reports pt has missed about a week of RIPE therapy in October as he was between hospitalizations.  - 11/9, LFTs are 160/122/134 and T bili 2.8 which continue to rise.  RIPE held(last dose 11/9).  MRCP did not show biliary dilation. GI consulted plan on further work-up for elevated LFTs.    Recommendations:  -GI consulted, will follow updates -Hold RIPE therapy as pt appears jaundice and AST is 4X above normal(cutoff for INH is AST 3-5X above normal). Once GI work-up complete will  assess need to change regimen to avoid hepatotoxicity. Of the meds in anti-tb regimen the most likely culprits leading to DILI are INH and rifampin.    Microbiology  Antibiotics: RIPE since 08/20/21-11/9   Cultures: 11/7 Urine Cx NGTD   SUBJECTIVE: Resting in bed. I spoke to pt's daughter over the phone. She noted that pt has been jaundiced for the week prior to admission.   Review of Systems: Review of Systems  Constitutional:  Negative for chills and fever.  HENT: Negative.    Eyes: Negative.   Respiratory: Negative.    Cardiovascular: Negative.   Gastrointestinal: Negative.        Decreased appetitie  Genitourinary: Negative.   Musculoskeletal: Negative.   Skin: Negative.     Scheduled Meds:  (feeding supplement) PROSource Plus  30 mL Oral BID BM   Chlorhexidine Gluconate Cloth  6 each Topical Daily   feeding supplement  237 mL Oral BID BM   heparin  5,000 Units Subcutaneous Q8H   insulin aspart  0-15 Units Subcutaneous TID AC & HS   multivitamin with minerals  1 tablet Oral Daily   pantoprazole  40 mg Oral Daily   Ensure Max Protein  11 oz Oral Daily   senna-docusate  1 tablet Oral BID   tamsulosin  0.4 mg Oral Daily   Continuous Infusions: PRN Meds:.acetaminophen **OR** acetaminophen, morphine injection, ondansetron **OR** ondansetron (ZOFRAN) IV, oxybutynin, polyethylene glycol, white petrolatum Allergies  Allergen Reactions   Apple Anaphylaxis   Penicillins Swelling    Swelling  of lip per patient    OBJECTIVE: Vitals:   10/07/21 0330 10/07/21 1135 10/07/21 2221 10/08/21 0548  BP: 126/67 136/78 107/69 124/69  Pulse: 92 83 97 84  Resp: 20 20 18 19   Temp: 98.2 F (36.8 C) 97.7 F (36.5 C) 98.2 F (36.8 C) 98.3 F (36.8 C)  TempSrc: Oral Axillary Oral Oral  SpO2: 98% 100% 100% 99%   There is no height or weight on file to calculate BMI.  Physical Exam Constitutional:      General: He is not in acute distress.    Appearance: He is normal weight. He  is not toxic-appearing.  HENT:     Head: Normocephalic and atraumatic.     Right Ear: External ear normal.     Left Ear: External ear normal.     Nose: No congestion or rhinorrhea.     Mouth/Throat:     Mouth: Mucous membranes are moist.     Pharynx: Oropharynx is clear.  Eyes:     General: Scleral icterus present.     Extraocular Movements: Extraocular movements intact.     Pupils: Pupils are equal, round, and reactive to light.  Cardiovascular:     Rate and Rhythm: Normal rate and regular rhythm.     Heart sounds: No murmur heard.   No friction rub. No gallop.  Pulmonary:     Effort: Pulmonary effort is normal.     Breath sounds: Normal breath sounds.  Abdominal:     General: Abdomen is flat. Bowel sounds are normal.     Palpations: Abdomen is soft.  Musculoskeletal:        General: No swelling. Normal range of motion.     Cervical back: Normal range of motion and neck supple.  Skin:    General: Skin is warm and dry.     Coloration: Skin is jaundiced.  Neurological:     General: No focal deficit present.     Mental Status: He is oriented to person, place, and time.  Psychiatric:        Mood and Affect: Mood normal.      Lab Results Lab Results  Component Value Date   WBC 4.5 10/08/2021   HGB 10.5 (L) 10/08/2021   HCT 33.5 (L) 10/08/2021   MCV 93.3 10/08/2021   PLT 266 10/08/2021    Lab Results  Component Value Date   CREATININE 1.79 (H) 10/08/2021   BUN 28 (H) 10/08/2021   NA 138 10/08/2021   K 4.5 10/08/2021   CL 104 10/08/2021   CO2 28 10/08/2021    Lab Results  Component Value Date   ALT 126 (H) 10/08/2021   AST 180 (H) 10/08/2021   ALKPHOS 140 (H) 10/08/2021   BILITOT 3.2 (H) 10/08/2021        Laurice Record, [place] [facility] Infectious [facility] 10/08/2021, 11:48 AM

## 2021-10-08 NOTE — Plan of Care (Signed)
  Problem: Education: Goal: Knowledge of General Education information will improve Description: Including pain rating scale, medication(s)/side effects and non-pharmacologic comfort measures Outcome: Progressing   Problem: Health Behavior/Discharge Planning: Goal: Ability to manage health-related needs will improve Outcome: Progressing   Problem: Clinical Measurements: Goal: Ability to maintain clinical measurements within normal limits will improve Outcome: Progressing Goal: Will remain free from infection Outcome: Progressing Goal: Diagnostic test results will improve Outcome: Progressing Goal: Respiratory complications will improve Outcome: Progressing   Problem: Coping: Goal: Level of anxiety will decrease Outcome: Progressing   Problem: Elimination: Goal: Will not experience complications related to urinary retention Outcome: Progressing   Problem: Pain Managment: Goal: General experience of comfort will improve Outcome: Progressing   Problem: Safety: Goal: Ability to remain free from injury will improve Outcome: Progressing   Problem: Nutrition: Goal: Adequate nutrition will be maintained Outcome: Not Progressing   Problem: Elimination: Goal: Will not experience complications related to bowel motility Outcome: Not Progressing

## 2021-10-08 NOTE — Telephone Encounter (Signed)
Rehab left vm that patient needs to have appt cancelled as he is still admitted. They will call back closer to his discharge and reschedule.

## 2021-10-08 NOTE — Progress Notes (Signed)
Progress Note    Adam Chung   DVV:[id number]  DOB: 06-Oct-1951  DOA: 10/05/2021     2 PCP: Rusty Aus, MD  Initial CC: abd pain, decreased foley output   Hospital Course: Adam Chung is a 70 yo male with PMH DMII, bladder cancer s/p BCG and gemcitabine, recent H. Pylori (s/p completed abx), CKDIV, ACD, urinary retention who presented with lower abdominal pain and decreased urinary output from his Foley. He was evaluated by urology and underwent Foley exchange which was found to have old firm clots and possible underlying necrotic tissue. In association with bladder outlet obstruction, he had acute on chronic worsening of his renal function.  This improved after Foley exchange, bladder drainage, and IV fluids.  He has been followed by ID since late September 2022 due to AFB infection involving the urine associated with recent BCG injections and gemcitabine.  He was started on rifampin, isoniazid, ethambutol, pyridoxine.  He was continued on this treatment for approximately 3 weeks then had stopped for 1 to 2 weeks and was restarted for the last 2 to 3 weeks.  On admission, he was noted to have mildly elevated LFTs.  Right upper quadrant ultrasound showed normal-appearing liver.  Hepatitis panel also negative. Case was discussed with ID and he has close upcoming appointment soon after discharge. No change in med regimen at this time.   Interval History:  No events overnight.  Foley catheter draining better this morning.  Reviewed labs and plan with patient along with his daughter on the phone bedside this morning.  Assessment & Plan: * Acute renal failure superimposed on stage 4 chronic kidney disease ([place]) - patient has history of CKD4. Baseline creat ~ 1.8 - 1.9 - patient presents with increase in creat >0.3 mg/dL above baseline, creat increase >1.5x baseline presumed to have occurred within past 7 days PTA - creat 3.26 on admission; etiology is considered due to bladder outlet  obstruction from obstructed Foley, see urinary retention -Continue Foley, underwent exchange with urology on 10/05/2021 - Continue low rate IVF while appetite remains poor - BMP in a.m. -Renal function continues to improve  Drug-induced liver injury - LFTs continue to uptrend. Hx CCY and no significant CBD dilation on RUQ u/s or sludge appreciated  - MRCP also negative for obstruction or gross abnormality (performed 11/9) - suspicion remains DILI from anti-TB meds (rifampin, INH, ethambutol). Have consulted GI and also in agreement meds seem to be culprit - for now meds on hold; next steps are further workup to rule out autoimmune process - if negative workup as anticipated, then still possible to resume regimen if LFTs improve with medication break and/or LFTs do not escalate to 5x ULN when resuming.  Urinary retention - Patient has known history of urethral stricture and complicated prostatic urethral anatomy per urology - Foley catheter was exchanged on 10/05/2021 from 16 [place] to an 46 [employer] cath and old from clots were irrigated.  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 pylori infection - Recent diagnosis of Helicobacter pylori infection based on biopsies obtained during EGD performed 10/27  - course of abx completed prior to admission  Type 2 diabetes mellitus with stage 4 chronic kidney disease, with long-term current use of insulin (HCC) - Last A1c 12.5% on 09/23/2021 - Continuous SSI and CBGs  GERD (gastroesophageal reflux disease) - Continue PPI    Old records reviewed in assessment of this patient  Antimicrobials:  Rifampin, ethambutol, INH (now on hold as of 11/9 which was last dose)  DVT prophylaxis: HSQ  Code Status:   Code Status: Full Code  Disposition Plan: Status is: Inpatient  Remains inpatient appropriate because: Ongoing treatment of above  Risk of unplanned readmission score: Unplanned Admission- Pilot do not use: 31.28   Objective: Blood pressure 126/74, pulse 86, temperature 98.5 F (36.9 C), temperature source Oral, resp. rate 18, SpO2 99 %.  Examination: Physical Exam Constitutional:      General: He is not in acute distress.    Appearance: Normal appearance.  HENT:     Head: Normocephalic and atraumatic.     Mouth/Throat:     Mouth: Mucous membranes are moist.  Eyes:     Extraocular Movements: Extraocular movements intact.  Cardiovascular:     Rate and Rhythm: Normal rate and regular rhythm.  Pulmonary:     Effort: Pulmonary effort is normal. No respiratory distress.     Breath sounds: Normal breath sounds.  Abdominal:     General: Bowel sounds are normal. There is no distension.     Palpations: Abdomen is soft.     Tenderness: There is no abdominal tenderness.  Genitourinary:    Comments: Foley catheter in place with cleared urine Musculoskeletal:        General: Normal range of motion.     Cervical back: Normal range of motion and neck supple.  Skin:    General: Skin is warm and dry.     Comments: Sacral ulcer appreciated  Neurological:     General: No focal deficit  present.     Mental Status: He is alert.  Psychiatric:        Mood and Affect: Mood normal.        Behavior: Behavior normal.     Consultants:  Urology ID GI  Procedures:  Foley cath exchange, 11/7  Data Reviewed: I have personally reviewed labs and imaging studies Creatinine 2.39    LOS: 2 days  Time spent: Greater than 50% of the 35 minute visit was spent in counseling/coordination of care for the patient as laid out in the A&P.   Dwyane Dee, MD [facility] 10/08/2021, 3:54 PM

## 2021-10-09 LAB — COMPREHENSIVE METABOLIC PANEL
ALT: 135 U/L — ABNORMAL HIGH (ref 0–44)
AST: 200 U/L — ABNORMAL HIGH (ref 15–41)
Albumin: 2.2 g/dL — ABNORMAL LOW (ref 3.5–5.0)
Alkaline Phosphatase: 158 U/L — ABNORMAL HIGH (ref 38–126)
Anion gap: 6 (ref 5–15)
BUN: 23 mg/dL (ref 8–23)
CO2: 26 mmol/L (ref 22–32)
Calcium: 8.5 mg/dL — ABNORMAL LOW (ref 8.9–10.3)
Chloride: 107 mmol/L (ref 98–111)
Creatinine, Ser: 1.74 mg/dL — ABNORMAL HIGH (ref 0.61–1.24)
GFR, Estimated: 42 mL/min — ABNORMAL LOW (ref >60)
Glucose, Bld: 105 mg/dL — ABNORMAL HIGH (ref 70–99)
Potassium: 4.3 mmol/L (ref 3.5–5.1)
Sodium: 139 mmol/L (ref 135–145)
Total Bilirubin: 1.8 mg/dL — ABNORMAL HIGH (ref 0.3–1.2)
Total Protein: 5.9 g/dL — ABNORMAL LOW (ref 6.5–8.1)

## 2021-10-09 LAB — CBC WITH DIFFERENTIAL/PLATELET
Abs Immature Granulocytes: 0.01 10*3/uL (ref 0.00–0.07)
Basophils Absolute: 0.1 10*3/uL (ref 0.0–0.1)
Basophils Relative: 2 %
Eosinophils Absolute: 0 10*3/uL (ref 0.0–0.5)
Eosinophils Relative: 0 %
HCT: 33.8 % — ABNORMAL LOW (ref 39.0–52.0)
Hemoglobin: 10.5 g/dL — ABNORMAL LOW (ref 13.0–17.0)
Immature Granulocytes: 0 %
Lymphocytes Relative: 27 %
Lymphs Abs: 1 10*3/uL (ref 0.7–4.0)
MCH: 29 pg (ref 26.0–34.0)
MCHC: 31.1 g/dL (ref 30.0–36.0)
MCV: 93.4 fL (ref 80.0–100.0)
Monocytes Absolute: 0.4 10*3/uL (ref 0.1–1.0)
Monocytes Relative: 11 %
Neutro Abs: 2.3 10*3/uL (ref 1.7–7.7)
Neutrophils Relative %: 60 %
Platelets: 238 10*3/uL (ref 150–400)
RBC: 3.62 MIL/uL — ABNORMAL LOW (ref 4.22–5.81)
RDW: 15.9 % — ABNORMAL HIGH (ref 11.5–15.5)
WBC: 3.8 10*3/uL — ABNORMAL LOW (ref 4.0–10.5)
nRBC: 0 % (ref 0.0–0.2)

## 2021-10-09 LAB — ANA W/REFLEX IF POSITIVE: Anti Nuclear Antibody (ANA): NEGATIVE

## 2021-10-09 LAB — MAGNESIUM: Magnesium: 2.1 mg/dL (ref 1.7–2.4)

## 2021-10-09 LAB — GLUCOSE, CAPILLARY
Glucose-Capillary: 123 mg/dL — ABNORMAL HIGH (ref 70–99)
Glucose-Capillary: 221 mg/dL — ABNORMAL HIGH (ref 70–99)
Glucose-Capillary: 172 mg/dL — ABNORMAL HIGH (ref 70–99)
Glucose-Capillary: 107 mg/dL — ABNORMAL HIGH (ref 70–99)

## 2021-10-09 LAB — CERULOPLASMIN: Ceruloplasmin: 17.9 mg/dL (ref 16.0–31.0)

## 2021-10-09 LAB — PROTIME-INR
INR: 1.2 (ref 0.8–1.2)
Prothrombin Time: 14.8 seconds (ref 11.4–15.2)

## 2021-10-09 LAB — ALPHA-1-ANTITRYPSIN: A-1 Antitrypsin, Ser: 178 mg/dL (ref 101–187)

## 2021-10-09 NOTE — Progress Notes (Signed)
[place] for Infectious Disease  Date of Admission:  10/05/2021   Total days of antibiotics 2  Principal Problem:   Acute renal failure superimposed on stage 4 chronic kidney disease (HCC) Active Problems:   Type 2 diabetes mellitus with stage 4 chronic kidney disease, with long-term current use of insulin (HCC)   Urinary retention   GERD (gastroesophageal reflux disease)   H. 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 Delaine Lame, Infectious Disease).  Presented to the hospital for abdominal pain found to have AKI on CKD and clogged Foley.  ID consulted as LFTs are trending up.     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  Specimen was not sent to [facility] lab as they do not test for Mycobacterium bovis specifically, only tuberculosis complex(included m bovis).  Suspected the bladder necrosis is secondary to BCG infection and patient started on rifampin, INH,ethambutol. - Acute hep panel negative on 11/8 -Daughter reports pt has missed about a week of anti-tb therapy in October as he was between hospitalizations.  -Last dose of tb meds was 11/9(t billi 3.2 and AST 180). Generally, indication to hold anti-tb therapy in setting of hepatotoxicity includes tbli >=3 and/Or LFTs >=5x ULN. The next step is to repeat LFTs in 1 week and if <=2XULN then ok to start meds in a stepwise manner.  Recommendations:  -Hold anti-tb meds for a week -Follow-up with Dr. Delaine Lame, ID in week to  re-start anti-tb meds.   ID will sign off  Microbiology  Antibiotics: RIPE since 08/20/21-11/9   Cultures: 11/7 Urine Cx NGTD   SUBJECTIVE: Resting in bed. He reports feeling about the same today. He denies fever, chills, N,V,D.  Review of Systems: Review of Systems  Constitutional:  Negative for chills and fever.  HENT: Negative.    Eyes: Negative.   Respiratory: Negative.    Cardiovascular: Negative.   Gastrointestinal: Negative.        Decreased appetitie  Genitourinary: Negative.   Musculoskeletal: Negative.   Skin: Negative.     Scheduled Meds:  (feeding supplement) PROSource Plus  30 mL Oral BID BM   Chlorhexidine Gluconate Cloth  6 each Topical Daily   feeding supplement  237 mL Oral BID BM   heparin  5,000 Units Subcutaneous Q8H   insulin aspart  0-15 Units Subcutaneous TID AC & HS   multivitamin with minerals  1 tablet Oral Daily   pantoprazole  40 mg Oral Daily   Ensure Max Protein  11 oz Oral Daily   senna-docusate  1 tablet Oral BID   tamsulosin  0.4 mg Oral Daily   Continuous Infusions:  dextrose 5 % and 0.9% NaCl 75 mL/hr at 10/09/21 0553   PRN Meds:.acetaminophen **OR** acetaminophen, dextrose, morphine injection, ondansetron **OR** ondansetron (ZOFRAN) IV, oxybutynin, polyethylene glycol, white petrolatum Allergies  Allergen Reactions   Apple Anaphylaxis   Penicillins Swelling    Swelling of lip per patient    OBJECTIVE: Vitals:   10/08/21 2234 10/09/21  0615 10/09/21 1221 10/09/21 1951  BP: 128/72 120/66 120/75 126/71  Pulse: 80 79 95 89  Resp: 12 16 18 16   Temp: 99.1 F (37.3 C) 98.5 F (36.9 C) 98.3 F (36.8 C) 99.1 F (37.3 C)  TempSrc: Oral Oral Oral Oral  SpO2: 99% 98% 100% 100%   There is no height or weight on file to calculate BMI.  Physical Exam Constitutional:      General: He is not in acute distress.    Appearance: He is normal weight. He is not toxic-appearing.  HENT:     Head: Normocephalic and atraumatic.     Right  Ear: External ear normal.     Left Ear: External ear normal.     Nose: No congestion or rhinorrhea.     Mouth/Throat:     Mouth: Mucous membranes are moist.     Pharynx: Oropharynx is clear.  Eyes:     General: Scleral icterus present.     Extraocular Movements: Extraocular movements intact.     Pupils: Pupils are equal, round, and reactive to light.  Cardiovascular:     Rate and Rhythm: Normal rate and regular rhythm.     Heart sounds: No murmur heard.   No friction rub. No gallop.  Pulmonary:     Effort: Pulmonary effort is normal.     Breath sounds: Normal breath sounds.  Abdominal:     General: Abdomen is flat. Bowel sounds are normal.     Palpations: Abdomen is soft.  Musculoskeletal:        General: No swelling. Normal range of motion.     Cervical back: Normal range of motion and neck supple.  Skin:    General: Skin is warm and dry.     Coloration: Skin is jaundiced.  Neurological:     General: No focal deficit present.     Mental Status: He is oriented to person, place, and time.  Psychiatric:        Mood and Affect: Mood normal.      Lab Results Lab Results  Component Value Date   WBC 3.8 (L) 10/09/2021   HGB 10.5 (L) 10/09/2021   HCT 33.8 (L) 10/09/2021   MCV 93.4 10/09/2021   PLT 238 10/09/2021    Lab Results  Component Value Date   CREATININE 1.74 (H) 10/09/2021   BUN 23 10/09/2021   NA 139 10/09/2021   K 4.3 10/09/2021   CL 107 10/09/2021   CO2 26 10/09/2021    Lab Results  Component Value Date   ALT 135 (H) 10/09/2021   AST 200 (H) 10/09/2021   ALKPHOS 158 (H) 10/09/2021   BILITOT 1.8 (H) 10/09/2021        Laurice Record, [place] for Infectious Disease [facility] 10/09/2021, 11:53 PM

## 2021-10-09 NOTE — Progress Notes (Cosign Needed)
    Durable Medical Equipment  (From admission, onward)           Start     Ordered   10/09/21 1259  For home use only DME Hospital bed  Once       Question Answer Comment  Length of Need 12 Months   Patient has (list medical condition): H.Pylori, Bladder Cancer, GERD, Decubitus ulcer   The above medical condition requires: Patient requires the ability to reposition frequently   Head must be elevated greater than: 30 degrees   Bed type Semi-electric   Support Surface: Gel Overlay      10/09/21 1308

## 2021-10-09 NOTE — Progress Notes (Signed)
[facility] Progress Note  Adam Chung 70 y.o. 28-Jun-1951  CC: Elevated liver enzyme  Subjective: Patient states he slept well last night.  Has breakfast next to the bed, but more so wanted chicken broth.  Does deny nausea and vomiting.  Denies abdominal pain. No bowel movement since 11/09. Patient asking when he can go home.  ROS :  ROS denies yellowing of skin, eyes, abdominal pain, nausea, vomiting.  Does have decreased appetite.  Objective: Vital signs in last 24 hours: Vitals:   10/08/21 2234 10/09/21 0615  BP: 128/72 120/66  Pulse: 80 79  Resp: 12 16  Temp: 99.1 F (37.3 C) 98.5 F (36.9 C)  SpO2: 99% 98%    Physical Exam: General:   Alert, thin, ill appearing male, lying comfortably in bed, in NAD, mild sclera icterus Abdomen:  Soft, Flat AB, skin exam with right lower quadrant peg tube scar, Normal bowel sounds.  no  tenderness on exam . Without guarding and Without rebound, without hepatomegaly. Neurologic:  Alert and  oriented x4;  grossly normal neurologically. Psych:  Alert and cooperative. Normal mood and affect.    Lab Results: Recent Labs    10/08/21 0358 10/09/21 0356  NA 138 139  K 4.5 4.3  CL 104 107  CO2 28 26  GLUCOSE 69* 105*  BUN 28* 23  CREATININE 1.79* 1.74*  CALCIUM 8.9 8.5*  MG 2.0 2.1   Recent Labs    10/08/21 0358 10/09/21 0356  AST 180* 200*  ALT 126* 135*  ALKPHOS 140* 158*  BILITOT 3.2* 1.8*  PROT 5.9* 5.9*  ALBUMIN 2.3* 2.2*   Recent Labs    10/08/21 0358 10/09/21 0356  WBC 4.5 3.8*  NEUTROABS 3.1 2.3  HGB 10.5* 10.5*  HCT 33.5* 33.8*  MCV 93.3 93.4  PLT 266 238   Recent Labs    10/09/21 0356  LABPROT 14.8  INR 1.2    Lab Results: Results for orders placed or performed during the hospital encounter of 10/05/21 (from the past 48 hour(s))  Glucose, capillary     Status: Abnormal   Collection Time: 10/07/21 11:29 AM  Result Value Ref Range   Glucose-Capillary 69 (L) 70 - 99 mg/dL     Comment: Glucose reference range applies only to samples taken after fasting for at least 8 hours.  Glucose, capillary     Status: Abnormal   Collection Time: 10/07/21  4:38 PM  Result Value Ref Range   Glucose-Capillary 115 (H) 70 - 99 mg/dL    Comment: Glucose reference range applies only to samples taken after fasting for at least 8 hours.  Glucose, capillary     Status: None   Collection Time: 10/07/21 10:13 PM  Result Value Ref Range   Glucose-Capillary 84 70 - 99 mg/dL    Comment: Glucose reference range applies only to samples taken after fasting for at least 8 hours.  Glucose, capillary     Status: Abnormal   Collection Time: 10/08/21  3:53 AM  Result Value Ref Range   Glucose-Capillary 61 (L) 70 - 99 mg/dL    Comment: Glucose reference range applies only to samples taken after fasting for at least 8 hours.  CBC with Differential/Platelet     Status: Abnormal   Collection Time: 10/08/21  3:58 AM  Result Value Ref Range   WBC 4.5 4.0 - 10.5 K/uL   RBC 3.59 (L) 4.22 - 5.81 MIL/uL   Hemoglobin 10.5 (L) 13.0 - 17.0 g/dL   HCT  33.5 (L) 39.0 - 52.0 %   MCV 93.3 80.0 - 100.0 fL   MCH 29.2 26.0 - 34.0 pg   MCHC 31.3 30.0 - 36.0 g/dL   RDW 15.8 (H) 11.5 - 15.5 %   Platelets 266 150 - 400 K/uL   nRBC 0.0 0.0 - 0.2 %   Neutrophils Relative % 69 %   Neutro Abs 3.1 1.7 - 7.7 K/uL   Lymphocytes Relative 21 %   Lymphs Abs 0.9 0.7 - 4.0 K/uL   Monocytes Relative 9 %   Monocytes Absolute 0.4 0.1 - 1.0 K/uL   Eosinophils Relative 0 %   Eosinophils Absolute 0.0 0.0 - 0.5 K/uL   Basophils Relative 1 %   Basophils Absolute 0.1 0.0 - 0.1 K/uL   Immature Granulocytes 0 %   Abs Immature Granulocytes 0.01 0.00 - 0.07 K/uL    Comment: Performed at [facility], [place] [address] [place], [place] [address]  Comprehensive metabolic panel     Status: Abnormal   Collection Time: 10/08/21  3:58 AM  Result Value Ref Range   Sodium 138 135 - 145 mmol/L   Potassium 4.5 3.5 - 5.1  mmol/L   Chloride 104 98 - 111 mmol/L   CO2 28 22 - 32 mmol/L   Glucose, Bld 69 (L) 70 - 99 mg/dL    Comment: Glucose reference range applies only to samples taken after fasting for at least 8 hours.   BUN 28 (H) 8 - 23 mg/dL   Creatinine, Ser 1.79 (H) 0.61 - 1.24 mg/dL   Calcium 8.9 8.9 - 10.3 mg/dL   Total Protein 5.9 (L) 6.5 - 8.1 g/dL   Albumin 2.3 (L) 3.5 - 5.0 g/dL   AST 180 (H) 15 - 41 U/L   ALT 126 (H) 0 - 44 U/L   Alkaline Phosphatase 140 (H) 38 - 126 U/L   Total Bilirubin 3.2 (H) 0.3 - 1.2 mg/dL   GFR, Estimated 40 (L) >60 mL/min    Comment: (NOTE) Calculated using the CKD-EPI Creatinine Equation (2021)    Anion gap 6 5 - 15    Comment: Performed at [facility], [place] [address] [place], [place] [address]  Magnesium     Status: None   Collection Time: 10/08/21  3:58 AM  Result Value Ref Range   Magnesium 2.0 1.7 - 2.4 mg/dL    Comment: Performed at [facility], [place] [address] [place], [place] [address]  Glucose, capillary     Status: None   Collection Time: 10/08/21  5:10 AM  Result Value Ref Range   Glucose-Capillary 82 70 - 99 mg/dL    Comment: Glucose reference range applies only to samples taken after fasting for at least 8 hours.  Glucose, capillary     Status: Abnormal   Collection Time: 10/08/21  7:25 AM  Result Value Ref Range   Glucose-Capillary 63 (L) 70 - 99 mg/dL    Comment: Glucose reference range applies only to samples taken after fasting for at least 8 hours.  Glucose, capillary     Status: None   Collection Time: 10/08/21 11:18 AM  Result Value Ref Range   Glucose-Capillary 80 70 - 99 mg/dL    Comment: Glucose reference range applies only to samples taken after fasting for at least 8 hours.  Ceruloplasmin     Status: None   Collection Time: 10/08/21 11:46 AM  Result Value Ref Range   Ceruloplasmin 17.9 16.0 - 31.0 mg/dL  Comment: (NOTE) Performed At: [facility] [place], [place]  [id number] Rush Farmer MD [id number]   Alpha-1-antitrypsin     Status: None   Collection Time: 10/08/21 11:46 AM  Result Value Ref Range   A-1 Antitrypsin, Ser 178 101 - 187 mg/dL    Comment: (NOTE) Performed At: [facility] [place], [place] [id number] Rush Farmer MD [id number]   Iron and TIBC     Status: Abnormal   Collection Time: 10/08/21 11:46 AM  Result Value Ref Range   Iron 87 45 - 182 ug/dL   TIBC 99 (L) 250 - 450 ug/dL   Saturation Ratios 88 (H) 17.9 - 39.5 %   UIBC 12 ug/dL    Comment: Performed at [facility], [place] [address] [place], [place] [address]  Ferritin     Status: Abnormal   Collection Time: 10/08/21 11:46 AM  Result Value Ref Range   Ferritin 1,393 (H) 24 - 336 ng/mL    Comment: Performed at [facility], [place] [address], [place], [place] [address]  Glucose, capillary     Status: Abnormal   Collection Time: 10/08/21  4:50 PM  Result Value Ref Range   Glucose-Capillary 46 (L) 70 - 99 mg/dL    Comment: Glucose reference range applies only to samples taken after fasting for at least 8 hours.  Glucose, capillary     Status: Abnormal   Collection Time: 10/08/21  6:11 PM  Result Value Ref Range   Glucose-Capillary 223 (H) 70 - 99 mg/dL    Comment: Glucose reference range applies only to samples taken after fasting for at least 8 hours.  Glucose, capillary     Status: Abnormal   Collection Time: 10/08/21 10:29 PM  Result Value Ref Range   Glucose-Capillary 243 (H) 70 - 99 mg/dL    Comment: Glucose reference range applies only to samples taken after fasting for at least 8 hours.  CBC with Differential/Platelet     Status: Abnormal   Collection Time: 10/09/21  3:56 AM  Result Value Ref Range   WBC 3.8 (L) 4.0 - 10.5 K/uL   RBC 3.62 (L) 4.22 - 5.81 MIL/uL   Hemoglobin 10.5 (L) 13.0 - 17.0 g/dL   HCT 33.8 (L) 39.0 - 52.0 %   MCV 93.4 80.0 - 100.0 fL   MCH 29.0 26.0 - 34.0 pg   MCHC  31.1 30.0 - 36.0 g/dL   RDW 15.9 (H) 11.5 - 15.5 %   Platelets 238 150 - 400 K/uL   nRBC 0.0 0.0 - 0.2 %   Neutrophils Relative % 60 %   Neutro Abs 2.3 1.7 - 7.7 K/uL   Lymphocytes Relative 27 %   Lymphs Abs 1.0 0.7 - 4.0 K/uL   Monocytes Relative 11 %   Monocytes Absolute 0.4 0.1 - 1.0 K/uL   Eosinophils Relative 0 %   Eosinophils Absolute 0.0 0.0 - 0.5 K/uL   Basophils Relative 2 %   Basophils Absolute 0.1 0.0 - 0.1 K/uL   Immature Granulocytes 0 %   Abs Immature Granulocytes 0.01 0.00 - 0.07 K/uL    Comment: Performed at [facility], [place] [address] [place], [place] [address]  Comprehensive metabolic panel     Status: Abnormal   Collection Time: 10/09/21  3:56 AM  Result Value Ref Range   Sodium 139 135 - 145 mmol/L   Potassium 4.3 3.5 - 5.1 mmol/L   Chloride 107 98 - 111  mmol/L   CO2 26 22 - 32 mmol/L   Glucose, Bld 105 (H) 70 - 99 mg/dL    Comment: Glucose reference range applies only to samples taken after fasting for at least 8 hours.   BUN 23 8 - 23 mg/dL   Creatinine, Ser 1.74 (H) 0.61 - 1.24 mg/dL   Calcium 8.5 (L) 8.9 - 10.3 mg/dL   Total Protein 5.9 (L) 6.5 - 8.1 g/dL   Albumin 2.2 (L) 3.5 - 5.0 g/dL   AST 200 (H) 15 - 41 U/L   ALT 135 (H) 0 - 44 U/L   Alkaline Phosphatase 158 (H) 38 - 126 U/L   Total Bilirubin 1.8 (H) 0.3 - 1.2 mg/dL   GFR, Estimated 42 (L) >60 mL/min    Comment: (NOTE) Calculated using the CKD-EPI Creatinine Equation (2021)    Anion gap 6 5 - 15    Comment: Performed at [facility], [place] [address], [place], [place] [address]  Magnesium     Status: None   Collection Time: 10/09/21  3:56 AM  Result Value Ref Range   Magnesium 2.1 1.7 - 2.4 mg/dL    Comment: Performed at [facility], [place] [address] [place], [place] [address]  Protime-INR     Status: None   Collection Time: 10/09/21  3:56 AM  Result Value Ref Range   Prothrombin Time 14.8 11.4 - 15.2 seconds   INR 1.2 0.8 - 1.2     Comment: (NOTE) INR goal varies based on device and disease states. Performed at [facility], [place] [address] [place], [place] [address]   Glucose, capillary     Status: Abnormal   Collection Time: 10/09/21  8:34 AM  Result Value Ref Range   Glucose-Capillary 123 (H) 70 - 99 mg/dL    Comment: Glucose reference range applies only to samples taken after fasting for at least 8 hours.    Assessment Elevated Transaminase compatible with hepatocellular injury, likely drug induced liver injury. 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 If liver functions downtrending at that time can restart medications with close liver function monitoring. Will need H pylori stool antigen test outpatient in 2 months for eradication Can follow up with GI or get with PCP  [facility] will sign off.  Please contact us if we can be of any further assistance during this hospital stay.   Vladimir Crofts PA-C 10/09/2021, 9:44 AM  Contact #  [phone]

## 2021-10-09 NOTE — Evaluation (Signed)
Physical Therapy Evaluation Patient Details Name: Adam Chung MRN: [id number] DOB: 13-Jul-1951 Today's Date: 10/09/2021  History of Present Illness  Pt is a 70 yo male presents with complaints of lower abdominal discomfort and a nondraining Foley catheter. 11/7 urology exchaged catheter and irrigated clots.  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 Pt reports being independent at baseline, was recently hospitalized and d/c to SNF but states decreased functional status from inability to eat and getting dehydrated. Pt currently getting OOB, performing transfers and ambulating with min guard-supv, therapist managing IV and foley catheter lines for safety, no LOB, no SOB and only pain at catheter site is constant and unchanged with mobility. Pt tolerates BLE strengthening exercises without complaints, educated on performing additional sets/reps and ambulating with nursing to restroom or in hallway as able to get steps in and pt verbalizes agreement. Pt reports he has a good friend he can stay with while regaining strength who can assist with household chores and physically if needed; pt A&O x4, agitated initially but improves with mobility due to pt wanting to get stronger and mobilize. Pt currently with functional limitations due to the deficits listed below (see PT Problem List). Pt will benefit from skilled PT to increase their independence and safety with mobility to allow discharge to the venue listed below.          Recommendations for follow up therapy are one component of a multi-disciplinary discharge planning process, led by the attending physician.  Recommendations may be updated based on patient status, additional functional criteria and insurance  authorization.  Follow Up Recommendations Home health PT    Assistance Recommended at Discharge Intermittent Supervision/Assistance  Functional Status Assessment Patient has had a recent decline in their functional status and demonstrates the ability to make significant improvements in function in a reasonable and predictable amount of time.  Equipment Recommendations  Rolling walker (2 wheels)    Recommendations for Other Services       Precautions / Restrictions Precautions Precautions: Fall Restrictions Weight Bearing Restrictions: No      Mobility  Bed Mobility Overal bed mobility: Needs Assistance Bed Mobility: Supine to Sit  Sidelying to sit: Supervision  General bed mobility comments: slightly increased time, therapist managing IV and foley catheter lines    Transfers Overall transfer level: Needs assistance Equipment used: Rolling walker (2 wheels) Transfers: Sit to/from Stand Sit to Stand: Supervision  General transfer comment: pt powers to stand with BUE assisting, good steadiness upon rising    Ambulation/Gait Ambulation/Gait assistance: Min guard;Supervision Gait Distance (Feet): 150 Feet Assistive device: Rolling walker (2 wheels) Gait Pattern/deviations: Step-through pattern;Decreased stride length Gait velocity: decreased  General Gait Details: step through pattern initially requiring min guard progressing to supv, good steadiness, denies SOB or pain other than foley catheter site pain that is constant, HR up to 130 and improves to 106 once back seated in room  Stairs            Wheelchair Mobility    Modified Rankin (Stroke Patients Only)       Balance Overall balance assessment: No apparent balance deficits (not formally assessed)        Pertinent Vitals/Pain Pain Assessment: Faces Faces Pain Scale: Hurts little more Pain Location: foley catheter sight Pain Descriptors / Indicators: Guarding;Discomfort  Pain Intervention(s): Limited  activity within patient's tolerance;Monitored during session;Repositioned    Home Living Family/patient expects to be discharged to:: Private residence Living Arrangements: Other (Comment) (friend) Available Help at Discharge: Friend(s);Available 24 hours/day Type of Home: House Home Access: Stairs to enter Entrance Stairs-Rails: None Entrance Stairs-Number of Steps: 2   Home Layout: One level Home Equipment: None      Prior Function Prior Level of Function : Independent/Modified Independent;Needs assist  Mobility Comments: pt reports ind without AD, currently at SNF for rehab using RW "I walk the halls" ADLs Comments: pt reports ind with ADLs/IADLs, currently at SNF for rehab requiring assist as needed due to "I'm weak because I can't eat and I'm dehydrated"     Hand Dominance        Extremity/Trunk Assessment   Upper Extremity Assessment Upper Extremity Assessment: Overall WFL for tasks assessed    Lower Extremity Assessment Lower Extremity Assessment: Overall WFL for tasks assessed (AROM WNL, strength grossly 4-/5, denies numbness/tingling)    Cervical / Trunk Assessment Cervical / Trunk Assessment: Normal  Communication   Communication: No difficulties  Cognition Arousal/Alertness: Awake/alert Behavior During Therapy: WFL for tasks assessed/performed Overall Cognitive Status: Within Functional Limits for tasks assessed    General Comments: Pt alert and oriented, slightly agitated initially due to being in hospital and wanting to go home        General Comments      Exercises General Exercises - Lower Extremity Ankle Circles/Pumps: Seated;AROM;Both;10 reps Quad Sets: Seated;AROM;Strengthening;Both;10 reps Short Arc Quad: Seated;AROM;Strengthening;Both;10 reps Hip ABduction/ADduction: Seated;AROM;Strengthening;Both;10 reps Straight Leg Raises: Seated;AROM;Strengthening;Both;10 reps   Assessment/Plan    PT Assessment Patient needs continued PT services  PT  Problem List Decreased activity tolerance;Decreased balance;Pain       PT Treatment Interventions DME instruction;Gait training;Stair training;Functional mobility training;Therapeutic activities;Therapeutic exercise;Balance training;Patient/family education    PT Goals (Current goals can be found in the Care Plan section)  Acute Rehab PT Goals Patient Stated Goal: return home or to friend's house PT Goal Formulation: With patient Time For Goal Achievement: 10/23/21 Potential to Achieve Goals: Good    Frequency Min 3X/week   Barriers to discharge        Co-evaluation               AM-PAC PT "6 Clicks" Mobility  Outcome Measure Help needed turning from your back to your side while in a flat bed without using bedrails?: A Little Help needed moving from lying on your back to sitting on the side of a flat bed without using bedrails?: A Little Help needed moving to and from a bed to a chair (including a wheelchair)?: A Little Help needed standing up from a chair using your arms (e.g., wheelchair or bedside chair)?: A Little Help needed to walk in hospital room?: A Little Help needed climbing 3-5 steps with a railing? : A Little 6 Click Score: 18    End of Session   Activity Tolerance: Patient tolerated treatment well Patient left: in chair;with call bell/phone within reach Nurse Communication: Mobility status PT Visit Diagnosis: Other abnormalities of gait and mobility (R26.89)    Time: 7672-0947 PT Time Calculation (min) (ACUTE ONLY): 30 min   Charges:   PT Evaluation $PT Eval Low Complexity: 1 Low PT Treatments $Therapeutic Exercise: 8-22 mins         Tori Rosaisela Jamroz PT, DPT 10/09/21, 11:48 AM

## 2021-10-09 NOTE — Progress Notes (Signed)
Received from previous RN.  Assessment complete.  All needs addressed.  Bed alarm on.

## 2021-10-09 NOTE — TOC Progression Note (Signed)
Transition of Care [facility]) - Progression Note    Patient Details  Name: Adam Chung MRN: [id number] Date of Birth: [birth year]  [facility] CM/SW Contact  Purcell Mouton, RN Phone Number: 10/09/2021, 1:15 PM  Clinical Narrative:    Spoke with pt and his daughter Adam Chung. Pt will go stay with a friend Adam Chung [address] [place] [phone], [phone]. A call to Adam Chung was made, who confirmed that pt could come stay with her.  Explained that pt will need HHRN/PT in the home and [place] agreed. Pt and Adam Chung asked for hospital bed. Will order hospital bed and RW. Pt's daughter Adam Chung is aware.    Expected Discharge Plan: [place] Barriers to Discharge: No Barriers Identified  Expected Discharge Plan and Services Expected Discharge Plan: [place] arrangements for the past 2 months: [place] Expected Discharge Date:  (unknown)                                     Social Determinants of Health (SDOH) Interventions    Readmission Risk Interventions No flowsheet data found.

## 2021-10-09 NOTE — Progress Notes (Signed)
Progress Note    Adam Chung   CBJ:[id number]  DOB: Apr 01, 1951  DOA: 10/05/2021     3 PCP: Rusty Aus, MD  Initial CC: abd pain, decreased foley output   Hospital Course: Mr. Adam Chung is a 70 yo male with PMH DMII, bladder cancer s/p BCG and gemcitabine, recent H. Pylori (s/p completed abx), CKDIV, ACD, urinary retention who presented with lower abdominal pain and decreased urinary output from his Foley. He was evaluated by urology and underwent Foley exchange which was found to have old firm clots and possible underlying necrotic tissue. In association with bladder outlet obstruction, he had acute on chronic worsening of his renal function.  This improved after Foley exchange, bladder drainage, and IV fluids.  He has been followed by ID since late September 2022 due to AFB infection involving the urine associated with recent BCG injections and gemcitabine.  He was started on rifampin, isoniazid, ethambutol, pyridoxine.  He was continued on this treatment for approximately 3 weeks then had stopped for 1 to 2 weeks and was restarted for the last 2 to 3 weeks.  On admission, he was noted to have mildly elevated LFTs.  Right upper quadrant ultrasound showed normal-appearing liver.  Hepatitis panel also negative. MRCP then obtained and also negative for obstruction. His elevation was considered in context of his treatment with rifampin, INH, and ethambutol. ID and GI were consulted during hospitalization as well.   Interval History:  No events overnight.  Catheter continues to drain well.  He seems to be slowly improving in general.  Appetite still poor but he understands the need for working on his nutrition.  Spoke with his daughter Adam Chung on the phone during evaluation today.  Assessment & Plan: * Acute renal failure superimposed on stage 4 chronic kidney disease ([place]) - patient has history of CKD4. Baseline creat ~ 1.8 - 1.9 - patient presents with increase in creat >0.3 mg/dL above  baseline, creat increase >1.5x baseline presumed to have occurred within past 7 days PTA - creat 3.26 on admission; etiology is considered due to bladder outlet obstruction from obstructed Foley, see urinary retention -Continue Foley, underwent exchange with urology on 10/05/2021 - Continue low rate IVF while appetite remains poor - BMP in a.m. -Renal function continues to improve  Drug-induced liver injury - LFTs continue to uptrend. Hx CCY and no significant CBD dilation on RUQ u/s or sludge appreciated  - MRCP also negative for obstruction or gross abnormality (performed 11/9) - suspicion remains DILI from anti-TB meds (rifampin, INH, ethambutol). Have consulted GI and also in agreement meds seem to be culprit - for now meds on hold; next steps are further workup to rule out autoimmune process - plan is holding treatment for 1 week then resuming as long as LFTs have improved or returned near normal. Then needs weekly lab check; as long as LFTs less than 400 after resuming treatment then considered okay to continue per GI  Urinary retention - Patient has known history of urethral stricture and complicated prostatic urethral anatomy per urology - Foley catheter was exchanged on 10/05/2021 from 11 [place] to an 71 [employer] cath and old from clots were irrigated.  Some concern for possible necrotic tissue as well which is not unexpected in setting of his underlying hx bladder cancer -Catheter now draining well, continue Foley - Outpatient follow-up with urology in approximately 2 weeks (Dr. Tresa Moore)  Bladder outlet obstruction - see urinary retention   Acid fast bacillus - hx bladder  cancer with BCG injections and gemcitibine - AFB urine culture on 08/18/21 is positive; suspicion is for M. Bovis given clinical history - seen by ID on 08/27/21 and started on rifampin, isoniazid, ethambutol, pyridoxine -Patient has had a gap in treatment, unknown reason at this time but has been restarted  approximately 2 to 3 weeks ago - will need to re-schedule outpt ID appt prior to discharge  - see elevated LFTs as well   Decubitus ulcer of sacral region, stage 2 ([place]) - Appreciate assistance from [facility] - Foam dressing to sacrum, change Q 3 days or PRN soiling  H. pylori infection - Recent diagnosis of Helicobacter pylori infection based on biopsies obtained during EGD performed 10/27  - course of abx completed prior to admission  Type 2 diabetes mellitus with stage 4 chronic kidney disease, with long-term current use of insulin (HCC) - Last A1c 12.5% on 09/23/2021 - Continuous SSI and CBGs  GERD (gastroesophageal reflux disease) - Continue PPI    Old records reviewed in assessment of this patient  Antimicrobials:  Rifampin, ethambutol, INH (now on hold as of 11/9 which was last dose)  DVT prophylaxis: HSQ  Code Status:   Code Status: Full Code  Disposition Plan: Status is: Inpatient  Remains inpatient appropriate because: Ongoing treatment of above  Risk of unplanned readmission score: Unplanned Admission- [place] do not use: 30.81   Objective: Blood pressure 120/75, pulse 95, temperature 98.3 F (36.8 C), temperature source Oral, resp. rate 18, SpO2 100 %.  Examination: Physical Exam Constitutional:      General: He is not in acute distress.    Appearance: Normal appearance.  HENT:     Head: Normocephalic and atraumatic.     Mouth/Throat:     Mouth: Mucous membranes are moist.  Eyes:     Extraocular Movements: Extraocular movements intact.  Cardiovascular:     Rate and Rhythm: Normal rate and regular rhythm.  Pulmonary:     Effort: Pulmonary effort is normal. No respiratory distress.     Breath sounds: Normal breath sounds.  Abdominal:     General: Bowel sounds are normal. There is no distension.     Palpations: Abdomen is soft.     Tenderness: There is no abdominal tenderness.  Genitourinary:    Comments: Foley catheter in place with cleared  urine Musculoskeletal:        General: Normal range of motion.     Cervical back: Normal range of motion and neck supple.  Skin:    General: Skin is warm and dry.     Comments: Sacral ulcer appreciated  Neurological:     General: No focal deficit present.     Mental Status: He is alert.  Psychiatric:        Mood and Affect: Mood normal.        Behavior: Behavior normal.     Consultants:  Urology ID GI  Procedures:  Foley cath exchange, 11/7  Data Reviewed: I have personally reviewed labs and imaging studies Creatinine 2.39    LOS: 3 days  Time spent: Greater than 50% of the 35 minute visit was spent in counseling/coordination of care for the patient as laid out in the A&P.   Dwyane Dee, MD [facility] 10/09/2021, 4:09 PM

## 2021-10-10 ENCOUNTER — Emergency Department (HOSPITAL_COMMUNITY)
Admission: EM | Admit: 2021-10-10 | Discharge: 2021-10-11 | Disposition: A | Discharge status: Home / Self Care | Payer: Medicare Other | Attending: Emergency Medicine | Admitting: Emergency Medicine

## 2021-10-10 ENCOUNTER — Other Ambulatory Visit: Payer: Self-pay

## 2021-10-10 DIAGNOSIS — I129 Hypertensive chronic kidney disease with stage 1 through stage 4 chronic kidney disease, or unspecified chronic kidney disease: Secondary | ICD-10-CM | POA: Insufficient documentation

## 2021-10-10 DIAGNOSIS — N184 Chronic kidney disease, stage 4 (severe): Secondary | ICD-10-CM | POA: Insufficient documentation

## 2021-10-10 DIAGNOSIS — Z87891 Personal history of nicotine dependence: Secondary | ICD-10-CM | POA: Insufficient documentation

## 2021-10-10 DIAGNOSIS — T83098A Other mechanical complication of other indwelling urethral catheter, initial encounter: Secondary | ICD-10-CM | POA: Insufficient documentation

## 2021-10-10 DIAGNOSIS — E1110 Type 2 diabetes mellitus with ketoacidosis without coma: Secondary | ICD-10-CM | POA: Insufficient documentation

## 2021-10-10 DIAGNOSIS — Z7984 Long term (current) use of oral hypoglycemic drugs: Secondary | ICD-10-CM | POA: Insufficient documentation

## 2021-10-10 DIAGNOSIS — E1165 Type 2 diabetes mellitus with hyperglycemia: Secondary | ICD-10-CM | POA: Insufficient documentation

## 2021-10-10 DIAGNOSIS — Z794 Long term (current) use of insulin: Secondary | ICD-10-CM | POA: Insufficient documentation

## 2021-10-10 DIAGNOSIS — T839XXA Unspecified complication of genitourinary prosthetic device, implant and graft, initial encounter: Secondary | ICD-10-CM

## 2021-10-10 DIAGNOSIS — E1122 Type 2 diabetes mellitus with diabetic chronic kidney disease: Secondary | ICD-10-CM | POA: Insufficient documentation

## 2021-10-10 DIAGNOSIS — Z8551 Personal history of malignant neoplasm of bladder: Secondary | ICD-10-CM | POA: Insufficient documentation

## 2021-10-10 LAB — COMPREHENSIVE METABOLIC PANEL
ALT: 114 U/L — ABNORMAL HIGH (ref 0–44)
AST: 99 U/L — ABNORMAL HIGH (ref 15–41)
Albumin: 2.2 g/dL — ABNORMAL LOW (ref 3.5–5.0)
Alkaline Phosphatase: 155 U/L — ABNORMAL HIGH (ref 38–126)
Anion gap: 5 (ref 5–15)
BUN: 19 mg/dL (ref 8–23)
CO2: 24 mmol/L (ref 22–32)
Calcium: 8.3 mg/dL — ABNORMAL LOW (ref 8.9–10.3)
Chloride: 107 mmol/L (ref 98–111)
Creatinine, Ser: 1.69 mg/dL — ABNORMAL HIGH (ref 0.61–1.24)
GFR, Estimated: 43 mL/min — ABNORMAL LOW (ref >60)
Glucose, Bld: 195 mg/dL — ABNORMAL HIGH (ref 70–99)
Potassium: 4.3 mmol/L (ref 3.5–5.1)
Sodium: 136 mmol/L (ref 135–145)
Total Bilirubin: 1.4 mg/dL — ABNORMAL HIGH (ref 0.3–1.2)
Total Protein: 5.5 g/dL — ABNORMAL LOW (ref 6.5–8.1)

## 2021-10-10 LAB — RESP PANEL BY RT-PCR (FLU A&B, COVID) ARPGX2
Influenza A by PCR: NEGATIVE
Influenza B by PCR: NEGATIVE
SARS Coronavirus 2 by RT PCR: NEGATIVE

## 2021-10-10 LAB — CBC WITH DIFFERENTIAL/PLATELET
Abs Immature Granulocytes: 0.01 10*3/uL (ref 0.00–0.07)
Basophils Absolute: 0 10*3/uL (ref 0.0–0.1)
Basophils Relative: 1 %
Eosinophils Absolute: 0 10*3/uL (ref 0.0–0.5)
Eosinophils Relative: 0 %
HCT: 31.2 % — ABNORMAL LOW (ref 39.0–52.0)
Hemoglobin: 9.8 g/dL — ABNORMAL LOW (ref 13.0–17.0)
Immature Granulocytes: 0 %
Lymphocytes Relative: 30 %
Lymphs Abs: 1 10*3/uL (ref 0.7–4.0)
MCH: 29.1 pg (ref 26.0–34.0)
MCHC: 31.4 g/dL (ref 30.0–36.0)
MCV: 92.6 fL (ref 80.0–100.0)
Monocytes Absolute: 0.4 10*3/uL (ref 0.1–1.0)
Monocytes Relative: 12 %
Neutro Abs: 2 10*3/uL (ref 1.7–7.7)
Neutrophils Relative %: 57 %
Platelets: 229 10*3/uL (ref 150–400)
RBC: 3.37 MIL/uL — ABNORMAL LOW (ref 4.22–5.81)
RDW: 16.2 % — ABNORMAL HIGH (ref 11.5–15.5)
WBC: 3.5 10*3/uL — ABNORMAL LOW (ref 4.0–10.5)
nRBC: 0 % (ref 0.0–0.2)

## 2021-10-10 LAB — GLUCOSE, CAPILLARY: Glucose-Capillary: 186 mg/dL — ABNORMAL HIGH (ref 70–99)

## 2021-10-10 LAB — MAGNESIUM: Magnesium: 1.9 mg/dL (ref 1.7–2.4)

## 2021-10-10 NOTE — Progress Notes (Signed)
AVS given to patient and explained at the bedside. Medications and follow up appointments have been explained with pt verbalizing understanding.  

## 2021-10-10 NOTE — ED Triage Notes (Signed)
Pt states he left the hospital earlier today and he had a urine catheter placed. He was told by his urology to come to the hospital to have his catheter checked.   Pt states it is draining and leaking. If ER unable to fix then urology will come look at it.

## 2021-10-10 NOTE — Progress Notes (Signed)
Physical Therapy Treatment Patient Details Name: Adam Chung MRN: [id number] DOB: Jun 21, 1951 Today's Date: 10/10/2021   History of Present Illness Pt is a 70 yo male presents with complaints of lower abdominal discomfort and a nondraining Foley catheter. 11/7 urology exchaged catheter and irrigated clots.  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  Recently at [employer] for [employer] but this time "I'm going to a friend's house". Assisted OOB to amb in hallway. General bed mobility comments: increased time self able Therapist navigating IV line and Foley bag. General transfer comment: good use of hands to steady self and good safety cognition.  General Gait Details: tolerated amb around full unit with light lean on walker.  Only c/o was "pressure" pain at [employer]. Assisted to bathroom where he wanted to stay an increased time.  Instructed pt to pull call light when done and notified NT.   Recommendations for follow up therapy are one component of a multi-disciplinary discharge planning process, led by the attending physician.  Recommendations may be updated based on patient status, additional functional criteria and insurance authorization.  Follow Up Recommendations  Home health PT     Assistance Recommended at Discharge    Equipment Recommendations  Rolling walker (2 wheels)    Recommendations for Other Services       Precautions / Restrictions Precautions Precaution Comments: Chronic Foley Restrictions Weight Bearing Restrictions: No     Mobility  Bed Mobility Overal bed mobility: Modified Independent             General bed mobility comments: increased time self able Therapist navigating IV line and Foley bag     Transfers Overall transfer level: Modified independent   Transfers: Sit to/from Stand             General transfer comment: good use of hands to steady self and good safety cognition    Ambulation/Gait Ambulation/Gait assistance: Supervision Gait Distance (Feet): 750 Feet Assistive device: Rolling walker (2 wheels)   Gait velocity: decreased     General Gait Details: tolerated amb around full unit with light lean on walker.  Only c/o was "pressure" pain at [employer].   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        Exercises      General Comments        Pertinent Vitals/Pain Pain Assessment: Faces Faces Pain Scale: Hurts little  more Pain Location: foley catheter sight Pain Descriptors / Indicators: Guarding;Discomfort Pain Intervention(s): Monitored during session    Home Living                          Prior Function            PT Goals (current goals can now be found in the care plan section) Progress towards PT goals: Progressing toward goals    Frequency    Min 3X/week      PT Plan Current plan remains appropriate    Co-evaluation              AM-PAC PT "6 Clicks" Mobility   Outcome Measure  Help needed turning from your back to your side while in a flat bed without using bedrails?: None Help needed moving from lying on your back to sitting on the side of a flat bed without using bedrails?: None Help needed moving to and from a  bed to a chair (including a wheelchair)?: None Help needed standing up from a chair using your arms (e.g., wheelchair or bedside chair)?: None Help needed to walk in hospital room?: A Little Help needed climbing 3-5 steps with a railing? : A Little 6 Click Score: 22    End of Session Equipment Utilized During Treatment: Gait belt   Patient left: Other (comment) (in bathroom, notified NT)   PT Visit Diagnosis: Other abnormalities of gait and mobility (R26.89)     Time: 8546-2703 PT Time Calculation (min) (ACUTE ONLY): 23 min  Charges:  $Gait Training: 8-22 mins $Therapeutic Activity: 8-22 mins                     Rica Koyanagi  PTA Acute  Rehabilitation Services Pager      [phone] Office      [phone]

## 2021-10-10 NOTE — ED Provider Notes (Signed)
[facility] DEPT Provider Note   CSN: [id number] Arrival date & time: 10/10/21  0109     History Chief Complaint  Patient presents with   Urinary Retention     SIRE POET is a 70 y.o. male.  The history is provided by the patient and medical records.   70 y.o. 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  States he left the hospital this AM after admission for same.  Foley was draining when he left but since lunch time he has not had any output at all in the collection bag but urine has been draining around the foley tubing.  He reports steadily increasing pain this evening.  States urology always replaces his foley catheter and does not want ED staff to attempt.  Son reports many difficulties recently, this is the 7th time he has had catheter related issues in the past month.  Past Medical History:  Diagnosis Date   Cancer [facility])    bladder   Diabetes mellitus without complication [facility])     Patient Active Problem List   Diagnosis Date Noted   Drug-induced liver injury 10/07/2021   GERD (gastroesophageal reflux disease) 10/06/2021   H. 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   Abnormal CT scan, stomach 09/24/2021   Malnutrition of moderate degree 09/24/2021   DKA (diabetic ketoacidosis) ([place]) 09/20/2021   Uncontrolled type 2 diabetes mellitus with hyperglycemia ([place]) 09/20/2021   Normocytic anemia 09/20/2021   Hyperkalemia 09/20/2021   Thrombocytosis 09/20/2021   Trigeminy 09/20/2021   Hyperphosphatemia 09/20/2021   Hypermagnesemia 09/20/2021   Pressure injury of skin 09/20/2021   Hypertension 08/07/2021   Type 2  diabetes mellitus with stage 4 chronic kidney disease, with long-term current use of insulin ([place]) 08/07/2021   AKI (acute kidney injury) ([place]) 08/07/2021   Urinary retention 08/07/2021    Past Surgical History:  Procedure Laterality Date   BIOPSY  09/24/2021   Procedure: BIOPSY;  Surgeon: Wilford Corner, MD;  Location: [facility];  Service: Endoscopy;;   BLADDER SURGERY     CYSTOSCOPY N/A 08/09/2021   Procedure: CYSTOSCOPY WITH INSERTION OF FOLEY CATHETER;  Surgeon: Janith Lima, MD;  Location: [facility] ORS;  Service: Urology;  Laterality: N/A;   ESOPHAGOGASTRODUODENOSCOPY (EGD) WITH PROPOFOL N/A 09/24/2021   Procedure: ESOPHAGOGASTRODUODENOSCOPY (EGD) WITH PROPOFOL;  Surgeon: Wilford Corner, MD;  Location: [facility];  Service: Endoscopy;  Laterality: N/A;   fatty tumor excision     TRANSURETHRAL RESECTION OF PROSTATE N/A 08/09/2021   Procedure: TRANSURETHRAL RESECTION OF THE PROSTATE (TURP);  Surgeon: Janith Lima, MD;  Location: [facility] ORS;  Service: Urology;  Laterality: N/A;       Family History  Problem Relation Age of Onset   Heart disease Neg Hx     Social History   Tobacco Use   Smoking status: Former   Smokeless tobacco: Never  Substance Use Topics   Alcohol use: Not Currently    Home Medications Prior to Admission medications   Medication Sig Start Date End Date Taking? Authorizing Provider  acetaminophen (TYLENOL) 325 MG tablet Take 2 tablets (650 mg total) by mouth every 6 (six) hours as needed for mild pain (or  Fever >/= 101). 09/29/21  Yes Eugenie Filler, MD  Cholecalciferol 250 MCG (10000 UT) TABS Take 3,000 Units by mouth daily.   Yes [provider]  Ensure Max Protein (ENSURE MAX PROTEIN) LIQD Take 330 mLs (11 oz total) by mouth 2 (two) times daily. Patient taking differently: Take 11 oz by mouth daily. 09/29/21  Yes Eugenie Filler, MD  glimepiride (AMARYL) 4 MG tablet Take 4 mg by mouth daily with breakfast.    [provider]  insulin aspart (NOVOLOG) 100 UNIT/ML FlexPen Inject 4 Units into the skin 3 (three) times daily with meals. Give only if patient consumes > 50% of meals. 09/29/21   Eugenie Filler, MD  insulin glargine-yfgn (SEMGLEE) 100 UNIT/ML injection Inject 0.1 mLs (10 Units total) into the skin daily. 09/30/21   Eugenie Filler, MD  Multiple Vitamin (MULTIVITAMIN WITH MINERALS) TABS tablet Take 1 tablet by mouth daily. 09/30/21   Eugenie Filler, MD  Nutritional Supplements (,FEEDING SUPPLEMENT, PROSOURCE PLUS) liquid Take 30 mLs by mouth 2 (two) times daily between meals. Patient not taking: Reported on 10/11/2021 09/30/21   Eugenie Filler, MD  ondansetron (ZOFRAN ODT) 8 MG disintegrating tablet Take 1 tablet (8 mg total) by mouth every 8 (eight) hours as needed for nausea or vomiting. Patient not taking: Reported on 10/11/2021 09/10/21   Kuppelweiser, Cassie L, RPH-CPP  senna-docusate (SENOKOT-S) 8.6-50 MG tablet Take 1 tablet by mouth 2 (two) times daily. Patient not taking: Reported on 10/11/2021 09/29/21   Eugenie Filler, MD  tamsulosin (FLOMAX) 0.4 MG CAPS capsule Take 0.4 mg by mouth daily. Patient not taking: Reported on 10/11/2021    [provider]    Allergies    Apple and Penicillins  Review of Systems   Review of Systems  Genitourinary:        Foley issues  All other systems reviewed and are negative.  Physical Exam Updated Vital Signs BP (!) 107/55   Pulse 89   Temp 98.2 F (36.8 C) (Oral)   Resp 18   SpO2 (!) 89%   Physical Exam Vitals and nursing note reviewed.  Constitutional:      Appearance: He is well-developed.  HENT:     Head: Normocephalic and atraumatic.  Eyes:     Conjunctiva/sclera: Conjunctivae normal.     Pupils: Pupils are equal, round, and reactive to light.  Cardiovascular:     Rate and Rhythm: Normal rate and regular rhythm.     Heart sounds: Normal heart sounds.  Pulmonary:     Effort: Pulmonary effort is normal.     Breath  sounds: Normal breath sounds.  Abdominal:     General: Bowel sounds are normal.     Palpations: Abdomen is soft.  Genitourinary:    Comments: 37F foley catheter in place, small amount of urine leaking around tubing, no urine in collection bag Musculoskeletal:        General: Normal range of motion.     Cervical back: Normal range of motion.  Skin:    General: Skin is warm and dry.  Neurological:     Mental Status: He is alert and oriented to person, place, and time.    ED Results / Procedures / Treatments   Labs (all labs ordered are listed, but only abnormal results are displayed) Labs Reviewed - No data to display  EKG None  Radiology No results found.  Procedures Procedures   Medications Ordered in ED Medications  oxyCODONE-acetaminophen (PERCOCET/ROXICET) 5-325  MG per tablet 2 tablet (2 tablets Oral Given 10/11/21 0217)  HYDROmorphone (DILAUDID) injection 1 mg (1 mg Intravenous Given 10/11/21 0333)  gentamicin (GARAMYCIN) injection 160 mg (160 mg Intramuscular Given 10/11/21 0336)    ED Course  I have reviewed the triage vital signs and the nursing notes.  Pertinent labs & imaging results that were available during my care of the patient were reviewed by me and considered in my medical decision making (see chart for details).    MDM Rules/Calculators/A&P                           70 year old male presenting to the ED with Foley catheter problem.  He was just released from the hospital this morning and Foley was draining fine, stopped around midday but has had some leakage around the tubing from the meatus.  He does have complex urologic history and anatomy secondary to prior prostate surgery.  Attempted to irrigate catheter here without success.  Urology has exchange catheter using video cystoscopy.  Catheter currently draining without issue.  He was given garamycin per urology recommendation for infection prophylaxis.  He was discharged home with irrigation  supplies and instructed to do this at least twice a day.  He has scheduled urology follow-up.  Can return here for new concerns.  Final Clinical Impression(s) / ED Diagnoses Final diagnoses:  Problem with Foley catheter, initial encounter [facility])    Rx / DC Orders ED Discharge Orders          Ordered    oxybutynin (DITROPAN) 5 MG tablet  3 times daily        Pending             Larene Pickett, PA-C 10/11/21 [place], April, MD 10/11/21 [phone]

## 2021-10-10 NOTE — Discharge Summary (Signed)
Physician Discharge Summary   Patient name: Adam Chung  Admit date:     10/05/2021  Discharge date: 10/10/2021  Discharge Physician: Dwyane Dee   PCP: Rusty Aus, MD   Recommendations at discharge:  Follow up with ID; continue weekly LFT check as per plan below  Discharge Diagnoses Principal Problem:   Acute renal failure superimposed on stage 4 chronic kidney disease ([place]) Active Problems:   Urinary retention   Drug-induced liver injury   Acid fast bacillus   Bladder outlet obstruction   Type 2 diabetes mellitus with stage 4 chronic kidney disease, with long-term current use of insulin (HCC)   H. pylori infection   Decubitus ulcer of sacral region, stage 2 (HCC)   GERD (gastroesophageal reflux disease)   Resolved Diagnoses Resolved Problems:   * No resolved hospital problems. [name] Course   Mr. Adam Chung is a 70 yo male with PMH DMII, bladder cancer s/p BCG and gemcitabine, recent H. Pylori (s/p completed abx), CKDIV, ACD, urinary retention who presented with lower abdominal pain and decreased urinary output from his Foley. He was evaluated by urology and underwent Foley exchange which was found to have old firm clots and possible underlying necrotic tissue. In association with bladder outlet obstruction, he had acute on chronic worsening of his renal function.  This improved after Foley exchange, bladder drainage, and IV fluids.  He has been followed by ID since late September 2022 due to AFB infection involving the urine associated with recent BCG injections and gemcitabine.  He was started on rifampin, isoniazid, ethambutol, pyridoxine.  He was continued on this treatment for approximately 3 weeks then had stopped for 1 to 2 weeks and was restarted for the last 2 to 3 weeks.  On admission, he was noted to have mildly elevated LFTs.  Right upper quadrant ultrasound showed normal-appearing liver.  Hepatitis panel also negative. MRCP then obtained and also  negative for obstruction. His elevation was considered in context of his treatment with rifampin, INH, and ethambutol. ID and GI were consulted during hospitalization as well.    * Acute renal failure superimposed on stage 4 chronic kidney disease ([name]) - patient has history of CKD4. Baseline creat ~ 1.8 - 1.9 - patient presents with increase in creat >0.3 mg/dL above baseline, creat increase >1.5x baseline presumed to have occurred within past 7 days PTA - creat 3.26 on admission; etiology is considered due to bladder outlet obstruction from obstructed Foley, see urinary retention -Continue Foley, underwent exchange with urology on 10/05/2021 - renal function baseline at discharge  Drug-induced liver injury - LFTs continue to uptrend. Hx CCY and no significant CBD dilation on RUQ u/s or sludge appreciated  - MRCP also negative for obstruction or gross abnormality (performed 11/9) - suspicion remains DILI from anti-TB meds (rifampin, INH, ethambutol). Have consulted GI and also in agreement meds seem to be culprit - for now meds on hold; next steps are further workup to rule out autoimmune process - plan is holding treatment for 1 week then resuming as long as LFTs have improved or returned near normal. Then needs weekly lab check; as long as LFTs less than 400 after resuming treatment then considered okay to continue per GI  Urinary retention - Patient has known history of urethral stricture and complicated prostatic urethral anatomy per urology - Foley catheter was exchanged on 10/05/2021 from 85 [place] to an 85 [employer] cath and old from clots were irrigated.  Some concern for  possible necrotic tissue as well which is not unexpected in setting of his underlying hx bladder cancer -Catheter now draining well, continue Foley - Outpatient follow-up with urology in approximately 2 weeks (Dr. Tresa Moore)  Bladder outlet obstruction - see urinary retention   Acid fast bacillus - hx bladder  cancer with BCG injections and gemcitibine - AFB urine culture on 08/18/21 is positive; suspicion is for M. Bovis given clinical history - seen by ID on 08/27/21 and started on rifampin, isoniazid, ethambutol, pyridoxine -Patient has had a gap in treatment, unknown reason at this time but has been restarted approximately 2 to 3 weeks ago - will need to re-schedule outpt ID appt prior to discharge  - see elevated LFTs as well   Decubitus ulcer of sacral region, stage 2 ([place]) - Appreciate assistance from [place] - Foam dressing to sacrum, change Q 3 days or PRN soiling  H. pylori infection - Recent diagnosis of Helicobacter pylori infection based on biopsies obtained during EGD performed 10/27  - course of abx completed prior to admission  Type 2 diabetes mellitus with stage 4 chronic kidney disease, with long-term current use of insulin (HCC) - Last A1c 12.5% on 09/23/2021 - Continuous SSI and CBGs  GERD (gastroesophageal reflux disease) - Continue PPI     Condition at discharge: stable  Exam Physical Exam Constitutional:      General: He is not in acute distress.    Appearance: Normal appearance.  HENT:     Head: Normocephalic and atraumatic.     Mouth/Throat:     Mouth: Mucous membranes are moist.  Eyes:     Extraocular Movements: Extraocular movements intact.  Cardiovascular:     Rate and Rhythm: Normal rate and regular rhythm.  Pulmonary:     Effort: Pulmonary effort is normal. No respiratory distress.     Breath sounds: Normal breath sounds.  Abdominal:     General: Bowel sounds are normal. There is no distension.     Palpations: Abdomen is soft.     Tenderness: There is no abdominal tenderness.  Genitourinary:    Comments: Foley catheter in place with cleared urine Musculoskeletal:        General: Normal range of motion.     Cervical back: Normal range of motion and neck supple.  Skin:    General: Skin is warm and dry.     Comments: Sacral ulcer appreciated   Neurological:     General: No focal deficit present.     Mental Status: He is alert.  Psychiatric:        Mood and Affect: Mood normal.        Behavior: Behavior normal.     Disposition: Home  Discharge time: greater than 30 minutes.  Follow-up Information     [facility] Follow up.   Specialty: [place] Why: [place] for [facility], Number [phone].        [facility], [facility] Follow up.   Why: your Hospital bed and walker is from [employer] this [employer]. Please call [employer] if you have any questions or concerns [phone] Contact information: [place] [address] [phone]         Tsosie Billing, MD. Schedule an appointment as soon as possible for a visit in 2 day(s).   Specialty: Infectious Diseases Why: Last dose of meds 10/07/21 Contact information: [place] [address] [phone]  Allergies as of 10/10/2021       Reactions   Apple Anaphylaxis   Penicillins Swelling   Swelling of lip per patient        Medication List     STOP taking these medications    bismuth subsalicylate 185 MG chewable tablet Commonly known as: PEPTO BISMOL   ethambutol 400 MG tablet Commonly known as: MYAMBUTOL   isoniazid 300 MG tablet Commonly known as: NYDRAZID   metroNIDAZOLE 500 MG tablet Commonly known as: FLAGYL   pantoprazole 40 MG tablet Commonly known as: PROTONIX   pyridOXINE 50 MG tablet Commonly known as: B-6   rifampin 300 MG capsule Commonly known as: RIFADIN   tetracycline 500 MG capsule Commonly known as: SUMYCIN       TAKE these medications    Ensure Max Protein Liqd Take 330 mLs (11 oz total) by mouth 2 (two) times daily.   (feeding supplement) PROSource Plus liquid Take 30 mLs by mouth 2 (two) times daily between meals.   acetaminophen 325 MG tablet Commonly known as: TYLENOL Take 2 tablets (650 mg total) by  mouth every 6 (six) hours as needed for mild pain (or Fever >/= 101).   Cholecalciferol 250 MCG (10000 UT) Tabs Take 3,000 Units by mouth daily.   glimepiride 4 MG tablet Commonly known as: AMARYL Take 4 mg by mouth daily with breakfast.   insulin aspart 100 UNIT/ML FlexPen Commonly known as: NOVOLOG Inject 4 Units into the skin 3 (three) times daily with meals. Give only if patient consumes > 50% of meals.   insulin glargine-yfgn 100 UNIT/ML injection Commonly known as: SEMGLEE Inject 0.1 mLs (10 Units total) into the skin daily.   multivitamin with minerals Tabs tablet Take 1 tablet by mouth daily.   ondansetron 8 MG disintegrating tablet Commonly known as: Zofran ODT Take 1 tablet (8 mg total) by mouth every 8 (eight) hours as needed for nausea or vomiting.   senna-docusate 8.6-50 MG tablet Commonly known as: Senokot-S Take 1 tablet by mouth 2 (two) times daily.   tamsulosin 0.4 MG Caps capsule Commonly known as: FLOMAX Take 0.4 mg by mouth daily.               Durable Medical Equipment  (From admission, onward)           Start     Ordered   10/09/21 1321  For home use only DME Walker rolling  Once       Question Answer Comment  [place]: With [place]   Patient needs a walker to treat with the following condition Fear for personal safety      10/09/21 1321   10/09/21 1259  For home use only DME Hospital bed  Once       Question Answer Comment  Length of Need 12 Months   Patient has (list medical condition): H.Pylori, Bladder Cancer, GERD, Decubitus ulcer   The above medical condition requires: Patient requires the ability to reposition frequently   Head must be elevated greater than: 30 degrees   Bed type Semi-electric   Support Surface: Gel Overlay      10/09/21 1308              Discharge Care Instructions  (From admission, onward)           Start     Ordered   10/10/21 0000  Discharge wound care:       Comments: Continue foam  dressing to sacrum, change  every 3 days or if soiled   10/10/21 1033            CT ABDOMEN PELVIS WO CONTRAST  Result Date: 09/20/2021 CLINICAL DATA:  Weakness and lethargy. Not eating or drinking x2 days. History low-grade bladder cancer treated with TURBT and BCG. 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 EXAM: CT ABDOMEN AND PELVIS WITHOUT CONTRAST TECHNIQUE: Multidetector CT imaging of the abdomen and pelvis was performed following the standard protocol without IV contrast. COMPARISON:  CT abdomen pelvis 08/09/2021 FINDINGS: Lower chest: No acute abnormality. Hepatobiliary: A 0.6 cm oval circumscribed hypodensity at the dome of the liver is stable dating back to 07/28/2018, likely benign hepatic cyst (series 2, image 11). Status post cholecystectomy. No biliary dilatation. Pancreas: Unremarkable. No pancreatic ductal dilatation or surrounding inflammatory changes. Spleen: Normal in size without focal abnormality. Adrenals/Urinary Tract: Adrenal glands are unremarkable. Redemonstration of severe bilateral hydroureteronephrosis. No nephrolithiasis or perinephric fat stranding. Previously noted tiny left renal cortical cyst is not appreciated. No new renal mass. Urinary bladder is mildly distended with mild diffuse wall thickening measuring 0.4 cm and mild adjacent fat stranding (series 2, image 70. Stomach/Bowel: Diffuse thickening of the distal esophagus, which can be seen in the setting of reflux. There is a 3 cm area of thickening versus mass in the gastric fundus (series 2, image 14), new compared to CT 07/28/2018. Small bowel feces sign is seen within the distal ileum, consistent with slow transit. No dilatation of the small bowel. Stool is seen within the ascending colon with hard formed stools noted in transverse colon and scattered throughout the descending colon. Scattered  diverticula in the sigmoid colon. No inflammatory changes of the bowel. Vascular/Lymphatic: Minimal scattered aortoiliac calcific atherosclerosis. No abdominal aortic aneurysm. No enlarged abdominal or pelvic lymph nodes. The previously noted small perirectal lymph node seen on 08/06/2021 are not appreciated on today's exam. Reproductive: Postoperative changes of resection. Other: Small bilateral fat containing inguinal hernias. Musculoskeletal: Stable sclerotic lesions in the right iliac bone and left acetabulum, unchanged compared to 07/28/2018. multilevel degenerative disc disease in the lumbar spine, most severe at L1-L2 with vacuum disc phenomenon. No suspicious osseous lesion. IMPRESSION: 1. Diffuse mild wall thickening of the urinary bladder with mild adjacent fat stranding, suggestive of recurrent cystitis. 2. Unchanged bilateral severe hydroureteronephrosis. No nephrolithiasis or perinephric fat stranding. 3. In the gastric fundus, there is a 3 cm mass versus area of focal thickening. Recommend further evaluation with EGD for direct visualization. 4. Hard formed stools in the transverse and descending colon with small bowel feces sign in the distal ileum, indicative of slow transit. No bowel obstruction. Electronically Signed   By: Ileana Roup M.D.   On: 09/20/2021 12:44   MR 3D Recon At Scanner  Result Date: 10/07/2021 CLINICAL DATA:  Right upper quadrant abdominal pain, jaundice, elevated LFTs EXAM: MRI ABDOMEN WITHOUT AND WITH CONTRAST (INCLUDING MRCP) TECHNIQUE: Multiplanar multisequence MR imaging of the abdomen was performed both before and after the administration of intravenous contrast. Heavily T2-weighted images of the biliary and pancreatic ducts were obtained, and three-dimensional MRCP images were rendered by post processing. CONTRAST:  91mL GADAVIST GADOBUTROL 1 MMOL/ML IV SOLN COMPARISON:  CT abdomen/pelvis dated 09/20/2021. Right upper quadrant ultrasound dated 10/06/2021. FINDINGS:  Lower chest: Lung bases are clear. Hepatobiliary: Subcentimeter cyst in the anterior right hepatic dome. No suspicious/enhancing hepatic lesions. No hepatic steatosis. Status post cholecystectomy. No hepatic or  extrahepatic ductal dilatation. Common duct measures 6 mm. No choledocholithiasis is seen. Pancreas:  Within normal limits. Spleen:  Within normal limits. Adrenals/Urinary Tract:  Adrenal glands are within normal limits. Moderate right and mild left hydronephrosis, chronic. Kidneys otherwise within normal limits. Stomach/Bowel: Prominent gastric folds along the posterior gastric cardia (series 22/image 23), without convincing mass. Visualized bowel is grossly unremarkable. Vascular/Lymphatic:  No evidence of abdominal aortic aneurysm. No suspicious abdominal lymphadenopathy. Other:  No abdominal ascites. Musculoskeletal: No focal osseous lesions. IMPRESSION: Status post cholecystectomy. No intrahepatic or extrahepatic ductal dilatation. No choledocholithiasis is seen. No suspicious/enhancing hepatic lesions. Moderate right and mild left hydronephrosis, chronic. Electronically Signed   By: Julian Hy M.D.   On: 10/07/2021 23:59   MR ABDOMEN MRCP W WO CONTAST  Result Date: 10/07/2021 CLINICAL DATA:  Right upper quadrant abdominal pain, jaundice, elevated LFTs EXAM: MRI ABDOMEN WITHOUT AND WITH CONTRAST (INCLUDING MRCP) TECHNIQUE: Multiplanar multisequence MR imaging of the abdomen was performed both before and after the administration of intravenous contrast. Heavily T2-weighted images of the biliary and pancreatic ducts were obtained, and three-dimensional MRCP images were rendered by post processing. CONTRAST:  58mL GADAVIST GADOBUTROL 1 MMOL/ML IV SOLN COMPARISON:  CT abdomen/pelvis dated 09/20/2021. Right upper quadrant ultrasound dated 10/06/2021. FINDINGS: Lower chest: Lung bases are clear. Hepatobiliary: Subcentimeter cyst in the anterior right hepatic dome. No suspicious/enhancing hepatic  lesions. No hepatic steatosis. Status post cholecystectomy. No hepatic or extrahepatic ductal dilatation. Common duct measures 6 mm. No choledocholithiasis is seen. Pancreas:  Within normal limits. Spleen:  Within normal limits. Adrenals/Urinary Tract:  Adrenal glands are within normal limits. Moderate right and mild left hydronephrosis, chronic. Kidneys otherwise within normal limits. Stomach/Bowel: Prominent gastric folds along the posterior gastric cardia (series 22/image 23), without convincing mass. Visualized bowel is grossly unremarkable. Vascular/Lymphatic:  No evidence of abdominal aortic aneurysm. No suspicious abdominal lymphadenopathy. Other:  No abdominal ascites. Musculoskeletal: No focal osseous lesions. IMPRESSION: Status post cholecystectomy. No intrahepatic or extrahepatic ductal dilatation. No choledocholithiasis is seen. No suspicious/enhancing hepatic lesions. Moderate right and mild left hydronephrosis, chronic. Electronically Signed   By: Julian Hy M.D.   On: 10/07/2021 23:59   ECHOCARDIOGRAM COMPLETE  Result Date: 09/21/2021    ECHOCARDIOGRAM REPORT   Patient Name:   KARTEL WOLBERT Date of Exam: 09/21/2021 Medical Rec #:  [id number]         Height:       71.0 in Accession #:    [id number]        Weight:       157.4 lb Date of Birth:  [birth year]         BSA:          1.905 m Patient Age:    62 years          BP:           119/67 mmHg Patient Gender: M                 HR:           96 bpm. Exam Location:  Inpatient Procedure: 2D Echo, Cardiac Doppler, Color Doppler and Intracardiac            Opacification Agent Indications:    Trigeminy [[id number]]  History:        Patient has no prior history of Echocardiogram examinations.                 Risk Factors:Diabetes. Cancer.  Sonographer:    Jonelle Sidle  Burt Knack RDCS Referring Phys: [id number] Clinton Wahlberg MANUEL ORTIZ  Sonographer Comments: Suboptimal parasternal window and suboptimal apical window. IMPRESSIONS  1. Technically difficult echo with  poor image quality.  2. Left ventricular ejection fraction, by estimation, is 60 to 65%. The left ventricle has normal function. The left ventricle has no regional wall motion abnormalities. Left ventricular diastolic parameters were normal.  3. Right ventricular systolic function is mildly reduced. The right ventricular size is normal.  4. The mitral valve is grossly normal. No evidence of mitral valve regurgitation.  5. The aortic valve is normal in structure. Aortic valve regurgitation is not visualized. No aortic stenosis is present. FINDINGS  Left Ventricle: Left ventricular ejection fraction, by estimation, is 60 to 65%. The left ventricle has normal function. The left ventricle has no regional wall motion abnormalities. Definity contrast agent was given IV to delineate the left ventricular  endocardial borders. The left ventricular internal cavity size was normal in size. There is no left ventricular hypertrophy. Left ventricular diastolic parameters were normal. Right Ventricle: The right ventricular size is normal. Right vetricular wall thickness was not well visualized. Right ventricular systolic function is mildly reduced. Left Atrium: Left atrial size was normal in size. Right Atrium: Right atrial size was normal in size. Pericardium: There is no evidence of pericardial effusion. Mitral Valve: The mitral valve is grossly normal. No evidence of mitral valve regurgitation. Tricuspid Valve: The tricuspid valve is not well visualized. Tricuspid valve regurgitation is trivial. Aortic Valve: The aortic valve is normal in structure. Aortic valve regurgitation is not visualized. No aortic stenosis is present. Pulmonic Valve: The pulmonic valve was not well visualized. Pulmonic valve regurgitation is not visualized. Aorta: The aortic root and ascending aorta are structurally normal, with no evidence of dilitation. IAS/Shunts: The atrial septum is grossly normal. Additional Comments: Technically difficult echo  with poor image quality.  LEFT VENTRICLE PLAX 2D LVOT diam:     2.10 cm   Diastology LV SV:         59        LV e' medial:    5.19 cm/s LV SV Index:   31        LV E/e' medial:  12.8 LVOT Area:     3.46 cm  LV e' lateral:   7.51 cm/s                          LV E/e' lateral: 8.8  RIGHT VENTRICLE RV S prime:     11.40 cm/s TAPSE (M-mode): 1.6 cm LEFT ATRIUM             Index LA Vol (A2C):   19.9 ml 10.45 ml/m LA Vol (A4C):   19.6 ml 10.29 ml/m LA Biplane Vol: 20.4 ml 10.71 ml/m  AORTIC VALVE LVOT Vmax:   80.80 cm/s LVOT Vmean:  57.000 cm/s LVOT VTI:    0.170 m  AORTA Ao Root diam: 3.50 cm MITRAL VALVE MV Area (PHT): 3.63 cm    SHUNTS MV Decel Time: 209 msec    Systemic VTI:  0.17 m MV E velocity: 66.40 cm/s  Systemic Diam: 2.10 cm MV A velocity: 93.00 cm/s MV E/A ratio:  0.71 Mertie Moores MD Electronically signed by Mertie Moores MD Signature Date/Time: 09/21/2021/2:08:38 PM    Final    US Abdomen Limited RUQ (LIVER/GB)  Result Date: 10/06/2021 CLINICAL DATA:  Elevated LFTs EXAM: ULTRASOUND ABDOMEN LIMITED RIGHT UPPER QUADRANT COMPARISON:  CT abdomen pelvis 09/20/2021  FINDINGS: Gallbladder: Prior cholecystectomy. Common bile duct: Diameter: 6.0 mm Liver: No focal lesion identified. Within normal limits in parenchymal echogenicity. Portal vein is patent on color Doppler imaging with normal direction of blood flow towards the liver. Other: Partially visualized moderate to severe hydronephrosis of the right kidney, as seen on multiple prior recent CTs. IMPRESSION: Normal sonographic appearance of the liver. Prior cholecystectomy.  No evidence of biliary obstruction. Partially visualized moderate to severe hydronephrosis of the right kidney, as seen on multiple prior CTs. Electronically Signed   By: Maurine Simmering M.D.   On: 10/06/2021 08:53   Results for orders placed or performed during the hospital encounter of 10/05/21  Urine Culture     Status: None   Collection Time: 10/05/21  8:56 PM   Specimen: Urine,  Catheterized  Result Value Ref Range Status   Specimen Description   Final    URINE, CATHETERIZED Performed at [facility] [address], [place], [place] [address]    Special Requests   Final    NONE Performed at [facility], [place] [address], [place], [place] [address]    Culture   Final    NO GROWTH Performed at [facility], [place] [address] [place], [place] [address]    Report Status 10/06/2021 FINAL  Final  Resp Panel by RT-PCR (Flu A&B, Covid) Nasopharyngeal Swab     Status: None   Collection Time: 10/05/21  9:53 PM   Specimen: Nasopharyngeal Swab; Nasopharyngeal(NP) swabs in vial transport medium  Result Value Ref Range Status   SARS Coronavirus 2 by RT PCR NEGATIVE NEGATIVE Final    Comment: (NOTE) SARS-CoV-2 target nucleic acids are NOT DETECTED.  The SARS-CoV-2 RNA is generally detectable in upper respiratory specimens during the acute phase of infection. The lowest concentration of SARS-CoV-2 viral copies this assay can detect is 138 copies/mL. A negative result does not preclude SARS-Cov-2 infection and should not be used as the sole basis for treatment or other patient management decisions. A negative result may occur with  improper specimen collection/handling, submission of specimen other than nasopharyngeal swab, presence of viral mutation(s) within the areas targeted by this assay, and inadequate number of viral copies(<138 copies/mL). A negative result must be combined with clinical observations, patient history, and epidemiological information. The expected result is Negative.  Fact Sheet for Patients:  EntrepreneurPulse.com.au  Fact Sheet for Healthcare Providers:  IncredibleEmployment.be  This test is no t yet approved or cleared by the [place] FDA and  has been authorized for detection and/or diagnosis of SARS-CoV-2 by FDA under an Emergency Use Authorization (EUA).  This EUA will remain  in effect (meaning this test can be used) for the duration of the COVID-19 declaration under Section 564(b)(1) of the Act, 21 U.S.C.section 360bbb-3(b)(1), unless the authorization is terminated  or revoked sooner.       Influenza A by PCR NEGATIVE NEGATIVE Final   Influenza B by PCR NEGATIVE NEGATIVE Final    Comment: (NOTE) The Xpert Xpress SARS-CoV-2/FLU/RSV plus assay is intended as an aid in the diagnosis of influenza from Nasopharyngeal swab specimens and should not be used as a sole basis for treatment. Nasal washings and aspirates are unacceptable for Xpert Xpress SARS-CoV-2/FLU/RSV testing.  Fact Sheet for Patients: EntrepreneurPulse.com.au  Fact Sheet for Healthcare Providers: IncredibleEmployment.be  This test is not yet approved or cleared by the [place] FDA and has been authorized for detection and/or diagnosis of SARS-CoV-2 by FDA under an Emergency  Use Authorization (EUA). This EUA will remain in effect (meaning this test can be used) for the duration of the COVID-19 declaration under Section 564(b)(1) of the Act, 21 U.S.C. section 360bbb-3(b)(1), unless the authorization is terminated or revoked.  Performed at [facility], [place] [address] [place], [place] [address]   Resp Panel by RT-PCR (Flu A&B, Covid) Nasopharyngeal Swab     Status: None   Collection Time: 10/10/21  9:37 AM   Specimen: Nasopharyngeal Swab; Nasopharyngeal(NP) swabs in vial transport medium  Result Value Ref Range Status   SARS Coronavirus 2 by RT PCR NEGATIVE NEGATIVE Final    Comment: (NOTE) SARS-CoV-2 target nucleic acids are NOT DETECTED.  The SARS-CoV-2 RNA is generally detectable in upper respiratory specimens during the acute phase of infection. The lowest concentration of SARS-CoV-2 viral copies this assay can detect is 138 copies/mL. A negative result does not preclude SARS-Cov-2 infection and  should not be used as the sole basis for treatment or other patient management decisions. A negative result may occur with  improper specimen collection/handling, submission of specimen other than nasopharyngeal swab, presence of viral mutation(s) within the areas targeted by this assay, and inadequate number of viral copies(<138 copies/mL). A negative result must be combined with clinical observations, patient history, and epidemiological information. The expected result is Negative.  Fact Sheet for Patients:  EntrepreneurPulse.com.au  Fact Sheet for Healthcare Providers:  IncredibleEmployment.be  This test is no t yet approved or cleared by the [place] FDA and  has been authorized for detection and/or diagnosis of SARS-CoV-2 by FDA under an Emergency Use Authorization (EUA). This EUA will remain  in effect (meaning this test can be used) for the duration of the COVID-19 declaration under Section 564(b)(1) of the Act, 21 U.S.C.section 360bbb-3(b)(1), unless the authorization is terminated  or revoked sooner.       Influenza A by PCR NEGATIVE NEGATIVE Final   Influenza B by PCR NEGATIVE NEGATIVE Final    Comment: (NOTE) The Xpert Xpress SARS-CoV-2/FLU/RSV plus assay is intended as an aid in the diagnosis of influenza from Nasopharyngeal swab specimens and should not be used as a sole basis for treatment. Nasal washings and aspirates are unacceptable for Xpert Xpress SARS-CoV-2/FLU/RSV testing.  Fact Sheet for Patients: EntrepreneurPulse.com.au  Fact Sheet for Healthcare Providers: IncredibleEmployment.be  This test is not yet approved or cleared by the [place] FDA and has been authorized for detection and/or diagnosis of SARS-CoV-2 by FDA under an Emergency Use Authorization (EUA). This EUA will remain in effect (meaning this test can be used) for the duration of the COVID-19 declaration  under Section 564(b)(1) of the Act, 21 U.S.C. section 360bbb-3(b)(1), unless the authorization is terminated or revoked.  Performed at [facility], [place] [address] [place], [place] [address]     Signed:  Dwyane Dee MD.  [facility] 10/10/2021, 1:49 PM

## 2021-10-11 LAB — ANTI-SMOOTH MUSCLE ANTIBODY, IGG: F-Actin IgG: 9 Units (ref 0–19)

## 2021-10-11 LAB — MITOCHONDRIAL ANTIBODIES: Mitochondrial M2 Ab, IgG: <20 Units (ref 0.0–20.0)

## 2021-10-11 MED ORDER — GENTAMICIN SULFATE 40 MG/ML IJ SOLN: 160.0000 mg | Freq: Once | INTRAVENOUS | Status: DC

## 2021-10-11 MED ORDER — OXYCODONE-ACETAMINOPHEN 5-325 MG PO TABS
2.0000 | ORAL_TABLET | Freq: Once | ORAL | Status: AC
  Administered 2021-10-11: 2 via ORAL
  Filled 2021-10-11: qty 2

## 2021-10-11 MED ORDER — GENTAMICIN SULFATE 40 MG/ML IJ SOLN
160.0000 mg | Freq: Once | INTRAMUSCULAR | Status: AC
  Administered 2021-10-11: 160 mg via INTRAMUSCULAR
  Filled 2021-10-11: qty 4

## 2021-10-11 MED ORDER — HYDROMORPHONE HCL 1 MG/ML IJ SOLN
1.0000 mg | Freq: Once | INTRAMUSCULAR | Status: AC
  Administered 2021-10-11: 1 mg via INTRAVENOUS
  Filled 2021-10-11: qty 1

## 2021-10-11 NOTE — Discharge Instructions (Signed)
Continue irrigating foley catheter at home as instructed. Follow-up with urology as scheduled. Return to ED if further issues with foley.

## 2021-10-11 NOTE — Consult Note (Signed)
Urology Consult  Consulting MD: April Palombo, MD  CC: Catheter not draining  HPI: This is a 70year old male with complicated urologic history.  History of bladder cancer, status post TURBT proximately 2015 by Dr. Jacqlyn Larsen.  The patient received BCG.  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  Patient has had incision of a prostatic abscess.  He has multiple false passages and strictures.  He has recently had a catheter placed by Dr. Jeffie Pollock approximately 1 week ago.  This stopped draining later in the day today, having been recently discharged from the hospital.  There is been no blood in the urine.  He is uncomfortable from the catheter not draining and irrigation by the nursing staff in the emergency room has been unsuccessful.  PMH: Past Medical History:  Diagnosis Date   Cancer ([place])    bladder   Diabetes mellitus without complication ([place])     PSH: Past Surgical History:  Procedure Laterality Date   BIOPSY  09/24/2021   Procedure: BIOPSY;  Surgeon: Wilford Corner, MD;  Location: [facility];  Service: Endoscopy;;   BLADDER SURGERY     CYSTOSCOPY N/A 08/09/2021   Procedure: CYSTOSCOPY WITH INSERTION OF FOLEY CATHETER;  Surgeon: Janith Lima, MD;  Location: [facility];  Service: Urology;  Laterality: N/A;   ESOPHAGOGASTRODUODENOSCOPY (EGD) WITH PROPOFOL N/A 09/24/2021   Procedure: ESOPHAGOGASTRODUODENOSCOPY (EGD) WITH PROPOFOL;  Surgeon: Wilford Corner, MD;  Location: [facility];  Service: Endoscopy;  Laterality: N/A;   fatty tumor excision     TRANSURETHRAL RESECTION OF PROSTATE N/A 08/09/2021   Procedure: TRANSURETHRAL RESECTION OF THE PROSTATE (TURP);  Surgeon: Janith Lima, MD;  Location: [facility];  Service: Urology;  Laterality: N/A;    Allergies: Allergies  Allergen Reactions   Apple Anaphylaxis   Penicillins Swelling    Swelling of lip per patient    Medications: (Not in a hospital  admission)    Social History: Social History   Socioeconomic History   Marital status: Married    Spouse name: Not on file   Number of children: Not on file   Years of education: Not on file   Highest education level: Not on file  Occupational History   Not on file  Tobacco Use   Smoking status: Former   Smokeless tobacco: Never  Substance and Sexual Activity   Alcohol use: Not Currently   Drug use: Not on file   Sexual activity: Not on file  Other Topics Concern   Not on file  Social History Narrative   Not on file   Social Determinants of Health   Financial Resource Strain: Not on file  Food Insecurity: Not on file  Transportation Needs: Not on file  Physical Activity: Not on file  Stress: Not on file  Social Connections: Not on file  Intimate Partner Violence: Not on file    Family History: Family History  Problem Relation Age of Onset   Heart disease Neg Hx     Review of Systems: Positive: Urinary retention, catheter not draining, pelvic pain Negative:   A further 10 point review of systems was negative except what is listed in the HPI.  Physical Exam: @VITALS2 @ General: He is in moderate distress awake. Head:  Normocephalic.  Atraumatic. ENT:  EOMI.  Mucous membranes moist Neck:  Supple.  No lymphadenopathy. CV:  Regular rate. Pulmonary: Equal effort bilaterally.   Abdomen: Bladder nonpalpable Skin:  Normal turgor.  No visible rash. Extremity:  No gross deformity of extremities.  Neurologic: Alert. Appropriate mood. Penis:  Circumcised.  No lesions. Urethra: Orthotopic meatus.  Catheter present, not draining. Scrotum: No lesions.  No ecchymosis.  No erythema. Testicles: Descended bilaterally.  No masses bilaterally. Epididymis: Palpable bilaterally.  Non Tender to palpation.  Studies:  Recent Labs    10/09/21 0356 10/10/21 0456  HGB 10.5* 9.8*  WBC 3.8* 3.5*  PLT 238 229    Recent Labs    10/09/21 0356 10/10/21 0456  NA 139 136  K  4.3 4.3  CL 107 107  CO2 26 24  BUN 23 19  CREATININE 1.74* 1.69*  CALCIUM 8.5* 8.3*  GFRNONAA 42* 43*     Recent Labs    10/09/21 0356  INR 1.2     Invalid input(s): ABG  Procedure note: I first tried to irrigate the indwelling catheter that was present.  This would not irrigate.  I then removed it.  Penis was prepped and draped, 30 cc of 2% viscous lidocaine was administered.  Coud tip catheter, 29 [place] was attempted to be placed.  This unfortunately could not be placed within the bladder.  It was then removed.  Flexible cystoscope was then utilized to find the correct passage which was 12:00.  On the second try, I was able to negotiated into the bladder.  Once there, I passed a sensor tip guidewire.  Cystoscope was removed, and I negotiated an 69 [place] council tip catheter over top of the sensor tip guidewire and into the bladder.  Approximately 300 cc of dark urine obtained.  Balloon filled with 10 cc of water.  I then irrigated the catheter, and also taught him younger brother how to do this.  He will be sent home with a [place] syringe and saline.  The patient tolerated the procedure well.  Procedure was covered with 160 mg of gentamicin IM.  Assessment: Bladder neck contracture/false passages, history of prostatic abscess  Plan: 1.  I sent the patient home with irrigation supplies.  I recommended that they do this a couple of times a day  2.  He does have follow-up scheduled in the office  3.  I did let them know that he may be headed for an eventual cystectomy, as this is significantly impacting the patient's quality of life and long-term catheter management is most likely going to be difficult.    [phone]

## 2021-10-11 NOTE — ED Notes (Signed)
Urology cart at bedside 

## 2021-10-12 LAB — AFB ID BY DNA PROBE
M avium complex: NEGATIVE
M tuberculosis complex: POSITIVE — AB

## 2021-10-12 LAB — AFB CULTURE WITH SMEAR (NOT AT ARMC)
Acid Fast Culture: POSITIVE — AB
Acid Fast Smear: NEGATIVE

## 2021-10-15 ENCOUNTER — Telehealth: Payer: Self-pay

## 2021-10-15 ENCOUNTER — Ambulatory Visit: Payer: Medicare Other | Attending: Infectious Diseases | Admitting: Infectious Diseases

## 2021-10-15 ENCOUNTER — Other Ambulatory Visit: Payer: Self-pay

## 2021-10-15 VITALS — BP 117/78 | HR 110 | Temp 98.8°F | Resp 16 | Ht 71.0 in | Wt 157.4 lb

## 2021-10-15 DIAGNOSIS — Z8551 Personal history of malignant neoplasm of bladder: Secondary | ICD-10-CM | POA: Diagnosis not present

## 2021-10-15 DIAGNOSIS — T50A95A Adverse effect of other bacterial vaccines, initial encounter: Secondary | ICD-10-CM

## 2021-10-15 DIAGNOSIS — L89153 Pressure ulcer of sacral region, stage 3: Secondary | ICD-10-CM | POA: Diagnosis not present

## 2021-10-15 DIAGNOSIS — N136 Pyonephrosis: Secondary | ICD-10-CM | POA: Diagnosis present

## 2021-10-15 DIAGNOSIS — R7989 Other specified abnormal findings of blood chemistry: Secondary | ICD-10-CM | POA: Insufficient documentation

## 2021-10-15 DIAGNOSIS — E119 Type 2 diabetes mellitus without complications: Secondary | ICD-10-CM | POA: Diagnosis not present

## 2021-10-15 DIAGNOSIS — I10 Essential (primary) hypertension: Secondary | ICD-10-CM | POA: Insufficient documentation

## 2021-10-15 DIAGNOSIS — D649 Anemia, unspecified: Secondary | ICD-10-CM | POA: Insufficient documentation

## 2021-10-15 DIAGNOSIS — N3080 Other cystitis without hematuria: Secondary | ICD-10-CM | POA: Diagnosis not present

## 2021-10-15 DIAGNOSIS — L89152 Pressure ulcer of sacral region, stage 2: Secondary | ICD-10-CM | POA: Insufficient documentation

## 2021-10-15 LAB — HEMOCHROMATOSIS DNA-PCR(C282Y,H63D)

## 2021-10-15 NOTE — Telephone Encounter (Signed)
Patient seen in office today. 

## 2021-10-15 NOTE — Progress Notes (Signed)
20 NAME: Adam Chung  DOB: 15-Jun-1951  MRN: [id number]  Date/Time: 10/15/2021 10:35 AM   Subjective:  Patient here for follow-up.  BCG infection of the bladder.  He is here with his friend. I last saw him on 08/20/2021.  He started on triple therapy for BCG infection of the bladder on 08/28/2021 with INH, rifampin and ethambutol along with pyridoxine.  On October 13 he called complaining of poor appetite and hoarseness.  On October 23/22 got admitted to [facility] because of DKA.  He was discharged to [facility] on 09/30/2021.  During that hospitalization he was also diagnosed with Helicobacter pylori of the stomach and was treated for that.  He was also seen by urologist for foley cath blockage,  false passages etc He returned back to [facility] on 10/05/2021 with Foley catheter blockage. He was seen by urologist. During that hospitalization he was noted to have abnormal LFTs.  So the triple therapy was put on hold.  And is seeing me for follow-up. Patient states he lost a lot of weight when he was on antitubercular treatment.  His appetite was totally gone.  Since the last week he has began to eat again.  He is not interested in restarting the medication. He is going to talk to the urologist for bladder resection. The following is taken from the notes of his last visit OMKAR Chung is a 70 y.o. male with a history of Dm, HTN, bladder ca for which he was followed [facility] and had been treated with local BCG, then gemcitabine 2014-2019 is referred to me because of AFB in the bladder biopsy PT says in Aug he became incontinent and had to wear pads- on 8/11 labs were sent and UA had 4-10 wbc, creatinine was 2.3 ( baseline was 1.4)  , wbc was 8.1 . He saw his PCP Dr.Miller on 8/18 He was given a 7 day course of ciprofloxacin and CT scan was ordered and was done on 08/06/21. Moderate to severe bilateral hydroureteronephrosis likely secondary to bladder pathology.2. Bladder is moderately distended with  diffuse wall thickening and surrounding inflammatory changes. In addition, there is an abnormal appearance of the prostate with low-density in the prostate and seminal vesicles. Findings are suspicious for a poorly defined fluid collection in this area which could be associated with a dilated prostatic urethra, disruption of the prostatic urethra or even an abscess. . Pt was admitted to the hospital on 08/07/21. Urine cultre sent and he was placed on levaquin- Foley was placed- was seen by urology and taken for cystoscopy which showed bulbar urethral stricture, Entire bladder was covered with necrotic infectious rind raising concern for malignancy and resection was done of the necrotic rindB/l ureteral orifices was not visualized Bimanual examination of the prostate revealed a nodule on the rt aspect- pt was discharged home on PO levaquin and foley on 08/11/21. He was referred to me by Dr.Sninsky as the pathology reported AFB seen in the necrotic tissue.  I saw him on 08/20/2021    Past Medical History:  Diagnosis Date   Cancer [facility])    bladder   Diabetes mellitus without complication [facility])     Past Surgical History:  Procedure Laterality Date   BIOPSY  09/24/2021   Procedure: BIOPSY;  Surgeon: Wilford Corner, MD;  [facility];  Service: Endoscopy;;   BLADDER SURGERY     CYSTOSCOPY N/A 08/09/2021   Procedure: CYSTOSCOPY WITH INSERTION OF FOLEY CATHETER;  Surgeon: Janith Lima, MD;  Location: [facility];  Service: Urology;  Laterality: N/A;   ESOPHAGOGASTRODUODENOSCOPY (EGD) WITH PROPOFOL N/A 09/24/2021   Procedure: ESOPHAGOGASTRODUODENOSCOPY (EGD) WITH PROPOFOL;  Surgeon: Wilford Corner, MD;  [facility];  Service: Endoscopy;  Laterality: N/A;   fatty tumor excision     TRANSURETHRAL RESECTION OF PROSTATE N/A 08/09/2021   Procedure: TRANSURETHRAL RESECTION OF THE PROSTATE (TURP);  Surgeon: Janith Lima, MD;  Location: [facility];  Service: Urology;  Laterality: N/A;     Social History   Socioeconomic History   Marital status: Married    Spouse name: Not on file   Number of children: Not on file   Years of education: Not on file   Highest education level: Not on file  Occupational History   Not on file  Tobacco Use   Smoking status: Former   Smokeless tobacco: Never  Substance and Sexual Activity   Alcohol use: Not Currently   Drug use: Not on file   Sexual activity: Not on file  Other Topics Concern   Not on file  Social History Narrative   Not on file   Social Determinants of Health   Financial Resource Strain: Not on file  Food Insecurity: Not on file  Transportation Needs: Not on file  Physical Activity: Not on file  Stress: Not on file  Social Connections: Not on file  Intimate Partner Violence: Not on file  Family history reviewed- nothing pertinent  Allergies  Allergen Reactions   Apple Anaphylaxis   Penicillins Swelling    Swelling of lip per patient   I? Current Outpatient Medications  Medication Sig Dispense Refill   acetaminophen (TYLENOL) 325 MG tablet Take 2 tablets (650 mg total) by mouth every 6 (six) hours as needed for mild pain (or Fever >/= 101).     Cholecalciferol 250 MCG (10000 UT) TABS Take 3,000 Units by mouth daily.     Ensure Max Protein (ENSURE MAX PROTEIN) LIQD Take 330 mLs (11 oz total) by mouth 2 (two) times daily. (Patient taking differently: Take 11 oz by mouth daily.)     glimepiride (AMARYL) 4 MG tablet Take 4 mg by mouth daily with breakfast.     ondansetron (ZOFRAN ODT) 8 MG disintegrating tablet Take 1 tablet (8 mg total) by mouth every 8 (eight) hours as needed for nausea or vomiting. 30 tablet 2   tamsulosin (FLOMAX) 0.4 MG CAPS capsule Take 0.4 mg by mouth daily.     insulin aspart (NOVOLOG) 100 UNIT/ML FlexPen Inject 4 Units into the skin 3 (three) times daily with meals. Give only if patient consumes > 50% of meals. (Patient not taking: Reported on 10/15/2021) 15 mL 11   insulin  glargine-yfgn (SEMGLEE) 100 UNIT/ML injection Inject 0.1 mLs (10 Units total) into the skin daily. (Patient not taking: Reported on 10/15/2021) 10 mL 0   Multiple Vitamin (MULTIVITAMIN WITH MINERALS) TABS tablet Take 1 tablet by mouth daily. (Patient not taking: Reported on 10/15/2021)     Nutritional Supplements (,FEEDING SUPPLEMENT, PROSOURCE PLUS) liquid Take 30 mLs by mouth 2 (two) times daily between meals. (Patient not taking: Reported on 10/11/2021)     senna-docusate (SENOKOT-S) 8.6-50 MG tablet Take 1 tablet by mouth 2 (two) times daily. (Patient not taking: Reported on 10/11/2021)     No current facility-administered medications for this visit.     Abtx:  Anti-infectives (From admission, onward)    None       REVIEW OF SYSTEMS:  Const: negative fever, negative chills, patient has  already lost 10 pounds came to see me on 08/20/2021.  His weight then was 177 pounds. eyes: negative diplopia or visual changes, negative eye pain ENT: negative coryza, negative sore throat Resp: negative cough, hemoptysis, dyspnea Cards: negative for chest pain, palpitations, lower extremity edema GU: Urinary retention and incontinence.  Now has foley GI: Negative for abdominal pain, diarrhea, bleeding, constipation.  Poor appetite Skin: negative for rash and pruritus Heme: negative for easy bruising and gum/nose bleeding MS: weak, in wheelchair. Not able to walk well.  Neurolo:negative for headaches, dizziness, vertigo, memory problems  Psych:  anxiety, depression  Endocrine: diabetes Allergy/Immunology- PCN Objective:  VITALS:   BP 117/78   Pulse (!) 110   Temp 98.8 F (37.1 C)   Resp 16   Ht 5\' 11"  (1.803 m)   Wt 157 lb 6.4 oz (71.4 kg)   SpO2 97%   BMI 21.95 kg/m   PHYSICAL EXAM:  General: Patient is in wheelchair. Chronically ill. .Alert and oriented  Head: Normocephalic, without obvious abnormality, atraumatic. Eyes: Conjunctivae clear, anicteric sclerae. Pupils are  equal ENT Nares normal. No drainage or sinus tenderness. Lips, mucosa, and tongue normal. No Thrush Neck: Supple, symmetrical, no adenopathy, thyroid: non tender no carotid bruit and no JVD. Back: No CVA tenderness. Lungs: Clear to auscultation bilaterally. No Wheezing or Rhonchi. No rales. Heart: Regular rate and rhythm, no murmur, rub or gallop.  Tachycardia Abdomen: Soft, non-tender,not distended. Bowel sounds normal. No masses, Foley in place Extremities: atraumatic, no cyanosis. No edema. No clubbing Skin: No rashes or lesions. Or bruising Lymph: Cervical, supraclavicular normal. Neurologic: Grossly non-focal Pertinent Labs Lab Results CBC    Component Value Date/Time   WBC 3.5 (L) 10/10/2021 0456   RBC 3.37 (L) 10/10/2021 0456   HGB 9.8 (L) 10/10/2021 0456   HCT 31.2 (L) 10/10/2021 0456   PLT 229 10/10/2021 0456   MCV 92.6 10/10/2021 0456   MCH 29.1 10/10/2021 0456   MCHC 31.4 10/10/2021 0456   RDW 16.2 (H) 10/10/2021 0456   LYMPHSABS 1.0 10/10/2021 0456   MONOABS 0.4 10/10/2021 0456   EOSABS 0.0 10/10/2021 0456   BASOSABS 0.0 10/10/2021 0456    CMP Latest Ref Rng & Units 10/10/2021 10/09/2021 10/08/2021  Glucose 70 - 99 mg/dL 195(H) 105(H) 69(L)  BUN 8 - 23 mg/dL 19 23 28(H)  Creatinine 0.61 - 1.24 mg/dL 1.69(H) 1.74(H) 1.79(H)  Sodium 135 - 145 mmol/L 136 139 138  Potassium 3.5 - 5.1 mmol/L 4.3 4.3 4.5  Chloride 98 - 111 mmol/L 107 107 104  CO2 22 - 32 mmol/L 24 26 28   Calcium 8.9 - 10.3 mg/dL 8.3(L) 8.5(L) 8.9  Total Protein 6.5 - 8.1 g/dL 5.5(L) 5.9(L) 5.9(L)  Total Bilirubin 0.3 - 1.2 mg/dL 1.4(H) 1.8(H) 3.2(H)  Alkaline Phos 38 - 126 U/L 155(H) 158(H) 140(H)  AST 15 - 41 U/L 99(H) 200(H) 180(H)  ALT 0 - 44 U/L 114(H) 135(H) 126(H)    Biopsy of bladder with HPE NECROTIC DEBRIS.  - EXTREMELY SCANT POORLY PRESERVED TISSUE FRAGMENTS WITHOUT DIAGNOSTIC  FEATURES the stromal inflammatory infiltrate has areas resembling a  granulomatous reaction although no  definite granulomas are identified There are abundant large groups of AFB colonizing the surface of the  necrotic fragments.  A rare macrophage shows intracellular AFB. The histologic features are  very unusual, but it basically looks like massive caseous necrosis.  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  IMAGING RESULTS: CT abdomen and pelvis- 09/20/21 Diffuse mild wall thickening of the urinary bladder with mild adjacent fat stranding, suggestive of recurrent cystitis. 2. Unchanged bilateral severe hydroureteronephrosis. No nephrolithiasis or perinephric fat stranding. ? 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  Susceptibility pending. Chest x-ray normal on 08/18/2021.  Currently no evidence of systemic infection Will get CXR Patient started triple therapy with INH, rifampin and ethambutol along with pyridoxine on 08/28/21   He took it for 2 weeks and then had GI symptoms including nausea loss of appetite and hence stopped taking it.  He had to go to the hospital after that for DKA.  When he was discharged he was asked to continue with the medication.  He recently got admitted to [facility] Foley obstruction.  At that time LFTs were abnormal and they stopped treatment. Patient does not want to go back on the triple therapy now.  He says his appetite is much improved since he stopped it. Is going to opt for bladder resection.  I told him that even though his bladder may be removed.  We are not confident  that it will not be extending into the ureter and hence he may still need medication. He is reluctant to start medicines Will discuss with his urologist Dr. Tresa Moore and will decide on further management..  Patient did not want any labs done today.  He has abnormal LFTs and need to follow that but is not interested.   Anemia- Hb around 9-10 ( used to be 14  )   DM on sulfonylurea/ insulin started when he was in the hospital but has not received any medication since his discharge.  He says he will get around the 18th.  HTN-  Patient apparently has a sacral decubitus stage II and III but he did not want me to examine.  Discussed with the patient and his friend Inez Catalina in the clinic. Then called his daughter and talk to her in great detail about his condition, his refusal to start antitubercular treatment, but I will discuss with Dr. Tresa Moore regarding further management. He needs LFTs and blood for AFB Follow-up appointment will be given once I have the discussion with the urologist. ?Note:  This document was prepared using Dragon voice recognition software and may include unintentional dictation errors.

## 2021-10-15 NOTE — Patient Instructions (Signed)
You are here for follow up of BCG infection of the bladder- you have stopped the antibiotics since 11/6 because of poor appetite and abnormal liver test . Your appetite has impoved and you are feeling better You are going to discuss with Dr.Manny regarding bladder removal- I am going to talk with him and we will come up with a plan- until then as per your decision will hold off on the medication that was given for BCG infection of the bladder

## 2021-10-24 ENCOUNTER — Emergency Department (HOSPITAL_COMMUNITY)
Admission: EM | Admit: 2021-10-24 | Discharge: 2021-10-25 | Disposition: A | Discharge status: Home / Self Care | Payer: Medicare Other | Attending: Emergency Medicine | Admitting: Emergency Medicine

## 2021-10-24 ENCOUNTER — Other Ambulatory Visit: Payer: Self-pay

## 2021-10-24 DIAGNOSIS — R339 Retention of urine, unspecified: Secondary | ICD-10-CM | POA: Diagnosis not present

## 2021-10-24 DIAGNOSIS — N184 Chronic kidney disease, stage 4 (severe): Secondary | ICD-10-CM | POA: Insufficient documentation

## 2021-10-24 DIAGNOSIS — Z79899 Other long term (current) drug therapy: Secondary | ICD-10-CM | POA: Insufficient documentation

## 2021-10-24 DIAGNOSIS — Z87891 Personal history of nicotine dependence: Secondary | ICD-10-CM | POA: Insufficient documentation

## 2021-10-24 DIAGNOSIS — T83511A Infection and inflammatory reaction due to indwelling urethral catheter, initial encounter: Secondary | ICD-10-CM

## 2021-10-24 DIAGNOSIS — T839XXA Unspecified complication of genitourinary prosthetic device, implant and graft, initial encounter: Secondary | ICD-10-CM

## 2021-10-24 DIAGNOSIS — Z8551 Personal history of malignant neoplasm of bladder: Secondary | ICD-10-CM | POA: Diagnosis not present

## 2021-10-24 DIAGNOSIS — E1122 Type 2 diabetes mellitus with diabetic chronic kidney disease: Secondary | ICD-10-CM | POA: Diagnosis not present

## 2021-10-24 DIAGNOSIS — Z794 Long term (current) use of insulin: Secondary | ICD-10-CM | POA: Insufficient documentation

## 2021-10-24 DIAGNOSIS — I129 Hypertensive chronic kidney disease with stage 1 through stage 4 chronic kidney disease, or unspecified chronic kidney disease: Secondary | ICD-10-CM | POA: Insufficient documentation

## 2021-10-24 NOTE — ED Notes (Signed)
Bladder scan volume: 0 mL. 

## 2021-10-24 NOTE — ED Provider Notes (Signed)
Emergency Medicine Provider Triage Evaluation Note  Adam Chung , a 70 y.o. male  was evaluated in triage.  Pt complains of obstructed Foley catheter.  Patient with complex urologic history due to anatomy, persistent Foley catheter who presents for evaluation of Foley catheter not draining.  Has not drained since early this afternoon.  He does not have any pain.  Bladder scan here 0.  Tech was able to flush ear with some drainage.  No fever  Review of Systems  Positive: Foley cath obstruction Negative: Fever, abd pain  Physical Exam  BP 135/82 (BP Location: Left Arm)   Pulse (!) 109   Temp 99 F (37.2 C) (Oral)   Resp 16   Ht 5\' 11"  (1.803 m)   Wt 71.2 kg   SpO2 98%   BMI 21.90 kg/m  Gen:   Awake, no distress   Resp:  Normal effort  ABD:  Foley catheter draining dark yellow urine MSK:   Moves extremities without difficulty  Other:    Medical Decision Making  Medically screening exam initiated at 11:05 PM.  Appropriate orders placed.  TRENTAN TRIPPE was informed that the remainder of the evaluation will be completed by another provider, this initial triage assessment does not replace that evaluation, and the importance of remaining in the ED until their evaluation is complete.  Patient possible Foley catheter obstruction, known complex urologic anatomy.  Bladder scan here 0, Foley catheter is draining some dark urine.  Sent for UA   Carel Schnee A, PA-C 10/24/21 2306    Margette Fast, MD 10/26/21 1306

## 2021-10-24 NOTE — ED Triage Notes (Signed)
Report low urine output with possible obstruction.

## 2021-10-25 ENCOUNTER — Encounter (HOSPITAL_COMMUNITY): Payer: Self-pay

## 2021-10-25 LAB — URINALYSIS, ROUTINE W REFLEX MICROSCOPIC
Bilirubin Urine: NEGATIVE
Glucose, UA: >=500 mg/dL — AB
Ketones, ur: NEGATIVE mg/dL
Nitrite: NEGATIVE
Protein, ur: 30 mg/dL — AB
RBC / HPF: >50 RBC/hpf — ABNORMAL HIGH (ref 0–5)
Specific Gravity, Urine: 1.011 (ref 1.005–1.030)
WBC, UA: >50 WBC/hpf — ABNORMAL HIGH (ref 0–5)
pH: 5 (ref 5.0–8.0)

## 2021-10-25 MED ORDER — CIPROFLOXACIN HCL 500 MG PO TABS
500.0000 mg | ORAL_TABLET | Freq: Once | ORAL | Status: AC
  Administered 2021-10-25: 02:00:00 500 mg via ORAL
  Filled 2021-10-25: qty 1

## 2021-10-25 MED ORDER — CIPROFLOXACIN HCL 500 MG PO TABS: 500.0000 mg | ORAL_TABLET | Freq: Two times a day (BID) | ORAL | 0 refills | Status: DC

## 2021-10-25 NOTE — Discharge Instructions (Signed)
Begin taking Cipro as prescribed.  Increase your fluid intake by several glasses of water per day for the next several days.  Follow-up with your urologist in the next week, and return to the emergency department if symptoms significantly worsen or change.

## 2021-10-25 NOTE — ED Notes (Signed)
Pt discharged. Instructions and prescriptions given. AAOX4. Pt in no apparent distress or pain. The opportunity to ask questions was provided.  

## 2021-10-25 NOTE — ED Provider Notes (Signed)
[facility] DEPT Provider Note   CSN: [id number] Arrival date & time: 10/24/21  2218     History Chief Complaint  Patient presents with   Urinary Retention    Adam Chung is a 70 y.o. male.  Patient is a 70 year old male with past medical history of diabetes, bladder cancer with indwelling Foley catheter, GERD.  Patient presenting today for evaluation of obstructed Foley catheter.  He has not had urine output since early this afternoon.  He is concerned it may be obstructed.  He denies he is having any discomfort.  He denies any fevers or chills.  He has had issues with Foley catheter occlusions in the past that have caused him significant discomfort and wanted to have this checked out before this situation reached that point.  The history is provided by the patient.      Past Medical History:  Diagnosis Date   Cancer [facility])    bladder   Diabetes mellitus without complication [facility])     Patient Active Problem List   Diagnosis Date Noted   Drug-induced liver injury 10/07/2021   GERD (gastroesophageal reflux disease) 10/06/2021   H. 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   Abnormal CT scan, stomach 09/24/2021   Malnutrition of moderate degree 09/24/2021   DKA (diabetic ketoacidosis) ([place]) 09/20/2021   Uncontrolled type 2 diabetes mellitus with hyperglycemia ([place]) 09/20/2021   Normocytic anemia 09/20/2021   Hyperkalemia 09/20/2021   Thrombocytosis 09/20/2021   Trigeminy 09/20/2021   Hyperphosphatemia 09/20/2021   Hypermagnesemia 09/20/2021   Pressure injury of skin 09/20/2021   Hypertension 08/07/2021   Type 2 diabetes mellitus with stage 4 chronic kidney disease, with long-term current use  of insulin ([place]) 08/07/2021   AKI (acute kidney injury) ([place]) 08/07/2021   Urinary retention 08/07/2021    Past Surgical History:  Procedure Laterality Date   BIOPSY  09/24/2021   Procedure: BIOPSY;  Surgeon: Wilford Corner, MD;  Location: [facility];  Service: Endoscopy;;   BLADDER SURGERY     CYSTOSCOPY N/A 08/09/2021   Procedure: CYSTOSCOPY WITH INSERTION OF FOLEY CATHETER;  Surgeon: Janith Lima, MD;  Location: [facility] ORS;  Service: Urology;  Laterality: N/A;   ESOPHAGOGASTRODUODENOSCOPY (EGD) WITH PROPOFOL N/A 09/24/2021   Procedure: ESOPHAGOGASTRODUODENOSCOPY (EGD) WITH PROPOFOL;  Surgeon: Wilford Corner, MD;  Location: [facility];  Service: Endoscopy;  Laterality: N/A;   fatty tumor excision     TRANSURETHRAL RESECTION OF PROSTATE N/A 08/09/2021   Procedure: TRANSURETHRAL RESECTION OF THE PROSTATE (TURP);  Surgeon: Janith Lima, MD;  Location: [facility] ORS;  Service: Urology;  Laterality: N/A;       Family History  Problem Relation Age of Onset   Heart disease Neg Hx     Social History   Tobacco Use   Smoking status: Former   Smokeless tobacco: Never  Substance Use Topics   Alcohol use: Not Currently    Home Medications Prior to Admission medications   Medication Sig Start Date End Date Taking? Authorizing Provider  ciprofloxacin (CIPRO) 500 MG tablet Take 1 tablet (500 mg total) by mouth 2 (two) times daily. One po bid x 7 days 10/25/21  Yes Tiffny Gemmer, Nathaneil Canary, MD  acetaminophen (TYLENOL) 325 MG tablet Take 2 tablets (650 mg total) by mouth  every 6 (six) hours as needed for mild pain (or Fever >/= 101). 09/29/21   Eugenie Filler, MD  Cholecalciferol 250 MCG (10000 UT) TABS Take 3,000 Units by mouth daily.    [provider]  Ensure Max Protein (ENSURE MAX PROTEIN) LIQD Take 330 mLs (11 oz total) by mouth 2 (two) times daily. Patient taking differently: Take 11 oz by mouth daily. 09/29/21   Eugenie Filler, MD  glimepiride (AMARYL) 4 MG tablet Take 4  mg by mouth daily with breakfast.    [provider]  insulin aspart (NOVOLOG) 100 UNIT/ML FlexPen Inject 4 Units into the skin 3 (three) times daily with meals. Give only if patient consumes > 50% of meals. Patient not taking: Reported on 10/15/2021 09/29/21   Eugenie Filler, MD  insulin glargine-yfgn [facility]) 100 UNIT/ML injection Inject 0.1 mLs (10 Units total) into the skin daily. Patient not taking: Reported on 10/15/2021 09/30/21   Eugenie Filler, MD  Multiple Vitamin (MULTIVITAMIN WITH MINERALS) TABS tablet Take 1 tablet by mouth daily. Patient not taking: Reported on 10/15/2021 09/30/21   Eugenie Filler, MD  Nutritional Supplements (,FEEDING SUPPLEMENT, PROSOURCE PLUS) liquid Take 30 mLs by mouth 2 (two) times daily between meals. Patient not taking: Reported on 10/11/2021 09/30/21   Eugenie Filler, MD  ondansetron (ZOFRAN ODT) 8 MG disintegrating tablet Take 1 tablet (8 mg total) by mouth every 8 (eight) hours as needed for nausea or vomiting. 09/10/21   Kuppelweiser, Cassie L, RPH-CPP  senna-docusate (SENOKOT-S) 8.6-50 MG tablet Take 1 tablet by mouth 2 (two) times daily. Patient not taking: Reported on 10/11/2021 09/29/21   Eugenie Filler, MD  tamsulosin (FLOMAX) 0.4 MG CAPS capsule Take 0.4 mg by mouth daily.    [provider]    Allergies    Apple and Penicillins  Review of Systems   Review of Systems  All other systems reviewed and are negative.  Physical Exam Updated Vital Signs BP 135/82 (BP Location: Left Arm)   Pulse (!) 109   Temp 99 F (37.2 C) (Oral)   Resp 16   Ht 5\' 11"  (1.803 m)   Wt 71.2 kg   SpO2 98%   BMI 21.90 kg/m   Physical Exam Vitals and nursing note reviewed.  Constitutional:      General: He is not in acute distress.    Appearance: He is well-developed. He is not diaphoretic.  HENT:     Head: Normocephalic and atraumatic.  Cardiovascular:     Rate and Rhythm: Normal rate and regular rhythm.     Heart  sounds: No murmur heard.   No friction rub.  Pulmonary:     Effort: Pulmonary effort is normal. No respiratory distress.     Breath sounds: Normal breath sounds. No wheezing or rales.  Abdominal:     General: Bowel sounds are normal. There is no distension.     Palpations: Abdomen is soft.     Tenderness: There is no abdominal tenderness.  Musculoskeletal:        General: Normal range of motion.     Cervical back: Normal range of motion and neck supple.  Skin:    General: Skin is warm and dry.  Neurological:     Mental Status: He is alert and oriented to person, place, and time.     Coordination: Coordination normal.    ED Results / Procedures / Treatments   Labs (all labs ordered are listed, but only abnormal results  are displayed) Labs Reviewed  URINALYSIS, ROUTINE W REFLEX MICROSCOPIC - Abnormal; Notable for the following components:      Result Value   APPearance HAZY (*)    Glucose, UA >=500 (*)    Hgb urine dipstick LARGE (*)    Protein, ur 30 (*)    Leukocytes,Ua LARGE (*)    RBC / HPF >50 (*)    WBC, UA >50 (*)    Bacteria, UA RARE (*)    All other components within normal limits  URINE CULTURE    EKG None  Radiology No results found.  Procedures Procedures   Medications Ordered in ED Medications  ciprofloxacin (CIPRO) tablet 500 mg (has no administration in time range)    ED Course  I have reviewed the triage vital signs and the nursing notes.  Pertinent labs & imaging results that were available during my care of the patient were reviewed by me and considered in my medical decision making (see chart for details).    MDM Rules/Calculators/A&P  Patient presenting with possible occluded Foley catheter.  He has history of bladder cancer and complex urethral anatomy.  Bladder scan performed in triage showed an empty bladder.  Patient then consumed 2 bottles of water in the waiting room, and began to have urine output.  He is not having any discomfort,  fever, or distention.  His abdomen is benign.  Urinalysis is suggestive of a UTI, whether this is colonization or actual infection I am uncertain.  Either way, I feel as though starting an antibiotic is appropriate.  Patient will be given Cipro pending urine culture.  Prescription sent to his pharmacy.  To follow-up with urology next week and return if he experiences additional problems.  Final Clinical Impression(s) / ED Diagnoses Final diagnoses:  None    Rx / DC Orders ED Discharge Orders          Ordered    ciprofloxacin (CIPRO) 500 MG tablet  2 times daily        10/25/21 0116             Veryl Speak, MD 10/25/21 [phone]

## 2021-10-26 LAB — URINE CULTURE

## 2021-10-27 ENCOUNTER — Other Ambulatory Visit: Payer: Self-pay | Admitting: Urology

## 2021-11-02 ENCOUNTER — Telehealth: Payer: Self-pay | Admitting: Urology

## 2021-11-12 ENCOUNTER — Telehealth: Payer: Self-pay

## 2021-11-12 NOTE — Telephone Encounter (Signed)
Spoke to patient regarding appt with Urologist and the plan moving forward. He said he is scheduled on 11/26/2021. He is not sure if he will need additional antibiotics but if he needs [place] or Dr Tresa Moore has questions they will contact Dr Delaine Lame.

## 2021-11-22 ENCOUNTER — Emergency Department (HOSPITAL_COMMUNITY)
Admission: EM | Admit: 2021-11-22 | Discharge: 2021-11-22 | Disposition: A | Discharge status: Home / Self Care | Payer: Medicare Other | Attending: Emergency Medicine | Admitting: Emergency Medicine

## 2021-11-22 ENCOUNTER — Encounter (HOSPITAL_COMMUNITY): Payer: Self-pay | Admitting: Emergency Medicine

## 2021-11-22 ENCOUNTER — Emergency Department (HOSPITAL_COMMUNITY): Payer: Medicare Other

## 2021-11-22 DIAGNOSIS — N1330 Unspecified hydronephrosis: Secondary | ICD-10-CM | POA: Diagnosis not present

## 2021-11-22 DIAGNOSIS — N184 Chronic kidney disease, stage 4 (severe): Secondary | ICD-10-CM | POA: Insufficient documentation

## 2021-11-22 DIAGNOSIS — Z7984 Long term (current) use of oral hypoglycemic drugs: Secondary | ICD-10-CM | POA: Diagnosis not present

## 2021-11-22 DIAGNOSIS — N5089 Other specified disorders of the male genital organs: Secondary | ICD-10-CM

## 2021-11-22 DIAGNOSIS — I129 Hypertensive chronic kidney disease with stage 1 through stage 4 chronic kidney disease, or unspecified chronic kidney disease: Secondary | ICD-10-CM | POA: Insufficient documentation

## 2021-11-22 DIAGNOSIS — Z8551 Personal history of malignant neoplasm of bladder: Secondary | ICD-10-CM | POA: Diagnosis not present

## 2021-11-22 DIAGNOSIS — N432 Other hydrocele: Secondary | ICD-10-CM | POA: Diagnosis not present

## 2021-11-22 DIAGNOSIS — Z794 Long term (current) use of insulin: Secondary | ICD-10-CM | POA: Insufficient documentation

## 2021-11-22 DIAGNOSIS — R1031 Right lower quadrant pain: Secondary | ICD-10-CM

## 2021-11-22 DIAGNOSIS — Z87891 Personal history of nicotine dependence: Secondary | ICD-10-CM | POA: Insufficient documentation

## 2021-11-22 DIAGNOSIS — N50819 Testicular pain, unspecified: Secondary | ICD-10-CM

## 2021-11-22 DIAGNOSIS — E1122 Type 2 diabetes mellitus with diabetic chronic kidney disease: Secondary | ICD-10-CM | POA: Insufficient documentation

## 2021-11-22 LAB — CBC WITH DIFFERENTIAL/PLATELET
Abs Immature Granulocytes: 0.02 10*3/uL (ref 0.00–0.07)
Basophils Absolute: 0 10*3/uL (ref 0.0–0.1)
Basophils Relative: 1 %
Eosinophils Absolute: 0.1 10*3/uL (ref 0.0–0.5)
Eosinophils Relative: 1 %
HCT: 43.9 % (ref 39.0–52.0)
Hemoglobin: 14.4 g/dL (ref 13.0–17.0)
Immature Granulocytes: 0 %
Lymphocytes Relative: 14 %
Lymphs Abs: 1.1 10*3/uL (ref 0.7–4.0)
MCH: 29 pg (ref 26.0–34.0)
MCHC: 32.8 g/dL (ref 30.0–36.0)
MCV: 88.3 fL (ref 80.0–100.0)
Monocytes Absolute: 0.6 10*3/uL (ref 0.1–1.0)
Monocytes Relative: 8 %
Neutro Abs: 5.9 10*3/uL (ref 1.7–7.7)
Neutrophils Relative %: 76 %
Platelets: 218 10*3/uL (ref 150–400)
RBC: 4.97 MIL/uL (ref 4.22–5.81)
RDW: 14.4 % (ref 11.5–15.5)
WBC: 7.7 10*3/uL (ref 4.0–10.5)
nRBC: 0 % (ref 0.0–0.2)

## 2021-11-22 LAB — URINALYSIS, ROUTINE W REFLEX MICROSCOPIC
Bilirubin Urine: NEGATIVE
Glucose, UA: NEGATIVE mg/dL
Ketones, ur: NEGATIVE mg/dL
Nitrite: POSITIVE — AB
Protein, ur: 30 mg/dL — AB
Specific Gravity, Urine: 1.005 (ref 1.005–1.030)
WBC, UA: >50 WBC/hpf — ABNORMAL HIGH (ref 0–5)
pH: 6 (ref 5.0–8.0)

## 2021-11-22 LAB — COMPREHENSIVE METABOLIC PANEL
ALT: 13 U/L (ref 0–44)
AST: 15 U/L (ref 15–41)
Albumin: 3 g/dL — ABNORMAL LOW (ref 3.5–5.0)
Alkaline Phosphatase: 82 U/L (ref 38–126)
Anion gap: 9 (ref 5–15)
BUN: 22 mg/dL (ref 8–23)
CO2: 27 mmol/L (ref 22–32)
Calcium: 8.6 mg/dL — ABNORMAL LOW (ref 8.9–10.3)
Chloride: 98 mmol/L (ref 98–111)
Creatinine, Ser: 1.43 mg/dL — ABNORMAL HIGH (ref 0.61–1.24)
GFR, Estimated: 53 mL/min — ABNORMAL LOW (ref >60)
Glucose, Bld: 117 mg/dL — ABNORMAL HIGH (ref 70–99)
Potassium: 3.8 mmol/L (ref 3.5–5.1)
Sodium: 134 mmol/L — ABNORMAL LOW (ref 135–145)
Total Bilirubin: 1.1 mg/dL (ref 0.3–1.2)
Total Protein: 7.5 g/dL (ref 6.5–8.1)

## 2021-11-22 LAB — LIPASE, BLOOD: Lipase: 32 U/L (ref 11–51)

## 2021-11-22 MED ORDER — SODIUM CHLORIDE 0.9 % IV BOLUS
1000.0000 mL | Freq: Once | INTRAVENOUS | Status: AC
  Administered 2021-11-22: 16:00:00 1000 mL via INTRAVENOUS

## 2021-11-22 MED ORDER — SODIUM CHLORIDE 0.9 % IV SOLN
1.0000 g | Freq: Once | INTRAVENOUS | Status: AC
  Administered 2021-11-22: 17:00:00 1 g via INTRAVENOUS
  Filled 2021-11-22: qty 10

## 2021-11-22 MED ORDER — SODIUM CHLORIDE (PF) 0.9 % IJ SOLN
INTRAMUSCULAR | Status: AC
  Filled 2021-11-22: qty 50

## 2021-11-22 MED ORDER — IOHEXOL 350 MG/ML SOLN
80.0000 mL | Freq: Once | INTRAVENOUS | Status: AC | PRN
  Administered 2021-11-22: 15:00:00 80 mL via INTRAVENOUS

## 2021-11-22 MED ORDER — CIPROFLOXACIN HCL 500 MG PO TABS: 500.0000 mg | ORAL_TABLET | Freq: Two times a day (BID) | ORAL | 0 refills | Status: DC

## 2021-11-22 NOTE — ED Triage Notes (Signed)
Complains of R lower abdominal pain, R testicle swelling for a little bit, but it is bigger today than it has been. Reports he can feel gas pain but cannot pass it, last BM the day before yesterday. Reports intermittent hematuria, ongoing issue. Denies N/V/D, or fevers.

## 2021-11-22 NOTE — ED Provider Notes (Signed)
[facility] DEPT Provider Note   CSN: [id number] Arrival date & time: 11/22/21  1203     History No chief complaint on file.   Adam Chung is a 70 y.o. male with a past medical history of bladder cancer and diabetes who presents to the ED complaining of intermittent, right lower quadrant abdominal pain x3 days.  His right lower quadrant abdominal pain radiates to his umbilicus. Patient has associated right testicular swelling and mild testicular pain.  Has not tried any medications for symptoms.  Denies chest pain, shortness of breath, fever, chills, nausea, vomiting, diarrhea, constipation, dyschezia, testicular pain, penile pain, penile swelling.  Denies history of similar symptoms.  Patient still has his appendix however he does not have his gallbladder as it was surgically removed.  Per patient chart review: Patient has surgery scheduled for 11/26/2021 for removal of his bladder due to history of bladder cancer.   The history is provided by the patient. No language interpreter was used.  Abdominal Pain Pain location:  RLQ Pain quality comment:  Piercing Pain radiates to:  Periumbilical region Pain severity:  Moderate Onset quality:  Gradual Duration:  3 days Timing:  Intermittent Progression:  Unchanged Chronicity:  New Context: not sick contacts and not suspicious food intake   Ineffective treatments:  None tried Associated symptoms: no chest pain, no chills, no constipation, no diarrhea, no dysuria, no fever, no hematochezia, no hematuria, no nausea, no shortness of breath and no vomiting       Past Medical History:  Diagnosis Date   Cancer ([place])    bladder   Diabetes mellitus without complication ([place])     Patient Active Problem List   Diagnosis Date Noted   Drug-induced liver injury 10/07/2021   GERD (gastroesophageal reflux disease) 10/06/2021   H. 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   Abnormal CT scan, stomach 09/24/2021   Malnutrition of moderate degree 09/24/2021   DKA (diabetic ketoacidosis) ([place]) 09/20/2021   Uncontrolled type 2 diabetes mellitus with hyperglycemia ([place]) 09/20/2021   Normocytic anemia 09/20/2021   Hyperkalemia 09/20/2021   Thrombocytosis 09/20/2021   Trigeminy 09/20/2021   Hyperphosphatemia 09/20/2021   Hypermagnesemia 09/20/2021   Pressure injury of skin 09/20/2021   Hypertension 08/07/2021   Type 2 diabetes mellitus with stage 4 chronic kidney disease, with long-term current use of insulin ([place]) 08/07/2021   AKI (acute kidney injury) ([place]) 08/07/2021   Urinary retention 08/07/2021    Past Surgical History:  Procedure Laterality Date   BIOPSY  09/24/2021   Procedure: BIOPSY;  Surgeon: Wilford Corner, MD;  [facility];  Service: Endoscopy;;   BLADDER SURGERY     CYSTOSCOPY N/A 08/09/2021   Procedure: CYSTOSCOPY WITH INSERTION OF FOLEY CATHETER;  Surgeon: Janith Lima, MD;  Location: [facility] ORS;  Service: Urology;  Laterality: N/A;   ESOPHAGOGASTRODUODENOSCOPY (EGD) WITH PROPOFOL N/A 09/24/2021   Procedure: ESOPHAGOGASTRODUODENOSCOPY (EGD) WITH PROPOFOL;  Surgeon: Wilford Corner, MD;  [facility]: [facility];  Service: Endoscopy;  Laterality: N/A;   fatty tumor excision     TRANSURETHRAL RESECTION OF PROSTATE N/A 08/09/2021   Procedure: TRANSURETHRAL RESECTION OF THE PROSTATE (TURP);  Surgeon: Janith Lima, MD;  Location: [facility] ORS;  Service: Urology;  Laterality: N/A;  Family History  Problem Relation Age of Onset   Heart disease Neg Hx     Social History   Tobacco Use   Smoking status: Former   Smokeless tobacco: Never  Substance Use Topics   Alcohol use: Not Currently    Home Medications Prior  to Admission medications   Medication Sig Start Date End Date Taking? Authorizing Provider  glimepiride (AMARYL) 4 MG tablet Take 4 mg by mouth daily with breakfast.   Yes [provider]  insulin detemir (LEVEMIR FLEXTOUCH) 100 UNIT/ML FlexPen Inject 15 Units into the skin 2 (two) times daily.   Yes [provider]  acetaminophen (TYLENOL) 325 MG tablet Take 2 tablets (650 mg total) by mouth every 6 (six) hours as needed for mild pain (or Fever >/= 101). Patient not taking: Reported on 11/18/2021 09/29/21   Eugenie Filler, MD  ciprofloxacin (CIPRO) 500 MG tablet Take 1 tablet (500 mg total) by mouth 2 (two) times daily. One po bid x 7 days Patient not taking: Reported on 11/18/2021 10/25/21   Veryl Speak, MD  Ensure Max Protein (ENSURE MAX PROTEIN) LIQD Take 330 mLs (11 oz total) by mouth 2 (two) times daily. Patient not taking: Reported on 11/18/2021 09/29/21   Eugenie Filler, MD  insulin aspart (NOVOLOG) 100 UNIT/ML FlexPen Inject 4 Units into the skin 3 (three) times daily with meals. Give only if patient consumes > 50% of meals. Patient not taking: Reported on 10/15/2021 09/29/21   Eugenie Filler, MD  insulin glargine-yfgn [facility]) 100 UNIT/ML injection Inject 0.1 mLs (10 Units total) into the skin daily. Patient not taking: Reported on 10/15/2021 09/30/21   Eugenie Filler, MD  Multiple Vitamin (MULTIVITAMIN WITH MINERALS) TABS tablet Take 1 tablet by mouth daily. Patient not taking: Reported on 11/18/2021 09/30/21   Eugenie Filler, MD  Nutritional Supplements (,FEEDING SUPPLEMENT, PROSOURCE PLUS) liquid Take 30 mLs by mouth 2 (two) times daily between meals. Patient not taking: Reported on 10/11/2021 09/30/21   Eugenie Filler, MD  ondansetron (ZOFRAN ODT) 8 MG disintegrating tablet Take 1 tablet (8 mg total) by mouth every 8 (eight) hours as needed for nausea or vomiting. Patient not taking: Reported on 11/18/2021 09/10/21   Kuppelweiser, Cassie L,  RPH-CPP  senna-docusate (SENOKOT-S) 8.6-50 MG tablet Take 1 tablet by mouth 2 (two) times daily. Patient not taking: Reported on 10/11/2021 09/29/21   Eugenie Filler, MD    Allergies    Apple and Penicillins  Review of Systems   Review of Systems  Constitutional:  Negative for chills and fever.  Respiratory:  Negative for shortness of breath.   Cardiovascular:  Negative for chest pain.  Gastrointestinal:  Positive for abdominal pain. Negative for anal bleeding, blood in stool, constipation, diarrhea, hematochezia, nausea and vomiting.  Genitourinary:  Positive for scrotal swelling and testicular pain (mild). Negative for dysuria, hematuria, penile discharge, penile pain and penile swelling.  Musculoskeletal:  Negative for back pain.  Skin:  Negative for color change, rash and wound.  All other systems reviewed and are negative.  Physical Exam Updated Vital Signs BP 140/76    Pulse 89    Temp 98.5 F (36.9 C) (Oral)    Resp 16    SpO2 98%   Physical Exam Vitals and nursing note reviewed. Exam conducted with a chaperone present.  Constitutional:      General: He is not in acute distress.    Appearance: He is not diaphoretic.  HENT:  Head: Normocephalic and atraumatic.     Mouth/Throat:     Pharynx: No oropharyngeal exudate.  Eyes:     General: No scleral icterus.    Conjunctiva/sclera: Conjunctivae normal.  Cardiovascular:     Rate and Rhythm: Normal rate and regular rhythm.     Pulses: Normal pulses.     Heart sounds: Normal heart sounds.  Pulmonary:     Effort: Pulmonary effort is normal. No respiratory distress.     Breath sounds: Normal breath sounds. No wheezing.  Abdominal:     General: Bowel sounds are normal.     Palpations: Abdomen is soft. There is no mass.     Tenderness: There is abdominal tenderness in the right lower quadrant and periumbilical area. There is no guarding or rebound. Positive signs include McBurney's sign.     Hernia: There is no hernia  in the left inguinal area or right inguinal area.     Comments: Right lower quadrant and periumbilical tenderness to palpation.  No overlying skin changes.  Positive McBurney sign.  Genitourinary:    Penis: Circumcised. Discharge present. No erythema or tenderness.      Testes:        Right: Tenderness and swelling present.        Left: Tenderness and swelling present.     Comments: Foley catheter in place.  Mucoid discharge noted from urethra.  Minimal tenderness to palpation to bilateral testicles.  Swelling to bilateral testes.  Erythema noted to bilateral testicles.  No inguinal canal adenopathy.  No inguinal hernia.  Musculoskeletal:        General: Normal range of motion.     Cervical back: Normal range of motion and neck supple.  Lymphadenopathy:     Lower Body: No right inguinal adenopathy. No left inguinal adenopathy.  Skin:    General: Skin is warm and dry.  Neurological:     Mental Status: He is alert.  Psychiatric:        Behavior: Behavior normal.    ED Results / Procedures / Treatments   Labs (all labs ordered are listed, but only abnormal results are displayed) Labs Reviewed  COMPREHENSIVE METABOLIC PANEL - Abnormal; Notable for the following components:      Result Value   Sodium 134 (*)    Glucose, Bld 117 (*)    Creatinine, Ser 1.43 (*)    Calcium 8.6 (*)    Albumin 3.0 (*)    GFR, Estimated 53 (*)    All other components within normal limits  CBC WITH DIFFERENTIAL/PLATELET  LIPASE, BLOOD  URINALYSIS, ROUTINE W REFLEX MICROSCOPIC    EKG None  Radiology CT ABDOMEN PELVIS W CONTRAST  Result Date: 11/22/2021 CLINICAL DATA:  Right lower quadrant abdominal pain. EXAM: CT ABDOMEN AND PELVIS WITH CONTRAST TECHNIQUE: Multidetector CT imaging of the abdomen and pelvis was performed using the standard protocol following bolus administration of intravenous contrast. CONTRAST:  51mL OMNIPAQUE IOHEXOL 350 MG/ML SOLN COMPARISON:  09/20/2021 FINDINGS: Lower chest:  Unremarkable Hepatobiliary: 6 mm hypodensity anterior hepatic dome is unchanged. Small area of low attenuation in the anterior liver, adjacent to the falciform ligament, is in a characteristic location for focal fatty deposition. Small focus of hypo attenuation in the subcapsular liver along the gallbladder fossa is stable, also likely focal fat Gallbladder is surgically absent. No intrahepatic or extrahepatic biliary dilation. Pancreas: No focal mass lesion. No dilatation of the main duct. No intraparenchymal cyst. No peripancreatic edema. Spleen: No splenomegaly. No focal mass lesion. Adrenals/Urinary  Tract: No adrenal nodule or mass. Bilateral hydroureteronephrosis with ureters dilated down to the level of the bladder. There is marked circumferential bladder wall thickening despite decompressed state with a urinary drainage catheter in situ. Edema/inflammation surrounds the urinary bladder. Stomach/Bowel: Stomach is nondistended. Duodenum is normally positioned as is the ligament of Treitz. No small bowel wall thickening. No small bowel dilatation. The terminal ileum is normal. The appendix is normal. No gross colonic mass. No colonic wall thickening. Diverticular changes are noted in the left colon without evidence of diverticulitis. Vascular/Lymphatic: There is mild atherosclerotic calcification of the abdominal aorta without aneurysm. There is no gastrohepatic or hepatoduodenal ligament lymphadenopathy. No retroperitoneal or mesenteric lymphadenopathy. No pelvic sidewall lymphadenopathy. Reproductive: The prostate gland and seminal vesicles are unremarkable. Other: No intraperitoneal free fluid. Musculoskeletal: No worrisome lytic or sclerotic osseous abnormality. IMPRESSION: 1. Marked circumferential bladder wall thickening may be infectious/inflammatory. Neoplasm not excluded. There is associated moderate bilateral hydroureteronephrosis. Infectious/inflammatory 2. Otherwise no acute findings in the abdomen  or pelvis. Specifically, no findings to explain the patient's history of right lower quadrant pain. 3. Aortic Atherosclerosis (ICD10-I70.0). Electronically Signed   By: Misty Stanley M.D.   On: 11/22/2021 14:58    Procedures Procedures   Medications Ordered in ED Medications  sodium chloride (PF) 0.9 % injection (has no administration in time range)  iohexol (OMNIPAQUE) 350 MG/ML injection 80 mL (80 mLs Intravenous Contrast Given 11/22/21 1437)    ED Course  I have reviewed the triage vital signs and the nursing notes.  Pertinent labs & imaging results that were available during my care of the patient were reviewed by me and considered in my medical decision making (see chart for details).    MDM Rules/Calculators/A&P                         Pt presents to the ED with right lower quadrant pain x 3 days.  Patient still has his appendix, he has had his gallbladder surgically removed.  He has not tried any medications for his symptoms.  Vital signs stable, patient afebrile.  On exam patient with mild right lower quadrant tenderness to palpation, positive McBurney sign.  No acute cardiovascular or respiratory exam findings. Differential diagnosis includes appendicitis, UTI, kidney stone.  No CVA tenderness on exam bilaterally.  CBC, CMP unremarkable.  Urinalysis ordered and pending at time of signout. CT abdomen pelvis notable for known bladder neoplasm, no acute appendicitis.    Patient also with testicular swelling x1.5 months.  Mild testicular tenderness to palpation on exam.  Erythema noted to scrotal region.  No inguinal lymphadenopathy.  No inguinal hernia.  Mucoid discharge noted from Foley catheter, no penile pain or swelling.  Differential diagnosis includes Fournier's, epididymitis, or cellulitis.  Patient presentation less likely indicative of Fournier's.  Less likely epididymitis, no dysuria, frequency, fever.  Patient presentation suspicious for cellulitis however will obtain scrotal  ultrasound to rule out abnormalities. Scrotal ultrasound ordered at signout with results pending.   Offered pain medication to patient, patient not currently in pain.  Informed patient he can notify his nurse if this should change.  Patient agreeable.  Case discussed with attending who agrees with labs and imaging work-up.  Patient having his bladder removed by urologist, Dr. Tresa Moore on 11/27/2021.  Patient case discussed with Ranell Patrick, PA-C at sign-out.  Plan at signout is scrotal ultrasound, pain management if pt requires. Depending on scrotal ultrasound findings, will treat appropriately whether with antibiotics  or urology consult.  Patient care transferred at sign out.  Final Clinical Impression(s) / ED Diagnoses Final diagnoses:  Right lower quadrant abdominal pain  Testicular swelling    Rx / DC Orders ED Discharge Orders     None        Maezie Justin A, PA-C 11/22/21 1524    Lacretia Leigh, MD 11/23/21 [id number]

## 2021-11-22 NOTE — ED Provider Notes (Signed)
Physical Exam  BP (!) 169/76    Pulse 93    Temp 98.5 F (36.9 C) (Oral)    Resp 16    SpO2 95%   Physical Exam Vitals and nursing note reviewed.  Constitutional:      General: He is not in acute distress.    Appearance: Normal appearance.  HENT:     Head: Normocephalic and atraumatic.  Eyes:     General:        Right eye: No discharge.        Left eye: No discharge.  Cardiovascular:     Comments: Regular rate and rhythm.  S1/S2 are distinct without any evidence of murmur, rubs, or gallops.  Radial pulses are 2+ bilaterally.  Dorsalis pedis pulses are 2+ bilaterally.  No evidence of pedal edema. Pulmonary:     Comments: Clear to auscultation bilaterally.  Normal effort.  No respiratory distress.  No evidence of wheezes, rales, or rhonchi heard throughout. Abdominal:     General: Abdomen is flat. Bowel sounds are normal. There is no distension.     Tenderness: There is no guarding or rebound.     Comments: Diffuse nonfocal abdominal tenderness.  Abdomen is soft.  No distention.  No obvious peritoneal signs.  Genitourinary:    Comments: Indwelling Foley catheter in place with surrounding purulent material.  Right testicle is enlarged and tender to palpation.  Bilateral testicles are having normal lie. Musculoskeletal:        General: Normal range of motion.     Cervical back: Neck supple.  Skin:    General: Skin is warm and dry.     Findings: No rash.  Neurological:     General: No focal deficit present.     Mental Status: He is alert.  Psychiatric:        Mood and Affect: Mood normal.        Behavior: Behavior normal.    ED Course/Procedures   Clinical Course as of 11/22/21 1815  Sun Nov 22, 2021  1659 Spoke with Dr. Milford Cage with urology who recommends shot of Rocephin in the emergency department with ciprofloxacin for discharge home.  I will discussed with the patient strict turn precautions.  He states that if things get worse to call the office and I will see him prior  to her surgery on Friday. [CF]    Clinical Course User Index [CF] Hendricks Limes, PA-C    Procedures  MDM  Accepted handoff at shift change from [facility]. Please see prior provider note for more detail.   Briefly: Patient is 70 y.o. male with history of bladder cancer with scheduled cystectomy on 11/27/2021 who presents to the emergency department with right lower quadrant abdominal pain over the last 3 to 4 days.  Patient states this feels like gas pain but is unable to pass any gas.  He denies any chest pain, shortness of breath, fever, chills, nausea, diarrhea, constipation.  Also complaining of scrotal pain and swelling.  DDX: concern for Fournier's gangrene, pyelonephritis, appendicitis, testicular torsion, epididymitis, worsening cancer pain, acute kidney failure.  Plan: CBC is without any evidence of leukocytosis.  CMP is without any significant abnormalities apart from chronic kidney disease.  Urinalysis does show infection.  This is in the setting of chronic indwelling Foley catheter.  This is likely colonization.  Lipase was negative.  CT abdomen showed inflammatory changes over the bladder with some bilateral hydronephrosis.  This is in the setting of bladder cancer.  Scrotal ultrasound showed complex hydrocele with questionable infection.  I spoke with urology who recommended dose of ceftriaxone in the emergency department and discharged with a fluoroquinolone.  Patient is feeling better and is comfortable going home.  Vital signs are stable.  I have a low suspicion at this point for years gangrene, pyelonephritis, appendicitis, torsion, acute on chronic kidney failure.  We will cover him as mentioned above with antibiotics for possible epididymitis/orchitis.  He is safe for discharge.        Myna Bright [place], PA-C 11/22/21 1816    Drenda Freeze, MD 11/22/21 [phone]

## 2021-11-22 NOTE — Discharge Instructions (Signed)
Please take ciprofloxacin 500 twice daily for the next 5 day. Please call your urology team if you experience worsening drainage from the catheter site, worsening pain, fevers, chills, or any other concerns you might have.  Please return to the emergency room sooner if you experience any of the symptoms above and cannot get in touch with urologist.

## 2021-11-23 LAB — URINE CULTURE

## 2021-11-26 ENCOUNTER — Encounter (HOSPITAL_COMMUNITY): Payer: Self-pay | Admitting: Urology

## 2021-11-26 ENCOUNTER — Other Ambulatory Visit: Payer: Self-pay

## 2021-11-26 ENCOUNTER — Inpatient Hospital Stay (HOSPITAL_COMMUNITY)
Admission: EM | Admit: 2021-11-26 | Discharge: 2021-12-04 | DRG: 654 | Disposition: A | Discharge status: Home Health | Payer: Medicare Other | Attending: Urology | Admitting: Urology

## 2021-11-26 DIAGNOSIS — Z794 Long term (current) use of insulin: Secondary | ICD-10-CM | POA: Diagnosis not present

## 2021-11-26 DIAGNOSIS — N1330 Unspecified hydronephrosis: Secondary | ICD-10-CM | POA: Diagnosis present

## 2021-11-26 DIAGNOSIS — N35812 Other urethral bulbous stricture, male: Principal | ICD-10-CM | POA: Diagnosis present

## 2021-11-26 DIAGNOSIS — Z79899 Other long term (current) drug therapy: Secondary | ICD-10-CM

## 2021-11-26 DIAGNOSIS — Z8551 Personal history of malignant neoplasm of bladder: Secondary | ICD-10-CM

## 2021-11-26 DIAGNOSIS — Z7984 Long term (current) use of oral hypoglycemic drugs: Secondary | ICD-10-CM

## 2021-11-26 DIAGNOSIS — R339 Retention of urine, unspecified: Secondary | ICD-10-CM | POA: Diagnosis present

## 2021-11-26 DIAGNOSIS — K567 Ileus, unspecified: Secondary | ICD-10-CM | POA: Diagnosis not present

## 2021-11-26 DIAGNOSIS — Z87891 Personal history of nicotine dependence: Secondary | ICD-10-CM | POA: Diagnosis not present

## 2021-11-26 DIAGNOSIS — E1122 Type 2 diabetes mellitus with diabetic chronic kidney disease: Secondary | ICD-10-CM | POA: Diagnosis present

## 2021-11-26 DIAGNOSIS — N189 Chronic kidney disease, unspecified: Secondary | ICD-10-CM | POA: Diagnosis not present

## 2021-11-26 DIAGNOSIS — N452 Orchitis: Secondary | ICD-10-CM | POA: Diagnosis present

## 2021-11-26 DIAGNOSIS — N179 Acute kidney failure, unspecified: Secondary | ICD-10-CM | POA: Diagnosis not present

## 2021-11-26 DIAGNOSIS — C679 Malignant neoplasm of bladder, unspecified: Secondary | ICD-10-CM | POA: Diagnosis present

## 2021-11-26 DIAGNOSIS — Z20822 Contact with and (suspected) exposure to covid-19: Secondary | ICD-10-CM | POA: Diagnosis present

## 2021-11-26 LAB — COMPREHENSIVE METABOLIC PANEL WITH GFR
ALT: 11 U/L (ref 0–44)
AST: 14 U/L — ABNORMAL LOW (ref 15–41)
Albumin: 2.9 g/dL — ABNORMAL LOW (ref 3.5–5.0)
Alkaline Phosphatase: 71 U/L (ref 38–126)
Anion gap: 8 (ref 5–15)
BUN: 17 mg/dL (ref 8–23)
CO2: 27 mmol/L (ref 22–32)
Calcium: 8.4 mg/dL — ABNORMAL LOW (ref 8.9–10.3)
Chloride: 101 mmol/L (ref 98–111)
Creatinine, Ser: 1.45 mg/dL — ABNORMAL HIGH (ref 0.61–1.24)
GFR, Estimated: 52 mL/min — ABNORMAL LOW
Glucose, Bld: 76 mg/dL (ref 70–99)
Potassium: 4.1 mmol/L (ref 3.5–5.1)
Sodium: 136 mmol/L (ref 135–145)
Total Bilirubin: 0.8 mg/dL (ref 0.3–1.2)
Total Protein: 7 g/dL (ref 6.5–8.1)

## 2021-11-26 LAB — CBC
HCT: 27.8 % — ABNORMAL LOW (ref 39.0–52.0)
Hemoglobin: 9.5 g/dL — ABNORMAL LOW (ref 13.0–17.0)
MCH: 29.4 pg (ref 26.0–34.0)
MCHC: 34.2 g/dL (ref 30.0–36.0)
MCV: 86.1 fL (ref 80.0–100.0)
Platelets: 305 K/uL (ref 150–400)
RBC: 3.23 MIL/uL — ABNORMAL LOW (ref 4.22–5.81)
RDW: 14.1 % (ref 11.5–15.5)
WBC: 9.5 K/uL (ref 4.0–10.5)
nRBC: 0 % (ref 0.0–0.2)

## 2021-11-26 LAB — GLUCOSE, CAPILLARY: Glucose-Capillary: 72 mg/dL (ref 70–99)

## 2021-11-26 LAB — ABO/RH: ABO/RH(D): O POS

## 2021-11-26 MED ORDER — INSULIN ASPART 100 UNIT/ML IJ SOLN: 0.0000 [IU] | Freq: Three times a day (TID) | INTRAMUSCULAR | Status: DC

## 2021-11-26 MED ORDER — METRONIDAZOLE 500 MG PO TABS
500.0000 mg | ORAL_TABLET | ORAL | Status: AC
  Administered 2021-11-26 (×2): 500 mg via ORAL
  Filled 2021-11-26 (×2): qty 1

## 2021-11-26 MED ORDER — NEOMYCIN SULFATE 500 MG PO TABS: 500.0000 mg | ORAL_TABLET | ORAL | Status: DC

## 2021-11-26 MED ORDER — INSULIN ASPART 100 UNIT/ML IJ SOLN: 4.0000 [IU] | Freq: Three times a day (TID) | INTRAMUSCULAR | Status: DC

## 2021-11-26 MED ORDER — PEG 3350-KCL-NA BICARB-NACL 420 G PO SOLR
4000.0000 mL | Freq: Once | ORAL | Status: AC
  Administered 2021-11-26: 21:00:00 4000 mL via ORAL

## 2021-11-26 MED ORDER — SODIUM CHLORIDE 0.9 % IV SOLN: INTRAVENOUS | Status: DC

## 2021-11-26 NOTE — H&P (Signed)
Adam Chung is an 70 y.o. male.    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 Bedside cysto 11/1 with several false passes including bladder neck 6 O-clock (true lumen 12 O clock). He has had numerous ER visits for retention / misplaced catheterd / catheter clogging. Single ureters bilaterally.   2 - Bladder Cancer - low grade cancer treated in 2015 by another facility and had BCG.   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 AFB negative. Placed on anti mycobacterium regime per ID. Bedside cysto 11/1 with minimal necrotic / obstructing tissue in prostatic fossa / bladder neck.   4 - Acute on Chronic Renal Failure / Chronic Hydronephrosis - Cr 2's x many 2022. Chronic hydro to thickened bladder x many imagign 2022. NO change in GFR with or without bladder drainage.   PMH sig for chole, IDDM2. He is retired [employer]. Has grandaughter doing pre-med. Son is very involved.   Today "Adam Chung" is seen prior to cystoprostatectomy for end stage bladder / lower urinary tract related to complications from bladder cancer treatment rendered at another facility. No interval fevers. Continues to struggle with catheter malfunctions. Most recent UCX non-clonal. Completed bowel prep to clear. C19 screen neative. Hgb 9.8.   Past Medical History:  Diagnosis Date   Cancer [facility])    bladder   Diabetes mellitus without complication [facility])     Past Surgical History:  Procedure Laterality Date   BIOPSY  09/24/2021   Procedure: BIOPSY;  Surgeon: Wilford Corner, MD;  Location: [facility] END[facility]COPY;  Service: Endoscopy;;   BLADDER SURGERY     CYSTOSCOPY N/A 08/09/2021   Procedure: CYSTOSCOPY WITH INSERTION OF FOLEY CATHETER;  Surgeon: Janith Lima, MD;  Location: [facility] ORS;  Service: Urology;  Laterality: N/A;    ESOPHAGOGASTRODUODENOSCOPY (EGD) WITH PROPOFOL N/A 09/24/2021   Procedure: ESOPHAGOGASTRODUODENOSCOPY (EGD) WITH PROPOFOL;  Surgeon: Wilford Corner, MD;  Location: [facility] ENDOSCOPY;  Service: Endoscopy;  Laterality: N/A;   fatty tumor excision     TRANSURETHRAL RESECTION OF PROSTATE N/A 08/09/2021   Procedure: TRANSURETHRAL RESECTION OF THE PROSTATE (TURP);  Surgeon: Janith Lima, MD;  Location: [facility] ORS;  Service: Urology;  Laterality: N/A;    Family History  Problem Relation Age of Onset   Heart disease Neg Hx    Social History:  reports that he has quit smoking. He has never used smokeless tobacco. He reports that he does not currently use alcohol. No history on file for drug use.  Allergies:  Allergies  Allergen Reactions   Apple Anaphylaxis   Penicillins Swelling    Swelling of lip per patient  Did it involve swelling of the face/tongue/throat, SOB, or low BP? Yes Did it involve sudden or severe rash/hives, skin peeling, or any reaction on the inside of your mouth or nose? No Did you need to seek medical attention at a hospital or doctor's office? Yes When did it last happen?    childhood   If all above answers are "NO", may proceed with cephalosporin use.      Medications Prior to Admission  Medication Sig Dispense Refill   glimepiride (AMARYL) 4 MG tablet Take 4 mg by mouth daily with breakfast.     insulin detemir (LEVEMIR FLEXTOUCH) 100 UNIT/ML FlexPen Inject 15 Units into the skin 2 (two) times daily.  acetaminophen (TYLENOL) 325 MG tablet Take 2 tablets (650 mg total) by mouth every 6 (six) hours as needed for mild pain (or Fever >/= 101). (Patient not taking: Reported on 11/18/2021)     ciprofloxacin (CIPRO) 500 MG tablet Take 1 tablet (500 mg total) by mouth every 12 (twelve) hours. 10 tablet 0   Ensure Max Protein (ENSURE MAX PROTEIN) LIQD Take 330 mLs (11 oz total) by mouth 2 (two) times daily. (Patient not taking: Reported on 11/18/2021)     insulin aspart  (NOVOLOG) 100 UNIT/ML FlexPen Inject 4 Units into the skin 3 (three) times daily with meals. Give only if patient consumes > 50% of meals. (Patient not taking: Reported on 10/15/2021) 15 mL 11   insulin glargine-yfgn (SEMGLEE) 100 UNIT/ML injection Inject 0.1 mLs (10 Units total) into the skin daily. (Patient not taking: Reported on 10/15/2021) 10 mL 0   Multiple Vitamin (MULTIVITAMIN WITH MINERALS) TABS tablet Take 1 tablet by mouth daily. (Patient not taking: Reported on 11/18/2021)     Nutritional Supplements (,FEEDING SUPPLEMENT, PROSOURCE PLUS) liquid Take 30 mLs by mouth 2 (two) times daily between meals. (Patient not taking: Reported on 10/11/2021)     ondansetron (ZOFRAN ODT) 8 MG disintegrating tablet Take 1 tablet (8 mg total) by mouth every 8 (eight) hours as needed for nausea or vomiting. (Patient not taking: Reported on 11/18/2021) 30 tablet 2   senna-docusate (SENOKOT-S) 8.6-50 MG tablet Take 1 tablet by mouth 2 (two) times daily. (Patient not taking: Reported on 10/11/2021)      No results found for this or any previous visit (from the past 48 hour(s)). No results found.  Review of Systems  Constitutional:  Negative for chills and fever.  Genitourinary:  Positive for dysuria, penile discharge and urgency.  All other systems reviewed and are negative.  Blood pressure (!) 170/80, pulse 91, temperature 98.3 F (36.8 C), temperature source Oral, resp. rate 16, height 5\' 11"  (1.803 m), weight 74.9 kg, SpO2 100 %. Physical Exam Vitals reviewed.  Constitutional:      Comments: Very pleasant, at baseline.   HENT:     Nose: Nose normal.     Mouth/Throat:     Mouth: Mucous membranes are moist.  Eyes:     Pupils: Pupils are equal, round, and reactive to light.  Cardiovascular:     Rate and Rhythm: Normal rate.  Pulmonary:     Effort: Pulmonary effort is normal.  Abdominal:     General: Abdomen is flat.  Genitourinary:    Comments: Foley in place with non-foul urine. NO CVAT at  present.  Musculoskeletal:        General: Normal range of motion.     Cervical back: Normal range of motion.  Skin:    General: Skin is warm.  Neurological:     General: No focal deficit present.     Mental Status: He is alert.  Psychiatric:        Mood and Affect: Mood normal.     Assessment/Plan  Proceed as planned with cysto / ICG, robotic cystoprostatectomy, bilateral pelvic node dissection, ileal conduit urinary diversion. He has been extensivley counseled by myself and several other providers on multiple occasions. Risks, benefits, alternatives, expected peri-op course discussed frankly previously and reiterated today.   Alexis Frock, MD 11/26/2021, 6:50 PM

## 2021-11-26 NOTE — Consult Note (Signed)
[place] Nurse requested for preoperative stoma site marking  Discussed surgical procedure and stoma creation with patient.  Explained role of the [place] nurse team.  Provided the patient with educational booklet and provided samples of pouching options.  We discussed bedside drainage options and that [place] team would begin education and demonstration on pouch changes MOnday.  Answered patient questions. Discussed outpatient ostomy clinic that would be available if needed after discharge.  Patient lives alone but will be recovering at the home of a friend.  This friend had a child with a fecal diversion and patient feels she will be a good resource for him also.    Examined patient lying, sitting, and standing in order to place the marking in the patient's visual field, away from any creases or abdominal contour issues and within the rectus muscle.  Patient has excess skin on abdomen and below umbilicus the skin is not easily visible to patient.  Patient has deep creasing at umbilicus.  Therefore, we opted for a site above his umbilicus.  He is able to easily visualize this area.     Marked for ileal conduit in the RLQ 5 cm to the right of the umbilicus and  4 cm above the umbilicus.   Patient's abdomen cleansed with CHG wipes at site markings, allowed to air dry prior to marking.Covered mark with thin film transparent dressing to preserve mark until date of surgery.   [place] Nurse team will follow up with patient after surgery for continue ostomy care and teaching.   Domenic Moras MSN, RN, FNP-BC CWON Wound, Ostomy, Continence Nurse Pager [phone]

## 2021-11-26 NOTE — Plan of Care (Signed)
  Problem: Coping: Goal: Level of anxiety will decrease Outcome: Progressing   Problem: Elimination: Goal: Will not experience complications related to bowel motility Outcome: Progressing   

## 2021-11-26 NOTE — Plan of Care (Signed)
  Problem: Education: Goal: Knowledge of General Education information will improve Description Including pain rating scale, medication(s)/side effects and non-pharmacologic comfort measures Outcome: Progressing   

## 2021-11-26 NOTE — Anesthesia Preprocedure Evaluation (Addendum)
Anesthesia Evaluation  Patient identified by MRN, date of birth, ID band Patient awake    Reviewed: Allergy & Precautions, NPO status , Patient's Chart, lab work & pertinent test results  Airway Mallampati: I       Dental no notable dental hx.    Pulmonary former smoker,    Pulmonary exam normal        Cardiovascular Normal cardiovascular exam     Neuro/Psych negative neurological ROS  negative psych ROS   GI/Hepatic   Endo/Other  diabetes, Well Controlled, Type 2, Oral Hypoglycemic Agents, Insulin Dependent  Renal/GU   negative genitourinary   Musculoskeletal negative musculoskeletal ROS (+)   Abdominal Normal abdominal exam  (+)   Peds  Hematology  (+) anemia ,   Anesthesia Other Findings   Reproductive/Obstetrics                            Anesthesia Physical Anesthesia Plan  ASA: 2  Anesthesia Plan: General   Post-op Pain Management: Ofirmev IV (intra-op)   Induction: Intravenous  PONV Risk Score and Plan:   Airway Management Planned: Oral ETT  Additional Equipment: None  Intra-op Plan:   Post-operative Plan: Extubation in OR  Informed Consent: I have reviewed the patients History and Physical, chart, labs and discussed the procedure including the risks, benefits and alternatives for the proposed anesthesia with the patient or authorized representative who has indicated his/her understanding and acceptance.     Dental advisory given  Plan Discussed with: CRNA  Anesthesia Plan Comments:        Anesthesia Quick Evaluation

## 2021-11-27 ENCOUNTER — Encounter (HOSPITAL_COMMUNITY): Payer: Self-pay | Admitting: Urology

## 2021-11-27 ENCOUNTER — Encounter (HOSPITAL_COMMUNITY)
Admission: EM | Disposition: A | Discharge status: Home Health | Payer: Self-pay | Source: Home / Self Care | Attending: Urology

## 2021-11-27 ENCOUNTER — Inpatient Hospital Stay (HOSPITAL_COMMUNITY): Payer: Medicare Other | Admitting: Anesthesiology

## 2021-11-27 DIAGNOSIS — C679 Malignant neoplasm of bladder, unspecified: Secondary | ICD-10-CM | POA: Diagnosis present

## 2021-11-27 HISTORY — PX: ROBOT ASSISTED LAPAROSCOPIC COMPLETE CYSTECT ILEAL CONDUIT: SHX5139

## 2021-11-27 HISTORY — PX: ROBOT ASSISTED LAPAROSCOPIC RADICAL PROSTATECTOMY: SHX5141

## 2021-11-27 HISTORY — PX: LYMPH NODE DISSECTION: SHX5087

## 2021-11-27 LAB — HEMOGLOBIN A1C
Hgb A1c MFr Bld: 7.9 % — ABNORMAL HIGH (ref 4.8–5.6)
Mean Plasma Glucose: 180.03 mg/dL

## 2021-11-27 LAB — SURGICAL PCR SCREEN
MRSA, PCR: NEGATIVE
Staphylococcus aureus: NEGATIVE

## 2021-11-27 LAB — SARS CORONAVIRUS 2 BY RT PCR (HOSPITAL ORDER, PERFORMED IN ~~LOC~~ HOSPITAL LAB): SARS Coronavirus 2: NEGATIVE

## 2021-11-27 LAB — GLUCOSE, CAPILLARY
Glucose-Capillary: 73 mg/dL (ref 70–99)
Glucose-Capillary: 166 mg/dL — ABNORMAL HIGH (ref 70–99)
Glucose-Capillary: 256 mg/dL — ABNORMAL HIGH (ref 70–99)
Glucose-Capillary: 250 mg/dL — ABNORMAL HIGH (ref 70–99)

## 2021-11-27 LAB — PREPARE RBC (CROSSMATCH)

## 2021-11-27 LAB — HEMOGLOBIN AND HEMATOCRIT, BLOOD
HCT: 28.4 % — ABNORMAL LOW (ref 39.0–52.0)
HCT: 26.3 % — ABNORMAL LOW (ref 39.0–52.0)
Hemoglobin: 9.3 g/dL — ABNORMAL LOW (ref 13.0–17.0)
Hemoglobin: 8.7 g/dL — ABNORMAL LOW (ref 13.0–17.0)

## 2021-11-27 SURGERY — CYSTECTOMY, ROBOT-ASSISTED, WITH ILEAL CONDUIT CREATION
Anesthesia: General | Site: Prostate

## 2021-11-27 MED ORDER — FENTANYL CITRATE (PF) 250 MCG/5ML IJ SOLN
INTRAMUSCULAR | Status: AC
  Filled 2021-11-27: qty 5

## 2021-11-27 MED ORDER — PROMETHAZINE HCL 25 MG/ML IJ SOLN: 6.2500 mg | INTRAMUSCULAR | Status: DC | PRN

## 2021-11-27 MED ORDER — HYDROMORPHONE HCL 1 MG/ML IJ SOLN
0.2500 mg | INTRAMUSCULAR | Status: DC | PRN
  Administered 2021-11-27 (×2): 0.5 mg via INTRAVENOUS

## 2021-11-27 MED ORDER — INDOCYANINE GREEN 25 MG IV SOLR
INTRAVENOUS | Status: AC
  Filled 2021-11-27: qty 10

## 2021-11-27 MED ORDER — ROCURONIUM BROMIDE 10 MG/ML (PF) SYRINGE
PREFILLED_SYRINGE | INTRAVENOUS | Status: DC | PRN
  Administered 2021-11-27: 30 mg via INTRAVENOUS
  Administered 2021-11-27: 20 mg via INTRAVENOUS
  Administered 2021-11-27: 70 mg via INTRAVENOUS
  Administered 2021-11-27: 20 mg via INTRAVENOUS

## 2021-11-27 MED ORDER — SODIUM CHLORIDE 0.9% IV SOLUTION: Freq: Once | INTRAVENOUS | Status: DC

## 2021-11-27 MED ORDER — SENNOSIDES-DOCUSATE SODIUM 8.6-50 MG PO TABS
2.0000 | ORAL_TABLET | Freq: Every day | ORAL | Status: DC
  Administered 2021-11-27 – 2021-12-03 (×6): 2 via ORAL
  Filled 2021-11-27 (×7): qty 2

## 2021-11-27 MED ORDER — BUPIVACAINE LIPOSOME 1.3 % IJ SUSP
INTRAMUSCULAR | Status: AC
  Filled 2021-11-27: qty 20

## 2021-11-27 MED ORDER — HYDROMORPHONE HCL 1 MG/ML IJ SOLN
INTRAMUSCULAR | Status: AC
  Filled 2021-11-27: qty 1

## 2021-11-27 MED ORDER — LIDOCAINE 2% (20 MG/ML) 5 ML SYRINGE
INTRAMUSCULAR | Status: DC | PRN
  Administered 2021-11-27: 100 mg via INTRAVENOUS

## 2021-11-27 MED ORDER — SUGAMMADEX SODIUM 200 MG/2ML IV SOLN
INTRAVENOUS | Status: DC | PRN
  Administered 2021-11-27: 200 mg via INTRAVENOUS

## 2021-11-27 MED ORDER — SODIUM CHLORIDE (PF) 0.9 % IJ SOLN
INTRAMUSCULAR | Status: AC
  Filled 2021-11-27: qty 20

## 2021-11-27 MED ORDER — CHLORHEXIDINE GLUCONATE CLOTH 2 % EX PADS: 6.0000 | MEDICATED_PAD | Freq: Every day | CUTANEOUS | Status: DC

## 2021-11-27 MED ORDER — LACTATED RINGERS IV SOLN: INTRAVENOUS | Status: DC

## 2021-11-27 MED ORDER — PROSOURCE PLUS PO LIQD
30.0000 mL | Freq: Two times a day (BID) | ORAL | Status: DC
  Administered 2021-11-27 – 2021-11-30 (×2): 30 mL via ORAL
  Filled 2021-11-27 (×5): qty 30

## 2021-11-27 MED ORDER — ADULT MULTIVITAMIN W/MINERALS CH
1.0000 | ORAL_TABLET | Freq: Every day | ORAL | Status: DC
  Administered 2021-12-04: 1 via ORAL
  Filled 2021-11-27 (×4): qty 1

## 2021-11-27 MED ORDER — ONDANSETRON HCL 4 MG/2ML IJ SOLN
INTRAMUSCULAR | Status: AC
  Filled 2021-11-27: qty 2

## 2021-11-27 MED ORDER — HYDROMORPHONE HCL 1 MG/ML IJ SOLN
0.5000 mg | INTRAMUSCULAR | Status: DC | PRN
  Administered 2021-11-27 – 2021-11-29 (×6): 1 mg via INTRAVENOUS
  Administered 2021-11-29: 0.5 mg via INTRAVENOUS
  Filled 2021-11-27 (×9): qty 1

## 2021-11-27 MED ORDER — BOOST / RESOURCE BREEZE PO LIQD CUSTOM
1.0000 | Freq: Two times a day (BID) | ORAL | Status: DC
  Administered 2021-12-01: 1 via ORAL

## 2021-11-27 MED ORDER — SODIUM CHLORIDE 0.9 % IV SOLN: INTRAVENOUS | Status: DC

## 2021-11-27 MED ORDER — PEG 3350-KCL-NA BICARB-NACL 420 G PO SOLR: 4000.0000 mL | Freq: Once | ORAL | Status: DC

## 2021-11-27 MED ORDER — MIDAZOLAM HCL 5 MG/5ML IJ SOLN
INTRAMUSCULAR | Status: DC | PRN
  Administered 2021-11-27: 2 mg via INTRAVENOUS

## 2021-11-27 MED ORDER — ACETAMINOPHEN 10 MG/ML IV SOLN
1000.0000 mg | Freq: Four times a day (QID) | INTRAVENOUS | Status: AC
  Administered 2021-11-27 – 2021-11-29 (×7): 1000 mg via INTRAVENOUS
  Filled 2021-11-27 (×7): qty 100

## 2021-11-27 MED ORDER — OXYCODONE HCL 5 MG PO TABS: 5.0000 mg | ORAL_TABLET | ORAL | Status: DC | PRN

## 2021-11-27 MED ORDER — STERILE WATER FOR IRRIGATION IR SOLN
Status: DC | PRN
  Administered 2021-11-27: 1000 mL

## 2021-11-27 MED ORDER — CLINDAMYCIN PHOSPHATE 900 MG/50ML IV SOLN
900.0000 mg | Freq: Once | INTRAVENOUS | Status: AC
  Administered 2021-11-27: 16:00:00 900 mg via INTRAVENOUS
  Filled 2021-11-27: qty 50

## 2021-11-27 MED ORDER — ONDANSETRON HCL 4 MG/2ML IJ SOLN
INTRAMUSCULAR | Status: DC | PRN
  Administered 2021-11-27: 4 mg via INTRAVENOUS

## 2021-11-27 MED ORDER — DEXAMETHASONE SODIUM PHOSPHATE 10 MG/ML IJ SOLN
INTRAMUSCULAR | Status: DC | PRN
  Administered 2021-11-27: 5 mg via INTRAVENOUS

## 2021-11-27 MED ORDER — ONDANSETRON HCL 4 MG/2ML IJ SOLN: 4.0000 mg | INTRAMUSCULAR | Status: DC | PRN

## 2021-11-27 MED ORDER — LACTATED RINGERS IR SOLN
Status: DC | PRN
  Administered 2021-11-27: 1

## 2021-11-27 MED ORDER — INSULIN ASPART 100 UNIT/ML IJ SOLN
0.0000 [IU] | Freq: Three times a day (TID) | INTRAMUSCULAR | Status: DC
  Administered 2021-11-27: 17:00:00 8 [IU] via SUBCUTANEOUS
  Administered 2021-11-28 (×2): 3 [IU] via SUBCUTANEOUS
  Administered 2021-11-28 – 2021-11-30 (×2): 2 [IU] via SUBCUTANEOUS
  Administered 2021-11-30: 3 [IU] via SUBCUTANEOUS
  Administered 2021-11-30: 2 [IU] via SUBCUTANEOUS
  Administered 2021-12-01: 8 [IU] via SUBCUTANEOUS
  Administered 2021-12-01 (×2): 2 [IU] via SUBCUTANEOUS
  Administered 2021-12-02: 3 [IU] via SUBCUTANEOUS
  Administered 2021-12-02: 5 [IU] via SUBCUTANEOUS
  Administered 2021-12-02 – 2021-12-04 (×4): 3 [IU] via SUBCUTANEOUS

## 2021-11-27 MED ORDER — CLINDAMYCIN PHOSPHATE 900 MG/50ML IV SOLN
900.0000 mg | INTRAVENOUS | Status: AC
  Administered 2021-11-27: 08:00:00 900 mg via INTRAVENOUS
  Filled 2021-11-27: qty 50

## 2021-11-27 MED ORDER — PHENYLEPHRINE 40 MCG/ML (10ML) SYRINGE FOR IV PUSH (FOR BLOOD PRESSURE SUPPORT)
PREFILLED_SYRINGE | INTRAVENOUS | Status: DC | PRN
  Administered 2021-11-27: 80 ug via INTRAVENOUS

## 2021-11-27 MED ORDER — PROPOFOL 10 MG/ML IV BOLUS
INTRAVENOUS | Status: AC
  Filled 2021-11-27: qty 20

## 2021-11-27 MED ORDER — STERILE WATER FOR IRRIGATION IR SOLN
Status: DC | PRN
  Administered 2021-11-27: 3000 mL via INTRAVESICAL

## 2021-11-27 MED ORDER — LACTATED RINGERS IV SOLN: INTRAVENOUS | Status: DC | PRN

## 2021-11-27 MED ORDER — STERILE WATER FOR INJECTION IJ SOLN
INTRAMUSCULAR | Status: DC | PRN
  Administered 2021-11-27: 08:00:00 10 mL via INTRAMUSCULAR

## 2021-11-27 MED ORDER — SODIUM CHLORIDE 0.9 % IV SOLN
INTRAVENOUS | Status: DC | PRN
  Administered 2021-11-27: 13:00:00 40 mL

## 2021-11-27 MED ORDER — FENTANYL CITRATE (PF) 100 MCG/2ML IJ SOLN
INTRAMUSCULAR | Status: DC | PRN
  Administered 2021-11-27: 50 ug via INTRAVENOUS
  Administered 2021-11-27 (×2): 100 ug via INTRAVENOUS
  Administered 2021-11-27 (×3): 50 ug via INTRAVENOUS

## 2021-11-27 MED ORDER — MIDAZOLAM HCL 2 MG/2ML IJ SOLN
INTRAMUSCULAR | Status: AC
  Filled 2021-11-27: qty 2

## 2021-11-27 MED ORDER — CIPROFLOXACIN IN D5W 400 MG/200ML IV SOLN
400.0000 mg | INTRAVENOUS | Status: AC
  Administered 2021-11-27: 08:00:00 400 mg via INTRAVENOUS
  Filled 2021-11-27: qty 200

## 2021-11-27 MED ORDER — DIPHENHYDRAMINE HCL 50 MG/ML IJ SOLN: 12.5000 mg | Freq: Four times a day (QID) | INTRAMUSCULAR | Status: DC | PRN

## 2021-11-27 MED ORDER — CIPROFLOXACIN IN D5W 400 MG/200ML IV SOLN
400.0000 mg | Freq: Once | INTRAVENOUS | Status: AC
  Administered 2021-11-27: 20:00:00 400 mg via INTRAVENOUS
  Filled 2021-11-27: qty 200

## 2021-11-27 MED ORDER — FENTANYL CITRATE (PF) 100 MCG/2ML IJ SOLN
INTRAMUSCULAR | Status: AC
  Filled 2021-11-27: qty 2

## 2021-11-27 MED ORDER — DIPHENHYDRAMINE HCL 12.5 MG/5ML PO ELIX: 12.5000 mg | ORAL_SOLUTION | Freq: Four times a day (QID) | ORAL | Status: DC | PRN

## 2021-11-27 MED ORDER — ROCURONIUM BROMIDE 10 MG/ML (PF) SYRINGE
PREFILLED_SYRINGE | INTRAVENOUS | Status: AC
  Filled 2021-11-27: qty 20

## 2021-11-27 MED ORDER — MEPERIDINE HCL 50 MG/ML IJ SOLN
6.2500 mg | INTRAMUSCULAR | Status: DC | PRN
Start: 2021-11-27 — End: 2021-11-27

## 2021-11-27 MED ORDER — INDOCYANINE GREEN 25 MG IV SOLR
INTRAVENOUS | Status: DC | PRN
  Administered 2021-11-27: 25 mg

## 2021-11-27 MED ORDER — HYDROMORPHONE HCL 1 MG/ML IJ SOLN
INTRAMUSCULAR | Status: AC
  Administered 2021-11-27: 14:00:00 0.5 mg via INTRAVENOUS
  Filled 2021-11-27: qty 1

## 2021-11-27 MED ORDER — PROPOFOL 10 MG/ML IV BOLUS
INTRAVENOUS | Status: DC | PRN
  Administered 2021-11-27: 150 mg via INTRAVENOUS

## 2021-11-27 SURGICAL SUPPLY — 127 items
APPLICATOR COTTON TIP 6 STRL (MISCELLANEOUS) ×3 IMPLANT
APPLICATOR COTTON TIP 6IN STRL (MISCELLANEOUS) ×4
APPLICATOR SURGIFLO ENDO (HEMOSTASIS) IMPLANT
BAG COUNTER SPONGE SURGICOUNT (BAG) ×2 IMPLANT
BAG LAPAROSCOPIC 12 15 PORT 16 (BASKET) ×3 IMPLANT
BAG RETRIEVAL 12/15 (BASKET) ×4
BENZOIN TINCTURE PRP APPL 2/3 (GAUZE/BANDAGES/DRESSINGS) ×1 IMPLANT
BLADE SURG SZ10 CARB STEEL (BLADE) ×1 IMPLANT
CATH FOLEY 2WAY SLVR 18FR 30CC (CATHETERS) ×4 IMPLANT
CATH SILICONE 5CC 18FR (INSTRUMENTS) ×4 IMPLANT
CATH TIEMANN FOLEY 18FR 5CC (CATHETERS) ×4 IMPLANT
CELLS DAT CNTRL 66122 CELL SVR (MISCELLANEOUS) ×3 IMPLANT
CHLORAPREP W/TINT 26 (MISCELLANEOUS) ×4 IMPLANT
CLIP LIGATING HEM O LOK PURPLE (MISCELLANEOUS) ×16 IMPLANT
CLIP LIGATING HEMO LOK XL GOLD (MISCELLANEOUS) ×8 IMPLANT
CLIP LIGATING HEMO O LOK GREEN (MISCELLANEOUS) ×5 IMPLANT
CNTNR URN SCR LID CUP LEK RST (MISCELLANEOUS) ×3 IMPLANT
CONT SPEC 4OZ STRL OR WHT (MISCELLANEOUS) ×1
COVER SURGICAL LIGHT HANDLE (MISCELLANEOUS) ×4 IMPLANT
COVER TIP SHEARS 8 DVNC (MISCELLANEOUS) ×3 IMPLANT
COVER TIP SHEARS 8MM DA VINCI (MISCELLANEOUS) ×1
CUTTER ECHEON FLEX ENDO 45 340 (ENDOMECHANICALS) ×3 IMPLANT
DECANTER SPIKE VIAL GLASS SM (MISCELLANEOUS) ×5 IMPLANT
DERMABOND ADVANCED (GAUZE/BANDAGES/DRESSINGS) ×1
DERMABOND ADVANCED .7 DNX12 (GAUZE/BANDAGES/DRESSINGS) ×6 IMPLANT
DRAIN CHANNEL RND F F (WOUND CARE) IMPLANT
DRAIN PENROSE 0.5X18 (DRAIN) IMPLANT
DRAPE ARM DVNC X/XI (DISPOSABLE) ×12 IMPLANT
DRAPE COLUMN DVNC XI (DISPOSABLE) ×3 IMPLANT
DRAPE DA VINCI XI ARM (DISPOSABLE) ×4
DRAPE DA VINCI XI COLUMN (DISPOSABLE) ×1
DRAPE SURG IRRIG POUCH 19X23 (DRAPES) ×4 IMPLANT
DRSG IV TEGADERM 3.5X4.5 STRL (GAUZE/BANDAGES/DRESSINGS) ×1 IMPLANT
DRSG TEGADERM 4X4.75 (GAUZE/BANDAGES/DRESSINGS) ×3 IMPLANT
ELECT PENCIL ROCKER SW 15FT (MISCELLANEOUS) ×4 IMPLANT
ELECT REM PT RETURN 15FT ADLT (MISCELLANEOUS) ×4 IMPLANT
GAUZE 4X4 16PLY ~~LOC~~+RFID DBL (SPONGE) ×1 IMPLANT
GLOVE SURG ENC MOIS LTX SZ6 (GLOVE) ×3 IMPLANT
GLOVE SURG ENC MOIS LTX SZ6.5 (GLOVE) ×8 IMPLANT
GLOVE SURG ENC TEXT LTX SZ7.5 (GLOVE) ×12 IMPLANT
GLOVE SURG UNDER POLY LF SZ6.5 (GLOVE) ×3 IMPLANT
GLOVE SURG UNDER POLY LF SZ7 (GLOVE) ×2 IMPLANT
GLOVE SURG UNDER POLY LF SZ7.5 (GLOVE) ×4 IMPLANT
GOWN STRL REUS W/TWL LRG LVL3 (GOWN DISPOSABLE) ×21 IMPLANT
GUIDEWIRE STR DUAL SENSOR (WIRE) ×1 IMPLANT
HOLDER FOLEY CATH W/STRAP (MISCELLANEOUS) ×4 IMPLANT
IRRIG SUCT STRYKERFLOW 2 WTIP (MISCELLANEOUS) ×4
IRRIGATION SUCT STRKRFLW 2 WTP (MISCELLANEOUS) ×3 IMPLANT
IV CATH 14GX2 1/4 (CATHETERS) ×1 IMPLANT
IV LACTATED RINGERS 1000ML (IV SOLUTION) ×4 IMPLANT
KIT PROCEDURE DA VINCI SI (MISCELLANEOUS) ×1
KIT PROCEDURE DVNC SI (MISCELLANEOUS) ×3 IMPLANT
KIT TURNOVER KIT A (KITS) IMPLANT
LEGGING LITHOTOMY PAIR STRL (DRAPES) ×1 IMPLANT
LOOP VESSEL MAXI BLUE (MISCELLANEOUS) ×4 IMPLANT
NDL ASPIRATION 22 (NEEDLE) ×3 IMPLANT
NDL INSUFFLATION 14GA 120MM (NEEDLE) ×3 IMPLANT
NDL SAFETY ECLIPSE 18X1.5 (NEEDLE) IMPLANT
NDL SPNL 22GX7 QUINCKE BK (NEEDLE) ×3 IMPLANT
NEEDLE ASPIRATION 22 (NEEDLE) ×4 IMPLANT
NEEDLE HYPO 18GX1.5 SHARP (NEEDLE) ×1
NEEDLE INSUFFLATION 14GA 120MM (NEEDLE) ×4 IMPLANT
NEEDLE SPNL 22GX7 QUINCKE BK (NEEDLE) ×4 IMPLANT
PACK ROBOT UROLOGY CUSTOM (CUSTOM PROCEDURE TRAY) ×4 IMPLANT
PAD POSITIONING PINK XL (MISCELLANEOUS) ×4 IMPLANT
PORT ACCESS TROCAR AIRSEAL 12 (TROCAR) ×3 IMPLANT
PORT ACCESS TROCAR AIRSEAL 5M (TROCAR) ×2
POUCH UROSTOMY 11/2 CONVEX (OSTOMY) ×1 IMPLANT
RELOAD STAPLE 45 4.1 GRN THCK (STAPLE) ×3 IMPLANT
RELOAD STAPLE 60 2.6 WHT THN (STAPLE) ×9 IMPLANT
RELOAD STAPLE 60 4.1 GRN THCK (STAPLE) ×9 IMPLANT
RELOAD STAPLER GREEN 60MM (STAPLE) ×15 IMPLANT
RELOAD STAPLER WHITE 60MM (STAPLE) ×21 IMPLANT
RETRACTOR LONRSTAR 16.6X16.6CM (MISCELLANEOUS) IMPLANT
RETRACTOR STAY HOOK 5MM (MISCELLANEOUS) IMPLANT
RETRACTOR STER APS 16.6X16.6CM (MISCELLANEOUS)
RETRACTOR WND ALEXIS 18 MED (MISCELLANEOUS) ×3 IMPLANT
RTRCTR WOUND ALEXIS 18CM MED (MISCELLANEOUS) ×4
SEAL CANN UNIV 5-8 DVNC XI (MISCELLANEOUS) ×12 IMPLANT
SEAL XI 5MM-8MM UNIVERSAL (MISCELLANEOUS) ×4
SET TRI-LUMEN FLTR TB AIRSEAL (TUBING) ×4 IMPLANT
SOL ANTI FOG 6CC (MISCELLANEOUS) IMPLANT
SOLUTION ANTI FOG 6CC (MISCELLANEOUS) ×1
SOLUTION ELECTROLUBE (MISCELLANEOUS) ×4 IMPLANT
SPONGE GAUZE 2X2 8PLY STRL LF (GAUZE/BANDAGES/DRESSINGS) ×1 IMPLANT
SPONGE T-LAP 18X18 ~~LOC~~+RFID (SPONGE) ×7 IMPLANT
SPONGE T-LAP 4X18 ~~LOC~~+RFID (SPONGE) ×4 IMPLANT
STAPLE RELOAD 45 GRN (STAPLE) ×3 IMPLANT
STAPLE RELOAD 45MM GREEN (STAPLE) ×1
STAPLER ECHELON LONG 60 440 (INSTRUMENTS) ×4 IMPLANT
STAPLER RELOAD GREEN 60MM (STAPLE) ×20
STAPLER RELOAD WHITE 60MM (STAPLE) ×28
STENT SET URETHERAL LEFT 7FR (STENTS) ×4 IMPLANT
STENT SET URETHERAL RIGHT 7FR (STENTS) ×4 IMPLANT
SURGIFLO W/THROMBIN 8M KIT (HEMOSTASIS) IMPLANT
SUT CHROMIC 4 0 RB 1X27 (SUTURE) ×4 IMPLANT
SUT ETHILON 3 0 PS 1 (SUTURE) ×4 IMPLANT
SUT MNCRL AB 4-0 PS2 18 (SUTURE) ×8 IMPLANT
SUT PDS AB 0 CTX 36 PDP370T (SUTURE) ×12 IMPLANT
SUT PDS AB 1 CT1 27 (SUTURE) ×8 IMPLANT
SUT SILK 3 0 SH 30 (SUTURE) IMPLANT
SUT SILK 3 0 SH CR/8 (SUTURE) ×4 IMPLANT
SUT V-LOC BARB 180 2/0GR6 GS22 (SUTURE)
SUT VIC AB 2-0 CT1 27 (SUTURE)
SUT VIC AB 2-0 CT1 27XBRD (SUTURE) IMPLANT
SUT VIC AB 2-0 SH 18 (SUTURE) IMPLANT
SUT VIC AB 2-0 SH 27 (SUTURE) ×1
SUT VIC AB 2-0 SH 27X BRD (SUTURE) ×3 IMPLANT
SUT VIC AB 2-0 UR5 27 (SUTURE) ×16 IMPLANT
SUT VIC AB 3-0 SH 27 (SUTURE) ×7
SUT VIC AB 3-0 SH 27X BRD (SUTURE) ×6 IMPLANT
SUT VIC AB 3-0 SH 27XBRD (SUTURE) ×12 IMPLANT
SUT VIC AB 4-0 RB1 27 (SUTURE) ×4
SUT VIC AB 4-0 RB1 27XBRD (SUTURE) ×12 IMPLANT
SUT VICRYL 0 UR6 27IN ABS (SUTURE) ×4 IMPLANT
SUT VLOC BARB 180 ABS3/0GR12 (SUTURE) ×12
SUTURE V-LC BRB 180 2/0GR6GS22 (SUTURE) IMPLANT
SUTURE VLOC BRB 180 ABS3/0GR12 (SUTURE) ×9 IMPLANT
SYR 27GX1/2 1ML LL SAFETY (SYRINGE) ×3 IMPLANT
SYR CONTROL 10ML LL (SYRINGE) ×1 IMPLANT
SYSTEM UROSTOMY GENTLE TOUCH (WOUND CARE) ×4 IMPLANT
TOWEL OR NON WOVEN STRL DISP B (DISPOSABLE) ×4 IMPLANT
TROCAR BLADELESS 15MM (ENDOMECHANICALS) ×4 IMPLANT
TROCAR XCEL NON-BLD 5MMX100MML (ENDOMECHANICALS) IMPLANT
TUBING CONNECTING 10 (TUBING) ×5 IMPLANT
WATER STERILE IRR 1000ML POUR (IV SOLUTION) ×4 IMPLANT
WATER STERILE IRR 3000ML UROMA (IV SOLUTION) ×1 IMPLANT

## 2021-11-27 NOTE — Anesthesia Postprocedure Evaluation (Signed)
Anesthesia Post Note  Patient: Adam Chung  Procedure(s) Performed: XI ROBOTIC ASSISTED LAPAROSCOPIC COMPLETE CYSTECT ILEAL CONDUIT AND INDOCYANINE GREEN DYE INJECTION (Bladder) XI ROBOTIC ASSISTED LAPAROSCOPIC RADICAL PROSTATECTOMY (Prostate) PELVIC LYMPH NODE DISSECTION (Bilateral: Pelvis)     Patient location during evaluation: PACU Anesthesia Type: General Level of consciousness: sedated Pain management: pain level controlled Vital Signs Assessment: post-procedure vital signs reviewed and stable Respiratory status: spontaneous breathing Cardiovascular status: stable Postop Assessment: no apparent nausea or vomiting Anesthetic complications: no   No notable events documented.  Last Vitals:  Vitals:   11/27/21 1430 11/27/21 1505  BP: (!) 142/68 (!) 154/80  Pulse: 94 96  Resp: 12 12  Temp:  36.6 C  SpO2: 100% 100%    Last Pain:  Vitals:   11/27/21 1505  TempSrc: Oral  PainSc:                  Huston Foley

## 2021-11-27 NOTE — Progress Notes (Signed)
Dr. Diona Fanti paged regarding patient's JP output.  Stat H&H placed.  Night RN to follow-up with on-call urology MD with results.

## 2021-11-27 NOTE — Progress Notes (Signed)
Blister noted to right thigh area near foley attachment site. Pt doesn't know where this came from. No irration noted to foley attachment site.

## 2021-11-27 NOTE — Anesthesia Procedure Notes (Signed)
Procedure Name: Intubation Date/Time: 11/27/2021 7:54 AM Performed by: Gean Maidens, CRNA Pre-anesthesia Checklist: Patient identified, Emergency Drugs available, Suction available, Patient being monitored and Timeout performed Patient Re-evaluated:Patient Re-evaluated prior to induction Oxygen Delivery Method: Circle system utilized Preoxygenation: Pre-oxygenation with 100% oxygen Induction Type: IV induction Ventilation: Mask ventilation without difficulty Laryngoscope Size: Mac and 4 Grade View: Grade I Tube type: Oral Tube size: 7.5 mm Number of attempts: 1 Airway Equipment and Method: Stylet Placement Confirmation: ETT inserted through vocal cords under direct vision Secured at: 23 cm Tube secured with: Tape Dental Injury: Teeth and Oropharynx as per pre-operative assessment

## 2021-11-27 NOTE — Transfer of Care (Signed)
Immediate Anesthesia Transfer of Care Note  Patient: LODEN LAURENT  Procedure(s) Performed: XI ROBOTIC ASSISTED LAPAROSCOPIC COMPLETE CYSTECT ILEAL CONDUIT AND INDOCYANINE GREEN DYE INJECTION (Bladder) XI ROBOTIC ASSISTED LAPAROSCOPIC RADICAL PROSTATECTOMY (Prostate) PELVIC LYMPH NODE DISSECTION (Bilateral: Pelvis)  Patient Location: PACU  Anesthesia Type:General  Level of Consciousness: sedated, patient cooperative and responds to stimulation  Airway & Oxygen Therapy: Patient Spontanous Breathing and Patient connected to face mask oxygen  Post-op Assessment: Report given to RN and Post -op Vital signs reviewed and stable  Post vital signs: Reviewed and stable  Last Vitals:  Vitals Value Taken Time  BP    Temp    Pulse    Resp 15 11/27/21 1322  SpO2    Vitals shown include unvalidated device data.  Last Pain:  Vitals:   11/27/21 0423  TempSrc: Oral  PainSc:       Patients Stated Pain Goal: 0 (90/38/33 3832)  Complications: No notable events documented.

## 2021-11-27 NOTE — Discharge Instructions (Signed)

## 2021-11-27 NOTE — Brief Op Note (Signed)
11/27/2021  1:15 PM  PATIENT:  [name]  70 y.o. male  PRE-OPERATIVE DIAGNOSIS:  BLADDER CANCER  POST-OPERATIVE DIAGNOSIS:  BLADDER CANCER  PROCEDURE:  Procedure(s): XI ROBOTIC ASSISTED LAPAROSCOPIC COMPLETE CYSTECT ILEAL CONDUIT AND INDOCYANINE GREEN DYE INJECTION (N/A) XI ROBOTIC ASSISTED LAPAROSCOPIC RADICAL PROSTATECTOMY (N/A) PELVIC LYMPH NODE DISSECTION (Bilateral)  SURGEON:  Surgeon(s) and Role:    Alexis Frock, MD - Primary  PHYSICIAN ASSISTANT:   ASSISTANTS: Clemetine Marker PA   ANESTHESIA:   local and general  EBL:  200 mL   BLOOD ADMINISTERED:none  DRAINS:  1 - RLQ Urostomy to gravity with Red (right) and Left (blue) bander stents; LLQ JP to bulb    LOCAL MEDICATIONS USED:  MARCAINE     SPECIMEN:  Source of Specimen:  1 - ureteral margins; 2 - pelvic lymph nodes; 3 - bladder + prostate  DISPOSITION OF SPECIMEN:  PATHOLOGY  COUNTS:  YES  TOURNIQUET:  * No tourniquets in log *  DICTATION: .Other Dictation: Dictation Number   [id number]  PLAN OF CARE: Admit to inpatient   PATIENT DISPOSITION:  PACU - hemodynamically stable.   Delay start of Pharmacological VTE agent (>24hrs) due to surgical blood loss or risk of bleeding: yes

## 2021-11-27 NOTE — Progress Notes (Signed)
Initial Nutrition Assessment  DOCUMENTATION CODES:   Not applicable  INTERVENTION:  - diet advancement as medically feasible. - will order Boost Breeze BID, each supplement provides 250 kcal and 9 grams of protein. - will order 30 ml Prosource Plus BID, each supplement provides 100 kcal and 15 grams protein.  - will order 1 tablet multivitamin with minerals/day.  - complete NFPE when feasible.    NUTRITION DIAGNOSIS:   Increased nutrient needs related to acute illness, post-op healing as evidenced by estimated needs.  GOAL:   Patient will meet greater than or equal to 90% of their needs  MONITOR:   Diet advancement, PO intake, Supplement acceptance, Labs, Weight trends, Skin  REASON FOR ASSESSMENT:   Malnutrition Screening Tool  ASSESSMENT:   70 year-old male with medical history of DM and bladder cancer. Patient admitted for cystectomy and ileal conduit.  Diet advanced to CLD yesterday at [place] and changed to NPO at midnight. No documentation of dinner meal intake yesterday.   Patient has been out of his room since 0646 today. Continues to be out of the room at this time.   Patient is known to this RD from previous hospitalization and was seen on 10/07/21 at which time he met criteria for moderate malnutrition related to chronic illness, cancer and cancer related treatments as evidenced by moderate fat depletion, moderate muscle depletion, severe muscle depletion.  Weight yesterday was 165 lb and weight on 10/24/21 was 157 lb. This indicates 8 lb weight gain in the past month.   Labs reviewed; CBGs: 73 and 166 mg/dl, creatinine: 1.45 mg/dl, GFR: 52 ml/min.  Medications reviewed; sliding scale novolog, 4 units novolog TID.  IVF; NS @ 50 ml/hr.    NUTRITION - FOCUSED PHYSICAL EXAM:  Patient in OR.  Diet Order:   Diet Order             Diet NPO time specified  Diet effective midnight           Diet NPO time specified  Diet effective midnight                    EDUCATION NEEDS:   Not appropriate for education at this time  Skin:  Skin Assessment: Skin Integrity Issues: Skin Integrity Issues:: Incisions Incisions: abdomen and penis (12/30)  Last BM:  PTA/unknown  Height:   Ht Readings from Last 1 Encounters:  11/26/21 _0  (1.803 m)    Weight:   Wt Readings from Last 1 Encounters:  11/26/21 74.9 kg    Estimated Nutritional Needs:  Kcal:  2200-2400 kcal Protein:  120-130 grams Fluid:  >/= 2.3 L/day      Jarome Matin, MS, RD, LDN, CNSC Inpatient Clinical Dietitian RD pager # available in [place]  After hours/weekend pager # available in [facility]

## 2021-11-28 ENCOUNTER — Encounter (HOSPITAL_COMMUNITY): Payer: Self-pay | Admitting: Urology

## 2021-11-28 LAB — GLUCOSE, CAPILLARY
Glucose-Capillary: 200 mg/dL — ABNORMAL HIGH (ref 70–99)
Glucose-Capillary: 182 mg/dL — ABNORMAL HIGH (ref 70–99)
Glucose-Capillary: 144 mg/dL — ABNORMAL HIGH (ref 70–99)
Glucose-Capillary: 137 mg/dL — ABNORMAL HIGH (ref 70–99)

## 2021-11-28 LAB — BASIC METABOLIC PANEL
Anion gap: 8 (ref 5–15)
BUN: 29 mg/dL — ABNORMAL HIGH (ref 8–23)
CO2: 26 mmol/L (ref 22–32)
Calcium: 7.9 mg/dL — ABNORMAL LOW (ref 8.9–10.3)
Chloride: 102 mmol/L (ref 98–111)
Creatinine, Ser: 1.62 mg/dL — ABNORMAL HIGH (ref 0.61–1.24)
GFR, Estimated: 45 mL/min — ABNORMAL LOW (ref >60)
Glucose, Bld: 241 mg/dL — ABNORMAL HIGH (ref 70–99)
Potassium: 5 mmol/L (ref 3.5–5.1)
Sodium: 136 mmol/L (ref 135–145)

## 2021-11-28 LAB — HEMOGLOBIN AND HEMATOCRIT, BLOOD
HCT: 25.8 % — ABNORMAL LOW (ref 39.0–52.0)
HCT: 26.4 % — ABNORMAL LOW (ref 39.0–52.0)
Hemoglobin: 8.6 g/dL — ABNORMAL LOW (ref 13.0–17.0)
Hemoglobin: 8.5 g/dL — ABNORMAL LOW (ref 13.0–17.0)

## 2021-11-28 NOTE — Progress Notes (Signed)
1 Day Post-Op Subjective: Patient reports feeling OK--pain 3/10. Hiccups.  Objective: Vital signs in last 24 hours: Temp:  [97.4 F (36.3 C)-97.8 F (36.6 C)] 97.4 F (36.3 C) (12/31 0431) Pulse Rate:  [80-96] 80 (12/31 0431) Resp:  [12-18] 16 (12/31 0431) BP: (105-155)/(62-129) 128/64 (12/31 0431) SpO2:  [95 %-100 %] 99 % (12/31 0431)  Intake/Output from previous day: 12/30 0701 - 12/31 0700 In: 4443.1 [I.V.:4143.1; IV Piggyback:300] Out: 1560 [Urine:675; Drains:685; Blood:200] Intake/Output this shift: No intake/output data recorded.  Physical Exam:  Constitutional: Vital signs reviewed. WD WN in NAD   Eyes: PERRL, No scleral icterus.   Cardiovascular: RRR Pulmonary/Chest: Normal effort Abdominal: Soft. Non-distended. Stoma pink. Wounds C/D/I. Extremities: No cyanosis or edema   Lab Results: Recent Labs    11/27/21 1951 11/27/21 2321 11/28/21 0540  HGB 8.7* 8.6* 8.5*  HCT 26.3* 25.8* 26.4*   BMET Recent Labs    11/26/21 1903 11/28/21 0540  NA 136 136  K 4.1 5.0  CL 101 102  CO2 27 26  GLUCOSE 76 241*  BUN 17 29*  CREATININE 1.45* 1.62*  CALCIUM 8.4* 7.9*   No results for input(s): LABPT, INR in the last 72 hours. No results for input(s): LABURIN in the last 72 hours. Results for orders placed or performed during the hospital encounter of 11/26/21  Surgical pcr screen     Status: None   Collection Time: 11/27/21  4:16 AM   Specimen: Nasal Mucosa; Nasal Swab  Result Value Ref Range Status   MRSA, PCR NEGATIVE NEGATIVE Final   Staphylococcus aureus NEGATIVE NEGATIVE Final    Comment: (NOTE) The Xpert SA Assay (FDA approved for NASAL specimens in patients 54 years of age and older), is one component of a comprehensive surveillance program. It is not intended to diagnose infection nor to guide or monitor treatment. Performed at [facility], [place] [address], [place], [place] [address]   SARS Coronavirus 2 by RT PCR (hospital order,  performed in [facility] hospital lab) Nasopharyngeal Nasopharyngeal Swab     Status: None   Collection Time: 11/27/21  5:18 AM   Specimen: Nasopharyngeal Swab  Result Value Ref Range Status   SARS Coronavirus 2 NEGATIVE NEGATIVE Final    Comment: Performed at [facility], [place] [address], [place], [place] [address]    Studies/Results: No results found.  Assessment/Plan:  POD 1 cystectomy/conduit. Initially had copious blake drainage, now less. Hct stable but significantly lower than preop. Will get OOB, advance diet as tolerated.   LOS: 2 days   Adam Chung 11/28/2021, 7:52 AM

## 2021-11-29 LAB — GLUCOSE, CAPILLARY
Glucose-Capillary: 120 mg/dL — ABNORMAL HIGH (ref 70–99)
Glucose-Capillary: 120 mg/dL — ABNORMAL HIGH (ref 70–99)
Glucose-Capillary: 101 mg/dL — ABNORMAL HIGH (ref 70–99)
Glucose-Capillary: 148 mg/dL — ABNORMAL HIGH (ref 70–99)

## 2021-11-29 LAB — HEMOGLOBIN AND HEMATOCRIT, BLOOD
HCT: 27.6 % — ABNORMAL LOW (ref 39.0–52.0)
Hemoglobin: 9 g/dL — ABNORMAL LOW (ref 13.0–17.0)

## 2021-11-29 LAB — BASIC METABOLIC PANEL WITH GFR
Anion gap: 7 (ref 5–15)
BUN: 25 mg/dL — ABNORMAL HIGH (ref 8–23)
CO2: 23 mmol/L (ref 22–32)
Calcium: 8 mg/dL — ABNORMAL LOW (ref 8.9–10.3)
Chloride: 108 mmol/L (ref 98–111)
Creatinine, Ser: 1.4 mg/dL — ABNORMAL HIGH (ref 0.61–1.24)
GFR, Estimated: 54 mL/min — ABNORMAL LOW (ref >60)
Glucose, Bld: 117 mg/dL — ABNORMAL HIGH (ref 70–99)
Potassium: 3.6 mmol/L (ref 3.5–5.1)
Sodium: 138 mmol/L (ref 135–145)

## 2021-11-29 MED ORDER — ACETAMINOPHEN 160 MG/5ML PO SOLN
650.0000 mg | Freq: Four times a day (QID) | ORAL | Status: DC | PRN
  Administered 2021-11-29: 650 mg via ORAL
  Filled 2021-11-29: qty 20.3

## 2021-11-29 MED ORDER — ENOXAPARIN SODIUM 40 MG/0.4ML IJ SOSY
40.0000 mg | PREFILLED_SYRINGE | INTRAMUSCULAR | Status: DC
  Administered 2021-11-29 – 2021-12-03 (×5): 40 mg via SUBCUTANEOUS
  Filled 2021-11-29 (×5): qty 0.4

## 2021-11-29 MED ORDER — ACETAMINOPHEN 325 MG PO TABS
650.0000 mg | ORAL_TABLET | Freq: Four times a day (QID) | ORAL | Status: DC | PRN
  Filled 2021-11-29: qty 2

## 2021-11-29 NOTE — Plan of Care (Signed)
  Problem: Activity: Goal: Risk for activity intolerance will decrease Outcome: Progressing   

## 2021-11-29 NOTE — Progress Notes (Signed)
Urology Inpatient Progress Report  Bladder cancer ([place]) [C67.9]  Procedure(s): XI ROBOTIC ASSISTED LAPAROSCOPIC COMPLETE CYSTECT ILEAL CONDUIT AND INDOCYANINE GREEN DYE INJECTION XI ROBOTIC ASSISTED LAPAROSCOPIC RADICAL PROSTATECTOMY PELVIC LYMPH NODE DISSECTION  2 Days Post-Op   Intv/Subj: No acute events overnight. Patient is without complaint. Hemoglobin stable, creatinine stable.  380 out of the JP last shift.  Good urine output.   Principal Problem:   Bladder cancer ([place])  Current Facility-Administered Medications  Medication Dose Route Frequency Provider Last Rate Last Admin   (feeding supplement) PROSource Plus liquid 30 mL  30 mL Oral BID BM Alexis Frock, MD   30 mL at 11/27/21 2021   0.9 %  sodium chloride infusion (Manually program via Guardrails IV Fluids)   Intravenous Once Alexis Frock, MD       0.9 %  sodium chloride infusion   Intravenous Continuous Marton Redwood III, MD 100 mL/hr at 11/29/21 0445 New Bag at 11/29/21 0445   diphenhydrAMINE (BENADRYL) injection 12.5 mg  12.5 mg Intravenous Q6H PRN Debbrah Alar, PA-C       Or   diphenhydrAMINE (BENADRYL) 12.5 MG/5ML elixir 12.5 mg  12.5 mg Oral Q6H PRN Dancy, Amanda, PA-C       enoxaparin (LOVENOX) injection 40 mg  40 mg Subcutaneous Q24H Marton Redwood III, MD       feeding supplement (BOOST / RESOURCE BREEZE) liquid 1 Container  1 Container Oral BID BM Alexis Frock, MD       HYDROmorphone (DILAUDID) injection 0.5-1 mg  0.5-1 mg Intravenous Q2H PRN Debbrah Alar, PA-C   1 mg at 11/29/21 0752   insulin aspart (novoLOG) injection 0-15 Units  0-15 Units Subcutaneous TID WC Debbrah Alar, PA-C   2 Units at 11/28/21 1718   multivitamin with minerals tablet 1 tablet  1 tablet Oral Daily Alexis Frock, MD       ondansetron [facility]) injection 4 mg  4 mg Intravenous Q4H PRN Debbrah Alar, PA-C       oxyCODONE (Oxy IR/ROXICODONE) immediate release tablet 5 mg  5 mg Oral Q4H PRN Debbrah Alar, PA-C        senna-docusate (Senokot-S) tablet 2 tablet  2 tablet Oral QHS Debbrah Alar, PA-C   2 tablet at 11/27/21 2137     Objective: Vital: Vitals:   11/28/21 0431 11/28/21 1412 11/28/21 2243 11/29/21 0312  BP: 128/64 130/63 (!) 146/79 129/73  Pulse: 80 83 88 91  Resp: 16 18 19 18   Temp: (!) 97.4 F (36.3 C) 98 F (36.7 C) 98.1 F (36.7 C) 98.2 F (36.8 C)  TempSrc: Oral Oral Oral Oral  SpO2: 99% 97% 95% 95%  Weight:      Height:       I/Os: I/O last 3 completed shifts: In: 2647.3 [P.O.:200; I.V.:2047.3; IV Piggyback:400] Out: 8032 [Urine:1950; Drains:1080]  Physical Exam:  General: Patient is in no apparent distress Lungs: Normal respiratory effort, chest expands symmetrically. GI: Incisions are c/d/i. The abdomen is soft and nontender without mass.  Ileal conduit pink, patent, productive of clear yellow urine, stents visible JP drain with serosanguinous drainage Ext: lower extremities symmetric  Lab Results: Recent Labs    11/26/21 1903 11/27/21 1731 11/27/21 2321 11/28/21 0540 11/29/21 0531  WBC 9.5  --   --   --   --   HGB 9.5*   < > 8.6* 8.5* 9.0*  HCT 27.8*   < > 25.8* 26.4* 27.6*   < > = values in this interval not displayed.  Recent Labs    11/26/21 1903 11/28/21 0540 11/29/21 0531  NA 136 136 138  K 4.1 5.0 3.6  CL 101 102 108  CO2 27 26 23   GLUCOSE 76 241* 117*  BUN 17 29* 25*  CREATININE 1.45* 1.62* 1.40*  CALCIUM 8.4* 7.9* 8.0*   No results for input(s): LABPT, INR in the last 72 hours. No results for input(s): LABURIN in the last 72 hours. Results for orders placed or performed during the hospital encounter of 11/26/21  Surgical pcr screen     Status: None   Collection Time: 11/27/21  4:16 AM   Specimen: Nasal Mucosa; Nasal Swab  Result Value Ref Range Status   MRSA, PCR NEGATIVE NEGATIVE Final   Staphylococcus aureus NEGATIVE NEGATIVE Final    Comment: (NOTE) The Xpert SA Assay (FDA approved for NASAL specimens in patients 75 years of age  and older), is one component of a comprehensive surveillance program. It is not intended to diagnose infection nor to guide or monitor treatment. Performed at [facility], [place] [address] [place], [place] [address]   SARS Coronavirus 2 by RT PCR (hospital order, performed in [facility] hospital lab) Nasopharyngeal Nasopharyngeal Swab     Status: None   Collection Time: 11/27/21  5:18 AM   Specimen: Nasopharyngeal Swab  Result Value Ref Range Status   SARS Coronavirus 2 NEGATIVE NEGATIVE Final    Comment: Performed at [facility], [place] [address] [place], [place] [address]    Studies/Results: No results found.  Assessment: Bladder cancer  Procedure(s): XI ROBOTIC ASSISTED LAPAROSCOPIC COMPLETE CYSTECT ILEAL CONDUIT AND INDOCYANINE GREEN DYE INJECTION XI ROBOTIC ASSISTED LAPAROSCOPIC RADICAL PROSTATECTOMY PELVIC LYMPH NODE DISSECTION, 2 Days Post-Op  doing well.  Plan: Decrease IV fluids, ambulate, advance diet to clear liquid, Lovenox and SCDs for DVT prophylaxis   Link Snuffer, MD Urology 11/29/2021, 12:43 PM

## 2021-11-29 NOTE — Progress Notes (Signed)
NT went in to bathe patient earlier in shift and patient had multiple visitors coming up and he asked to wait until they left, NT offered again this evening and patient stated he wanted to wait until tomorrow because he was tired

## 2021-11-30 LAB — TYPE AND SCREEN
ABO/RH(D): O POS
Antibody Screen: NEGATIVE
Unit division: 0
Unit division: 0

## 2021-11-30 LAB — BASIC METABOLIC PANEL
Anion gap: 8 (ref 5–15)
BUN: 20 mg/dL (ref 8–23)
CO2: 22 mmol/L (ref 22–32)
Calcium: 7.7 mg/dL — ABNORMAL LOW (ref 8.9–10.3)
Chloride: 106 mmol/L (ref 98–111)
Creatinine, Ser: 1.24 mg/dL (ref 0.61–1.24)
GFR, Estimated: >60 mL/min (ref >60)
Glucose, Bld: 125 mg/dL — ABNORMAL HIGH (ref 70–99)
Potassium: 3.6 mmol/L (ref 3.5–5.1)
Sodium: 136 mmol/L (ref 135–145)

## 2021-11-30 LAB — HEMOGLOBIN AND HEMATOCRIT, BLOOD
HCT: 28.6 % — ABNORMAL LOW (ref 39.0–52.0)
Hemoglobin: 9.2 g/dL — ABNORMAL LOW (ref 13.0–17.0)

## 2021-11-30 LAB — BPAM RBC
Blood Product Expiration Date: 202301142359
Blood Product Expiration Date: 202301292359
Unit Type and Rh: 5100
Unit Type and Rh: 5100

## 2021-11-30 LAB — CREATININE, FLUID (PLEURAL, PERITONEAL, JP DRAINAGE): Creat, Fluid: 1.3 mg/dL

## 2021-11-30 LAB — GLUCOSE, CAPILLARY
Glucose-Capillary: 142 mg/dL — ABNORMAL HIGH (ref 70–99)
Glucose-Capillary: 196 mg/dL — ABNORMAL HIGH (ref 70–99)
Glucose-Capillary: 140 mg/dL — ABNORMAL HIGH (ref 70–99)
Glucose-Capillary: 149 mg/dL — ABNORMAL HIGH (ref 70–99)

## 2021-11-30 MED ORDER — SULFAMETHOXAZOLE-TRIMETHOPRIM 800-160 MG PO TABS
1.0000 | ORAL_TABLET | Freq: Two times a day (BID) | ORAL | Status: DC
  Administered 2021-11-30 – 2021-12-04 (×9): 1 via ORAL
  Filled 2021-11-30 (×9): qty 1

## 2021-11-30 NOTE — Progress Notes (Signed)
Physical Therapy Evaluation Patient Details Name: Adam Chung MRN: [id number] DOB: [birth year] Today's Date: 11/30/2021  History of Present Illness  71 yo male s/p ROBOTIC ASSISTED LAPAROSCOPIC RADICAL PROSTATECTOMY,  PELVIC LYMPH NODE DISSECTION per Dr, Tresa Moore on 11/27/21.PMH:sig for chole, IDDM2, chronic renal failure  Clinical Impression  Pt admitted with above diagnosis.   Pt works with PT begrudgingly. He  is motivated to be out of hospital however with multiple, varied complaints regarding his stay. Pt with mildly unsteady gait, multiple gait deviations however not amenable to corrections at this time. Will continue to follow.  Pt reports he will have good support at d/c, do not think he will need f/u PT post acute    Pt currently with functional limitations due to the deficits listed below (see PT Problem List). Pt will benefit from skilled PT to increase their independence and safety with mobility to allow discharge to the venue listed below.          Recommendations for follow up therapy are one component of a multi-disciplinary discharge planning process, led by the attending physician.  Recommendations may be updated based on patient status, additional functional criteria and insurance authorization.  Follow Up Recommendations No PT follow up    Assistance Recommended at Discharge None  Functional Status Assessment Patient has had a recent decline in their functional status and demonstrates the ability to make significant improvements in function in a reasonable and predictable amount of time.  Equipment Recommendations  None recommended by PT    Recommendations for Other Services       Precautions / Restrictions Precautions Precautions: Fall Restrictions Weight Bearing Restrictions: No      Mobility  Bed Mobility Overal bed mobility: Needs Assistance Bed Mobility: Rolling;Sidelying to Sit;Sit to Supine Rolling: Min guard;Supervision Sidelying to sit: Min  guard;HOB elevated   Sit to supine: Min guard   General bed mobility comments: incr time d/t pain, cues for log roll technique    Transfers Overall transfer level: Needs assistance Equipment used: Rolling walker (2 wheels) Transfers: Sit to/from Stand Sit to Stand: Min guard           General transfer comment: incr time, cues for hand placement and awareness of lines    Ambulation/Gait Ambulation/Gait assistance: Min guard Gait Distance (Feet): 260 Feet Assistive device: Rolling walker (2 wheels) Gait Pattern/deviations: Decreased stride length;Knee flexed in stance - right;Knee flexed in stance - left;Narrow base of support;Trunk flexed;Decreased dorsiflexion - right;Decreased dorsiflexion - left       General Gait Details: pt is not amenable to gait corrections (cues) at this time, feels reliant on RW for balance and pain control. steady with RW support  Stairs            Wheelchair Mobility    Modified Rankin (Stroke Patients Only)       Balance Overall balance assessment: Needs assistance Sitting-balance support: Feet supported;No upper extremity supported Sitting balance-Leahy Scale: Fair Sitting balance - Comments: NT further d/t pain   Standing balance support: During functional activity;Reliant on assistive device for balance Standing balance-Leahy Scale: Fair Standing balance comment: brielfy able to maintain static standing without UE support                             Pertinent Vitals/Pain Pain Assessment: Faces Faces Pain Scale: Hurts even more Pain Location: abd Pain Descriptors / Indicators: Grimacing;Sore Pain Intervention(s): Limited activity within patient's tolerance;Monitored during session;Repositioned;Premedicated before  session    Home Living Family/patient expects to be discharged to:: Private residence Living Arrangements: Alone Available Help at Discharge: Friend(s);Available 24 hours/day Type of Home: House Home  Access: Stairs to enter   [employer] of Steps: 2   Home Layout: One level Home Equipment: Conservation officer, nature (2 wheels)      Prior Function Prior Level of Function : Independent/Modified Independent;Needs assist             Mobility Comments: pt reports ind without AD ADLs Comments: reports independence     Hand Dominance        Extremity/Trunk Assessment   Upper Extremity Assessment Upper Extremity Assessment: Overall WFL for tasks assessed    Lower Extremity Assessment Lower Extremity Assessment: Overall WFL for tasks assessed       Communication   Communication: No difficulties  Cognition Arousal/Alertness: Awake/alert Behavior During Therapy: WFL for tasks assessed/performed (very unhappy, multiple complaints) Overall Cognitive Status: Within Functional Limits for tasks assessed                                          General Comments      Exercises     Assessment/Plan    PT Assessment Patient needs continued PT services  PT Problem List Pain;Decreased balance;Decreased activity tolerance;Decreased mobility       PT Treatment Interventions DME instruction;Therapeutic activities;Gait training;Functional mobility training;Therapeutic exercise;Patient/family education    PT Goals (Current goals can be found in the Care Plan section)  Acute Rehab PT Goals Patient Stated Goal: home soon, be able to get some rest PT Goal Formulation: With patient Time For Goal Achievement: 12/14/21 Potential to Achieve Goals: Good    Frequency Min 3X/week   Barriers to discharge        Co-evaluation               AM-PAC PT "6 Clicks" Mobility  Outcome Measure Help needed turning from your back to your side while in a flat bed without using bedrails?: A Little Help needed moving from lying on your back to sitting on the side of a flat bed without using bedrails?: A Little Help needed moving to and from a bed to a chair (including  a wheelchair)?: A Little Help needed standing up from a chair using your arms (e.g., wheelchair or bedside chair)?: A Little Help needed to walk in hospital room?: A Little Help needed climbing 3-5 steps with a railing? : A Little 6 Click Score: 18    End of Session Equipment Utilized During Treatment: Gait belt Activity Tolerance: Patient tolerated treatment well Patient left: in bed;with call bell/phone within reach;with bed alarm set   PT Visit Diagnosis: Other abnormalities of gait and mobility (R26.89);Difficulty in walking, not elsewhere classified (R26.2)    Time: 7989-2119 PT Time Calculation (min) (ACUTE ONLY): 19 min   Charges:   PT Evaluation $PT Eval Low Complexity: [place], PT  Acute Rehab Dept ([facility]) [phone] Pager [phone]  11/30/2021   [facility] 11/30/2021

## 2021-11-30 NOTE — Consult Note (Addendum)
[facility] Nurse ostomy consult note Patient receiving care in [place] Stoma type/location: RLQ ileal conduit Stomal assessment/size: 1 1/4" pink moist just above skin level Peristomal assessment: intact with two stents Treatment options for stomal/peristomal skin: Barrier ring Output. Thin clear yellow Ostomy pouching: 1pc. Convex pouch placed today but patient prefers 2 pc Convex (Not in [place]) Will order through [facility].  Education provided: Patient was able to open and close pouch and connect and disconnect from bedside drainage bag. Cleaning around the stoma with plain water. Explained stents and when they will fall out or be removed. Stents have to be placed into the bag. Bedside drainage bag can be used at night. Explained coiling issues of a new bedside drainage bag and hooking to the sheets of the bed. Patient did not think that family members would be available for teaching sessions and that he would be the one to take care of it. Educational materials were left at bedside along with 5 sets of supplies ordered. Enrolled patient in [place] program: Yes Welcome to [employer]! Thank you for choosing to enroll. A member of our team will be in touch shortly to explain our services, which include:  Personalized support from a [place], supplier, and insurance information Condition-specific information and connection to community resources If you have questions now, or in the future, please call us at [phone].  WOC will follow twice weekly.  Cathlean Marseilles Tamala Julian, MSN, RN, [place], Lysle Pearl, [facility] Associate Pager [phone]

## 2021-11-30 NOTE — Progress Notes (Signed)
3 Days Post-Op   Subjective/Chief Complaint:  1 -Bladder Cancer / End-Stage Bladder - s/p cysto + ICG injection, robotic cystoprostatectomy, bilateral pelvic lymph node dissection, ileal conduit urinary diversion 11/27/2021. 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 Path pending. Admitted 12/29 for bowel prep and stomal marking.  2 - Post-OP Ileus - small bowel anastamosis as part of planned conduit diversion 12/31. NPO initially, then ice chips, then clears 1/1. Some flatus 1/2 and advanced to fulls.   3 - Rt Orchitis - clinical Rt orchitis 10/2021 and placed on Cipro pre-op. UCX non-clonal. Improving on therapy.   4 - - Disposition / Rehab - independent in ADL's at baseline. Lives alone with friends and family very close. Planning on DC to friend's house at discharge this admission. PT eval pending.   Today "[name]" is progressing. Sore but ambulating and doing some indepenant transitions. Tolerating clears w/o emesis. Some flatus.    Objective: Vital signs in last 24 hours: Temp:  [98.1 F (36.7 C)-99 F (37.2 C)] 98.1 F (36.7 C) (01/02 0459) Pulse Rate:  [102-110] 102 (01/02 0459) Resp:  [18-20] 20 (01/02 0459) BP: (94-158)/(74-82) 158/82 (01/02 0459) SpO2:  [95 %-99 %] 95 % (01/02 0459) Last BM Date: 11/25/21  Intake/Output from previous day: 01/01 0701 - 01/02 0700 In: 360 [P.O.:360] Out: 3020 [Urine:1850; Drains:1170] Intake/Output this shift: Total I/O In: -  Out: 620 [Urine:290; Drains:330]  General appearance: alert and AOx3, pleasant.  Eyes: negative Nose: Nares normal. Septum midline. Mucosa normal. No drainage or sinus tenderness. Throat: lips, mucosa, and tongue normal; teeth and gums normal Neck: supple, symmetrical, trachea midline Back: symmetric, no curvature. ROM normal. No CVA tenderness. Resp: non-labored on room air.  Cardio: mild regular tachycardia (stable from pre-op) GI: soft,  non-tender; bowel sounds normal; no masses,  no organomegaly and recent incision / extraction sites c/d/I. RLQ Urostomy pink / patent with Rt (red) and Lt (blue) bander stents and copious pink tinged urine that is non-foul. LLQ JP with non-foul serosanguinous output.  Male genitalia: improved Rt testicle tenderness and induration. NO fluctence.  Extremities: extremities normal, atraumatic, no cyanosis or edema Pulses: 2+ and symmetric Neurologic: Grossly normal Incision/Wound: as per above.   Lab Results:  Recent Labs    11/29/21 0531 11/30/21 0451  HGB 9.0* 9.2*  HCT 27.6* 28.6*   BMET Recent Labs    11/29/21 0531 11/30/21 0451  NA 138 136  K 3.6 3.6  CL 108 106  CO2 23 22  GLUCOSE 117* 125*  BUN 25* 20  CREATININE 1.40* 1.24  CALCIUM 8.0* 7.7*   PT/INR No results for input(s): LABPROT, INR in the last 72 hours. ABG No results for input(s): PHART, HCO3 in the last 72 hours.  Invalid input(s): PCO2, PO2  Studies/Results: No results found.  Anti-infectives: Anti-infectives (From admission, onward)    Start     Dose/Rate Route Frequency Ordered Stop   11/27/21 2000  ciprofloxacin (CIPRO) IVPB 400 mg        400 mg 200 mL/hr over 60 Minutes Intravenous  Once 11/27/21 1510 11/27/21 2121   11/27/21 1630  clindamycin (CLEOCIN) IVPB 900 mg        900 mg 100 mL/hr over 30 Minutes Intravenous  Once 11/27/21 1510 11/27/21 1653   11/27/21 0630  ciprofloxacin (CIPRO) IVPB 400 mg        400 mg 200 mL/hr over 60 Minutes Intravenous On call 11/27/21 0622 11/27/21 0753  11/27/21 0630  clindamycin (CLEOCIN) IVPB 900 mg        900 mg 100 mL/hr over 30 Minutes Intravenous On call 11/27/21 0622 11/27/21 0753   11/26/21 2000  neomycin (MYCIFRADIN) tablet 500 mg  Status:  Discontinued        500 mg Oral Every 4 hours 11/26/21 1839 11/26/21 1852   11/26/21 1930  metroNIDAZOLE (FLAGYL) tablet 500 mg        500 mg Oral Every 4 hours 11/26/21 1839 11/26/21 2351        Assessment/Plan:  Doing well POD 3 s/p major extirpative surgery. Adv to full liq. Continue 1/2 MIVF. Check JP Cr. PT eval.   Will need HHRN for new ostomy teaching, final recs pending.  Remain in house likely several more days absed on current progress.     Alexis Frock 11/30/2021

## 2021-11-30 NOTE — TOC Initial Note (Signed)
Transition of Care [facility]) - Initial/Assessment Note    Patient Details  Name: Adam Chung MRN: [id number] Date of Birth: 03-11-1951  [facility] CM/SW Contact:    Ross Ludwig, LCSW Phone Number: 11/30/2021, 6:08 PM  Clinical Narrative:                  CSW was informed that patient may need [facility] RN for new urostomy.  CSW to continue to follow patient's progress throughout discharge planning.  Expected Discharge Plan: [place] Barriers to Discharge: Continued Medical Work up   Patient Goals and CMS Choice Patient states their goals for this hospitalization and ongoing recovery are:: To return back home with home health. CMS Medicare.gov Compare Post Acute Care list provided to:: Patient Choice offered to / list presented to : Patient  Expected Discharge Plan and Services Expected Discharge Plan: [place] Choice: [place] arrangements for the past 2 months: Single Family Home                           HH Arranged: RN          Prior Living Arrangements/Services Living arrangements for the past 2 months: Single Family Home Lives with:: Self Patient language and need for interpreter reviewed:: Yes Do you feel safe going back to the place where you live?: Yes      Need for Family Participation in Patient Care: No (Comment) Care giver support system in place?: No (comment)   Criminal Activity/Legal Involvement Pertinent to Current Situation/Hospitalization: No - Comment as needed  Activities of Daily Living Home Assistive Devices/Equipment: None ADL Screening (condition at time of admission) Patient's cognitive ability adequate to safely complete daily activities?: Yes Is the patient deaf or have difficulty hearing?: No Does the patient have difficulty seeing, even when wearing glasses/contacts?: No Does the patient have difficulty concentrating, remembering, or making  decisions?: No Patient able to express need for assistance with ADLs?: Yes Does the patient have difficulty dressing or bathing?: No Independently performs ADLs?: Yes (appropriate for developmental age) Does the patient have difficulty walking or climbing stairs?: No Weakness of Legs: None Weakness of Arms/Hands: None  Permission Sought/Granted Permission sought to share information with : Facility Sport and exercise psychologist, Family Supports, Case Manager Permission granted to share information with : Yes, Verbal Permission Granted  Share Information with NAMEDenny Levy Daughter [phone]  [phone]  Shellia Carwin Relative [phone]  [phone]  PHILLIPS,JOHNNY Brother [phone]  [phone]  Permission granted to share info w AGENCY: [place]        Emotional Assessment Appearance:: Appears stated age   Affect (typically observed): Appropriate, Calm, Accepting Orientation: : Oriented to Self, Oriented to Place, Oriented to  Time, Oriented to Situation Alcohol / Substance Use: Not Applicable Psych Involvement: No (comment)  Admission diagnosis:  Bladder cancer ([place]) [C67.9] Patient Active Problem List   Diagnosis Date Noted   Bladder cancer ([place]) 11/27/2021   Drug-induced liver injury 10/07/2021   GERD (gastroesophageal reflux disease) 10/06/2021   H. 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   Abnormal CT scan, stomach 09/24/2021   Malnutrition of moderate degree 09/24/2021  DKA (diabetic ketoacidosis) ([place]) 09/20/2021   Uncontrolled type 2 diabetes mellitus with hyperglycemia ([place]) 09/20/2021   Normocytic anemia 09/20/2021   Hyperkalemia 09/20/2021   Thrombocytosis 09/20/2021   Trigeminy 09/20/2021    Hyperphosphatemia 09/20/2021   Hypermagnesemia 09/20/2021   Pressure injury of skin 09/20/2021   Hypertension 08/07/2021   Type 2 diabetes mellitus with stage 4 chronic kidney disease, with long-term current use of insulin ([place]) 08/07/2021   AKI (acute kidney injury) ([place]) 08/07/2021   Urinary retention 08/07/2021   PCP:  Rusty Aus, MD Pharmacy:   [facility] [address], [place] - [place] [address] [place] [address] Phone: [phone] Fax: [phone]     Social Determinants of Health (SDOH) Interventions    Readmission Risk Interventions No flowsheet data found.

## 2021-11-30 NOTE — Plan of Care (Signed)
  Problem: Health Behavior/Discharge Planning: Goal: Ability to manage health-related needs will improve Outcome: Progressing   

## 2021-11-30 NOTE — Op Note (Signed)
NAME: Adam Chung, Adam Chung. MEDICAL RECORD NO: [id number] ACCOUNT NO: [id number] DATE OF BIRTH: [birth year] FACILITY: [facility] LOCATION: [id number] PHYSICIAN: Alexis Frock, MD  Operative Report   PREOPERATIVE DIAGNOSIS:  Bladder cancer, end-stage bladder.  PROCEDURE PERFORMED: 1.  Cystoscopy with injection of indocyanine green dye. 2.  Robotic-assisted laparoscopic radical cystectomy. 3.  Robotic radical prostatectomy. 4.  Bilateral pelvic lymph node dissection. 5.  Ileal conduit urinary diversion.  ESTIMATED BLOOD LOSS:  200 mL.  COMPLICATIONS:  None.  SPECIMENS:  1.  Right distal ureteral margin, frozen section negative. 2.  Right final distal ureteral margin. 3.  Left distal ureteral margin, frozen section negative. 4.  Final left distal ureteral margin. 5.  Right external iliac lymph nodes. 6.  Right obturator lymph nodes. 7.  Right common iliac lymph nodes. 8.  Left common iliac lymph nodes. 9.  Left external iliac lymph nodes. 10.  Left obturator lymph nodes. 11.  Bladder plus prostate en bloc.  ASSISTANT:  Debbrah Alar, PA  DRAINS: 1.  Right lower quadrant urostomy with right (red), left (blue) Bander stents. 2.  Left Lower quadrant JP drain to bulb suction.  INDICATIONS:  The patient is a pleasant, but unfortunate 71 year old man with a remote history of superficial low-grade bladder cancer, managed at a different facility.  He underwent BCG therapy for this many years ago. 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  He also has an element of  significant bilateral hydronephrosis and obstruction, placing him at danger of progressive renal insufficiency, also due to his end stage bladder.  Options were discussed for management including continued care with chronic  urethral catheter versus  suprapubic tube versus bilateral nephrostomy tubes versus more definitive management with cystectomy and urinary diversion, and he wished to proceed with the latter, after extensive counseling by multiple providers, informed consent was obtained and  placed on medical record.  He was admitted yesterday for stomal marking, bowel prep and labs, which were all acceptable.  PROCEDURE IN DETAIL:  The patient being verified, procedure being cystoscopy with injection of indocyanine green dye, robotic cystectomy, prostatectomy, ileal conduit urinary diversion was confirmed.  Procedure timeout was performed.  Intravenous  antibiotics administered.  General endotracheal anesthesia induced.  The patient was placed into a low lithotomy position.  Sterile field was created, prepped and draped the patient's penis, perineum and proximal thigh using iodine and his infraxiphoid  abdomen using chlorhexidine gluconate.  After he was further fashioned to the operating table using 3-inch tape with foam padding across his chest, arms were tucked to the side, padded with gel rolls.  His in situ Foley catheter was then removed. An LAVH  type drape was placed. Steep Trendelenburg positioning was performed.  Next, cystourethroscopy was performed using 24-French injection cystoscope with 0-degree lens.  Inspection of anterior and posterior urethra revealed multiple, multiple false  passages within the bulbar urethra and prostatic urethra, some of these being quite large and true lumen remaining at 12 o'clock.  This is amazingly tortuous.  The urinary bladder was quite abnormal with diffuse ulcerated bleeding mucosa throughout and  nonvisibility of the ureteral orifices. Next, 3 mL of indocyanine green dye was injected across the submucosal blebs in the area of the inter-trigone area for sentinel lymphangiography.  A sensor wire was left in place and the  cystoscope was exchanged  over the wire for a silicone  Foley catheter per urethra to straight drain.  Next, a high flow, low pressure pneumoperitoneum was obtained using Veress technique in the supraumbilical midline, having passed the aspiration and drop test.  An 8 mm robotic  camera port was then placed in the same location.  Laparoscopic examination of peritoneal cavity immediately did reveal some adhesions, especially of the sigmoid to the very inflamed superior dome of the bladder area.  Additional ports have been placed  as follows:  Right paramedian 8 mm robotic port, right far lateral 12 mm AirSeal assistant port, right paramedian 15 mm assistant port at the previously marked stomal site, left paramedian 8 mm robotic port, left far lateral 8 mm robotic port.  Robot was  docked and passed the electronic checks.  Initial attention was directed at adhesiolysis.  There was a very redundant sigmoid that was very densely adherent to the dome of the bladder and the posterior aspect of the bladder, with these tissues being  incredibly edematous consistent with chronic inflammation.  Very, very careful dissection was performed freeing several densely adherent loops of the sigmoid away from the dome of the bladder and the posterior wall.  Exquisite care was taken to avoid any  bowel injury, which did not occur.  This resulted in significant straightening of the sigmoid and reduction of its tortuosity.  Attention was directed at left-sided retroperitoneal dissection.  Incision was made into the inferior peritoneum lateral to  the descending colon from the area of the iliac vessel superiorly for a distance of approximately 10 cm and then inferiorly coursing lateral to the left medial umbilical ligament.  The left ureter was encountered as it coursed the iliac vessels,  circumferentially mobilized with a vessel loop.  We then dissected distally to the ureterovesical junction and proximally to the area of the gonadal crossing. The ureter was quite large in caliber,  consistent with chronic hydronephrosis.  In the distal  aspect of the ureter towards the area of the ureterovesical junction, it was immediately noted that there was severe desmoplastic reaction, completely encasing the urinary bladder.  This was not surprising.  Distal ureter was doubly clipped and ligated  with the proximal clip containing a blue tag suture and the left ureter was tucked out of the  true pelvis.  Frozen section negative for carcinoma.  The left bladder was then carefully swept away from the pelvic sidewall towards the area of endopelvic fascia and the  prostate was swept away as well.  This plane was very desmoplastic and exquisite care was taken to avoid any injury of the iliac vessels or obturator nerve, both of which were positively identified.  Attention was directed to the left sided lymph node  dissection.  All fibrofatty tissue within the boundaries of the aortic bifurcation, iliac bifurcation was dissected free.  Lymphostasis achieved with cold clips, set aside labeled common iliac lymph nodes.  Next, left external group was dissected free  with the boundaries being the left external iliac artery, vein, pelvic sidewall, iliac bifurcation.  Lymphostasis achieved with cold clips, set aside labeled left external lymph nodes.  Next, left obturator group was dissected free with the boundaries  being left external iliac vein, pelvic sidewall and obturator nerve.  Lymphostasis achieved with cold clips, set aside labeled obturator lymph nodes.   Left obturator nerve was inspected, found to be uninjured.  I was quite happy with left sided  dissection.  Attention was directed  at the right side.  The area of the ileocecal junction was identified, as verified by the terminal ileum, inserting into the cecum and the appendiceal junction.  The distal ileum was traced proximally for a distance of  approximately 20 cm to an area that appeared to have sufficient vascularity and mobility for conduit  formation.  This was marked using silk tag suture at its epiploic fat and a separate clip placed distal to this to denote proximal and distal  orientation. Incision was then made in the posterior peritoneum on the right side lateral to the cecum inferiorly, coursing lateral to the right medial umbilical ligament towards the anterior abdominal wall.  A Y-shaped extension was created off this  towards the area of the aortic bifurcation along the iliacs.  The right ureter was encountered as it coursed the iliac vessels, marked with vessel loop.  It also was quite abnormally hydronephrotic as expected.  This was dissected proximally to the  gonadal crossing and distally to the ureterovesical junction which was doubly clipped and ligated, proximal clip containing a white tag suture.  Again, this area was quite desmoplastic.  Similarly, the right bladder wall was swept away from the pelvic  sidewall towards the area of the endopelvic fascia on the right side.  This was quite desmoplastic as well, but somewhat less than the contralateral side.  Next, right-sided pelvic lymph node dissection was performed as per the same template as the left,  again achieving hemostasis and lymphostasis with cold clips, inspecting the right obturator nerve following dissection which remained uninjured.  Attention was directed at posterior dissection.  The planes posteriorly appeared incredibly desmoplastic.  An exquisitely careful dissection was performed in a plane just anterior to the anterior rectal fat, posterior to the vas and seminal vesicles towards the area of the prostatic apex.  Digital rectal exam was performed several times during dissection to  denote true rectal orientation and with exquisitely careful dissection, the posterior plane was developed towards the prostatic apex.  This exposed the vascular pedicles of the bladder and prostate, was controlled using white load stapler each side on  the bladder pedicle and  white load stapler each side on the prostate pedicle, which resulted in excellent hemostatic control of the bladder and prostate pedicles respectively.  Next, space of Retzius was developed between the medial umbilical ligaments  and the anterior bladder away from the abdominal wall, this does expose the dorsal venous complex which was controlled using green load stapler, taking care to prevent membranous urethral injury which did not occur.  The entire bladder plus prostate area  was placed on superior traction and membranous urethra was coldly transected and the in situ silicone catheter was doubly clipped and ligated and used as a bucket handle and an additional couple centimeters of retrograde dissection was performed  directly on to the posterior prostate, again in the same plane in the anterior perirectal fat.  This completely freed up the bladder plus prostate specimen, this was placed into a large EndoCatch bag for later retrieval.  Hemostasis was excellent.   Sponge and needle counts were correct.  Another digital rectal exam was performed under laparoscopic vision and no evidence of rectal violation was noted.  A closed suction drain was brought out through previous left lateral most robotic port site into  the peritoneal cavity.  The left ureter was tunneled under the colonic mesentery to the right side, the window just anterior to the aortic bifurcation and the right ureter,  left ureter, terminal ileum tag sutures were placed into Hem-o-lok clip, which  was grasped with a laparoscopic grasper via the 15 mm assistant port site.  The specimen bag was brought through the left paramedian robotic port site.  Robot was then undocked.  The specimen was retrieved by extending the previous camera port site  inferiorly for a distance of approximately 7 cm and into the left side of the umbilicus.  The bladder and prostate specimens were set aside for permanent pathology.  A wound protector type retractor was  then placed via the same location and the right  ureter, left ureter and conduit segments were grasped with Babcock graspers and brought into the operative field.  There appeared to be sufficient redundancy to allow for conduit formation.  Next, a 14 cm segment of distal ileum was taken to continuity  with a green load stapler at the previously marked stomal site.  Mesentery was developed using 1.5 loads of white load stapler distal, 1 load proximal, taking exquisite care to avoid devascularization of the conduit and anastomotic segments. Conduit  segment was laid into a retroperitoneal orientation.  Bowel-bowel anastomosis was performed using side-to-side anastomosis using green load stapler times 1.5 loads on the antimesenteric border and the free end  was oversewn using running silk, with second imbricating  layer of running silk.  The acute angle of anastomosis was bolstered with interrupted silk.  The mesenteric defect was reapproximated with interrupted silk.  The bowel-bowel anastomosis remained visibly viable and palpably patent, redelivered into  abdominal cavity.  The distal conduit staple line was removed.  The proximal staple line was excluded with a running Vicryl.  Attention was then directed at ureteroenteric anastomosis.  A 4 mm segment of proximal conduit mucosa and serosa was excised  towards the mesenteric border.  Four mucosal everting sutures were applied with 4-0 Vicryl.  The left ureter was trimmed and spatulated to geometry and permanent section set aside.  A heel stitch was applied with 4-0 Vicryl and blue color to 26 cm to  anastomosis, which was then further developed using two separate running suture lines of 4-0 Vicryl in a heel-to-toe fashion.   This revealed an excellent mucosa to mucosa apposition.  The stent was then anchored at the midpoint of the conduit using  interrupted, purposely air knot chromic.  A mirror image ureteroenteric anastomosis was performed of the right  ureter using a red color Bander stent 26 mm to anastomosis and final margin was sent.  I was quite happy with the geometry and vascularity of  the conduit segment.  Next, a quarter size diameter column of skin and subcutaneous tissue was excised at the previously marked stomal site down to the level of the fascia, which was dilated to accommodate 2 surgeon's fingers and the conduit was brought  through this.  After placing 4 fascial anchoring sutures, which was then anchored to the conduit to prevent parastomal hernia formation, four rose-budding sutures were then applied.  The extraction site was then inspected once again.  Hemostasis was  excellent.  Sponge and needle counts were correct.  The omentum was brought over this and the fascia was closed using figure-of-eight PDS x6 followed by reapproximation of Scarpa's with running Vicryl.  All incision sites were infiltrated with dilute  lipolyzed Marcaine and closed at the level of the skin using subcuticular Monocryl followed by Dermabond.  Final maturation of the stoma was performed using the rose-budding sutures and interrupted Vicryl in a quadrant fashion  x2 each quadrant. Stomal  appliance was placed.  Procedure was terminated.  The patient tolerated procedure well.  No immediate perioperative complications.  The patient was taken to postanesthesia care in stable condition.  Plan for inpatient admission.  Please note, first assistant Debbrah Alar was crucial for all portions of the surgery today.  She provided invaluable retraction, suctioning, vascular clipping, vascular stapling, specimen manipulation and general first assistance.   SHW D: 11/27/2021 1:33:29 pm T: 11/28/2021 2:28:00 am  JOB: [id number]/ [id number]

## 2021-11-30 NOTE — Progress Notes (Signed)
Mobility Specialist - Progress Note    11/30/21 1419  Mobility  Activity Ambulated in hall  Level of Assistance Contact guard assist, steadying assist  [place] wheel walker  Distance Ambulated (ft) 270 ft  Mobility Ambulated with assistance in hallway  Mobility Response Tolerated well  Mobility performed by Mobility specialist  $Mobility charge 1 Mobility   Pt agreeable to ambulate upon entry. Min A needed to help pt sit EOB. Once ready, pt used RW to ambulate 270 ft in hallway. Pt c/o of slight SOB and chest pain secondary to chest incision. Pt returned to room and requested to return to bed. Left with call bell at side and bed alarm on.   [place] Specialist Acute Rehabilitation Services Phone: [phone] 11/30/21, 2:21 PM

## 2021-12-01 LAB — BASIC METABOLIC PANEL
Anion gap: 9 (ref 5–15)
BUN: 19 mg/dL (ref 8–23)
CO2: 21 mmol/L — ABNORMAL LOW (ref 22–32)
Calcium: 7.8 mg/dL — ABNORMAL LOW (ref 8.9–10.3)
Chloride: 106 mmol/L (ref 98–111)
Creatinine, Ser: 1.3 mg/dL — ABNORMAL HIGH (ref 0.61–1.24)
GFR, Estimated: 59 mL/min — ABNORMAL LOW (ref >60)
Glucose, Bld: 159 mg/dL — ABNORMAL HIGH (ref 70–99)
Potassium: 3.9 mmol/L (ref 3.5–5.1)
Sodium: 136 mmol/L (ref 135–145)

## 2021-12-01 LAB — GLUCOSE, CAPILLARY
Glucose-Capillary: 150 mg/dL — ABNORMAL HIGH (ref 70–99)
Glucose-Capillary: 275 mg/dL — ABNORMAL HIGH (ref 70–99)
Glucose-Capillary: 171 mg/dL — ABNORMAL HIGH (ref 70–99)
Glucose-Capillary: 143 mg/dL — ABNORMAL HIGH (ref 70–99)

## 2021-12-01 LAB — HEMOGLOBIN AND HEMATOCRIT, BLOOD
HCT: 30.5 % — ABNORMAL LOW (ref 39.0–52.0)
Hemoglobin: 10.1 g/dL — ABNORMAL LOW (ref 13.0–17.0)

## 2021-12-01 LAB — SURGICAL PATHOLOGY

## 2021-12-01 NOTE — Progress Notes (Signed)
4 Days Post-Op   Subjective/Chief Complaint:  1 -Bladder Cancer / End-Stage Bladder - s/p cysto + ICG injection, robotic cystoprostatectomy, bilateral pelvic lymph node dissection, ileal conduit urinary diversion 11/27/2021. 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 Path pending. Admitted 12/29 for bowel prep and stomal marking.  2 - Post-OP Ileus - small bowel anastamosis as part of planned conduit diversion 12/31. NPO initially, then ice chips, then clears 1/1. Some flatus 1/2 and advanced to fulls. Continuing to pass flatus on 1/3 with good PO intake and advanced to carb consistent diet.   3 - Rt Orchitis - clinical Rt orchitis 10/2021 and placed on Cipro pre-op. UCX non-clonal. Improving on therapy.   4 - - Disposition / Rehab - independent in ADL's at baseline. Lives alone with friends and family very close. Planning on DC to friend's house at discharge this admission. Passed PT eval.   Today "Adam Chung" is progressing. Tolerating full liquids. Ambulating and passed PT eval. Passing flatus. Pain well controlled. JP Creatinine = serum Creatinine. Hb stable.    Objective: Vital signs in last 24 hours: Temp:  [97.7 F (36.5 C)-99.7 F (37.6 C)] 98.2 F (36.8 C) (01/03 1142) Pulse Rate:  [99-104] 104 (01/03 1142) Resp:  [18-20] 18 (01/03 1142) BP: (153-163)/(82-84) 153/83 (01/03 1142) SpO2:  [96 %-99 %] 99 % (01/03 1142) Last BM Date: 11/25/21  Intake/Output from previous day: 01/02 0701 - 01/03 0700 In: 4381 [P.O.:360; I.V.:4021] Out: 1575 [Urine:715; Drains:860] Intake/Output this shift: Total I/O In: -  Out: 750 [Urine:400; Drains:350]  General appearance: alert and AOx3, pleasant.  Eyes: negative Nose: Nares normal. Septum midline. Mucosa normal. No drainage or sinus tenderness. Throat: lips, mucosa, and tongue normal; teeth and gums normal Neck: supple, symmetrical, trachea midline Back: symmetric, no  curvature. ROM normal. No CVA tenderness. Resp: non-labored on room air.  Cardio: mild regular tachycardia (stable from pre-op) GI: soft, non-tender; bowel sounds normal; no masses,  no organomegaly and recent incision / extraction sites c/d/I. RLQ Urostomy pink / patent with Rt (red) and Lt (blue) bander stents and copious pink tinged urine that is non-foul. LLQ JP with non-foul serosanguinous output.  Male genitalia: improved Rt testicle tenderness and induration. NO fluctence.  Extremities: extremities normal, atraumatic, no cyanosis or edema Pulses: 2+ and symmetric Neurologic: Grossly normal Incision/Wound: as per above.   Lab Results:  Recent Labs    11/30/21 0451 12/01/21 0426  HGB 9.2* 10.1*  HCT 28.6* 30.5*    BMET Recent Labs    11/30/21 0451 12/01/21 0426  NA 136 136  K 3.6 3.9  CL 106 106  CO2 22 21*  GLUCOSE 125* 159*  BUN 20 19  CREATININE 1.24 1.30*  CALCIUM 7.7* 7.8*    PT/INR No results for input(s): LABPROT, INR in the last 72 hours. ABG No results for input(s): PHART, HCO3 in the last 72 hours.  Invalid input(s): PCO2, PO2  Studies/Results: No results found.  Anti-infectives: Anti-infectives (From admission, onward)    Start     Dose/Rate Route Frequency Ordered Stop   11/30/21 1400  sulfamethoxazole-trimethoprim (BACTRIM DS) 800-160 MG per tablet 1 tablet       Note to Pharmacy: To complete orchitis treatment course.   1 tablet Oral Every 12 hours 11/30/21 1243     11/27/21 2000  ciprofloxacin (CIPRO) IVPB 400 mg        400 mg 200 mL/hr over 60 Minutes Intravenous  Once 11/27/21 1510 11/27/21  2121   11/27/21 1630  clindamycin (CLEOCIN) IVPB 900 mg        900 mg 100 mL/hr over 30 Minutes Intravenous  Once 11/27/21 1510 11/27/21 1653   11/27/21 0630  ciprofloxacin (CIPRO) IVPB 400 mg        400 mg 200 mL/hr over 60 Minutes Intravenous On call 11/27/21 0622 11/27/21 0753   11/27/21 0630  clindamycin (CLEOCIN) IVPB 900 mg        900 mg 100  mL/hr over 30 Minutes Intravenous On call 11/27/21 0622 11/27/21 0753   11/26/21 2000  neomycin (MYCIFRADIN) tablet 500 mg  Status:  Discontinued        500 mg Oral Every 4 hours 11/26/21 1839 11/26/21 1852   11/26/21 1930  metroNIDAZOLE (FLAGYL) tablet 500 mg        500 mg Oral Every 4 hours 11/26/21 1839 11/26/21 2351       Assessment/Plan:  Doing well POD 4 s/p major extirpative surgery. Adv to Carb Consistent Diet.    Will need HHRN for new ostomy teaching, final recs pending.  Remain in house until full ROBF and confident in ostomy care.     Camden Mazzaferro [place] 12/01/2021

## 2021-12-01 NOTE — Progress Notes (Signed)
°   12/01/21 1400  Mobility  Activity Ambulated in hall  Level of Assistance Modified independent, requires aide device or extra time  Assistive Device Front wheel walker  Distance Ambulated (ft) 400 ft  Mobility Ambulated with assistance in hallway  Mobility Response Tolerated well  Mobility performed by Mobility specialist  $Mobility charge 1 Mobility   Pt agreeable to mobilize this afternoon. Ambulated about 44ft in hall with RW, tolerated well. No complaints. Left pt in bed with call bell at side. RN notified of session.  [place] Specialist Acute Rehab Services Office: [phone]

## 2021-12-01 NOTE — TOC Progression Note (Signed)
Transition of Care [facility]) - Progression Note    Patient Details  Name: Adam Chung MRN: [id number] Date of Birth: [birth year]  Transition of Care [facility]) CM/SW Contact  Ross Ludwig, [address] Phone Number: 12/01/2021, 1:10 PM  Clinical Narrative:     CSW spoke to [place] at [place] (formerly [facility]), patient is currently open to them for [facility] RN.  Patient will need resumption of care orders once he is medically ready for discharge.  Expected Discharge Plan: [place] Barriers to Discharge: Continued Medical Work up  Expected Discharge Plan and Services Expected Discharge Plan: [place] Choice: [place] arrangements for the past 2 months: Single Family Home                           HH Arranged: RN           Social Determinants of Health (SDOH) Interventions    Readmission Risk Interventions No flowsheet data found.

## 2021-12-02 LAB — GLUCOSE, CAPILLARY
Glucose-Capillary: 153 mg/dL — ABNORMAL HIGH (ref 70–99)
Glucose-Capillary: 215 mg/dL — ABNORMAL HIGH (ref 70–99)
Glucose-Capillary: 195 mg/dL — ABNORMAL HIGH (ref 70–99)
Glucose-Capillary: 146 mg/dL — ABNORMAL HIGH (ref 70–99)

## 2021-12-02 LAB — HEMOGLOBIN AND HEMATOCRIT, BLOOD
HCT: 30.1 % — ABNORMAL LOW (ref 39.0–52.0)
Hemoglobin: 9.9 g/dL — ABNORMAL LOW (ref 13.0–17.0)

## 2021-12-02 LAB — BASIC METABOLIC PANEL
Anion gap: 10 (ref 5–15)
BUN: 17 mg/dL (ref 8–23)
CO2: 21 mmol/L — ABNORMAL LOW (ref 22–32)
Calcium: 7.7 mg/dL — ABNORMAL LOW (ref 8.9–10.3)
Chloride: 105 mmol/L (ref 98–111)
Creatinine, Ser: 1.39 mg/dL — ABNORMAL HIGH (ref 0.61–1.24)
GFR, Estimated: 55 mL/min — ABNORMAL LOW (ref >60)
Glucose, Bld: 162 mg/dL — ABNORMAL HIGH (ref 70–99)
Potassium: 3.7 mmol/L (ref 3.5–5.1)
Sodium: 136 mmol/L (ref 135–145)

## 2021-12-02 MED ORDER — METOCLOPRAMIDE HCL 10 MG PO TABS
10.0000 mg | ORAL_TABLET | Freq: Three times a day (TID) | ORAL | Status: DC
  Administered 2021-12-02 – 2021-12-04 (×4): 10 mg via ORAL
  Filled 2021-12-02 (×4): qty 1

## 2021-12-02 NOTE — Progress Notes (Signed)
PHYSICAL THERAPY  Observed pt amb with RN full length of hallway with walker at Supervision level this morning so I did not Tx.  Pt has been evaluated with no post acute PT needs or rec.  Rica Koyanagi  PTA Acute  Rehabilitation Services Pager      [phone] Office      [phone]

## 2021-12-02 NOTE — Consult Note (Signed)
[place] Nurse ostomy follow up Stoma type/location: RLQ ileal conduit (POD 5)  Dr. Tresa Moore Stomal assessment/size: 1 and 3/8 inches round, red, raised, edematous. Two stents intact; red = right, blue = left Peristomal assessment: with peristomal irritant contact dermatitis from 6-8 o'clock. Washout of skin barrier and skin barrier ring are noted with back of pouch inspection. This skill is demonstrated to patient. Treatment options for stomal/peristomal skin: resize pouch, place skin barrier ring around stoma rather than on back of pouching system. Output: gold colored urine  Ostomy pouching: 1pc convex pouch with skin barrier ring Education provided:   Explained stoma characteristics (budded, flush, color, texture, care) Demonstrated pouch change (cutting new barrier, measuring stoma, cleaning peristomal skin and stoma, use of barrier ring). Patient removed pouching system today, also cut majority of pouch using pattern once drawn on back of skin barrier. Education on use wick in stoma to keep skin dry with pouch change Education on emptying when 1/3 to 1/2 full and how to empty Education on urine characteristics (sediment, mucous): Taught that mucus is "normal" for a person with a urostomy.  Demonstrated hooking pouch to nighttime drainage bag and disconnecting for ambulation. He is encouraged to do this today as he has not been. Rationale provided that we wish he to learn the "feel" of a semi-full pouch. Discussed bathing, dehydration. Patient taught to consume fluids to keep urine a clear yellow during the day  Patient would benefit from a HHRN 2-3 times weekly until independent in ostomy care and with increased confidence.  Also would benefit from referral to outpatient ostomy clinic at [place] patient questions regarding showering.  Supplies to room:  6 pouches (1-piece, convex) 7 skin barrier rings, 1 adapter, 1 urinary drainage bag.    Enrolled patient in [place] Start Discharge program: Yes, previously   [place] nursing team will follow, and will remain available to this patient, the nursing and medical teams.   Thanks, Maudie Flakes, MSN, RN, [place], Arther Abbott  Pager# [phone]

## 2021-12-02 NOTE — Progress Notes (Signed)
5 Days Post-Op   Subjective/Chief Complaint:  1 -Bladder Cancer / End-Stage Bladder - s/p cysto + ICG injection, robotic cystoprostatectomy, bilateral pelvic lymph node dissection, ileal conduit urinary diversion 11/27/2021. 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 Path pending. Admitted 12/29 for bowel prep and stomal marking.  2 - Post-OP Ileus - small bowel anastamosis as part of planned conduit diversion 12/31. NPO initially, then ice chips, then clears 1/1. Some flatus 1/2 and advanced to fulls. Continuing to pass flatus on 1/3 with good PO intake and advanced to carb consistent diet.   3 - Rt Orchitis - clinical Rt orchitis 10/2021 and placed on Cipro pre-op. UCX non-clonal. Improving on therapy.   4 - - Disposition / Rehab - independent in ADL's at baseline. Lives alone with friends and family very close. Planning on DC to friend's house at discharge this admission. Passed PT eval.   Today "Adam Chung" continues to do well. Tolerating carb consistent diet. Ambulating with staff in the hallways. Passing flatus. No BM yet. Pain well controlled. JP Creatinine = serum Creatinine on 1/2. Hb stable.    Objective: Vital signs in last 24 hours: Temp:  [98.2 F (36.8 C)-99.6 F (37.6 C)] 98.4 F (36.9 C) (01/04 0508) Pulse Rate:  [99-104] 99 (01/04 0508) Resp:  [16-18] 16 (01/04 0508) BP: (146-153)/(78-83) 147/78 (01/04 0508) SpO2:  [97 %-99 %] 98 % (01/04 0508) Last BM Date: 11/25/21  Intake/Output from previous day: 01/03 0701 - 01/04 0700 In: 1424.2 [P.O.:240; I.V.:1184.2] Out: 2170 [Urine:1000; Drains:1170] Intake/Output this shift: No intake/output data recorded.  General appearance: alert and AOx3, pleasant.  Eyes: negative Nose: Nares normal. Septum midline. Mucosa normal. No drainage or sinus tenderness. Throat: lips, mucosa, and tongue normal; teeth and gums normal Neck: supple, symmetrical, trachea  midline Back: symmetric, no curvature. ROM normal. No CVA tenderness. Resp: non-labored on room air.  Cardio: mild regular tachycardia (stable from pre-op) GI: soft, non-tender; bowel sounds normal; no masses,  no organomegaly and recent incision / extraction sites c/d/I. RLQ Urostomy pink / patent with Rt (red) and Lt (blue) bander stents and copious clear yellow urine that is non-foul. LLQ JP with non-foul serosanguinous output.  Male genitalia: improved Rt testicle tenderness and induration. NO fluctence.  Extremities: extremities normal, atraumatic, no cyanosis or edema Pulses: 2+ and symmetric Neurologic: Grossly normal Incision/Wound: as per above.   Lab Results:  Recent Labs    12/01/21 0426 12/02/21 0437  HGB 10.1* 9.9*  HCT 30.5* 30.1*    BMET Recent Labs    12/01/21 0426 12/02/21 0437  NA 136 136  K 3.9 3.7  CL 106 105  CO2 21* 21*  GLUCOSE 159* 162*  BUN 19 17  CREATININE 1.30* 1.39*  CALCIUM 7.8* 7.7*    PT/INR No results for input(s): LABPROT, INR in the last 72 hours. ABG No results for input(s): PHART, HCO3 in the last 72 hours.  Invalid input(s): PCO2, PO2  Studies/Results: No results found.  Anti-infectives: Anti-infectives (From admission, onward)    Start     Dose/Rate Route Frequency Ordered Stop   11/30/21 1400  sulfamethoxazole-trimethoprim (BACTRIM DS) 800-160 MG per tablet 1 tablet       Note to Pharmacy: To complete orchitis treatment course.   1 tablet Oral Every 12 hours 11/30/21 1243     11/27/21 2000  ciprofloxacin (CIPRO) IVPB 400 mg        400 mg 200 mL/hr over 60 Minutes Intravenous  Once 11/27/21 1510 11/27/21 2121   11/27/21 1630  clindamycin (CLEOCIN) IVPB 900 mg        900 mg 100 mL/hr over 30 Minutes Intravenous  Once 11/27/21 1510 11/27/21 1653   11/27/21 0630  ciprofloxacin (CIPRO) IVPB 400 mg        400 mg 200 mL/hr over 60 Minutes Intravenous On call 11/27/21 0622 11/27/21 0753   11/27/21 0630  clindamycin (CLEOCIN)  IVPB 900 mg        900 mg 100 mL/hr over 30 Minutes Intravenous On call 11/27/21 0622 11/27/21 0753   11/26/21 2000  neomycin (MYCIFRADIN) tablet 500 mg  Status:  Discontinued        500 mg Oral Every 4 hours 11/26/21 1839 11/26/21 1852   11/26/21 1930  metroNIDAZOLE (FLAGYL) tablet 500 mg        500 mg Oral Every 4 hours 11/26/21 1839 11/26/21 2351       Assessment/Plan:  Doing well POD 5 s/p major extirpative surgery. Tolerating Carb consistent diet and awaiting BM.  Will need HHRN for new ostomy teaching, final recs pending.  Remain in house until full ROBF and confident in ostomy care.     Adam Chung 12/02/2021

## 2021-12-03 LAB — GLUCOSE, CAPILLARY
Glucose-Capillary: 167 mg/dL — ABNORMAL HIGH (ref 70–99)
Glucose-Capillary: 176 mg/dL — ABNORMAL HIGH (ref 70–99)
Glucose-Capillary: 141 mg/dL — ABNORMAL HIGH (ref 70–99)
Glucose-Capillary: 168 mg/dL — ABNORMAL HIGH (ref 70–99)

## 2021-12-03 MED ORDER — BISACODYL 10 MG RE SUPP
10.0000 mg | Freq: Once | RECTAL | Status: AC
  Administered 2021-12-03: 10 mg via RECTAL
  Filled 2021-12-03: qty 1

## 2021-12-03 NOTE — TOC Transition Note (Signed)
Transition of Care [facility]) - CM/SW Discharge Note   Patient Details  Name: Adam Chung MRN: [id number] Date of Birth: Feb 09, 1951  [facility] CM/SW Contact:  Dessa Phi, RN Phone Number: 12/03/2021, 2:22 PM   Clinical Narrative:  Advanced HHRN rep Ramond Marrow aware of d/c in am if stable.No further CM needs.     Final next level of care: [place] Barriers to Discharge: No Barriers Identified   Patient Goals and CMS Choice Patient states their goals for this hospitalization and ongoing recovery are:: To return back home with home health. CMS Medicare.gov Compare Post Acute Care list provided to:: Patient Choice offered to / list presented to : Patient  Discharge Placement                       Discharge Plan and Services     Post Acute Care Choice: Home Health                    HH Arranged: RN [place]: [place] ([place]) Date [facility] Agency Contacted: 12/03/21 Time Agenda: [id number] Representative spoke with at [place]: [place] ([place]) Interventions     Readmission Risk Interventions No flowsheet data found.

## 2021-12-03 NOTE — Progress Notes (Signed)
6 Days Post-Op   Subjective/Chief Complaint:  1 -Bladder Cancer / End-Stage Bladder - s/p cysto + ICG injection, robotic cystoprostatectomy, bilateral pelvic lymph node dissection, ileal conduit urinary diversion 11/27/2021. 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 Path pending. Admitted 12/29 for bowel prep and stomal marking.  2 - Post-OP Ileus - small bowel anastamosis as part of planned conduit diversion 12/31. NPO initially, then ice chips, then clears 1/1. Some flatus 1/2 and advanced to fulls. Continuing to pass flatus on 1/3 with good PO intake and advanced to carb consistent diet. Small emesis on 12/02/20. Continuing to pass gas. Plan for dulcolax suppository on 12/03/20.   3 - Rt Orchitis - clinical Rt orchitis 10/2021 and placed on Cipro pre-op. UCX non-clonal. Improving on therapy.   4 - - Disposition / Rehab - independent in ADL's at baseline. Lives alone with friends and family very close. Planning on DC to friend's house at discharge this admission. Passed PT eval.   Today "[name]" continues to do well. Tolerating carb consistent diet. Small episode of emesis in the evening. Enjoying cereal ,multiple times daily. Ambulating with staff in the hallways. Passing flatus. No BM yet. Pain well controlled. JP Creatinine = serum Creatinine on 1/2. Hb stable.    Objective: Vital signs in last 24 hours: Temp:  [97.9 F (36.6 C)-98.7 F (37.1 C)] 97.9 F (36.6 C) (01/05 0534) Pulse Rate:  [97-102] 101 (01/05 0534) Resp:  [16-17] 16 (01/05 0534) BP: (149-161)/(74-82) 161/81 (01/05 0534) SpO2:  [96 %-97 %] 96 % (01/05 0534) Last BM Date: 11/25/21  Intake/Output from previous day: 01/04 0701 - 01/05 0700 In: 1427 [P.O.:240; I.V.:1187] Out: 1960 [Urine:925; Drains:1035] Intake/Output this shift: No intake/output data recorded.  General appearance: alert and AOx3, pleasant.  Eyes: negative Nose: Nares normal. Septum  midline. Mucosa normal. No drainage or sinus tenderness. Throat: lips, mucosa, and tongue normal; teeth and gums normal Neck: supple, symmetrical, trachea midline Back: symmetric, no curvature. ROM normal. No CVA tenderness. Resp: non-labored on room air.  Cardio: mild regular tachycardia (stable from pre-op) GI: soft, non-tender; bowel sounds normal; no masses,  no organomegaly and recent incision / extraction sites c/d/I. RLQ Urostomy pink / patent with Rt (red) and Lt (blue) bander stents and copious clear yellow urine that is non-foul. LLQ JP with non-foul serosanguinous output.  Male genitalia: improved Rt testicle tenderness and induration. NO fluctence.  Extremities: extremities normal, atraumatic, no cyanosis or edema Pulses: 2+ and symmetric Neurologic: Grossly normal Incision/Wound: as per above.   Lab Results:  Recent Labs    12/01/21 0426 12/02/21 0437  HGB 10.1* 9.9*  HCT 30.5* 30.1*    BMET Recent Labs    12/01/21 0426 12/02/21 0437  NA 136 136  K 3.9 3.7  CL 106 105  CO2 21* 21*  GLUCOSE 159* 162*  BUN 19 17  CREATININE 1.30* 1.39*  CALCIUM 7.8* 7.7*    PT/INR No results for input(s): LABPROT, INR in the last 72 hours. ABG No results for input(s): PHART, HCO3 in the last 72 hours.  Invalid input(s): PCO2, PO2  Studies/Results: No results found.  Anti-infectives: Anti-infectives (From admission, onward)    Start     Dose/Rate Route Frequency Ordered Stop   11/30/21 1400  sulfamethoxazole-trimethoprim (BACTRIM DS) 800-160 MG per tablet 1 tablet       Note to Pharmacy: To complete orchitis treatment course.   1 tablet Oral Every 12 hours 11/30/21 1243  11/27/21 2000  ciprofloxacin (CIPRO) IVPB 400 mg        400 mg 200 mL/hr over 60 Minutes Intravenous  Once 11/27/21 1510 11/27/21 2121   11/27/21 1630  clindamycin (CLEOCIN) IVPB 900 mg        900 mg 100 mL/hr over 30 Minutes Intravenous  Once 11/27/21 1510 11/27/21 1653   11/27/21 0630   ciprofloxacin (CIPRO) IVPB 400 mg        400 mg 200 mL/hr over 60 Minutes Intravenous On call 11/27/21 0622 11/27/21 0753   11/27/21 0630  clindamycin (CLEOCIN) IVPB 900 mg        900 mg 100 mL/hr over 30 Minutes Intravenous On call 11/27/21 0622 11/27/21 0753   11/26/21 2000  neomycin (MYCIFRADIN) tablet 500 mg  Status:  Discontinued        500 mg Oral Every 4 hours 11/26/21 1839 11/26/21 1852   11/26/21 1930  metroNIDAZOLE (FLAGYL) tablet 500 mg        500 mg Oral Every 4 hours 11/26/21 1839 11/26/21 2351       Assessment/Plan:  Doing well POD 5 s/p major extirpative surgery. Tolerating Carb consistent diet and awaiting BM. Dulcolax suppository today.   Will need HHRN for new ostomy teaching, final recs pending.  Remain in house until full ROBF and confident in ostomy care.     Xiadani Damman Abu-Salha 12/03/2021

## 2021-12-03 NOTE — Care Management Important Message (Signed)
Important Message  Patient Details IM Letter given to the Patient. Name: Adam Chung MRN: [id number] Date of Birth: 1951/03/13   Medicare Important Message Given:  Yes     Kerin Salen 12/03/2021, 4:02 PM

## 2021-12-03 NOTE — Progress Notes (Signed)
Pt alert,c/o of not able to pass gas this evening and pt states that it makes him nauseous and very uncomfortable,offered 3x to walked with pt in the hallway or do some walking in the room but pt refuses also pt refuse to take his scheduled PO meds this evening. Pt lying in bed and will continue plan of care.

## 2021-12-03 NOTE — Consult Note (Addendum)
[place] Nurse ostomy follow up Stoma type/location: RLQ ileal conduit.  POD 6.  Dr. Tresa Moore Patient reports two BMs today, also that he is able to empty the pouch into the toilet. He is not yet eating as well as desired; hasn't had the supplement because "it has gotten warm, food doesn't taste right", etc. Taught that he is still healing and that maximizing nutrition in these early post operative days is important for wound healing and tissue repair and regeneration.  Patient indicates understanding and has no other questions for me at this time. I will see again in the morning. Patient is hopeful for discharge tomorrow and he is reminded that that decision is at the physician's discretion. Patient will benefit from continued support and reinforcement of education as well as monitoring of the changing ostomy size and stent securement by a HHRN. He would also benefit from an appointment with the outpatient ostomy clinic at [facility] post discharge. If you agree, please order/secure those services.  [place] nursing team will follow, and will remain available to this patient, the nursing and medical teams.  Thanks, Maudie Flakes, MSN, RN, [place], Arther Abbott  Pager# [phone]

## 2021-12-03 NOTE — Progress Notes (Signed)
°   12/03/21 1400  Mobility  Activity Ambulated in hall  Level of Assistance Modified independent, requires aide device or extra time  Assistive Device Other (Comment) (IV pole)  Distance Ambulated (ft) 400 ft  Mobility Ambulated with assistance in hallway  Mobility Response Tolerated well  Mobility performed by Mobility specialist  $Mobility charge 1 Mobility   Pt agreeable to mobilize this afternoon. Ambulated about 485ft in hall with IV pole, tolerated well. No complaints. Left pt in bed with call bell at side and RN present.  [place] Specialist Acute Rehab Services Office: [phone]

## 2021-12-04 ENCOUNTER — Other Ambulatory Visit (HOSPITAL_COMMUNITY): Payer: Self-pay

## 2021-12-04 LAB — GLUCOSE, CAPILLARY
Glucose-Capillary: 159 mg/dL — ABNORMAL HIGH (ref 70–99)
Glucose-Capillary: 174 mg/dL — ABNORMAL HIGH (ref 70–99)

## 2021-12-04 MED ORDER — SULFAMETHOXAZOLE-TRIMETHOPRIM 800-160 MG PO TABS
1.0000 | ORAL_TABLET | Freq: Two times a day (BID) | ORAL | 0 refills | Status: DC
Start: 2021-12-04 — End: 2021-12-04
  Filled 2021-12-04: qty 20, 10d supply, fill #0

## 2021-12-04 MED ORDER — OXYCODONE-ACETAMINOPHEN 5-325 MG PO TABS
1.0000 | ORAL_TABLET | Freq: Four times a day (QID) | ORAL | 0 refills | Status: DC | PRN
  Filled 2021-12-04: qty 20, 5d supply, fill #0

## 2021-12-04 MED ORDER — SENNOSIDES-DOCUSATE SODIUM 8.6-50 MG PO TABS
2.0000 | ORAL_TABLET | Freq: Every day | ORAL | 0 refills | Status: DC
  Filled 2021-12-04: qty 28, 14d supply, fill #0

## 2021-12-04 MED ORDER — SULFAMETHOXAZOLE-TRIMETHOPRIM 800-160 MG PO TABS: 1.0000 | ORAL_TABLET | Freq: Two times a day (BID) | ORAL | 0 refills | Status: AC

## 2021-12-04 MED ORDER — SENNOSIDES-DOCUSATE SODIUM 8.6-50 MG PO TABS: 2.0000 | ORAL_TABLET | Freq: Every day | ORAL | 0 refills | Status: AC

## 2021-12-04 MED ORDER — CALCIUM CARBONATE ANTACID 500 MG PO CHEW
1.0000 | CHEWABLE_TABLET | Freq: Three times a day (TID) | ORAL | Status: DC
  Administered 2021-12-04: 200 mg via ORAL
  Filled 2021-12-04: qty 1

## 2021-12-04 MED ORDER — ACETAMINOPHEN 500 MG PO TABS: 1000.0000 mg | ORAL_TABLET | Freq: Four times a day (QID) | ORAL | 2 refills | Status: AC | PRN

## 2021-12-04 MED ORDER — ACETAMINOPHEN 500 MG PO TABS
1000.0000 mg | ORAL_TABLET | Freq: Four times a day (QID) | ORAL | 2 refills | Status: DC | PRN
  Filled 2021-12-04: qty 100, 13d supply, fill #0

## 2021-12-04 NOTE — Discharge Summary (Signed)
[facility] Discharge Summary  Admit date: 11/26/2021  Discharge date and time: 12/04/21   Discharge to: Home  Discharge Service: Urology  Discharge Attending Physician:  Phebe Colla, MD  Discharge  Diagnoses: Bladder cancer [facility])  Secondary Diagnosis: Principal Problem:   Bladder cancer ([place])   OR Procedures: Procedure(s): XI ROBOTIC ASSISTED LAPAROSCOPIC COMPLETE CYSTECT ILEAL CONDUIT AND INDOCYANINE GREEN DYE INJECTION XI ROBOTIC ASSISTED [place] 11/27/2021   Ancillary Procedures: None   Discharge Day Services: The patient was seen and examined by the Urology team both in the morning and immediately prior to discharge.  Vital signs and laboratory values were stable and within normal limits.  The physical exam was benign and unchanged and all surgical wounds were examined.  Discharge instructions were explained and all questions answered.  Subjective  No acute events overnight. Pain Controlled. No fever or chills.  Objective Patient Vitals for the past 8 hrs:  BP Temp Pulse Resp SpO2  12/04/21 0514 (!) 150/101 98.1 F (36.7 C) (!) 104 16 99 %   No intake/output data recorded.  General Appearance:        No acute distress Lungs:                       Normal work of breathing on room air Heart:                                Regular rate and rhythm Abdomen:                         Soft, non-tender, non-distended. Incisions clean dry and in tact. Urostomy is pink and viable appearing with two ureteral stents emanating. Clear yellow urine.  Extremities:                      Warm and well perfused   Hospital Course:  This is a 69yoM patient with a history of bladder dysfunctino and bladder cancer. Foley catheter dependent.   The patient underwent Robotic Radical Cystoprostatectomy, ileal conduit urinary diversion, pelvic/iliac lymph node dissection on 11/27/2021 with Dr. Tresa Moore.  The patient tolerated the  procedure well, was extubated in the OR, and afterwards was taken to the PACU for routine post-surgical care. When stable the patient was transferred to the floor.   The patient did well postoperatively.  He experienced flatus on POD2. He had bowel movements and exhibited full return of bowel function by POD6. During his hospitalization he was kept on chemical DVT prophylaxis with lovenox. He was also treated with bactrim for surgical prophylaxis and with ureteral stents in place. He worked with physical therapy and with the ostomy nurses and exhibited good care of his one pieace urostomy pouch.  The patients diet was slowly advanced and at the time of discharge was tolerating a regular diet.  The patient was discharged home 7 Days Post-Op, at which point was tolerating a regular solid diet, was able to void spontaneously, have adequate pain control with P.O. pain medication, and could ambulate without difficulty. The patient will follow up with [place] for post op check on 12/17/21. He will complete ten more days of bactrim. He was discharged with 10 pills of oxycodone for breakthrough pain. .   Condition at Discharge: Improved  Discharge Medications:  Allergies as of 12/04/2021       Reactions  Apple Anaphylaxis   Penicillins Swelling   Swelling of lip per patient Did it involve swelling of the face/tongue/throat, SOB, or low BP? Yes Did it involve sudden or severe rash/hives, skin peeling, or any reaction on the inside of your mouth or nose? No Did you need to seek medical attention at a hospital or doctor's office? Yes When did it last happen?    childhood   If all above answers are "NO", may proceed with cephalosporin use.        Medication List     TAKE these medications    Ensure Max Protein Liqd Take 330 mLs (11 oz total) by mouth 2 (two) times daily.   (feeding supplement) PROSource Plus liquid Take 30 mLs by mouth 2 (two) times daily between meals.   acetaminophen 500 MG  tablet Commonly known as: TYLENOL Take 2 tablets (1,000 mg total) by mouth every 6 (six) hours as needed. What changed:  medication strength how much to take reasons to take this   ciprofloxacin 500 MG tablet Commonly known as: CIPRO Take 1 tablet (500 mg total) by mouth every 12 (twelve) hours.   glimepiride 4 MG tablet Commonly known as: AMARYL Take 4 mg by mouth daily with breakfast.   insulin aspart 100 UNIT/ML FlexPen Commonly known as: NOVOLOG Inject 4 Units into the skin 3 (three) times daily with meals. Give only if patient consumes > 50% of meals.   insulin glargine-yfgn 100 UNIT/ML injection Commonly known as: SEMGLEE Inject 0.1 mLs (10 Units total) into the skin daily.   Levemir FlexTouch 100 UNIT/ML FlexPen Generic drug: insulin detemir Inject 15 Units into the skin 2 (two) times daily.   multivitamin with minerals Tabs tablet Take 1 tablet by mouth daily.   ondansetron 8 MG disintegrating tablet Commonly known as: Zofran ODT Take 1 tablet (8 mg total) by mouth every 8 (eight) hours as needed for nausea or vomiting.   senna-docusate 8.6-50 MG tablet Commonly known as: Senokot-S Take 1 tablet by mouth 2 (two) times daily. What changed: Another medication with the same name was added. Make sure you understand how and when to take each.   senna-docusate 8.6-50 MG tablet Commonly known as: Senokot-S Take 2 tablets by mouth at bedtime for 14 days. What changed: You were already taking a medication with the same name, and this prescription was added. Make sure you understand how and when to take each.   sulfamethoxazole-trimethoprim 800-160 MG tablet Commonly known as: BACTRIM DS Take 1 tablet by mouth every 12 (twelve) hours for 10 days.

## 2021-12-12 ENCOUNTER — Other Ambulatory Visit (HOSPITAL_COMMUNITY): Payer: Self-pay

## 2021-12-14 ENCOUNTER — Other Ambulatory Visit (HOSPITAL_COMMUNITY): Payer: Self-pay

## 2021-12-31 NOTE — Telephone Encounter (Signed)
Error

## 2022-06-10 LAB — COLOGUARD: COLOGUARD: NEGATIVE

## 2022-09-06 ENCOUNTER — Emergency Department (HOSPITAL_COMMUNITY): Payer: Medicare Other

## 2022-09-06 ENCOUNTER — Other Ambulatory Visit: Payer: Self-pay

## 2022-09-06 ENCOUNTER — Inpatient Hospital Stay (HOSPITAL_COMMUNITY)
Admission: EM | Admit: 2022-09-06 | Discharge: 2022-09-09 | DRG: 682 | Disposition: A | Discharge status: Skilled Nursing Facility | Payer: Medicare Other | Source: Ambulatory Visit | Attending: Internal Medicine | Admitting: Internal Medicine

## 2022-09-06 ENCOUNTER — Encounter (HOSPITAL_COMMUNITY): Payer: Self-pay

## 2022-09-06 DIAGNOSIS — Z8551 Personal history of malignant neoplasm of bladder: Secondary | ICD-10-CM | POA: Diagnosis not present

## 2022-09-06 DIAGNOSIS — R4182 Altered mental status, unspecified: Secondary | ICD-10-CM | POA: Diagnosis present

## 2022-09-06 DIAGNOSIS — E861 Hypovolemia: Secondary | ICD-10-CM | POA: Diagnosis not present

## 2022-09-06 DIAGNOSIS — E872 Acidosis, unspecified: Secondary | ICD-10-CM | POA: Diagnosis not present

## 2022-09-06 DIAGNOSIS — N189 Chronic kidney disease, unspecified: Secondary | ICD-10-CM | POA: Diagnosis not present

## 2022-09-06 DIAGNOSIS — Z7984 Long term (current) use of oral hypoglycemic drugs: Secondary | ICD-10-CM | POA: Diagnosis not present

## 2022-09-06 DIAGNOSIS — Z91018 Allergy to other foods: Secondary | ICD-10-CM

## 2022-09-06 DIAGNOSIS — I10 Essential (primary) hypertension: Secondary | ICD-10-CM | POA: Diagnosis present

## 2022-09-06 DIAGNOSIS — E878 Other disorders of electrolyte and fluid balance, not elsewhere classified: Secondary | ICD-10-CM | POA: Diagnosis present

## 2022-09-06 DIAGNOSIS — E860 Dehydration: Secondary | ICD-10-CM | POA: Diagnosis present

## 2022-09-06 DIAGNOSIS — R531 Weakness: Secondary | ICD-10-CM

## 2022-09-06 DIAGNOSIS — Z880 Allergy status to penicillin: Secondary | ICD-10-CM

## 2022-09-06 DIAGNOSIS — C679 Malignant neoplasm of bladder, unspecified: Secondary | ICD-10-CM | POA: Diagnosis present

## 2022-09-06 DIAGNOSIS — Z79899 Other long term (current) drug therapy: Secondary | ICD-10-CM

## 2022-09-06 DIAGNOSIS — I129 Hypertensive chronic kidney disease with stage 1 through stage 4 chronic kidney disease, or unspecified chronic kidney disease: Secondary | ICD-10-CM | POA: Diagnosis present

## 2022-09-06 DIAGNOSIS — N184 Chronic kidney disease, stage 4 (severe): Secondary | ICD-10-CM | POA: Diagnosis present

## 2022-09-06 DIAGNOSIS — E11649 Type 2 diabetes mellitus with hypoglycemia without coma: Secondary | ICD-10-CM | POA: Diagnosis not present

## 2022-09-06 DIAGNOSIS — E1165 Type 2 diabetes mellitus with hyperglycemia: Secondary | ICD-10-CM | POA: Diagnosis present

## 2022-09-06 DIAGNOSIS — N1832 Chronic kidney disease, stage 3b: Secondary | ICD-10-CM | POA: Diagnosis present

## 2022-09-06 DIAGNOSIS — E1122 Type 2 diabetes mellitus with diabetic chronic kidney disease: Secondary | ICD-10-CM | POA: Diagnosis present

## 2022-09-06 DIAGNOSIS — Z87891 Personal history of nicotine dependence: Secondary | ICD-10-CM

## 2022-09-06 DIAGNOSIS — G9341 Metabolic encephalopathy: Secondary | ICD-10-CM

## 2022-09-06 DIAGNOSIS — E875 Hyperkalemia: Secondary | ICD-10-CM | POA: Diagnosis present

## 2022-09-06 DIAGNOSIS — N170 Acute kidney failure with tubular necrosis: Principal | ICD-10-CM | POA: Diagnosis present

## 2022-09-06 DIAGNOSIS — N179 Acute kidney failure, unspecified: Secondary | ICD-10-CM | POA: Diagnosis not present

## 2022-09-06 DIAGNOSIS — Z794 Long term (current) use of insulin: Secondary | ICD-10-CM | POA: Diagnosis not present

## 2022-09-06 LAB — CBC WITH DIFFERENTIAL/PLATELET
Abs Immature Granulocytes: 0.19 10*3/uL — ABNORMAL HIGH (ref 0.00–0.07)
Basophils Absolute: 0.1 10*3/uL (ref 0.0–0.1)
Basophils Relative: 1 %
Eosinophils Absolute: 0.2 10*3/uL (ref 0.0–0.5)
Eosinophils Relative: 2 %
HCT: 39 % (ref 39.0–52.0)
Hemoglobin: 12.3 g/dL — ABNORMAL LOW (ref 13.0–17.0)
Immature Granulocytes: 2 %
Lymphocytes Relative: 14 %
Lymphs Abs: 1.5 10*3/uL (ref 0.7–4.0)
MCH: 30.5 pg (ref 26.0–34.0)
MCHC: 31.5 g/dL (ref 30.0–36.0)
MCV: 96.8 fL (ref 80.0–100.0)
Monocytes Absolute: 0.9 10*3/uL (ref 0.1–1.0)
Monocytes Relative: 8 %
Neutro Abs: 7.6 10*3/uL (ref 1.7–7.7)
Neutrophils Relative %: 73 %
Platelets: 278 10*3/uL (ref 150–400)
RBC: 4.03 MIL/uL — ABNORMAL LOW (ref 4.22–5.81)
RDW: 13.6 % (ref 11.5–15.5)
WBC: 10.4 10*3/uL (ref 4.0–10.5)
nRBC: 0 % (ref 0.0–0.2)

## 2022-09-06 LAB — BLOOD GAS, VENOUS
Acid-base deficit: 22.9 mmol/L — ABNORMAL HIGH (ref 0.0–2.0)
Bicarbonate: 5.5 mmol/L — ABNORMAL LOW (ref 20.0–28.0)
O2 Saturation: 93.3 %
Patient temperature: 37
pCO2, Ven: 19 mmHg — CL (ref 44–60)
pH, Ven: 7.07 — CL (ref 7.25–7.43)
pO2, Ven: 62 mmHg — ABNORMAL HIGH (ref 32–45)

## 2022-09-06 LAB — URINALYSIS, ROUTINE W REFLEX MICROSCOPIC
Bilirubin Urine: NEGATIVE
Glucose, UA: NEGATIVE mg/dL
Ketones, ur: NEGATIVE mg/dL
Nitrite: NEGATIVE
Protein, ur: >=300 mg/dL — AB
Specific Gravity, Urine: 1.013 (ref 1.005–1.030)
pH: 6 (ref 5.0–8.0)

## 2022-09-06 LAB — COMPREHENSIVE METABOLIC PANEL
ALT: 11 U/L (ref 0–44)
AST: 13 U/L — ABNORMAL LOW (ref 15–41)
Albumin: 4 g/dL (ref 3.5–5.0)
Alkaline Phosphatase: 107 U/L (ref 38–126)
BUN: 126 mg/dL — ABNORMAL HIGH (ref 8–23)
CO2: <7 mmol/L — ABNORMAL LOW (ref 22–32)
Calcium: 10.1 mg/dL (ref 8.9–10.3)
Chloride: 123 mmol/L — ABNORMAL HIGH (ref 98–111)
Creatinine, Ser: 5.34 mg/dL — ABNORMAL HIGH (ref 0.61–1.24)
GFR, Estimated: 11 mL/min — ABNORMAL LOW (ref >60)
Glucose, Bld: 225 mg/dL — ABNORMAL HIGH (ref 70–99)
Potassium: 5.7 mmol/L — ABNORMAL HIGH (ref 3.5–5.1)
Sodium: 135 mmol/L (ref 135–145)
Total Bilirubin: 0.7 mg/dL (ref 0.3–1.2)
Total Protein: 7.7 g/dL (ref 6.5–8.1)

## 2022-09-06 LAB — CBG MONITORING, ED: Glucose-Capillary: 198 mg/dL — ABNORMAL HIGH (ref 70–99)

## 2022-09-06 LAB — LACTIC ACID, PLASMA: Lactic Acid, Venous: 1.3 mmol/L (ref 0.5–1.9)

## 2022-09-06 MED ORDER — INSULIN ASPART 100 UNIT/ML IJ SOLN
0.0000 [IU] | Freq: Three times a day (TID) | INTRAMUSCULAR | Status: DC
  Administered 2022-09-07: 1 [IU] via SUBCUTANEOUS
  Filled 2022-09-06: qty 0.06

## 2022-09-06 MED ORDER — ACETAMINOPHEN 500 MG PO TABS: 1000.0000 mg | ORAL_TABLET | Freq: Four times a day (QID) | ORAL | Status: DC | PRN

## 2022-09-06 MED ORDER — SODIUM BICARBONATE 8.4 % IV SOLN
50.0000 meq | Freq: Once | INTRAVENOUS | Status: AC
  Administered 2022-09-06: 50 meq via INTRAVENOUS
  Filled 2022-09-06: qty 50

## 2022-09-06 MED ORDER — LACTATED RINGERS IV BOLUS
1000.0000 mL | Freq: Once | INTRAVENOUS | Status: AC
  Administered 2022-09-06: 1000 mL via INTRAVENOUS

## 2022-09-06 MED ORDER — STERILE WATER FOR INJECTION IV SOLN
INTRAVENOUS | Status: AC
  Filled 2022-09-06: qty 1000
  Filled 2022-09-06: qty 150
  Filled 2022-09-06: qty 1000
  Filled 2022-09-06: qty 150

## 2022-09-06 MED ORDER — HEPARIN SODIUM (PORCINE) 5000 UNIT/ML IJ SOLN
5000.0000 [IU] | Freq: Three times a day (TID) | INTRAMUSCULAR | Status: DC
  Administered 2022-09-06 – 2022-09-09 (×8): 5000 [IU] via SUBCUTANEOUS
  Filled 2022-09-06 (×8): qty 1

## 2022-09-06 MED ORDER — SODIUM CHLORIDE 0.9 % IV BOLUS
1000.0000 mL | Freq: Once | INTRAVENOUS | Status: AC
  Administered 2022-09-06: 1000 mL via INTRAVENOUS

## 2022-09-06 NOTE — H&P (Signed)
History and Physical    Patient: Adam Chung UMP:[id number] DOB: [birth year] DOA: 09/06/2022 DOS: the patient was seen and examined on 09/06/2022 PCP: Rusty Aus, MD  Patient coming from: Home  Chief Complaint: AMS  HPI: Adam Chung is a 71 y.o. male with medical history significant of HTN, T2DM, CKD4, bladder cancer s/p cystoprostatectomy ileal conduit who presents with AMS.  Pt lives alone and somewhat disoriented so daughter at bedside provides most of the hx. He had a tooth pulled about a week and a half ago and continued to have pain but avoided pain medication. He was then placed on Clindamycin by his dentist.  Unclear if he has been eating and drink but daughter noticed ice cream and popiscles at bedside. Had some dry heaving and at least one day of known diarrhea. Then 3 days ago became confused and saying  things that do not make sense. Have generalized tremors.  Pt on olmesartan but unclear if he has taking it. No issues with his ileal conduit.   In the ED, afebrile, mildly tachycardic, normotension on room air.  WBC 11.4, hemoglobin 12.3 Sodium of 135, K of 5.7, chloride of 123, CO2 of less than 7 with incalculable anion gap.  Creatinine had risen to 5.34 from a prior of 1.39. UA with large leukocyte, negative nitrate and >300 protein.  CT A/P without signs of hydronephrosis or hydroureter. No other acute abdominal pathology.  Renal ultrasound also negative for obstruction  EDP discussed with Dr. Hollie Salk from nephrology who will see patient.  Also discussed with Dr. Louis Meckel from urology.  Urine does not necessarily show infection particular with the conduit and no antibiotics recommended pending culture. Hospitalist then consulted for admission.  Review of Systems: unable to review all systems due to the inability of the patient to answer questions. Past Medical History:  Diagnosis Date   Cancer [facility])    bladder   Diabetes mellitus without complication [facility])     Past Surgical History:  Procedure Laterality Date   BIOPSY  09/24/2021   Procedure: BIOPSY;  Surgeon: Wilford Corner, MD;  Location: [facility];  Service: Endoscopy;;   BLADDER SURGERY     CYSTOSCOPY N/A 08/09/2021   Procedure: CYSTOSCOPY WITH INSERTION OF FOLEY CATHETER;  Surgeon: Janith Lima, MD;  Location: [facility] ORS;  Service: Urology;  Laterality: N/A;   ESOPHAGOGASTRODUODENOSCOPY (EGD) WITH PROPOFOL N/A 09/24/2021   Procedure: ESOPHAGOGASTRODUODENOSCOPY (EGD) WITH PROPOFOL;  Surgeon: Wilford Corner, MD;  Location: [facility];  Service: Endoscopy;  Laterality: N/A;   fatty tumor excision     LYMPH NODE DISSECTION Bilateral 11/27/2021   Procedure: PELVIC LYMPH NODE DISSECTION;  Surgeon: Alexis Frock, MD;  Location: [facility];  Service: Urology;  Laterality: Bilateral;   ROBOT ASSISTED LAPAROSCOPIC COMPLETE CYSTECT ILEAL CONDUIT N/A 11/27/2021   Procedure: XI ROBOTIC ASSISTED LAPAROSCOPIC COMPLETE CYSTECT ILEAL CONDUIT AND INDOCYANINE GREEN DYE INJECTION;  Surgeon: Alexis Frock, MD;  Location: [facility];  Service: Urology;  Laterality: N/A;   ROBOT ASSISTED LAPAROSCOPIC RADICAL PROSTATECTOMY N/A 11/27/2021   Procedure: XI ROBOTIC ASSISTED LAPAROSCOPIC RADICAL PROSTATECTOMY;  Surgeon: Alexis Frock, MD;  Location: [facility];  Service: Urology;  Laterality: N/A;   TRANSURETHRAL RESECTION OF PROSTATE N/A 08/09/2021   Procedure: TRANSURETHRAL RESECTION OF THE PROSTATE (TURP);  Surgeon: Janith Lima, MD;  Location: [facility] ORS;  Service: Urology;  Laterality: N/A;   Social History:  reports that he has quit smoking. He has never used smokeless tobacco. He reports that he does not  currently use alcohol. He reports that he does not use drugs.  Allergies  Allergen Reactions   Apple Juice Anaphylaxis   Penicillins Swelling    Swelling of lip per patient  Did it involve swelling of the face/tongue/throat, SOB, or low BP? Yes Did it involve sudden or severe rash/hives, skin peeling, or  any reaction on the inside of your mouth or nose? No Did you need to seek medical attention at a hospital or doctor's office? Yes When did it last happen?    childhood   If all above answers are "NO", may proceed with cephalosporin use.      Family History  Problem Relation Age of Onset   Heart disease Neg Hx     Prior to Admission medications   Medication Sig Start Date End Date Taking? Authorizing Provider  acetaminophen (TYLENOL) 500 MG tablet Take 2 tablets (1,000 mg total) by mouth every 6 (six) hours as needed. 12/04/21 12/04/22 Yes Abu-Salha, Inda Castle, MD  clindamycin (CLEOCIN) 150 MG capsule Take 150 mg by mouth 4 (four) times daily. 08/30/22  Yes [provider]  glimepiride (AMARYL) 4 MG tablet Take 4 mg by mouth daily with breakfast.   Yes [provider]  insulin detemir (LEVEMIR FLEXTOUCH) 100 UNIT/ML FlexPen Inject 15 Units into the skin 2 (two) times daily.   Yes [provider]  ciprofloxacin (CIPRO) 500 MG tablet Take 1 tablet (500 mg total) by mouth every 12 (twelve) hours. Patient not taking: Reported on 09/06/2022 11/22/21   Hendricks Limes, PA-C  Ensure Max Protein (ENSURE MAX PROTEIN) LIQD Take 330 mLs (11 oz total) by mouth 2 (two) times daily. Patient not taking: Reported on 11/18/2021 09/29/21   Eugenie Filler, MD  insulin aspart (NOVOLOG) 100 UNIT/ML FlexPen Inject 4 Units into the skin 3 (three) times daily with meals. Give only if patient consumes > 50% of meals. Patient not taking: Reported on 10/15/2021 09/29/21   Eugenie Filler, MD  insulin glargine-yfgn [facility]) 100 UNIT/ML injection Inject 0.1 mLs (10 Units total) into the skin daily. Patient not taking: Reported on 10/15/2021 09/30/21   Eugenie Filler, MD  Multiple Vitamin (MULTIVITAMIN WITH MINERALS) TABS tablet Take 1 tablet by mouth daily. Patient not taking: Reported on 11/18/2021 09/30/21   Eugenie Filler, MD  Nutritional Supplements (,FEEDING SUPPLEMENT, PROSOURCE  PLUS) liquid Take 30 mLs by mouth 2 (two) times daily between meals. Patient not taking: Reported on 10/11/2021 09/30/21   Eugenie Filler, MD  olmesartan (BENICAR) 20 MG tablet Take 20 mg by mouth daily. 08/18/22   [provider]  ondansetron (ZOFRAN ODT) 8 MG disintegrating tablet Take 1 tablet (8 mg total) by mouth every 8 (eight) hours as needed for nausea or vomiting. Patient not taking: Reported on 11/18/2021 09/10/21   Kuppelweiser, Cassie L, RPH-CPP  oxyCODONE-acetaminophen (PERCOCET) 5-325 MG tablet Take 1 tablet by mouth every 6 (six) hours as needed for severe pain or moderate pain (post-operatively). Patient not taking: Reported on 09/06/2022 12/04/21 12/04/22  Alexis Frock, MD  senna-docusate (SENOKOT-S) 8.6-50 MG tablet Take 1 tablet by mouth 2 (two) times daily. Patient not taking: Reported on 10/11/2021 09/29/21   Eugenie Filler, MD    Physical Exam: Vitals:   09/06/22 2130 09/06/22 2144 09/06/22 2145 09/06/22 2230  BP:  (!) 125/55  119/64  Pulse: 99 (!) 101 99 95  Resp: '15 19 16 12  '$ Temp:      TempSrc:      SpO2: 100%  100% 100% 100%  Weight:      Height:       Constitutional: NAD, calm, comfortable, elderly male laying flat in bed Eyes: lids and conjunctivae normal ENMT: Mucous membranes are moist.  Right lower molar tooth extraction site noted without edema or edema.  No cervical lymphadenopathy or any facial edema. Neck: normal, supple Respiratory: clear to auscultation bilaterally, no wheezing, no crackles. Normal respiratory effort.  Cardiovascular: Regular rate and rhythm, no murmurs / rubs / gallops. No extremity edema.  Abdomen: no tenderness, ileal conduit with urine output in the bag noted. Bowel sounds positive.  Musculoskeletal: no clubbing / cyanosis. No joint deformity upper and lower extremities. Good ROM, no contractures. Normal muscle tone.  Skin: no rashes, lesions, ulcers. No induration Neurologic: CN 2-12 grossly intact. Strength 5/5 in  all 4.  Occasional upper extremity jerking and tremors.  Alert and oriented only to self, place but not time. Psychiatric: Normal judgment and insight.  Normal mood.  Restless confused towards the end of evaluation and wanted to climb out of bed and leave. Data Reviewed:  See HPI  Assessment and Plan: * Acute kidney injury superimposed on chronic kidney disease ([place]) -presenting creatinine of 5.34 from 1.39  -No obstructive pattern seen on CT A/P and renal U/S -pH of 7.07 with CO <7 and incalculable anion gap -Suspect patient had increased dental pain leading to decrease intake causing dehydration and AKI. -Nephrology has evaluated and recommends keeping on IV bicarb infusion for 24 hours -Avoid nephrotoxic agent. Hold home ACE-I.  -previously on Clindamycin-dental extraction site without signs of infection. Will d/c.  Metabolic acidosis Should improve with bicarb infusion  Bladder cancer [facility]) s/p cystoprostatectomy and ileal conduit  -no obstruction on CT A/P or renal ultrasound -follows urology outpt  Hyperkalemia Follow with bicarb infusion  Type 2 diabetes mellitus with stage 4 chronic kidney disease, with long-term current use of insulin ([place]) W/hyperglycemia on presentation -very sensitive SSI with meals.  Hypertension Holding ACE-I due to AKI, BP is controlled      Advance Care Planning:   Code Status: Full Code   Consults: nephrology  Family Communication: Discussed with daughter and son-in-law at bedside  Severity of Illness: The appropriate patient status for this patient is INPATIENT. Inpatient status is judged to be reasonable and necessary in order to provide the required intensity of service to ensure the patient's safety. The patient's presenting symptoms, physical exam findings, and initial radiographic and laboratory data in the context of their chronic comorbidities is felt to place them at high risk for further clinical deterioration. Furthermore, it is  not anticipated that the patient will be medically stable for discharge from the hospital within 2 midnights of admission.   * I certify that at the point of admission it is my clinical judgment that the patient will require inpatient hospital care spanning beyond 2 midnights from the point of admission due to high intensity of service, high risk for further deterioration and high frequency of surveillance required.*  Author: Orene Desanctis, DO 09/06/2022 11:45 PM  For on call review [email or web address].

## 2022-09-06 NOTE — Assessment & Plan Note (Addendum)
s/p cystoprostatectomy and ileal conduit  -no obstruction on CT A/P or renal ultrasound -follows urology outpt -Likely colonized.  Urine culture reviewed during hospitalization and not considered infectious.

## 2022-09-06 NOTE — Assessment & Plan Note (Signed)
Secondary to uremia from acute on chronic CKD -pt was restless and wanted to leave. Will have sitter to re-direct

## 2022-09-06 NOTE — ED Triage Notes (Signed)
Patient 's friend called a relative and then the patient went to see his PCP. Patient's daughter reports that the patient had a tooth pulled 2 weeks ago, but was not prescribed an antibiotic until last week. Patient reports that he felt confused this AM and reports that he has been in the bed for the past 3 days. Patient has a history of sepsis. Patient arrived to the ED with a urostomy

## 2022-09-06 NOTE — ED Notes (Signed)
Patient transported to CT 

## 2022-09-06 NOTE — Assessment & Plan Note (Addendum)
-  patient has history of CKD3b. Baseline creat ~ 1.4 - 1.7, eGFR 40 - 45 - patient presented with increase in creat >0.3 mg/dL above baseline, creat increase >1.5x baseline presumed to have occurred within past 7 days PTA - creat 5.34 and BUN 126 on admission - had reported poor oral intake on admission -No obstruction noted on renal imaging - possible ATN from prolonged prerenal; also on ARB at home. No reports of severe hypoTN or hypoxia  -Creatinine down to 2.49 at discharge - repeat BMP at follow-up

## 2022-09-06 NOTE — Assessment & Plan Note (Addendum)
-   Home regimen resumed 

## 2022-09-06 NOTE — Assessment & Plan Note (Addendum)
-   Due to renal failure.  Improved with bicarb drip

## 2022-09-06 NOTE — Consult Note (Signed)
[facility]  HISTORY AND PHYSICAL  Adam Chung is an 71 y.o. male.    Chief Complaint: tremors, weakness, encephalopathy  HPI: Pt is a 25M with a PMH sig for HTN,  DM II, bladder cancer s/p cystectomy with ileal conduit 10/2021 and recent tooth extraction who is now seen in consultation at the request of Dr Alvino Chapel for evaluation and recommendations for AKI on CKD.    Pt had a tooth extraction about 2 weeks ago- since then hasn't really felt well per dtr and son- in-law.  Has been laying around the house, not really eating or drinking much.  Says he's had a prescription for BP meds- dtr thinks amlodipine (benicar listed on OP meds from [facility])- and has been taking that too.  Had been having more tooth pain so was prescribed clindamycin this week.  Has been taking but not as prescribed.  Noticed to be "talking out of his head" today, found by his brother.  Took him to PCP who recommended ED eval for acute encephalopathy.    In ED, multiple elyte derangements noted.  K 5.7, CO2 < 7, Bun 126, Cr 5.34 up from a baseline of 1.2-1.3.  CXR negative, Renal US and CT abd/ pelvis without obstruction.  Noted to have urine in his bag.  Given 1L NS and 1 LR.  In this setting we are asked to see.    Pt reports tremors which are irritating.  Maybe some stomach upset- unclear if he was dry heaving or having n/v.  Has been laying in bed for the past 2 days.  No NSAIDS per pt report.    PMH: Past Medical History:  Diagnosis Date   Cancer ([place])    bladder   Diabetes mellitus without complication ([place])    PSH: Past Surgical History:  Procedure Laterality Date   BIOPSY  09/24/2021   Procedure: BIOPSY;  Surgeon: Wilford Corner, MD;  [facility];  Service: Endoscopy;;   BLADDER SURGERY     CYSTOSCOPY N/A 08/09/2021   Procedure: CYSTOSCOPY WITH INSERTION OF FOLEY CATHETER;  Surgeon: Janith Lima, MD;  Location: [facility];  Service: Urology;  Laterality: N/A;    ESOPHAGOGASTRODUODENOSCOPY (EGD) WITH PROPOFOL N/A 09/24/2021   Procedure: ESOPHAGOGASTRODUODENOSCOPY (EGD) WITH PROPOFOL;  Surgeon: Wilford Corner, MD;  [facility];  Service: Endoscopy;  Laterality: N/A;   fatty tumor excision     LYMPH NODE DISSECTION Bilateral 11/27/2021   Procedure: PELVIC LYMPH NODE DISSECTION;  Surgeon: Alexis Frock, MD;  [facility];  Service: Urology;  Laterality: Bilateral;   ROBOT ASSISTED LAPAROSCOPIC COMPLETE CYSTECT ILEAL CONDUIT N/A 11/27/2021   Procedure: XI ROBOTIC ASSISTED LAPAROSCOPIC COMPLETE CYSTECT ILEAL CONDUIT AND INDOCYANINE GREEN DYE INJECTION;  Surgeon: Alexis Frock, MD;  [facility];  Service: Urology;  Laterality: N/A;   ROBOT ASSISTED LAPAROSCOPIC RADICAL PROSTATECTOMY N/A 11/27/2021   Procedure: XI ROBOTIC ASSISTED LAPAROSCOPIC RADICAL PROSTATECTOMY;  Surgeon: Alexis Frock, MD;  [facility];  Service: Urology;  Laterality: N/A;   TRANSURETHRAL RESECTION OF PROSTATE N/A 08/09/2021   Procedure: TRANSURETHRAL RESECTION OF THE PROSTATE (TURP);  Surgeon: Janith Lima, MD;  Location: [facility];  Service: Urology;  Laterality: N/A;     Past Medical History:  Diagnosis Date   Cancer ([place])    bladder   Diabetes mellitus without complication ([facility])     Medications: Tylenol prn  Cholecalciferol 4 mg daily Clindamycin 150 mg QID Glimepiride 4 mg daily Levimir 15 u BID Olmesartan 20 mg  daily   ALLERGIES:   Allergies  Allergen Reactions   Apple Juice Anaphylaxis   Penicillins Swelling    Swelling of lip per patient  Did it involve swelling of the face/tongue/throat, SOB, or low BP? Yes Did it involve sudden or severe rash/hives, skin peeling, or any reaction on the inside of your mouth or nose? No Did you need to seek medical attention at a hospital or doctor's office? Yes When did it last happen?    childhood   If all above answers are "NO", may proceed with cephalosporin use.      FAM HX: Family  History  Problem Relation Age of Onset   Heart disease Neg Hx     Social History:   reports that he has quit smoking. He has never used smokeless tobacco. He reports that he does not currently use alcohol. He reports that he does not use drugs.  ROS: ROS: all other systems reviewed and are negative except as per HPI  Blood pressure 109/65, pulse 100, temperature 97.7 F (36.5 C), resp. rate 15, height '5\' 11"'$  (1.803 m), weight 79.4 kg, SpO2 100 %. PHYSICAL EXAM: Physical Exam GEN NAD, sitting on gurney HEENT EOMI PERRL, dry MM NECK  flat neck veins PULM clear  CV RRR ABD soft, nontender, R ileal conduit with clear yellow urine with mucus EXT no LE edema NEURO + tremor/ asterixis, some confusion SKIN dry   Results for orders placed or performed during the hospital encounter of 09/06/22 (from the past 48 hour(s))  CBG monitoring, ED     Status: Abnormal   Collection Time: 09/06/22  4:58 PM  Result Value Ref Range   Glucose-Capillary 198 (H) 70 - 99 mg/dL    Comment: Glucose reference range applies only to samples taken after fasting for at least 8 hours.  Lactic acid, plasma     Status: None   Collection Time: 09/06/22  5:18 PM  Result Value Ref Range   Lactic Acid, Venous 1.3 0.5 - 1.9 mmol/L    Comment: Performed at [facility], [place] [address] [place], [place] [address]  Comprehensive metabolic panel     Status: Abnormal   Collection Time: 09/06/22  5:18 PM  Result Value Ref Range   Sodium 135 135 - 145 mmol/L   Potassium 5.7 (H) 3.5 - 5.1 mmol/L   Chloride 123 (H) 98 - 111 mmol/L   CO2 <7 (L) 22 - 32 mmol/L   Glucose, Bld 225 (H) 70 - 99 mg/dL    Comment: Glucose reference range applies only to samples taken after fasting for at least 8 hours.   BUN 126 (H) 8 - 23 mg/dL    Comment: RESULTS CONFIRMED BY MANUAL DILUTION   Creatinine, Ser 5.34 (H) 0.61 - 1.24 mg/dL   Calcium 10.1 8.9 - 10.3 mg/dL   Total Protein 7.7 6.5 - 8.1 g/dL   Albumin 4.0 3.5  - 5.0 g/dL   AST 13 (L) 15 - 41 U/L   ALT 11 0 - 44 U/L   Alkaline Phosphatase 107 38 - 126 U/L   Total Bilirubin 0.7 0.3 - 1.2 mg/dL   GFR, Estimated 11 (L) >60 mL/min    Comment: (NOTE) Calculated using the CKD-EPI Creatinine Equation (2021)    Anion gap NOT CALCULATED 5 - 15    Comment: Performed at [facility] [address] [place],  [address]  CBC with Differential     Status: Abnormal   Collection Time:  09/06/22  5:18 PM  Result Value Ref Range   WBC 10.4 4.0 - 10.5 K/uL   RBC 4.03 (L) 4.22 - 5.81 MIL/uL   Hemoglobin 12.3 (L) 13.0 - 17.0 g/dL   HCT 39.0 39.0 - 52.0 %   MCV 96.8 80.0 - 100.0 fL   MCH 30.5 26.0 - 34.0 pg   MCHC 31.5 30.0 - 36.0 g/dL   RDW 13.6 11.5 - 15.5 %   Platelets 278 150 - 400 K/uL   nRBC 0.0 0.0 - 0.2 %   Neutrophils Relative % 73 %   Neutro Abs 7.6 1.7 - 7.7 K/uL   Lymphocytes Relative 14 %   Lymphs Abs 1.5 0.7 - 4.0 K/uL   Monocytes Relative 8 %   Monocytes Absolute 0.9 0.1 - 1.0 K/uL   Eosinophils Relative 2 %   Eosinophils Absolute 0.2 0.0 - 0.5 K/uL   Basophils Relative 1 %   Basophils Absolute 0.1 0.0 - 0.1 K/uL   Immature Granulocytes 2 %   Abs Immature Granulocytes 0.19 (H) 0.00 - 0.07 K/uL    Comment: Performed at [facility], [place] [address], [place], [place] [address]  Urinalysis, Routine w reflex microscopic     Status: Abnormal   Collection Time: 09/06/22  7:10 PM  Result Value Ref Range   Color, Urine YELLOW YELLOW   APPearance TURBID (A) CLEAR   Specific Gravity, Urine 1.013 1.005 - 1.030   pH 6.0 5.0 - 8.0   Glucose, UA NEGATIVE NEGATIVE mg/dL   Hgb urine dipstick SMALL (A) NEGATIVE   Bilirubin Urine NEGATIVE NEGATIVE   Ketones, ur NEGATIVE NEGATIVE mg/dL   Protein, ur >=300 (A) NEGATIVE mg/dL   Nitrite NEGATIVE NEGATIVE   Leukocytes,Ua LARGE (A) NEGATIVE   RBC / HPF 0-5 0 - 5 RBC/hpf   WBC, UA 11-20 0 - 5 WBC/hpf   Bacteria, UA RARE (A) NONE SEEN   Squamous Epithelial /  LPF 0-5 0 - 5   Mucus PRESENT     Comment: Performed at [facility], [place] [address], [place], [place] [address]    CT ABDOMEN PELVIS WO CONTRAST  Result Date: 09/06/2022 CLINICAL DATA:  Kidney failure, acute renal failure EXAM: CT ABDOMEN AND PELVIS WITHOUT CONTRAST TECHNIQUE: Multidetector CT imaging of the abdomen and pelvis was performed following the standard protocol without IV contrast. RADIATION DOSE REDUCTION: This exam was performed according to the departmental dose-optimization program which includes automated exposure control, adjustment of the mA and/or kV according to patient size and/or use of iterative reconstruction technique. COMPARISON:  CT abdomen pelvis 11/22/2021, MRI abdomen 10/07/2021 FINDINGS: Lower chest: Trace hiatal hernia.  No acute abnormality. Hepatobiliary: Vague 1.6 cm hypodensity within the right hepatic lobe (2:13) may be artifactual. Redemonstration of vague right hepatic dome 0.6 cm hypodensity. Status post cholecystectomy. No biliary dilatation. Pancreas: No focal lesion. Normal pancreatic contour. No surrounding inflammatory changes. No main pancreatic ductal dilatation. Spleen: Normal in size without focal abnormality. Adrenals/Urinary Tract: No adrenal nodule bilaterally. No nephrolithiasis and no hydronephrosis. No definite contour-deforming renal mass. No ureterolithiasis or hydroureter. Status post radical prostatectomy with right ileal conduit formation. Stomach/Bowel: Stomach is within normal limits. No evidence of bowel wall thickening or dilatation. Colonic diverticulosis. The appendix is not definitely identified with no inflammatory changes in the right lower quadrant to suggest acute appendicitis. Vascular/Lymphatic: No abdominal aorta or iliac aneurysm. Mild atherosclerotic plaque of the aorta and its branches. No abdominal, pelvic, or inguinal lymphadenopathy. Reproductive: Prostatectomy. Other:  No intraperitoneal free fluid. No  intraperitoneal free gas. No organized fluid collection. Musculoskeletal: No abdominal wall hernia or abnormality. Right gluteal musculature lipomatous lesion (2:67). No suspicious lytic or blastic osseous lesions. Stable densely sclerotic lesion within the left acetabula (2:73). Similar finding within the right iliac bone (2:56). No acute displaced fracture. Multilevel degenerative changes of the spine. IMPRESSION: 1. No acute intra-intrapelvic abnormality with limited evaluation on this noncontrast study in a patient status post radical prostatectomy with right ileal conduit formation. 2. Trace hiatal hernia. 3. Colonic diverticulosis with no acute diverticulitis. 4.  Aortic Atherosclerosis (ICD10-I70.0). Electronically Signed   By: Iven Finn M.D.   On: 09/06/2022 21:08   US Renal  Result Date: 09/06/2022 CLINICAL DATA:  Renal failure, bladder cancer EXAM: RENAL / URINARY TRACT ULTRASOUND COMPLETE COMPARISON:  Ultrasound, 05/10/2022, CT abdomen pelvis, 11/22/2021 FINDINGS: Right Kidney: Renal measurements: 11.3 x 5.3 x 7.3 cm = volume: 230 mL. Echogenicity within normal limits. No mass or hydronephrosis visualized. Left Kidney: Renal measurements: 10.8 x 5.7 x 5.5 cm = volume: 175 mL. Echogenicity within normal limits. No mass or hydronephrosis visualized. Bladder: Absent. Other: None. IMPRESSION: 1. No acute abnormality.  No hydronephrosis. 2. The urinary bladder is surgically absent. Electronically Signed   By: Delanna Ahmadi M.D.   On: 09/06/2022 20:03   DG Chest Portable 1 View  Result Date: 09/06/2022 CLINICAL DATA:  Weakness EXAM: PORTABLE CHEST 1 VIEW COMPARISON:  08/26/2021 FINDINGS: The heart size and mediastinal contours are within normal limits. Both lungs are clear. The visualized skeletal structures are unremarkable. IMPRESSION: No acute abnormality of the lungs in AP portable projection. Electronically Signed   By: Delanna Ahmadi M.D.   On: 09/06/2022 17:50    Assessment/Plan  AKI  superimposed on CKD 3: 1.2-1.3 baseline per Jan 2023.  AKI on CKD likely d/t poor PO intake, concomitant use of benicar, hypovolemia.  Ileal conduit predisposes to hyperchloremic metabolic acidosis and so this is magnified with AKI and being volume down.  Making urine so hopefully can squeak by without dialysis which I discussed with pt and family.  - LR and NS given  - start bicarb gtt @ 150 mL/ hr x 24 hrs  - holding BP meds  - imaging- renal US and CT without obstruction  - urine culture pending  - clinda lower likelihoood of AIN  - no indication for dialysis now--if uremia does not improve with volume resuscitation may need to consider but hopeful for clinical improvement  - check CK  - q 6 BMP  2.  Hyperkalemia:  - expect to improve with bicarb gtt  3.  Metabolic acidosis:  - bicarb gtt as above  4.  DM II:  - per primary  5.  HTN:  - hold meds for now  6.  Bladder ca s/p cystectomy and ileal conduit  - no obstruction on imaging  7.  Dispo: to be admitted  Madelon Lips 09/06/2022, 9:18 PM

## 2022-09-06 NOTE — Assessment & Plan Note (Signed)
Follow with bicarb infusion

## 2022-09-06 NOTE — ED Notes (Signed)
Called lab to add on urine culture ?

## 2022-09-06 NOTE — Assessment & Plan Note (Addendum)
Holding ARB due to AKI -Amlodipine continued for 1 week at discharge for blood pressure control then okay to resume Benicar

## 2022-09-06 NOTE — ED Provider Notes (Signed)
[place] DEPT Provider Note   CSN: [id number] Arrival date & time: 09/06/22  1649     History  No chief complaint on file.   Adam Chung is a 71 y.o. male.  HPI Patient presents with mental status change.  Unsure how long its been going on for.  Patient little confused cannot provide a whole on history.  Reportedly still having good output of his urostomy.  Around 2 weeks ago had a tooth pulled.  On antibiotics starting about a week ago due to pain.  No fevers.  Has had previous ileal conduit for bladder cancer.  Has seen Dr. Tammi Klippel.  Seen by PCP and sent in for mental status change.   Past Medical History:  Diagnosis Date   Cancer [facility])    bladder   Diabetes mellitus without complication [facility])    Past Surgical History:  Procedure Laterality Date   BIOPSY  09/24/2021   Procedure: BIOPSY;  Surgeon: Wilford Corner, MD;  [facility];  Service: Endoscopy;;   BLADDER SURGERY     CYSTOSCOPY N/A 08/09/2021   Procedure: CYSTOSCOPY WITH INSERTION OF FOLEY CATHETER;  Surgeon: Janith Lima, MD;  Location: [facility];  Service: Urology;  Laterality: N/A;   ESOPHAGOGASTRODUODENOSCOPY (EGD) WITH PROPOFOL N/A 09/24/2021   Procedure: ESOPHAGOGASTRODUODENOSCOPY (EGD) WITH PROPOFOL;  Surgeon: Wilford Corner, MD;  [facility];  Service: Endoscopy;  Laterality: N/A;   fatty tumor excision     LYMPH NODE DISSECTION Bilateral 11/27/2021   Procedure: PELVIC LYMPH NODE DISSECTION;  Surgeon: Alexis Frock, MD;  [facility];  Service: Urology;  Laterality: Bilateral;   ROBOT ASSISTED LAPAROSCOPIC COMPLETE CYSTECT ILEAL CONDUIT N/A 11/27/2021   Procedure: XI ROBOTIC ASSISTED LAPAROSCOPIC COMPLETE CYSTECT ILEAL CONDUIT AND INDOCYANINE GREEN DYE INJECTION;  Surgeon: Alexis Frock, MD;  [facility];  Service: Urology;  Laterality: N/A;   ROBOT ASSISTED LAPAROSCOPIC RADICAL PROSTATECTOMY N/A 11/27/2021   Procedure: XI ROBOTIC  ASSISTED LAPAROSCOPIC RADICAL PROSTATECTOMY;  Surgeon: Alexis Frock, MD;  [facility];  Service: Urology;  Laterality: N/A;   TRANSURETHRAL RESECTION OF PROSTATE N/A 08/09/2021   Procedure: TRANSURETHRAL RESECTION OF THE PROSTATE (TURP);  Surgeon: Janith Lima, MD;  Location: [facility];  Service: Urology;  Laterality: N/A;     Home Medications Prior to Admission medications   Medication Sig Start Date End Date Taking? Authorizing Provider  acetaminophen (TYLENOL) 500 MG tablet Take 2 tablets (1,000 mg total) by mouth every 6 (six) hours as needed. 12/04/21 12/04/22  Bishop Limbo, MD  ciprofloxacin (CIPRO) 500 MG tablet Take 1 tablet (500 mg total) by mouth every 12 (twelve) hours. 11/22/21   Hendricks Limes, PA-C  Ensure Max Protein (ENSURE MAX PROTEIN) LIQD Take 330 mLs (11 oz total) by mouth 2 (two) times daily. Patient not taking: Reported on 11/18/2021 09/29/21   Eugenie Filler, MD  glimepiride (AMARYL) 4 MG tablet Take 4 mg by mouth daily with breakfast.    [provider]  insulin aspart (NOVOLOG) 100 UNIT/ML FlexPen Inject 4 Units into the skin 3 (three) times daily with meals. Give only if patient consumes > 50% of meals. Patient not taking: Reported on 10/15/2021 09/29/21   Eugenie Filler, MD  insulin detemir (LEVEMIR FLEXTOUCH) 100 UNIT/ML FlexPen Inject 15 Units into the skin 2 (two) times daily.    [provider]  insulin glargine-yfgn (SEMGLEE) 100 UNIT/ML injection Inject 0.1 mLs (10 Units total) into the skin daily. Patient not taking: Reported on  10/15/2021 09/30/21   Eugenie Filler, MD  Multiple Vitamin (MULTIVITAMIN WITH MINERALS) TABS tablet Take 1 tablet by mouth daily. Patient not taking: Reported on 11/18/2021 09/30/21   Eugenie Filler, MD  Nutritional Supplements (,FEEDING SUPPLEMENT, PROSOURCE PLUS) liquid Take 30 mLs by mouth 2 (two) times daily between meals. Patient not taking: Reported on 10/11/2021 09/30/21   Eugenie Filler, MD  ondansetron (ZOFRAN ODT) 8 MG disintegrating tablet Take 1 tablet (8 mg total) by mouth every 8 (eight) hours as needed for nausea or vomiting. Patient not taking: Reported on 11/18/2021 09/10/21   Kuppelweiser, Cassie L, RPH-CPP  oxyCODONE-acetaminophen (PERCOCET) 5-325 MG tablet Take 1 tablet by mouth every 6 (six) hours as needed for severe pain or moderate pain (post-operatively). 12/04/21 12/04/22  Alexis Frock, MD  senna-docusate (SENOKOT-S) 8.6-50 MG tablet Take 1 tablet by mouth 2 (two) times daily. Patient not taking: Reported on 10/11/2021 09/29/21   Eugenie Filler, MD      Allergies    Apple juice and Penicillins    Review of Systems   Review of Systems  Physical Exam Updated Vital Signs BP 109/65   Pulse 100   Temp 97.7 F (36.5 C)   Resp 15   Ht '5\' 11"'$  (1.803 m)   Wt 79.4 kg   SpO2 100%   BMI 24.41 kg/m  Physical Exam Vitals and nursing note reviewed.  HENT:     Head: Normocephalic.  Eyes:     Pupils: Pupils are equal, round, and reactive to light.  Cardiovascular:     Rate and Rhythm: Regular rhythm.  Abdominal:     Tenderness: There is no abdominal tenderness.     Comments: Ileoconduit right lower abdomen.  Musculoskeletal:        General: No tenderness.     Cervical back: Neck supple.  Skin:    General: Skin is warm.  Neurological:     Mental Status: He is alert.     Comments: Awake and pleasant but some confusion.     ED Results / Procedures / Treatments   Labs (all labs ordered are listed, but only abnormal results are displayed) Labs Reviewed  COMPREHENSIVE METABOLIC PANEL - Abnormal; Notable for the following components:      Result Value   Potassium 5.7 (*)    Chloride 123 (*)    CO2 <7 (*)    Glucose, Bld 225 (*)    BUN 126 (*)    Creatinine, Ser 5.34 (*)    AST 13 (*)    GFR, Estimated 11 (*)    All other components within normal limits  CBC WITH DIFFERENTIAL/PLATELET - Abnormal; Notable for the following components:    RBC 4.03 (*)    Hemoglobin 12.3 (*)    Abs Immature Granulocytes 0.19 (*)    All other components within normal limits  URINALYSIS, ROUTINE W REFLEX MICROSCOPIC - Abnormal; Notable for the following components:   APPearance TURBID (*)    Hgb urine dipstick SMALL (*)    Protein, ur >=300 (*)    Leukocytes,Ua LARGE (*)    Bacteria, UA RARE (*)    All other components within normal limits  CBG MONITORING, ED - Abnormal; Notable for the following components:   Glucose-Capillary 198 (*)    All other components within normal limits  URINE CULTURE  LACTIC ACID, PLASMA    EKG EKG Interpretation  Date/Time:  Monday September 06 2022 17:59:25 EDT Ventricular Rate:  101 PR Interval:  160  QRS Duration: 111 QT Interval:  357 QTC Calculation: 463 R Axis:   -48 Text Interpretation: Sinus tachycardia LAD, consider left anterior fascicular block T wave may be mildly peaked Confirmed by Davonna Belling [phone]) on 09/06/2022 7:26:51 PM  Radiology US Renal  Result Date: 09/06/2022 CLINICAL DATA:  Renal failure, bladder cancer EXAM: RENAL / URINARY TRACT ULTRASOUND COMPLETE COMPARISON:  Ultrasound, 05/10/2022, CT abdomen pelvis, 11/22/2021 FINDINGS: Right Kidney: Renal measurements: 11.3 x 5.3 x 7.3 cm = volume: 230 mL. Echogenicity within normal limits. No mass or hydronephrosis visualized. Left Kidney: Renal measurements: 10.8 x 5.7 x 5.5 cm = volume: 175 mL. Echogenicity within normal limits. No mass or hydronephrosis visualized. Bladder: Absent. Other: None. IMPRESSION: 1. No acute abnormality.  No hydronephrosis. 2. The urinary bladder is surgically absent. Electronically Signed   By: Delanna Ahmadi M.D.   On: 09/06/2022 20:03   DG Chest Portable 1 View  Result Date: 09/06/2022 CLINICAL DATA:  Weakness EXAM: PORTABLE CHEST 1 VIEW COMPARISON:  08/26/2021 FINDINGS: The heart size and mediastinal contours are within normal limits. Both lungs are clear. The visualized skeletal structures are  unremarkable. IMPRESSION: No acute abnormality of the lungs in AP portable projection. Electronically Signed   By: Delanna Ahmadi M.D.   On: 09/06/2022 17:50    Procedures Procedures    Medications Ordered in ED Medications  sodium bicarbonate 150 mEq in sterile water 1,150 mL infusion (has no administration in time range)  sodium chloride 0.9 % bolus 1,000 mL (1,000 mLs Intravenous New Bag/Given 09/06/22 1730)  lactated ringers bolus 1,000 mL (1,000 mLs Intravenous New Bag/Given 09/06/22 1954)    ED Course/ Medical Decision Making/ A&P                           Medical Decision Making Amount and/or Complexity of Data Reviewed Labs: ordered. Radiology: ordered.  Patient sent in for mental status change.  Reportedly been going for few days.  Cannot provide a whole lot history due to some confusion.  PCP sent in to rule out sepsis.  Has ileal conduit from previous bladder cancer.  No fever.  Initially tachycardic.  Has however new renal failure.  Creatinine up to 5.  Baseline appears to be 2.  Also bicarb less than 7.  Still has yellow urine coming out of the ileal conduit.  Reportedly has had good output but I am verifiable.  We will fluid resuscitate with lactated Ringer's.  Discussed with Dr. Hollie Salk from nephrology who will see patient.  Also discussed with Dr. Louis Meckel from urology.  Ultrasound and CT scan done do not show obstruction.  Urine does not necessarily show infection particular with the conduit.  Culture sent but will not treat at this time.  Will require admission to hospital.  I have reviewed previous notes and previous lab work.  CRITICAL CARE Performed by: Davonna Belling Total critical care time: 30 minutes Critical care time was exclusive of separately billable procedures and treating other patients. Critical care was necessary to treat or prevent imminent or life-threatening deterioration. Critical care was time spent personally by me on the following activities:  development of treatment plan with patient and/or surrogate as well as nursing, discussions with consultants, evaluation of patient's response to treatment, examination of patient, obtaining history from patient or surrogate, ordering and performing treatments and interventions, ordering and review of laboratory studies, ordering and review of radiographic studies, pulse oximetry and re-evaluation of patient's condition.  Final Clinical Impression(s) / ED Diagnoses Final diagnoses:  AKI (acute kidney injury) [facility])    Rx / [place] Orders ED Discharge Orders     None         Davonna Belling, MD 09/06/22 2106

## 2022-09-07 ENCOUNTER — Encounter (HOSPITAL_COMMUNITY): Payer: Self-pay | Admitting: Family Medicine

## 2022-09-07 LAB — BASIC METABOLIC PANEL
Anion gap: 5 (ref 5–15)
Anion gap: 7 (ref 5–15)
Anion gap: 6 (ref 5–15)
BUN: 124 mg/dL — ABNORMAL HIGH (ref 8–23)
BUN: 120 mg/dL — ABNORMAL HIGH (ref 8–23)
BUN: 115 mg/dL — ABNORMAL HIGH (ref 8–23)
CO2: 8 mmol/L — ABNORMAL LOW (ref 22–32)
CO2: 9 mmol/L — ABNORMAL LOW (ref 22–32)
CO2: 12 mmol/L — ABNORMAL LOW (ref 22–32)
Calcium: 9.7 mg/dL (ref 8.9–10.3)
Calcium: 9.4 mg/dL (ref 8.9–10.3)
Calcium: 9.6 mg/dL (ref 8.9–10.3)
Chloride: 127 mmol/L — ABNORMAL HIGH (ref 98–111)
Chloride: 126 mmol/L — ABNORMAL HIGH (ref 98–111)
Chloride: 124 mmol/L — ABNORMAL HIGH (ref 98–111)
Creatinine, Ser: 4.78 mg/dL — ABNORMAL HIGH (ref 0.61–1.24)
Creatinine, Ser: 4.53 mg/dL — ABNORMAL HIGH (ref 0.61–1.24)
Creatinine, Ser: 4.1 mg/dL — ABNORMAL HIGH (ref 0.61–1.24)
GFR, Estimated: 12 mL/min — ABNORMAL LOW (ref >60)
GFR, Estimated: 13 mL/min — ABNORMAL LOW (ref >60)
GFR, Estimated: 15 mL/min — ABNORMAL LOW (ref >60)
Glucose, Bld: 129 mg/dL — ABNORMAL HIGH (ref 70–99)
Glucose, Bld: 67 mg/dL — ABNORMAL LOW (ref 70–99)
Glucose, Bld: 77 mg/dL (ref 70–99)
Potassium: 5.1 mmol/L (ref 3.5–5.1)
Potassium: 4.2 mmol/L (ref 3.5–5.1)
Potassium: 3.9 mmol/L (ref 3.5–5.1)
Sodium: 140 mmol/L (ref 135–145)
Sodium: 142 mmol/L (ref 135–145)
Sodium: 142 mmol/L (ref 135–145)

## 2022-09-07 LAB — GLUCOSE, CAPILLARY
Glucose-Capillary: 55 mg/dL — ABNORMAL LOW (ref 70–99)
Glucose-Capillary: 58 mg/dL — ABNORMAL LOW (ref 70–99)
Glucose-Capillary: 96 mg/dL (ref 70–99)
Glucose-Capillary: 120 mg/dL — ABNORMAL HIGH (ref 70–99)
Glucose-Capillary: 185 mg/dL — ABNORMAL HIGH (ref 70–99)
Glucose-Capillary: 168 mg/dL — ABNORMAL HIGH (ref 70–99)
Glucose-Capillary: 182 mg/dL — ABNORMAL HIGH (ref 70–99)

## 2022-09-07 LAB — CK: Total CK: 47 U/L — ABNORMAL LOW (ref 49–397)

## 2022-09-07 LAB — HEMOGLOBIN A1C
Hgb A1c MFr Bld: 10.3 % — ABNORMAL HIGH (ref 4.8–5.6)
Mean Plasma Glucose: 248.91 mg/dL

## 2022-09-07 LAB — MRSA NEXT GEN BY PCR, NASAL: MRSA by PCR Next Gen: NOT DETECTED

## 2022-09-07 MED ORDER — CHLORHEXIDINE GLUCONATE CLOTH 2 % EX PADS
6.0000 | MEDICATED_PAD | Freq: Every day | CUTANEOUS | Status: DC
  Administered 2022-09-07 – 2022-09-09 (×3): 6 via TOPICAL

## 2022-09-07 MED ORDER — HYDRALAZINE HCL 25 MG PO TABS: 25.0000 mg | ORAL_TABLET | Freq: Three times a day (TID) | ORAL | Status: DC | PRN

## 2022-09-07 MED ORDER — LACTATED RINGERS IV BOLUS
1000.0000 mL | Freq: Once | INTRAVENOUS | Status: AC
  Administered 2022-09-07: 1000 mL via INTRAVENOUS

## 2022-09-07 MED ORDER — ORAL CARE MOUTH RINSE: 15.0000 mL | OROMUCOSAL | Status: DC | PRN

## 2022-09-07 MED ORDER — AMLODIPINE BESYLATE 5 MG PO TABS
5.0000 mg | ORAL_TABLET | Freq: Every day | ORAL | Status: DC
  Administered 2022-09-07 – 2022-09-09 (×3): 5 mg via ORAL
  Filled 2022-09-07 (×3): qty 1

## 2022-09-07 NOTE — Progress Notes (Signed)
[facility]                                                                               Adam Chung, is a 71 y.o. male, DOB - 1951/03/18, KKX:[id number] Admit date - 09/06/2022    Outpatient Primary MD for the patient is Adam Aus, MD  LOS - 1  days    Brief summary   Adam Chung is a 71 y.o. male with medical history significant of HTN, T2DM, CKD4, bladder cancer s/p cystoprostatectomy ileal conduit who presents with AMS. On exam this morning,    Assessment & Plan    Assessment and Plan: * Acute kidney injury superimposed on chronic kidney disease ([place]) -presenting creatinine of 5.34 from 1.39  -No obstructive pattern seen on CT A/P and renal U/S -pH of 7.07 with CO <7 and incalculable anion gap -Suspect  pre renal azotemia/ dehydration -Nephrology has evaluated and recommends keeping on IV bicarb infusion for 24 hours -avoid nephrotoxic agents.  -  Acute metabolic encephalopathy Secondary to uremia from acute on chronic CKD Patient is calm, answering simple questions and is trying to eat breakfast by himself.   Metabolic acidosis Slowly improving.   Bladder cancer [facility]) s/p cystoprostatectomy and ileal conduit  -no obstruction on CT A/P or renal ultrasound -recommend to follow up with Urology as outpatient.   Hyperkalemia Resolved with bicarb gtt.   Type 2 diabetes mellitus with stage 4 chronic kidney disease, with long-term current use of insulin (HCC)  CBG (last 3)  Recent Labs    09/07/22 0820 09/07/22 0853 09/07/22 1120  GLUCAP 58* 96 120*   Uncontrolled with hypoglycemia this am.  Will hold the levemir and continue with SSI.  Get hemoglobin A1c today.   Hypertension Not well controlled. Will start him on amlodipine.  Prn hydralazine.    Therapy evaluations have been ordered.      Estimated body mass index is 24.17 kg/m as calculated from the following:   Height as of this encounter: '5\' 11"'$  (1.803 m).    Weight as of this encounter: 78.6 kg.  Code Status: full code.  DVT Prophylaxis:  heparin injection 5,000 Units Start: 09/06/22 2230   Level of Care: Level of care: Stepdown Family Communication: NONE AT BEDSIDE.   Disposition Plan:     Remains inpatient appropriate:  IV sodium bicarb, AKI.   Procedures:  None.   Consultants:   Nephrology.   Antimicrobials:   Anti-infectives (From admission, onward)    None        Medications  Scheduled Meds:  Chlorhexidine Gluconate Cloth  6 each Topical Daily   heparin  5,000 Units Subcutaneous Q8H   insulin aspart  0-6 Units Subcutaneous TID WC   Continuous Infusions:  sodium bicarbonate 150 mEq in sterile water 1,150 mL infusion 150 mL/hr at 09/07/22 1400   PRN Meds:.acetaminophen, mouth rinse    Subjective:   Wrangler Penning was seen and examined today.  Confused.    Objective:   Vitals:   09/07/22 1100 09/07/22 1200 09/07/22 1300 09/07/22 1400  BP: (!) 158/60 (!) 150/62 (!) 151/68 (!) 176/143  Pulse: 79 81 81 88  Resp: Marland Kitchen)  0 '12 12 15  '$ Temp:      TempSrc:      SpO2: 100% 100% 100% 97%  Weight:      Height:        Intake/Output Summary (Last 24 hours) at 09/07/2022 1453 Last data filed at 09/07/2022 1400 Gross per 24 hour  Intake 2805.51 ml  Output 600 ml  Net 2205.51 ml   Filed Weights   09/06/22 1659 09/07/22 0437  Weight: 79.4 kg 78.6 kg     Exam General: Alert and oriented x 3, NAD Cardiovascular: S1 S2 auscultated, no murmurs, RRR Respiratory: Clear to auscultation bilaterally, no wheezing, rales or rhonchi Gastrointestinal: Soft, nontender, nondistended, + bowel sounds Ext: no pedal edema bilaterally Neuro:alert and answering questions appropriately.  Skin: No rashes Psych:mood is appropriate.    Data Reviewed:  I have personally reviewed following labs and imaging studies   CBC Lab Results  Component Value Date   WBC 10.4 09/06/2022   RBC 4.03 (L) 09/06/2022   HGB 12.3 (L)  09/06/2022   HCT 39.0 09/06/2022   MCV 96.8 09/06/2022   MCH 30.5 09/06/2022   PLT 278 09/06/2022   MCHC 31.5 09/06/2022   RDW 13.6 09/06/2022   LYMPHSABS 1.5 09/06/2022   MONOABS 0.9 09/06/2022   EOSABS 0.2 09/06/2022   BASOSABS 0.1 50/93/2671     Last metabolic panel Lab Results  Component Value Date   NA 142 09/07/2022   K 3.9 09/07/2022   CL 124 (H) 09/07/2022   CO2 12 (L) 09/07/2022   BUN 115 (H) 09/07/2022   CREATININE 4.10 (H) 09/07/2022   GLUCOSE 77 09/07/2022   GFRNONAA 15 (L) 09/07/2022   GFRAA >60 07/28/2018   CALCIUM 9.6 09/07/2022   PHOS 3.5 09/30/2021   PROT 7.7 09/06/2022   ALBUMIN 4.0 09/06/2022   BILITOT 0.7 09/06/2022   ALKPHOS 107 09/06/2022   AST 13 (L) 09/06/2022   ALT 11 09/06/2022   ANIONGAP 6 09/07/2022    CBG (last 3)  Recent Labs    09/07/22 0820 09/07/22 0853 09/07/22 1120  GLUCAP 58* 96 120*      Coagulation Profile: No results for input(s): "INR", "PROTIME" in the last 168 hours.   Radiology Studies: CT ABDOMEN PELVIS WO CONTRAST  Result Date: 09/06/2022 CLINICAL DATA:  Kidney failure, acute renal failure EXAM: CT ABDOMEN AND PELVIS WITHOUT CONTRAST TECHNIQUE: Multidetector CT imaging of the abdomen and pelvis was performed following the standard protocol without IV contrast. RADIATION DOSE REDUCTION: This exam was performed according to the departmental dose-optimization program which includes automated exposure control, adjustment of the mA and/or kV according to patient size and/or use of iterative reconstruction technique. COMPARISON:  CT abdomen pelvis 11/22/2021, MRI abdomen 10/07/2021 FINDINGS: Lower chest: Trace hiatal hernia.  No acute abnormality. Hepatobiliary: Vague 1.6 cm hypodensity within the right hepatic lobe (2:13) may be artifactual. Redemonstration of vague right hepatic dome 0.6 cm hypodensity. Status post cholecystectomy. No biliary dilatation. Pancreas: No focal lesion. Normal pancreatic contour. No surrounding  inflammatory changes. No main pancreatic ductal dilatation. Spleen: Normal in size without focal abnormality. Adrenals/Urinary Tract: No adrenal nodule bilaterally. No nephrolithiasis and no hydronephrosis. No definite contour-deforming renal mass. No ureterolithiasis or hydroureter. Status post radical prostatectomy with right ileal conduit formation. Stomach/Bowel: Stomach is within normal limits. No evidence of bowel wall thickening or dilatation. Colonic diverticulosis. The appendix is not definitely identified with no inflammatory changes in the right lower quadrant to suggest acute appendicitis. Vascular/Lymphatic: No abdominal aorta or iliac  aneurysm. Mild atherosclerotic plaque of the aorta and its branches. No abdominal, pelvic, or inguinal lymphadenopathy. Reproductive: Prostatectomy. Other: No intraperitoneal free fluid. No intraperitoneal free gas. No organized fluid collection. Musculoskeletal: No abdominal wall hernia or abnormality. Right gluteal musculature lipomatous lesion (2:67). No suspicious lytic or blastic osseous lesions. Stable densely sclerotic lesion within the left acetabula (2:73). Similar finding within the right iliac bone (2:56). No acute displaced fracture. Multilevel degenerative changes of the spine. IMPRESSION: 1. No acute intra-intrapelvic abnormality with limited evaluation on this noncontrast study in a patient status post radical prostatectomy with right ileal conduit formation. 2. Trace hiatal hernia. 3. Colonic diverticulosis with no acute diverticulitis. 4.  Aortic Atherosclerosis (ICD10-I70.0). Electronically Signed   By: Iven Finn M.D.   On: 09/06/2022 21:08   US Renal  Result Date: 09/06/2022 CLINICAL DATA:  Renal failure, bladder cancer EXAM: RENAL / URINARY TRACT ULTRASOUND COMPLETE COMPARISON:  Ultrasound, 05/10/2022, CT abdomen pelvis, 11/22/2021 FINDINGS: Right Kidney: Renal measurements: 11.3 x 5.3 x 7.3 cm = volume: 230 mL. Echogenicity within normal  limits. No mass or hydronephrosis visualized. Left Kidney: Renal measurements: 10.8 x 5.7 x 5.5 cm = volume: 175 mL. Echogenicity within normal limits. No mass or hydronephrosis visualized. Bladder: Absent. Other: None. IMPRESSION: 1. No acute abnormality.  No hydronephrosis. 2. The urinary bladder is surgically absent. Electronically Signed   By: Delanna Ahmadi M.D.   On: 09/06/2022 20:03   DG Chest Portable 1 View  Result Date: 09/06/2022 CLINICAL DATA:  Weakness EXAM: PORTABLE CHEST 1 VIEW COMPARISON:  08/26/2021 FINDINGS: The heart size and mediastinal contours are within normal limits. Both lungs are clear. The visualized skeletal structures are unremarkable. IMPRESSION: No acute abnormality of the lungs in AP portable projection. Electronically Signed   By: Delanna Ahmadi M.D.   On: 09/06/2022 17:50       Hosie Poisson M.D. [facility] 09/07/2022, 2:53 PM  Available via Epic secure chat 7am-7pm After 7 pm, please refer to night coverage provider listed on amion.

## 2022-09-07 NOTE — Progress Notes (Signed)
[facility] Progress Note  Subjective: seen in room, eating and drinking okay  Vitals:   09/07/22 1200 09/07/22 1300 09/07/22 1400 09/07/22 1500  BP: (!) 150/62 (!) 151/68 (!) 176/143 (!) 157/56  Pulse: 81 81 88   Resp: '12 12 15 14  '$ Temp:      TempSrc:      SpO2: 100% 100% 97%   Weight:      Height:        Exam:  alert, nad   no jvd  Chest cta bilat  Cor reg no RG  Abd soft ntnd no ascites, R ileal conduit with clear yellow urine with mucus , large amts clear yellow urine in the bag   Ext no LE edema   Alert, NF, ox3, no asterixis, better   Summary: 75M with a PMH sig for HTN,  DM II, bladder cancer s/p cystectomy with ileal conduit 10/2021 and recent tooth extraction who is now seen in consultation at the request of Dr Alvino Chapel for evaluation and recommendations for AKI on CKD.  Question having some N/V at home, and not eating well. Question taking ARB/ amlodipine BP medication as well. Became confused and PCP sent him to ED.   Assessment/ Plan:  AKI superimposed on CKD 3 - b/o creatinine 1.2-1.3 per Jan 2023.  AKI on CKD likely d/t poor PO intake, concomitant use of benicar, hypovolemia. Renal US no obstruction, UA unremarkable except prot > 300. Will get UP/C ratio.  Good UOP and creatinine improved slightly today to 4.1. no sx's of vol overload. Cont IVF"s, supportive care.  AMS - looking better today.  Hyperkalemia - improved with bicarb gtt Metabolic acidosis - Ileal conduit +AKI, cont bicarb gtt DM II - per primary HTN - holding ARB, norvasc started today Bladder ca s/p cystectomy w/ ileal conduit - no obstruction per CT  Rob Mariya Mottley 09/07/2022, 3:28 PM   Recent Labs  Lab 09/06/22 1718 09/06/22 2300 09/07/22 0330 09/07/22 0831  HGB 12.3*  --   --   --   ALBUMIN 4.0  --   --   --   CALCIUM 10.1   < > 9.4 9.6  CREATININE 5.34*   < > 4.53* 4.10*  K 5.7*   < > 4.2 3.9   < > = values in this interval not displayed.   No results for input(s):  "IRON", "TIBC", "FERRITIN" in the last 168 hours. Inpatient medications:  amLODipine  5 mg Oral Daily   Chlorhexidine Gluconate Cloth  6 each Topical Daily   heparin  5,000 Units Subcutaneous Q8H   insulin aspart  0-6 Units Subcutaneous TID WC    sodium bicarbonate 150 mEq in sterile water 1,150 mL infusion 150 mL/hr at 09/07/22 1400   acetaminophen, hydrALAZINE, mouth rinse

## 2022-09-08 DIAGNOSIS — N1832 Chronic kidney disease, stage 3b: Secondary | ICD-10-CM

## 2022-09-08 DIAGNOSIS — R531 Weakness: Secondary | ICD-10-CM

## 2022-09-08 LAB — BASIC METABOLIC PANEL
Anion gap: 5 (ref 5–15)
BUN: 90 mg/dL — ABNORMAL HIGH (ref 8–23)
CO2: 18 mmol/L — ABNORMAL LOW (ref 22–32)
Calcium: 8.6 mg/dL — ABNORMAL LOW (ref 8.9–10.3)
Chloride: 120 mmol/L — ABNORMAL HIGH (ref 98–111)
Creatinine, Ser: 3.25 mg/dL — ABNORMAL HIGH (ref 0.61–1.24)
GFR, Estimated: 20 mL/min — ABNORMAL LOW (ref >60)
Glucose, Bld: 121 mg/dL — ABNORMAL HIGH (ref 70–99)
Potassium: 3 mmol/L — ABNORMAL LOW (ref 3.5–5.1)
Sodium: 143 mmol/L (ref 135–145)

## 2022-09-08 LAB — PROTEIN / CREATININE RATIO, URINE
Creatinine, Urine: 73 mg/dL
Protein Creatinine Ratio: 0.44 mg/mg{Cre} — ABNORMAL HIGH (ref 0.00–0.15)
Total Protein, Urine: 32 mg/dL

## 2022-09-08 LAB — SODIUM, URINE, RANDOM: Sodium, Ur: 59 mmol/L

## 2022-09-08 LAB — GLUCOSE, CAPILLARY
Glucose-Capillary: 116 mg/dL — ABNORMAL HIGH (ref 70–99)
Glucose-Capillary: 132 mg/dL — ABNORMAL HIGH (ref 70–99)
Glucose-Capillary: 174 mg/dL — ABNORMAL HIGH (ref 70–99)
Glucose-Capillary: 194 mg/dL — ABNORMAL HIGH (ref 70–99)

## 2022-09-08 LAB — CREATININE, URINE, RANDOM: Creatinine, Urine: 73 mg/dL

## 2022-09-08 MED ORDER — STERILE WATER FOR INJECTION IV SOLN
INTRAVENOUS | Status: DC
  Filled 2022-09-08: qty 150
  Filled 2022-09-08 (×2): qty 1000

## 2022-09-08 NOTE — NC FL2 (Addendum)
[place] LEVEL OF CARE SCREENING TOOL     IDENTIFICATION  Patient Name: Adam Chung Birthdate: 04-28-1951 Sex: male Admission Date (Current Location): 09/06/2022  [facility] and [place] Number:  Herbalist and Address:  [facility],  [place] [place], [place]      Provider Number: [id number]  Attending Physician Name and Address:  Dwyane Dee, MD  Relative Name and Phone Number:  Daughter, Denny Levy [phone]    Current Level of Care: Hospital Recommended Level of Care: [place] Prior Approval Number:    Date Approved/Denied:   PASRR Number: [id number]  Discharge Plan: SNF    Current Diagnoses: Patient Active Problem List   Diagnosis Date Noted   Acute kidney injury superimposed on chronic kidney disease ([place]) 80/22/3361   Metabolic acidosis 22/44/9753   Acute metabolic encephalopathy 00/51/1021   Bladder cancer ([place]) 11/27/2021   Drug-induced liver injury 10/07/2021   GERD (gastroesophageal reflux disease) 10/06/2021   H. 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   Abnormal CT scan, stomach 09/24/2021   Malnutrition of moderate degree 09/24/2021   DKA (diabetic ketoacidosis) ([place]) 09/20/2021   Uncontrolled type 2 diabetes mellitus with hyperglycemia ([place]) 09/20/2021   Normocytic anemia 09/20/2021   Hyperkalemia 09/20/2021   Thrombocytosis 09/20/2021   Trigeminy 09/20/2021   Hyperphosphatemia 09/20/2021   Hypermagnesemia 09/20/2021   Pressure injury of skin 09/20/2021   Hypertension 08/07/2021   Type 2 diabetes mellitus with stage 4 chronic kidney disease, with long-term current use of insulin ([place]) 08/07/2021   AKI (acute kidney injury) ([place])  08/07/2021   Urinary retention 08/07/2021    Orientation RESPIRATION BLADDER Height & Weight     Self, Time, Situation, Place  Normal Incontinent, External catheter Weight: 173 lb 4.5 oz (78.6 kg) Height:  '5\' 11"'$  (180.3 cm)  BEHAVIORAL SYMPTOMS/MOOD NEUROLOGICAL BOWEL NUTRITION STATUS      Continent Diet (Regular)  AMBULATORY STATUS COMMUNICATION OF NEEDS Skin   Supervision Verbally Normal                       Personal Care Assistance Level of Assistance  Bathing, Feeding, Dressing Bathing Assistance: Limited assistance Feeding assistance: Independent Dressing Assistance: Limited assistance     Functional Limitations Info  Hearing, Speech, Sight Sight Info: Adequate Hearing Info: Adequate Speech Info: Adequate    SPECIAL CARE FACTORS FREQUENCY  PT (By licensed PT), OT (By licensed OT)     PT Frequency: 5x/wk OT Frequency: 5x/wk            Contractures Contractures Info: Not present    Additional Factors Info  Code Status, Allergies Code Status Info: FULL Allergies Info: Apple Juice, Penicillins           Current Medications (09/08/2022):  This is the current hospital active medication list Current Facility-Administered Medications  Medication Dose Route Frequency Provider Last Rate Last Admin   acetaminophen (TYLENOL) tablet 1,000 mg  1,000 mg Oral Q6H PRN Hosie Poisson, MD       amLODipine (NORVASC) tablet 5 mg  5 mg Oral Daily Roney Jaffe, MD   5 mg at 09/08/22 1001   Chlorhexidine Gluconate Cloth 2 % PADS 6 each  6 each Topical Daily Hosie Poisson,  MD   6 each at 09/08/22 0600   heparin injection 5,000 Units  5,000 Units Subcutaneous Q8H Hosie Poisson, MD   5,000 Units at 09/08/22 1521   hydrALAZINE (APRESOLINE) tablet 25 mg  25 mg Oral Q8H PRN Hosie Poisson, MD       insulin aspart (novoLOG) injection 0-6 Units  0-6 Units Subcutaneous TID WC Hosie Poisson, MD   1 Units at 09/07/22 1652   Oral care mouth rinse  15 mL Mouth Rinse PRN Hosie Poisson, MD       sodium bicarbonate 150 mEq in sterile water 1,150 mL infusion   Intravenous Continuous Roney Jaffe, MD 75 mL/hr at 09/08/22 1450 New Bag at 09/08/22 1450     Discharge Medications: Please see discharge summary for a list of discharge medications.  Relevant Imaging Results:  Relevant Lab Results:   Additional Information SS# [id number]  Vassie Moselle, LCSW

## 2022-09-08 NOTE — Progress Notes (Signed)
Progress Note    Adam Chung   OZY:[id number]  DOB: 18-Nov-1951  DOA: 09/06/2022     2 PCP: Adam Aus, MD  Initial CC: AMS  Hospital Course: Mr. Dec is a 71 yo male with PMH CKD3b, HTN, DMII, bladder cancer s/p cystoprostatectomy with ileal conduit who presented with altered mentation. On work-up, he was found to have acute on chronic renal failure.  BUN 126, creatinine 5.34. Imaging obtained with CT abdomen/pelvis and renal ultrasound.  No obstructions nor hydronephrosis appreciated.  Right ileal conduit formation was appreciated.  He was initiated on fluids and nephrology was consulted.  Interval History:  No events overnight.  Family present bedside this morning stating that patient's mentation has significantly improved.  He is resting comfortably in bed.  Assessment and Plan: * Acute renal failure superimposed on stage 3b chronic kidney disease ([place]) - patient has history of CKD3b. Baseline creat ~ 1.4 - 1.7, eGFR 40 - 45 - patient presented with increase in creat >0.3 mg/dL above baseline, creat increase >1.5x baseline presumed to have occurred within past 7 days PTA - creat 5.34 and BUN 126 on admission - had reported poor oral intake on admission -No obstruction noted on renal imaging - possible ATN from prolonged prerenal; also on ARB at home. No reports of severe hypoTN or hypoxia  - creat down to 3.25 today - repeat BMP in am  Acute metabolic encephalopathy - Secondary to uremia - now improved today   Weakness - Expected in setting of significant renal failure on admission but should improve quickly as renal function continues to recover -Patient did well with PT evaluation with no outpatient follow-up recommended -Evaluated by OT with recommendation for SNF.  He showed some confusion with pill counting however also not unexpected in setting of resolving uremia -If has assistance at home as cognition clears, suspect he would be okay for discharging  home  Bladder cancer [facility]) s/p cystoprostatectomy and ileal conduit  -no obstruction on CT A/P or renal ultrasound -follows urology outpt  Metabolic acidosis - Due to renal failure.  Improved with bicarb drip  Type 2 diabetes mellitus with stage 4 chronic kidney disease, with long-term current use of insulin ([place]) - Continue SSI and CBG monitoring  Hypertension Holding ARB due to AKI, BP is controlled  Hyperkalemia-resolved as of 09/08/2022 - Improved with treatment of AKI    Old records reviewed in assessment of this patient  Antimicrobials:   DVT prophylaxis:  heparin injection 5,000 Units Start: 09/06/22 2230   Code Status:   Code Status: Full Code  Mobility Assessment (last 72 hours)     Mobility Assessment     Row Name 09/08/22 1628 09/08/22 1158 09/08/22 1107 09/07/22 2015     Does patient have an order for bedrest or is patient medically unstable -- -- -- No - Continue assessment    What is the highest level of mobility based on the progressive mobility assessment? Level 5 (Walks with assist in room/hall) - Balance while stepping forward/back and can walk in room with assist - Complete Level 5 (Walks with assist in room/hall) - Balance while stepping forward/back and can walk in room with assist - Complete Level 5 (Walks with assist in room/hall) - Balance while stepping forward/back and can walk in room with assist - Complete Level 5 (Walks with assist in room/hall) - Balance while stepping forward/back and can walk in room with assist - Complete  Barriers to discharge:  Disposition Plan:  Home Status is: Inpt  Objective: Blood pressure (!) 144/78, pulse 88, temperature 98.1 F (36.7 C), temperature source Oral, resp. rate 18, height $RemoveBe'5\' 11"'BJRPZGIFH$  (1.803 m), weight 78.6 kg, SpO2 99 %.  Examination:  Physical Exam Constitutional:      General: He is not in acute distress.    Appearance: Normal appearance.  HENT:     Head: Normocephalic and  atraumatic.     Mouth/Throat:     Mouth: Mucous membranes are moist.  Eyes:     Extraocular Movements: Extraocular movements intact.  Cardiovascular:     Rate and Rhythm: Normal rate and regular rhythm.  Pulmonary:     Effort: Pulmonary effort is normal.     Breath sounds: Normal breath sounds.  Abdominal:     General: Bowel sounds are normal. There is no distension.     Tenderness: There is no abdominal tenderness.  Musculoskeletal:        General: Normal range of motion.     Cervical back: Normal range of motion.  Skin:    General: Skin is warm and dry.  Neurological:     General: No focal deficit present.     Mental Status: He is alert.     Comments: No asterixis  Psychiatric:        Mood and Affect: Mood normal.      Consultants:  Nephrology  Procedures:    Data Reviewed: Results for orders placed or performed during the hospital encounter of 09/06/22 (from the past 24 hour(s))  Glucose, capillary     Status: Abnormal   Collection Time: 09/07/22  9:29 PM  Result Value Ref Range   Glucose-Capillary 182 (H) 70 - 99 mg/dL  Basic metabolic panel     Status: Abnormal   Collection Time: 09/08/22  5:02 AM  Result Value Ref Range   Sodium 143 135 - 145 mmol/L   Potassium 3.0 (L) 3.5 - 5.1 mmol/L   Chloride 120 (H) 98 - 111 mmol/L   CO2 18 (L) 22 - 32 mmol/L   Glucose, Bld 121 (H) 70 - 99 mg/dL   BUN 90 (H) 8 - 23 mg/dL   Creatinine, Ser 3.25 (H) 0.61 - 1.24 mg/dL   Calcium 8.6 (L) 8.9 - 10.3 mg/dL   GFR, Estimated 20 (L) >60 mL/min   Anion gap 5 5 - 15  Glucose, capillary     Status: Abnormal   Collection Time: 09/08/22  7:31 AM  Result Value Ref Range   Glucose-Capillary 116 (H) 70 - 99 mg/dL  Glucose, capillary     Status: Abnormal   Collection Time: 09/08/22 11:39 AM  Result Value Ref Range   Glucose-Capillary 132 (H) 70 - 99 mg/dL  Protein / creatinine ratio, urine     Status: Abnormal   Collection Time: 09/08/22  3:28 PM  Result Value Ref Range    Creatinine, Urine 73 mg/dL   Total Protein, Urine 32 mg/dL   Protein Creatinine Ratio 0.44 (H) 0.00 - 0.15 mg/mg[Cre]  Sodium, urine, random     Status: None   Collection Time: 09/08/22  3:28 PM  Result Value Ref Range   Sodium, Ur 59 mmol/L  Creatinine, urine, random     Status: None   Collection Time: 09/08/22  3:28 PM  Result Value Ref Range   Creatinine, Urine 73 mg/dL  Glucose, capillary     Status: Abnormal   Collection Time: 09/08/22  5:07 PM  Result Value  Ref Range   Glucose-Capillary 174 (H) 70 - 99 mg/dL    I have Reviewed nursing notes, Vitals, and Lab results since pt's last encounter. Pertinent lab results : see above I have ordered test including BMP, CBC, Mg I have reviewed the last note from staff over past 24 hours I have discussed pt's care plan and test results with nursing staff, case manager   LOS: 2 days   Dwyane Dee, MD [facility] 09/08/2022, 6:52 PM

## 2022-09-08 NOTE — TOC Initial Note (Addendum)
Transition of Care [facility]) - Initial/Assessment Note    Patient Details  Name: Adam Chung MRN: [id number] Date of Birth: 1951-02-24  Transition of Care [facility]) CM/SW Contact:    Vassie Moselle, LCSW Phone Number: 09/08/2022, 3:20 PM  Clinical Narrative:                 Met with pt and brother at bedside and confirmed plan for SNF placement. Pt reports he has been to SNF in the past but, is unsure of the name of facility he went to. Pt states he did not have a good experience and does not want to return to the same facility. Per chart review pt has received SNF placement at [facility] in 09/2021. Pt shares that his top choice for SNF placement is [facility].  CSW referred pt for SNF placement and currently awaiting bed offers.  Update 3:30pm: Per PT no PT f/u recommended. CSW left voicemail with pt's daughter to discuss level of care/support he has at home as OT recommend pt having 24/7 caregiver support.   Expected Discharge Plan: Skilled Nursing Facility Barriers to Discharge: No Barriers Identified   Patient Goals and CMS Choice Patient states their goals for this hospitalization and ongoing recovery are:: To return home CMS Medicare.gov Compare Post Acute Care list provided to:: Patient Choice offered to / list presented to : Patient  Expected Discharge Plan and Services Expected Discharge Plan: [place] In-house Referral: NA Discharge Planning Services: CM Consult Post Acute Care Choice: [place] Living arrangements for the past 2 months: Single Family Home                 DME Arranged: N/A DME Agency: NA                  Prior Living Arrangements/Services Living arrangements for the past 2 months: Single Family Home Lives with:: Self Patient language and need for interpreter reviewed:: Yes Do you feel safe going back to the place where you live?: Yes      Need for Family Participation in Patient Care: No (Comment) Care giver  support system in place?: No (comment) Current home services: DME Criminal Activity/Legal Involvement Pertinent to Current Situation/Hospitalization: No - Comment as needed  Activities of Daily Living Home Assistive Devices/Equipment: None ADL Screening (condition at time of admission) Patient's cognitive ability adequate to safely complete daily activities?: Yes Is the patient deaf or have difficulty hearing?: No Does the patient have difficulty seeing, even when wearing glasses/contacts?: No Does the patient have difficulty concentrating, remembering, or making decisions?: Yes Patient able to express need for assistance with ADLs?: Yes Does the patient have difficulty dressing or bathing?: No Independently performs ADLs?: Yes (appropriate for developmental age) Does the patient have difficulty walking or climbing stairs?: No Weakness of Legs: Both Weakness of Arms/Hands: None  Permission Sought/Granted Permission sought to share information with : Facility Sport and exercise psychologist, Family Supports Permission granted to share information with : Yes, Verbal Permission Granted  Share Information with NAME: Denny Levy     Permission granted to share info w Relationship: Daughter  Permission granted to share info w Contact Information: [phone]  Emotional Assessment Appearance:: Appears stated age Attitude/Demeanor/Rapport: Engaged Affect (typically observed): Accepting Orientation: : Oriented to Self, Oriented to Place, Oriented to  Time, Oriented to Situation Alcohol / Substance Use: Not Applicable Psych Involvement: No (comment)  Admission diagnosis:  AKI (acute kidney injury) ([place]) [N17.9] Acute kidney injury superimposed on chronic kidney  disease ([place]) [N17.9, O96.2] Acute metabolic encephalopathy [X52.84] Patient Active Problem List   Diagnosis Date Noted   Acute kidney injury superimposed on chronic kidney disease ([place]) 13/24/4010   Metabolic acidosis 27/25/3664    Acute metabolic encephalopathy 40/34/7425   Bladder cancer ([place]) 11/27/2021   Drug-induced liver injury 10/07/2021   GERD (gastroesophageal reflux disease) 10/06/2021   H. 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   Abnormal CT scan, stomach 09/24/2021   Malnutrition of moderate degree 09/24/2021   DKA (diabetic ketoacidosis) ([place]) 09/20/2021   Uncontrolled type 2 diabetes mellitus with hyperglycemia ([place]) 09/20/2021   Normocytic anemia 09/20/2021   Hyperkalemia 09/20/2021   Thrombocytosis 09/20/2021   Trigeminy 09/20/2021   Hyperphosphatemia 09/20/2021   Hypermagnesemia 09/20/2021   Pressure injury of skin 09/20/2021   Hypertension 08/07/2021   Type 2 diabetes mellitus with stage 4 chronic kidney disease, with long-term current use of insulin ([place]) 08/07/2021   AKI (acute kidney injury) ([place]) 08/07/2021   Urinary retention 08/07/2021   PCP:  Rusty Aus, MD Pharmacy:   [facility] [address], [place] - [place] [address] Phone: [phone] Fax: [place] [address] Phone: [phone] Fax: [phone]     Social Determinants of Health (SDOH) Interventions    Readmission Risk Interventions    09/08/2022    3:18 PM  Readmission Risk Prevention Plan  Transportation Screening Complete  PCP or Specialist Appt within 3-5 Days Complete  HRI or [place] Complete  Social Work Consult for [place] Planning/Counseling Complete  Palliative Care Screening Not Applicable  Medication Review Press photographer) Complete

## 2022-09-08 NOTE — Evaluation (Signed)
Occupational Therapy Evaluation Patient Details Name: Adam Chung MRN: [id number] DOB: 19-Jan-1951 Today's Date: 09/08/2022   History of Present Illness Patient is a 71 year old male who presented to the hospital with AMS. patient was found to have acute kidney injury superimposed on chronic kidney disease and acute metabolic encephalopathy, metabolic acidosis. PMH: HTN, DMII, CKD II, bladder CA   Clinical Impression   Patient is a 71 year old male who was admitted for above. Patient was living at home alone prior level. Patient was noted to have increased confusion  during session today with some contradictory statements made but cooperative and pleasant with all tasks.patient would need 24/7 caregiver support for safety and medication management to be successful in the next level of care. Unclear amount of support available at this time.  Patient was noted to have decreased functional activity tolerance, decreased endurance, decreased standing balance, decreased safety awareness, and decreased knowledge of AD/AE impacting participation in ADLs. Patient would continue to benefit from skilled OT services at this time while admitted and after d/c to address noted deficits in order to improve overall safety and independence in ADLs.       Recommendations for follow up therapy are one component of a multi-disciplinary discharge planning process, led by the attending physician.  Recommendations may be updated based on patient status, additional functional criteria and insurance authorization.   Follow Up Recommendations  Skilled nursing-short term rehab (<3 hours/day) pending family support at home   Assistance Recommended at Discharge Frequent or constant Supervision/Assistance  Patient can return home with the following A little help with walking and/or transfers;A little help with bathing/dressing/bathroom;Assistance with cooking/housework;Direct supervision/assist for financial  management;Assist for transportation;Direct supervision/assist for medications management;Help with stairs or ramp for entrance    Functional Status Assessment  Patient has had a recent decline in their functional status and demonstrates the ability to make significant improvements in function in a reasonable and predictable amount of time.  Equipment Recommendations  None recommended by OT    Recommendations for Other Services       Precautions / Restrictions Precautions Precaution Comments: ostomy on R side Restrictions Weight Bearing Restrictions: No      Mobility Bed Mobility Overal bed mobility: Needs Assistance Bed Mobility: Supine to Sit     Supine to sit: Min guard, HOB elevated          Transfers                          Balance Overall balance assessment: Mild deficits observed, not formally tested                                         ADL either performed or assessed with clinical judgement   ADL Overall ADL's : Needs assistance/impaired Eating/Feeding: Set up;Sitting   Grooming: Standing;Min guard;Wash/dry face;Oral care Grooming Details (indicate cue type and reason): standing at sink with some unsteadiness noted at times but no LOB Upper Body Bathing: Set up;Sitting   Lower Body Bathing: Minimal assistance;Sit to/from stand;Sitting/lateral leans   Upper Body Dressing : Set up;Sitting   Lower Body Dressing: Minimal assistance;Sit to/from stand;Sitting/lateral leans   Toilet Transfer: Minimal assistance;Ambulation;Regular Glass blower/designer Details (indicate cue type and reason): with no AD noted to be unsteady and reach out for objects in room to hold onto Toileting- Clothing Manipulation and  Hygiene: Minimal assistance;Sit to/from stand               Vision Patient Visual Report: No change from baseline       Perception     Praxis      Pertinent Vitals/Pain Pain Assessment Pain Assessment:  No/denies pain     Hand Dominance     Extremity/Trunk Assessment Upper Extremity Assessment Upper Extremity Assessment: Overall WFL for tasks assessed   Lower Extremity Assessment Lower Extremity Assessment: Defer to PT evaluation   Cervical / Trunk Assessment Cervical / Trunk Assessment: Normal   Communication Communication Communication: No difficulties   Cognition Arousal/Alertness: Awake/alert Behavior During Therapy: WFL for tasks assessed/performed Overall Cognitive Status: Within Functional Limits for tasks assessed                                 General Comments: plesantly confused. patient knew he was int he hospital but did not think it was [place] [facility]. patient was cooperative and able to follow commands but noted to have confusion during speech.     General Comments       Exercises     Shoulder Instructions      Home Living Family/patient expects to be discharged to:: Private residence Living Arrangements: Alone Available Help at Discharge: Friend(s);Available 24 hours/day Type of Home: House Home Access: Stairs to enter [employer] of Steps: 2 Entrance Stairs-Rails: None Home Layout: One level     Bathroom Shower/Tub: Tub/shower unit         Home Equipment: Conservation officer, nature (2 wheels)          Prior Functioning/Environment Prior Level of Function : Independent/Modified Independent                        OT Problem List: Decreased activity tolerance;Decreased safety awareness;Impaired balance (sitting and/or standing);Decreased knowledge of precautions;Decreased knowledge of use of DME or AE      OT Treatment/Interventions: Self-care/ADL training;Therapeutic exercise;Neuromuscular education;Energy conservation;Therapeutic activities;Balance training;Patient/family education;DME and/or AE instruction    OT Goals(Current goals can be found in the care plan section) Acute Rehab OT Goals Patient Stated  Goal: to get better OT Goal Formulation: Patient unable to participate in goal setting Time For Goal Achievement: 09/22/22 Potential to Achieve Goals: Fair  OT Frequency: Min 2X/week    Co-evaluation              AM-PAC OT "6 Clicks" Daily Activity     Outcome Measure Help from another person eating meals?: A Little Help from another person taking care of personal grooming?: A Little Help from another person toileting, which includes using toliet, bedpan, or urinal?: A Lot Help from another person bathing (including washing, rinsing, drying)?: A Lot Help from another person to put on and taking off regular upper body clothing?: A Little Help from another person to put on and taking off regular lower body clothing?: A Lot 6 Click Score: 15   End of Session Equipment Utilized During Treatment: Gait belt Nurse Communication: Mobility status  Activity Tolerance: Patient tolerated treatment well Patient left: in chair;with call bell/phone within reach;with chair alarm set  OT Visit Diagnosis: Unsteadiness on feet (R26.81);Other abnormalities of gait and mobility (R26.89);Muscle weakness (generalized) (M62.81)                Time: 6144-3154 OT Time Calculation (min): 22 min Charges:  OT General Charges $OT  Visit: 1 Visit OT Evaluation $OT Eval Moderate Complexity: 1 Mod  Kissie Ziolkowski OTR/L, MS Acute Rehabilitation Department Office# [phone]   Marcellina Millin 09/08/2022, 11:10 AM

## 2022-09-08 NOTE — Assessment & Plan Note (Addendum)
-   Expected in setting of significant renal failure on admission but should improve quickly as renal function continues to recover -Patient did well with PT evaluation with no outpatient follow-up recommended -Evaluated by OT with recommendation for SNF.  He showed some confusion with pill counting on 10/11 however also not unexpected in setting of resolving uremia; family also able to provide support at home on discharge (med organization, help with meals, etc)

## 2022-09-08 NOTE — Progress Notes (Signed)
[facility] Progress Note  Subjective: seen in room, eating and drinking okay. 2.1 L UOP yest and 1.6 today so far. Creat down 3.2. BUN 80s.   Vitals:   09/07/22 1836 09/07/22 2126 09/08/22 0246 09/08/22 0646  BP: 132/68 (!) 142/66 125/65 139/73  Pulse: 82 81 86 83  Resp: '18 16 16 16  '$ Temp: 98.7 F (37.1 C) 98.4 F (36.9 C) 98.6 F (37 C) 98.2 F (36.8 C)  TempSrc: Oral Oral Oral Oral  SpO2: 100% 99% 99% 98%  Weight:      Height:        Exam:  alert, nad   no jvd  Chest cta bilat  Cor reg no RG  Abd soft ntnd no ascites, R ileal conduit with clear yellow urine with mucus , large amts clear yellow urine in the bag   Ext no LE edema   Alert, NF, ox3, no asterixis, better   Summary: 71M with a PMH sig for HTN,  DM II, bladder cancer s/p cystectomy with ileal conduit 10/2021. Pt sp recent tooth extraction and question having some N/V at home and not eating well. Question taking ARB/ amlodipine BP medication as well. Became confused and PCP sent him to ED.      Na 143  K+ 3.0  CO2 18^  BUN 90  Creat 3.25  K 3.0   Assessment/ Plan:  AKI superimposed on CKD 3 - b/l creatinine 1.2-1.3 per Jan 2023.  AKI on CKD likely d/t poor PO intake, concomitant use of benicar, hypovolemia. Renal US no obstruction, UA unremarkable except prot > 300. Will get UP/C ratio.  Good UOP. Creat down 4.1 > 3.2 today, sig improvement. Cont IVF"s another 24 hrs. F/u labs in am.  AMS - mental status continues to improve Hyperkalemia - resolved with bicarb gtt Metabolic acidosis - Ileal conduit +AKI, serum CO2 up to 18. Cont IVF"s  DM II - per primary HTN - holding ARB, norvasc started here. BP's good Bladder ca s/p cystectomy w/ ileal conduit - no obstruction per CT  Rob Lindwood Mogel 09/08/2022, 12:13 PM   Recent Labs  Lab 09/06/22 1718 09/06/22 2300 09/07/22 0831 09/08/22 0502  HGB 12.3*  --   --   --   ALBUMIN 4.0  --   --   --   CALCIUM 10.1   < > 9.6 8.6*  CREATININE 5.34*   < >  4.10* 3.25*  K 5.7*   < > 3.9 3.0*   < > = values in this interval not displayed.   No results for input(s): "IRON", "TIBC", "FERRITIN" in the last 168 hours. Inpatient medications:  amLODipine  5 mg Oral Daily   Chlorhexidine Gluconate Cloth  6 each Topical Daily   heparin  5,000 Units Subcutaneous Q8H   insulin aspart  0-6 Units Subcutaneous TID WC     acetaminophen, hydrALAZINE, mouth rinse

## 2022-09-08 NOTE — Evaluation (Signed)
Physical Therapy Evaluation Patient Details Name: Adam Chung MRN: [id number] DOB: Jun 08, 1951 Today's Date: 09/08/2022  History of Present Illness  Patient is a 71 year old male who presented to the hospital with AMS. patient was found to have acute kidney injury superimposed on chronic kidney disease and acute metabolic encephalopathy, metabolic acidosis. PMH: HTN, DMII, CKD II, bladder CA.    Clinical Impression  Adam Chung is 71 y.o. male admitted with above HPI and diagnosis. Patient is currently limited by functional impairments below (see PT problem list). Patient lives alone and is independent at baseline. Currently pt remains slightly disoriented and has slightly reduced awareness of deficits. He required supervision/guarding for safety with transfers and gait. Patient will benefit from continued skilled PT interventions to address impairments and progress independence with mobility, anticipate pt will improve as mentation/cognition continues to improve and no follow up PT will be needed. Acute PT will follow and progress as able.        Recommendations for follow up therapy are one component of a multi-disciplinary discharge planning process, led by the attending physician.  Recommendations may be updated based on patient status, additional functional criteria and insurance authorization.  Follow Up Recommendations No PT follow up      Assistance Recommended at Discharge Intermittent Supervision/Assistance  Patient can return home with the following  A little help with walking and/or transfers;A little help with bathing/dressing/bathroom;Assist for transportation;Help with stairs or ramp for entrance;Direct supervision/assist for medications management;Assistance with cooking/housework    Equipment Recommendations None recommended by PT  Recommendations for Other Services       Functional Status Assessment Patient has had a recent decline in their functional status  and demonstrates the ability to make significant improvements in function in a reasonable and predictable amount of time.     Precautions / Restrictions Precautions Precaution Comments: ostomy on R side Restrictions Weight Bearing Restrictions: No      Mobility  Bed Mobility Overal bed mobility: Needs Assistance Bed Mobility: Supine to Sit, Sit to Supine     Supine to sit: Supervision Sit to supine: Supervision   General bed mobility comments: HOB slightly elevated, pt taking extra time.    Transfers Overall transfer level: Needs assistance Equipment used: Rolling walker (2 wheels) Transfers: Sit to/from Stand Sit to Stand: Supervision           General transfer comment: cues for hand placement with RW, supervision to rise from EOB and control lowering.    Ambulation/Gait Ambulation/Gait assistance: Supervision, Min guard Gait Distance (Feet): 400 Feet Assistive device: Rolling walker (2 wheels), None Gait Pattern/deviations: Step-through pattern, Decreased stride length Gait velocity: fair     General Gait Details: overall steady gait witH RW, no staggering, drifting or LOB. Pt ambualted portion with no device and balance remained smooth with no LOB observed.  Stairs            Wheelchair Mobility    Modified Rankin (Stroke Patients Only)       Balance Overall balance assessment: No apparent balance deficits (not formally assessed)                                           Pertinent Vitals/Pain Pain Assessment Pain Assessment: No/denies pain    Home Living Family/patient expects to be discharged to:: Private residence Living Arrangements: Alone Available Help at Discharge: Friend(s);Available 24 hours/day  Type of Home: House Home Access: Stairs to enter Entrance Stairs-Rails: None Entrance Stairs-Number of Steps: 1+1   Home Layout: One level Home Equipment: Conservation officer, nature (2 wheels)      Prior Function Prior Level of  Function : Independent/Modified Independent                     Hand Dominance        Extremity/Trunk Assessment   Upper Extremity Assessment Upper Extremity Assessment: Overall WFL for tasks assessed;Defer to OT evaluation    Lower Extremity Assessment Lower Extremity Assessment: Overall WFL for tasks assessed    Cervical / Trunk Assessment Cervical / Trunk Assessment: Normal  Communication   Communication: No difficulties  Cognition Arousal/Alertness: Awake/alert Behavior During Therapy: WFL for tasks assessed/performed Overall Cognitive Status: Within Functional Limits for tasks assessed                                 General Comments: pt oriented to self, and place with cues. pt states 1923 and september initially, oritend to date/time and situation. Pt followed commands with extra time and pleasant. pt's sense of humor intact.        General Comments      Exercises     Assessment/Plan    PT Assessment Patient needs continued PT services  PT Problem List Decreased activity tolerance;Decreased balance;Decreased mobility;Decreased cognition;Decreased knowledge of use of DME;Decreased safety awareness;Decreased knowledge of precautions       PT Treatment Interventions DME instruction;Gait training;Stair training;Functional mobility training;Therapeutic activities;Therapeutic exercise;Balance training;Cognitive remediation;Patient/family education    PT Goals (Current goals can be found in the Care Plan section)  Acute Rehab PT Goals Patient Stated Goal: get home PT Goal Formulation: With patient/family Time For Goal Achievement: 09/22/22 Potential to Achieve Goals: Good    Frequency Min 3X/week     Co-evaluation               AM-PAC PT "6 Clicks" Mobility  Outcome Measure Help needed turning from your back to your side while in a flat bed without using bedrails?: None Help needed moving from lying on your back to sitting on  the side of a flat bed without using bedrails?: None Help needed moving to and from a bed to a chair (including a wheelchair)?: A Little Help needed standing up from a chair using your arms (e.g., wheelchair or bedside chair)?: A Little Help needed to walk in hospital room?: A Little Help needed climbing 3-5 steps with a railing? : A Little 6 Click Score: 20    End of Session Equipment Utilized During Treatment: Gait belt Activity Tolerance: Patient tolerated treatment well Patient left: in bed;with call bell/phone within reach;with bed alarm set;with family/visitor present (chair position) Nurse Communication: Mobility status PT Visit Diagnosis: Difficulty in walking, not elsewhere classified (R26.2)    Time: 7510-2585 PT Time Calculation (min) (ACUTE ONLY): 21 min   Charges:   PT Evaluation $PT Eval Low Complexity: 1 Low          Verner Mould, DPT Acute Rehabilitation Services Office [phone]  09/08/22 3:25 PM

## 2022-09-08 NOTE — Hospital Course (Addendum)
Adam Chung is a 71 yo male with PMH CKD3b, HTN, DMII, bladder cancer s/p cystoprostatectomy with ileal conduit who presented with altered mentation. On work-up, he was found to have acute on chronic renal failure.  BUN 126, creatinine 5.34. Imaging obtained with CT abdomen/pelvis and renal ultrasound.  No obstructions nor hydronephrosis appreciated.  Right ileal conduit formation was appreciated.  He was initiated on fluids and nephrology was consulted. Renal function slowly improved with fluids and trending.

## 2022-09-08 NOTE — Progress Notes (Signed)
Initial Nutrition Assessment  DOCUMENTATION CODES:   Non-severe (moderate) malnutrition in context of chronic illness  INTERVENTION:   -Liberalized diet to regular to help improve PO intakes  -Encouraged PO intakes  NUTRITION DIAGNOSIS:   Moderate Malnutrition related to chronic illness (CKD, h/o bladder cancer) as evidenced by moderate fat depletion, severe muscle depletion.  GOAL:   Patient will meet greater than or equal to 90% of their needs  MONITOR:   PO intake, Weight trends, Labs, I & O's  REASON FOR ASSESSMENT:   Consult Assessment of nutrition requirement/status  ASSESSMENT:   71 y.o. male with medical history significant of HTN, T2DM, CKD4, bladder cancer s/p cystoprostatectomy ileal conduit who presents with AMS. Admitted with AKI superimposed on CKD3.  Patient in room, states he ate 100% of his breakfast but there "wasn't much to it". He consumed 1 piece of bacon, 1 slice of toast, and grape juice.  Pt was not eating much PTA d/t recent tooth extractions and he was having dental pain prior to that. Pt was eating soft foods and soups mainly. Normal intake is usually ~2 meals a day, consisting of fish sandwiches, gravy biscuits and various meat dishes.  Pt takes Vitamin D and B12 at home.  To encourage better intakes, will liberalize diet to regular. Renal diet not indicated with AKI. Offered protein supplements but pt declines. States he would rather focus on protein foods.   Per weight records, pt's weight has increased since December 2022. Pt was 177 lbs at one point.   Medication reviewed.   Labs reviewed:  CBGs: 58-185  Low K  NUTRITION - FOCUSED PHYSICAL EXAM:  Flowsheet Row Most Recent Value  Orbital Region Moderate depletion  Upper Arm Region Moderate depletion  Thoracic and Lumbar Region Moderate depletion  Buccal Region Moderate depletion  Temple Region Moderate depletion  Clavicle Bone Region Severe depletion  Clavicle and Acromion Bone  Region Moderate depletion  Scapular Bone Region Moderate depletion  Dorsal Hand Mild depletion  Patellar Region Severe depletion  Anterior Thigh Region Severe depletion  Posterior Calf Region Moderate depletion  Edema (RD Assessment) None  Hair Reviewed  Eyes Reviewed  Mouth Reviewed  Skin Reviewed       Diet Order:   Diet Order             Diet regular Room service appropriate? Yes; Fluid consistency: Thin  Diet effective now                   EDUCATION NEEDS:   No education needs have been identified at this time  Skin:  Skin Assessment: Reviewed RN Assessment  Last BM:  PTA  Height:   Ht Readings from Last 1 Encounters:  09/06/22 '5\' 11"'$  (1.803 m)    Weight:   Wt Readings from Last 1 Encounters:  09/07/22 78.6 kg    BMI:  Body mass index is 24.17 kg/m.  Estimated Nutritional Needs:   Kcal:  2000-2200  Protein:  95-105g  Fluid:  2L/day  Adam Bibles, MS, RD, LDN Inpatient Clinical Dietitian Contact information available via Amion

## 2022-09-08 NOTE — Progress Notes (Addendum)
Occupational Therapy Treatment Patient Details Name: Adam Chung MRN: [id number] DOB: 02/24/1951 Today's Date: 09/08/2022   History of present illness Patient is a 71 year old male who presented to the hospital with AMS. patient was found to have acute kidney injury superimposed on chronic kidney disease and acute metabolic encephalopathy, metabolic acidosis. PMH: HTN, DMII, CKD II, bladder CA   OT comments  Patient was seen this afternoon to further assess cognitive status. Patient lives at home alone with continued confusion noted during this session though patient was noted to remember date and location better than AM session. Patient was unable to complete Medi Cog pillbox test with patient totaling pill count on "Saturday" was "11". When correct answer was "5.5". see attached picture below patient reported only taking one pill at home normally. Patient was educated on importance of medication management, how medications can change after hospitalization, and benefits of having help with medication management after hospitalization. Patient verbalized understanding. Patient's discharge plan remains appropriate at this time. OT will continue to follow acutely.     Recommendations for follow up therapy are one component of a multi-disciplinary discharge planning process, led by the attending physician.  Recommendations may be updated based on patient status, additional functional criteria and insurance authorization.    Follow Up Recommendations  Skilled nursing-short term rehab (<3 hours/day) (if 24/7 caregiver support not available)    Assistance Recommended at Discharge Frequent or constant Supervision/Assistance  Patient can return home with the following  A little help with walking and/or transfers;A little help with bathing/dressing/bathroom;Assistance with cooking/housework;Direct supervision/assist for financial management;Assist for transportation;Direct supervision/assist for  medications management;Help with stairs or ramp for entrance   Equipment Recommendations  None recommended by OT    Recommendations for Other Services      Precautions / Restrictions Precautions Precaution Comments: ostomy on R side Restrictions Weight Bearing Restrictions: No       Mobility Bed Mobility Overal bed mobility: Needs Assistance Bed Mobility: Supine to Sit     Supine to sit: Min guard, HOB elevated          Transfers                         Balance Overall balance assessment: Mild deficits observed, not formally tested                 ADL either performed or assessed with clinical judgement   ADL Overall ADL's : Needs assistance/impaired                General ADL Comments: patient was noted to be alittle more clear this afternoon compared to AM session with patient aware of date and location this afternoon. Patient participated in [place] with patient unable to complete test. patient was noted to write "9" in the noon row after reading prompt 2 which states" take one tablet 3 times daily with meals". then  patient after education on importance of breaking up pills, patient was unable to correct it. patient was noted to become frustrated with participation in task and it was eneded at this time.patient's total pills for saturday would be 11 when the correct answer is 5.5. patient will need help with pill box management at home in next level of care.  patient also participated in [place] 27/30.    Extremity/Trunk Assessment              Vision  Perception     Praxis      Cognition Arousal/Alertness: Awake/alert Behavior During Therapy: WFL for tasks assessed/performed Overall Cognitive Status: Within Functional Limits for tasks assessed                                 General Comments: plesantly confused. patient knew he was int he hospital but did not think it was  [facility]. patient was cooperative and able to follow commands but noted to have confusion during speech.        Exercises      Shoulder Instructions       General Comments      Pertinent Vitals/ Pain       Pain Assessment Pain Assessment: No/denies pain  Home Living                                          Prior Functioning/Environment              Frequency  Min 2X/week        Progress Toward Goals  OT Goals(current goals can now be found in the care plan section)  Progress towards OT goals: Progressing toward goals     Plan Discharge plan remains appropriate    Co-evaluation                 AM-PAC OT "6 Clicks" Daily Activity     Outcome Measure   Help from another person eating meals?: A Little Help from another person taking care of personal grooming?: A Little Help from another person toileting, which includes using toliet, bedpan, or urinal?: A Little Help from another person bathing (including washing, rinsing, drying)?: A Lot Help from another person to put on and taking off regular upper body clothing?: A Little Help from another person to put on and taking off regular lower body clothing?: A Lot 6 Click Score: 16    End of Session    OT Visit Diagnosis: Unsteadiness on feet (R26.81);Other abnormalities of gait and mobility (R26.89);Muscle weakness (generalized) (M62.81)   Activity Tolerance Patient tolerated treatment well   Patient Left in bed;with call bell/phone within reach;with family/visitor present   Nurse Communication          Time: 4166-0630 OT Time Calculation (min): 18 min  Charges: OT General Charges $OT Visit: 1 Visit OT Treatments $Self Care/Home Management : 8-22 mins  Adam Plowman, MS Acute Rehabilitation Department Office# [phone]   Adam Chung 09/08/2022, 4:29 PM

## 2022-09-09 LAB — URINE CULTURE: Culture: >=100000 — AB

## 2022-09-09 LAB — RENAL FUNCTION PANEL
Albumin: 2.9 g/dL — ABNORMAL LOW (ref 3.5–5.0)
Anion gap: 6 (ref 5–15)
BUN: 74 mg/dL — ABNORMAL HIGH (ref 8–23)
CO2: 22 mmol/L (ref 22–32)
Calcium: 8.5 mg/dL — ABNORMAL LOW (ref 8.9–10.3)
Chloride: 115 mmol/L — ABNORMAL HIGH (ref 98–111)
Creatinine, Ser: 2.49 mg/dL — ABNORMAL HIGH (ref 0.61–1.24)
GFR, Estimated: 27 mL/min — ABNORMAL LOW (ref >60)
Glucose, Bld: 133 mg/dL — ABNORMAL HIGH (ref 70–99)
Phosphorus: 3 mg/dL (ref 2.5–4.6)
Potassium: 2.9 mmol/L — ABNORMAL LOW (ref 3.5–5.1)
Sodium: 143 mmol/L (ref 135–145)

## 2022-09-09 LAB — GLUCOSE, CAPILLARY
Glucose-Capillary: 136 mg/dL — ABNORMAL HIGH (ref 70–99)
Glucose-Capillary: 139 mg/dL — ABNORMAL HIGH (ref 70–99)

## 2022-09-09 MED ORDER — OLMESARTAN MEDOXOMIL 20 MG PO TABS: 20.0000 mg | ORAL_TABLET | Freq: Every day | ORAL | Status: DC

## 2022-09-09 MED ORDER — POTASSIUM CHLORIDE CRYS ER 20 MEQ PO TBCR
60.0000 meq | EXTENDED_RELEASE_TABLET | Freq: Once | ORAL | Status: AC
  Administered 2022-09-09: 60 meq via ORAL
  Filled 2022-09-09: qty 3

## 2022-09-09 MED ORDER — AMLODIPINE BESYLATE 5 MG PO TABS: 5.0000 mg | ORAL_TABLET | Freq: Every day | ORAL | 0 refills | Status: DC

## 2022-09-09 NOTE — Progress Notes (Signed)
Mobility Specialist - Progress Note   09/09/22 0956  Mobility  Activity Ambulated with assistance in hallway  Level of Assistance Contact guard assist, steadying assist  Assistive Device None  Distance Ambulated (ft) 500 ft  Activity Response Tolerated well  Mobility Referral Yes  $Mobility charge 1 Mobility   Pt received in bed and agreed for mobility, no c/o pain nor discomfort during ambulation. Some unsteadiness at points. Pt back to bed with all needs left and bed alarm back on.   Roderick Pee Mobility Specialist

## 2022-09-09 NOTE — Care Management Important Message (Signed)
Important Message  Patient Details IM Letter given to the Patient. Name: MANASSEH PITTSLEY MRN: [id number] Date of Birth: 06-10-1951   Medicare Important Message Given:  Yes     Kerin Salen 09/09/2022, 10:11 AM

## 2022-09-09 NOTE — Progress Notes (Signed)
[facility] Progress Note  Subjective: seen in room, in street clothes, told he was most likely going home today.   Vitals:   09/08/22 0646 09/08/22 1318 09/08/22 2053 09/09/22 0439  BP: 139/73 (!) 144/78 (!) 149/77 (!) 144/76  Pulse: 83 88 81 78  Resp: '16 18 16 16  '$ Temp: 98.2 F (36.8 C) 98.1 F (36.7 C) 98.3 F (36.8 C) 98.6 F (37 C)  TempSrc: Oral Oral Oral Oral  SpO2: 98% 99% 98% 97%  Weight:      Height:        Exam:  alert, nad   no jvd  Chest cta bilat  Cor reg no RG  Abd soft ntnd no ascites, R ileal conduit with clear yellow urine with mucus , large amts clear yellow urine in the bag   Ext no LE edema   Alert, NF, ox3, no asterixis, better   Summary: 27M with a PMH sig for HTN,  DM II, bladder cancer s/p cystectomy with ileal conduit 10/2021. Pt sp recent tooth extraction and question having some N/V at home and not eating well. Question taking ARB/ amlodipine BP medication as well. Became confused and PCP sent him to ED.        Assessment/ Plan:  AKI superimposed on CKD 3a - b/l creatinine 1.2-1.3 per Jan 2023.  AKI on CKD likely d/t poor PO intake, concomitant use of benicar, hypovolemia. Renal US no obstruction. Creat down 4.1 > 3.2 > 2.5 today. Excellent UOP. OK for dc from a renal standpoint. He should improve back to his baseline creatinine, stage 3a. OK for dc. Avoid ARB for 1-2 months. He prefers to f/u with his PCP. If needs renal referral, PCP can do this in OP setting. Will sign off.  AMS - resolved Metabolic acidosis - Ileal conduit +AKI, resolved DM II - per primary HTN - holding ARB, norvasc started here. BP's good Bladder ca s/p cystectomy w/ ileal conduit - no obstruction per CT  Rob Trena Dunavan 09/09/2022, 1:31 PM   Recent Labs  Lab 09/06/22 1718 09/06/22 2300 09/08/22 0502 09/09/22 0511  HGB 12.3*  --   --   --   ALBUMIN 4.0  --   --  2.9*  CALCIUM 10.1   < > 8.6* 8.5*  PHOS  --   --   --  3.0  CREATININE 5.34*   < >  3.25* 2.49*  K 5.7*   < > 3.0* 2.9*   < > = values in this interval not displayed.    No results for input(s): "IRON", "TIBC", "FERRITIN" in the last 168 hours. Inpatient medications:  amLODipine  5 mg Oral Daily   Chlorhexidine Gluconate Cloth  6 each Topical Daily   heparin  5,000 Units Subcutaneous Q8H   insulin aspart  0-6 Units Subcutaneous TID WC   potassium chloride  60 mEq Oral Once    sodium bicarbonate 150 mEq in sterile water 1,150 mL infusion 75 mL/hr at 09/09/22 0630    acetaminophen, hydrALAZINE, mouth rinse

## 2022-09-09 NOTE — Discharge Summary (Signed)
Physician Discharge Summary   Adam Chung OEV:[id number] DOB: 19-Apr-1951 DOA: 09/06/2022  PCP: Rusty Aus, MD  Admit date: 09/06/2022 Discharge date: 09/09/2022  Barriers to discharge: none  Admitted From: Home Disposition:  Home Discharging physician: Dwyane Dee, MD  Recommendations for Outpatient Follow-up:  Repeat BMP at follow up; may need to extend amlodipine course if renal fxn not back to baseline and keep holding ARB   Discharge Condition: stable CODE STATUS: Full Diet recommendation:  Diet Orders (From admission, onward)     Start     Ordered   09/09/22 0000  Diet general        09/09/22 1045   09/08/22 1134  Diet regular Room service appropriate? Yes; Fluid consistency: Thin  Diet effective now       Question Answer Comment  Room service appropriate? Yes   Fluid consistency: Thin      09/08/22 1135            Hospital Course: Adam Chung is a 71 yo male with PMH CKD3b, HTN, DMII, bladder cancer s/p cystoprostatectomy with ileal conduit who presented with altered mentation. On work-up, he was found to have acute on chronic renal failure.  BUN 126, creatinine 5.34. Imaging obtained with CT abdomen/pelvis and renal ultrasound.  No obstructions nor hydronephrosis appreciated.  Right ileal conduit formation was appreciated.  He was initiated on fluids and nephrology was consulted. Renal function slowly improved with fluids and trending.  Assessment and Plan: * Acute renal failure superimposed on stage 3b chronic kidney disease ([place]) - patient has history of CKD3b. Baseline creat ~ 1.4 - 1.7, eGFR 40 - 45 - patient presented with increase in creat >0.3 mg/dL above baseline, creat increase >1.5x baseline presumed to have occurred within past 7 days PTA - creat 5.34 and BUN 126 on admission - had reported poor oral intake on admission -No obstruction noted on renal imaging - possible ATN from prolonged prerenal; also on ARB at home. No reports of  severe hypoTN or hypoxia  -Creatinine down to 2.49 at discharge - repeat BMP at follow-up  Acute metabolic encephalopathy-resolved as of 09/09/2022 - Secondary to uremia - now improved today   Weakness - Expected in setting of significant renal failure on admission but should improve quickly as renal function continues to recover -Patient did well with PT evaluation with no outpatient follow-up recommended -Evaluated by OT with recommendation for SNF.  He showed some confusion with pill counting on 10/11 however also not unexpected in setting of resolving uremia; family also able to provide support at home on discharge (med organization, help with meals, etc)  Bladder cancer [facility]) s/p cystoprostatectomy and ileal conduit  -no obstruction on CT A/P or renal ultrasound -follows urology outpt -Likely colonized.  Urine culture reviewed during hospitalization and not considered infectious.  Type 2 diabetes mellitus with stage 4 chronic kidney disease, with long-term current use of insulin (HCC) - Home regimen resumed  Hypertension Holding ARB due to AKI -Amlodipine continued for 1 week at discharge for blood pressure control then okay to resume Benicar  Metabolic acidosis-resolved as of 09/09/2022 - Due to renal failure.  Improved with bicarb drip  Hyperkalemia-resolved as of 09/08/2022 - Improved with treatment of AKI       The patient's chronic medical conditions were treated accordingly per the patient's home medication regimen except as noted.  On day of discharge, patient was felt deemed stable for discharge. Patient/family member advised to call PCP or come back to  ER if needed.   Principal Diagnosis: Acute renal failure superimposed on stage 3b chronic kidney disease [facility])  Discharge Diagnoses: Active Hospital Problems   Diagnosis Date Noted   Acute renal failure superimposed on stage 3b chronic kidney disease ([place]) 09/06/2022    Priority: 1.   Weakness 09/08/2022     Priority: 3.   Bladder cancer ([place]) 11/27/2021    Priority: 3.   Hypertension 08/07/2021   Type 2 diabetes mellitus with stage 4 chronic kidney disease, with long-term current use of insulin ([place]) 08/07/2021    Resolved Hospital Problems   Diagnosis Date Noted Date Resolved   Acute metabolic encephalopathy 01/21/3611 09/09/2022    Priority: 2.   Metabolic acidosis 24/49/7530 09/09/2022   Hyperkalemia 09/20/2021 09/08/2022     Discharge Instructions     Diet general   Complete by: As directed    Increase activity slowly   Complete by: As directed       Allergies as of 09/09/2022       Reactions   Apple Juice Anaphylaxis   Penicillins Swelling   Swelling of lip per patient Did it involve swelling of the face/tongue/throat, SOB, or low BP? Yes Did it involve sudden or severe rash/hives, skin peeling, or any reaction on the inside of your mouth or nose? No Did you need to seek medical attention at a hospital or doctor's office? Yes When did it last happen?    childhood   If all above answers are "NO", may proceed with cephalosporin use.        Medication List     STOP taking these medications    ciprofloxacin 500 MG tablet Commonly known as: CIPRO   clindamycin 150 MG capsule Commonly known as: CLEOCIN   insulin glargine-yfgn 100 UNIT/ML injection Commonly known as: SEMGLEE   ondansetron 8 MG disintegrating tablet Commonly known as: Zofran ODT   oxyCODONE-acetaminophen 5-325 MG tablet Commonly known as: Percocet   senna-docusate 8.6-50 MG tablet Commonly known as: Senokot-S       TAKE these medications    Ensure Max Protein Liqd Take 330 mLs (11 oz total) by mouth 2 (two) times daily.   (feeding supplement) PROSource Plus liquid Take 30 mLs by mouth 2 (two) times daily between meals.   acetaminophen 500 MG tablet Commonly known as: TYLENOL Take 2 tablets (1,000 mg total) by mouth every 6 (six) hours as needed.   amLODipine 5 MG tablet Commonly  known as: NORVASC Take 1 tablet (5 mg total) by mouth daily for 7 days. Start taking on: September 10, 2022   glimepiride 4 MG tablet Commonly known as: AMARYL Take 4 mg by mouth daily with breakfast.   insulin aspart 100 UNIT/ML FlexPen Commonly known as: NOVOLOG Inject 4 Units into the skin 3 (three) times daily with meals. Give only if patient consumes > 50% of meals.   Levemir FlexTouch 100 UNIT/ML FlexPen Generic drug: insulin detemir Inject 15 Units into the skin 2 (two) times daily.   multivitamin with minerals Tabs tablet Take 1 tablet by mouth daily.   olmesartan 20 MG tablet Commonly known as: BENICAR Take 1 tablet (20 mg total) by mouth daily. Start taking on: September 16, 2022 What changed: These instructions start on September 16, 2022. If you are unsure what to do until then, ask your doctor or other care provider.        Follow-up Information     [facility], [facility] Follow up.   Specialty: [place]  Why: Frances Furbish will follow up with you to provide home health services Contact information: [address] [place] [address] [phone]                Allergies  Allergen Reactions   Apple Juice Anaphylaxis   Penicillins Swelling    Swelling of lip per patient  Did it involve swelling of the face/tongue/throat, SOB, or low BP? Yes Did it involve sudden or severe rash/hives, skin peeling, or any reaction on the inside of your mouth or nose? No Did you need to seek medical attention at a hospital or doctor's office? Yes When did it last happen?    childhood   If all above answers are "NO", may proceed with cephalosporin use.      Consultations: Nephrology  Procedures:   Discharge Exam: BP (!) 144/76 (BP Location: Right Arm)   Pulse 78   Temp 98.6 F (37 C) (Oral)   Resp 16   Ht 5\' 11"  (1.803 m)   Wt 78.6 kg   SpO2 97%   BMI 24.17 kg/m  Physical Exam Constitutional:      General: He is not in acute  distress.    Appearance: Normal appearance.  HENT:     Head: Normocephalic and atraumatic.     Mouth/Throat:     Mouth: Mucous membranes are moist.  Eyes:     Extraocular Movements: Extraocular movements intact.  Cardiovascular:     Rate and Rhythm: Normal rate and regular rhythm.  Pulmonary:     Effort: Pulmonary effort is normal.     Breath sounds: Normal breath sounds.  Abdominal:     General: Bowel sounds are normal. There is no distension.     Tenderness: There is no abdominal tenderness.  Musculoskeletal:        General: Normal range of motion.     Cervical back: Normal range of motion.  Skin:    General: Skin is warm and dry.  Neurological:     General: No focal deficit present.     Mental Status: He is alert.     Comments: No asterixis  Psychiatric:        Mood and Affect: Mood normal.      The results of significant diagnostics from this hospitalization (including imaging, microbiology, ancillary and laboratory) are listed below for reference.   Microbiology: Recent Results (from the past 240 hour(s))  Urine Culture     Status: Abnormal   Collection Time: 09/06/22  8:34 PM   Specimen: Urine, Catheterized  Result Value Ref Range Status   Specimen Description   Final    URINE, CATHETERIZED Performed at [facility], [address], [place], [place]    Special Requests   Final    NONE Performed at [facility], [address], [place], [place]    Culture (A)  Final    >=100,000 COLONIES/mL ESCHERICHIA COLI >=100,000 COLONIES/mL ENTEROCOCCUS FAECALIS    Report Status 09/09/2022 FINAL  Final   Organism ID, Bacteria ESCHERICHIA COLI (A)  Final   Organism ID, Bacteria ENTEROCOCCUS FAECALIS (A)  Final      Susceptibility   Escherichia coli - MIC*    AMPICILLIN >=32 RESISTANT Resistant     CEFAZOLIN >=64 RESISTANT Resistant     CEFEPIME 0.5 SENSITIVE Sensitive     CEFTRIAXONE 32 RESISTANT Resistant      CIPROFLOXACIN >=4 RESISTANT Resistant     GENTAMICIN <=1 SENSITIVE Sensitive  IMIPENEM <=0.25 SENSITIVE Sensitive     NITROFURANTOIN 32 SENSITIVE Sensitive     TRIMETH/SULFA <=20 SENSITIVE Sensitive     AMPICILLIN/SULBACTAM >=32 RESISTANT Resistant     PIP/TAZO 8 SENSITIVE Sensitive     * >=100,000 COLONIES/mL ESCHERICHIA COLI   Enterococcus faecalis - MIC*    AMPICILLIN <=2 SENSITIVE Sensitive     NITROFURANTOIN <=16 SENSITIVE Sensitive     VANCOMYCIN 1 SENSITIVE Sensitive     * >=100,000 COLONIES/mL ENTEROCOCCUS FAECALIS  MRSA Next Gen by PCR, Nasal     Status: None   Collection Time: 09/07/22  4:24 AM   Specimen: Nasal Mucosa; Nasal Swab  Result Value Ref Range Status   MRSA by PCR Next Gen NOT DETECTED NOT DETECTED Final    Comment: (NOTE) The GeneXpert MRSA Assay (FDA approved for NASAL specimens only), is one component of a comprehensive MRSA colonization surveillance program. It is not intended to diagnose MRSA infection nor to guide or monitor treatment for MRSA infections. Test performance is not FDA approved in patients less than 6 years old. Performed at [facility], [place] [address] [place], [place] [address]      Labs: BNP (last 3 results) No results for input(s): "BNP" in the last 8760 hours. Basic Metabolic Panel: Recent Labs  Lab 09/06/22 2300 09/07/22 0330 09/07/22 0831 09/08/22 0502 09/09/22 0511  NA 140 142 142 143 143  K 5.1 4.2 3.9 3.0* 2.9*  CL 127* 126* 124* 120* 115*  CO2 8* 9* 12* 18* 22  GLUCOSE 129* 67* 77 121* 133*  BUN 124* 120* 115* 90* 74*  CREATININE 4.78* 4.53* 4.10* 3.25* 2.49*  CALCIUM 9.7 9.4 9.6 8.6* 8.5*  PHOS  --   --   --   --  3.0   Liver Function Tests: Recent Labs  Lab 09/06/22 1718 09/09/22 0511  AST 13*  --   ALT 11  --   ALKPHOS 107  --   BILITOT 0.7  --   PROT 7.7  --   ALBUMIN 4.0 2.9*   No results for input(s): "LIPASE", "AMYLASE" in the last 168 hours. No results for input(s):  "AMMONIA" in the last 168 hours. CBC: Recent Labs  Lab 09/06/22 1718  WBC 10.4  NEUTROABS 7.6  HGB 12.3*  HCT 39.0  MCV 96.8  PLT 278   Cardiac Enzymes: Recent Labs  Lab 09/06/22 2300  CKTOTAL 47*   BNP: Invalid input(s): "POCBNP" CBG: Recent Labs  Lab 09/08/22 1139 09/08/22 1707 09/08/22 2127 09/09/22 0739 09/09/22 1141  GLUCAP 132* 174* 194* 136* 139*   D-Dimer No results for input(s): "DDIMER" in the last 72 hours. Hgb A1c Recent Labs    09/06/22 1718  HGBA1C 10.3*   Lipid Profile No results for input(s): "CHOL", "HDL", "LDLCALC", "TRIG", "CHOLHDL", "LDLDIRECT" in the last 72 hours. Thyroid function studies No results for input(s): "TSH", "T4TOTAL", "T3FREE", "THYROIDAB" in the last 72 hours.  Invalid input(s): "FREET3" Anemia work up No results for input(s): "VITAMINB12", "FOLATE", "FERRITIN", "TIBC", "IRON", "RETICCTPCT" in the last 72 hours. Urinalysis    Component Value Date/Time   COLORURINE YELLOW 09/06/2022 1910   APPEARANCEUR TURBID (A) 09/06/2022 1910   APPEARANCEUR Cloudy (A) 08/18/2021 0908   LABSPEC 1.013 09/06/2022 1910   PHURINE 6.0 09/06/2022 1910   GLUCOSEU NEGATIVE 09/06/2022 1910   HGBUR SMALL (A) 09/06/2022 1910   BILIRUBINUR NEGATIVE 09/06/2022 1910   BILIRUBINUR Negative 08/18/2021 0908   KETONESUR NEGATIVE 09/06/2022 1910   PROTEINUR >=300 (A) 09/06/2022  [place] 09/06/2022 1910   LEUKOCYTESUR LARGE (A) 09/06/2022 1910   Sepsis Labs Recent Labs  Lab 09/06/22 1718  WBC 10.4   Microbiology Recent Results (from the past 240 hour(s))  Urine Culture     Status: Abnormal   Collection Time: 09/06/22  8:34 PM   Specimen: Urine, Catheterized  Result Value Ref Range Status   Specimen Description   Final    URINE, CATHETERIZED Performed at [facility], [place] [address], [place], [place] [address]    Special Requests   Final    NONE Performed at [facility], [place]  [address], [place], [place] [address]    Culture (A)  Final    >=100,000 COLONIES/mL ESCHERICHIA COLI >=100,000 COLONIES/mL ENTEROCOCCUS FAECALIS    Report Status 09/09/2022 FINAL  Final   Organism ID, Bacteria ESCHERICHIA COLI (A)  Final   Organism ID, Bacteria ENTEROCOCCUS FAECALIS (A)  Final      Susceptibility   Escherichia coli - MIC*    AMPICILLIN >=32 RESISTANT Resistant     CEFAZOLIN >=64 RESISTANT Resistant     CEFEPIME 0.5 SENSITIVE Sensitive     CEFTRIAXONE 32 RESISTANT Resistant     CIPROFLOXACIN >=4 RESISTANT Resistant     GENTAMICIN <=1 SENSITIVE Sensitive     IMIPENEM <=0.25 SENSITIVE Sensitive     NITROFURANTOIN 32 SENSITIVE Sensitive     TRIMETH/SULFA <=20 SENSITIVE Sensitive     AMPICILLIN/SULBACTAM >=32 RESISTANT Resistant     PIP/TAZO 8 SENSITIVE Sensitive     * >=100,000 COLONIES/mL ESCHERICHIA COLI   Enterococcus faecalis - MIC*    AMPICILLIN <=2 SENSITIVE Sensitive     NITROFURANTOIN <=16 SENSITIVE Sensitive     VANCOMYCIN 1 SENSITIVE Sensitive     * >=100,000 COLONIES/mL ENTEROCOCCUS FAECALIS  MRSA Next Gen by PCR, Nasal     Status: None   Collection Time: 09/07/22  4:24 AM   Specimen: Nasal Mucosa; Nasal Swab  Result Value Ref Range Status   MRSA by PCR Next Gen NOT DETECTED NOT DETECTED Final    Comment: (NOTE) The GeneXpert MRSA Assay (FDA approved for NASAL specimens only), is one component of a comprehensive MRSA colonization surveillance program. It is not intended to diagnose MRSA infection nor to guide or monitor treatment for MRSA infections. Test performance is not FDA approved in patients less than 59 years old. Performed at [facility], [place] [address] [place], [place] [address]     Procedures/Studies: CT ABDOMEN PELVIS WO CONTRAST  Result Date: 09/06/2022 CLINICAL DATA:  Kidney failure, acute renal failure EXAM: CT ABDOMEN AND PELVIS WITHOUT CONTRAST TECHNIQUE: Multidetector CT imaging of the abdomen and pelvis was  performed following the standard protocol without IV contrast. RADIATION DOSE REDUCTION: This exam was performed according to the departmental dose-optimization program which includes automated exposure control, adjustment of the mA and/or kV according to patient size and/or use of iterative reconstruction technique. COMPARISON:  CT abdomen pelvis 11/22/2021, MRI abdomen 10/07/2021 FINDINGS: Lower chest: Trace hiatal hernia.  No acute abnormality. Hepatobiliary: Vague 1.6 cm hypodensity within the right hepatic lobe (2:13) may be artifactual. Redemonstration of vague right hepatic dome 0.6 cm hypodensity. Status post cholecystectomy. No biliary dilatation. Pancreas: No focal lesion. Normal pancreatic contour. No surrounding inflammatory changes. No main pancreatic ductal dilatation. Spleen: Normal in size without focal abnormality. Adrenals/Urinary Tract: No adrenal nodule bilaterally. No nephrolithiasis and no hydronephrosis. No definite contour-deforming renal mass. No ureterolithiasis or hydroureter. Status post radical prostatectomy  with right ileal conduit formation. Stomach/Bowel: Stomach is within normal limits. No evidence of bowel wall thickening or dilatation. Colonic diverticulosis. The appendix is not definitely identified with no inflammatory changes in the right lower quadrant to suggest acute appendicitis. Vascular/Lymphatic: No abdominal aorta or iliac aneurysm. Mild atherosclerotic plaque of the aorta and its branches. No abdominal, pelvic, or inguinal lymphadenopathy. Reproductive: Prostatectomy. Other: No intraperitoneal free fluid. No intraperitoneal free gas. No organized fluid collection. Musculoskeletal: No abdominal wall hernia or abnormality. Right gluteal musculature lipomatous lesion (2:67). No suspicious lytic or blastic osseous lesions. Stable densely sclerotic lesion within the left acetabula (2:73). Similar finding within the right iliac bone (2:56). No acute displaced fracture.  Multilevel degenerative changes of the spine. IMPRESSION: 1. No acute intra-intrapelvic abnormality with limited evaluation on this noncontrast study in a patient status post radical prostatectomy with right ileal conduit formation. 2. Trace hiatal hernia. 3. Colonic diverticulosis with no acute diverticulitis. 4.  Aortic Atherosclerosis (ICD10-I70.0). Electronically Signed   By: Iven Finn M.D.   On: 09/06/2022 21:08   US Renal  Result Date: 09/06/2022 CLINICAL DATA:  Renal failure, bladder cancer EXAM: RENAL / URINARY TRACT ULTRASOUND COMPLETE COMPARISON:  Ultrasound, 05/10/2022, CT abdomen pelvis, 11/22/2021 FINDINGS: Right Kidney: Renal measurements: 11.3 x 5.3 x 7.3 cm = volume: 230 mL. Echogenicity within normal limits. No mass or hydronephrosis visualized. Left Kidney: Renal measurements: 10.8 x 5.7 x 5.5 cm = volume: 175 mL. Echogenicity within normal limits. No mass or hydronephrosis visualized. Bladder: Absent. Other: None. IMPRESSION: 1. No acute abnormality.  No hydronephrosis. 2. The urinary bladder is surgically absent. Electronically Signed   By: Delanna Ahmadi M.D.   On: 09/06/2022 20:03   DG Chest Portable 1 View  Result Date: 09/06/2022 CLINICAL DATA:  Weakness EXAM: PORTABLE CHEST 1 VIEW COMPARISON:  08/26/2021 FINDINGS: The heart size and mediastinal contours are within normal limits. Both lungs are clear. The visualized skeletal structures are unremarkable. IMPRESSION: No acute abnormality of the lungs in AP portable projection. Electronically Signed   By: Delanna Ahmadi M.D.   On: 09/06/2022 17:50     Time coordinating discharge: Over 30 minutes    Dwyane Dee, MD  [facility] 09/09/2022, 5:13 PM

## 2022-09-09 NOTE — TOC Transition Note (Signed)
Transition of Care [facility]) - CM/SW Discharge Note   Patient Details  Name: Adam Chung MRN: [id number] Date of Birth: 19-Apr-1951  Transition of Care [facility]) CM/SW Contact:  Vassie Moselle, LCSW Phone Number: 09/09/2022, 12:50 PM   Clinical Narrative:    Met with pt and daughter at bedside to discuss discharge plans. Pt's daughter shares that she cannot be with pt 24/7 however is able to check in on him daily. Pt also has friend and brother who are able to assist him at home. Pt's daughter is interested in this pt receiving home health services for additional supports following his hospital stay. HHPT/OT/Aide services have been arranged with [facility]. Pt reports having no DME needs as he will be using RW from his brother.    Final next level of care: [place] Barriers to Discharge: No Barriers Identified   Patient Goals and CMS Choice Patient states their goals for this hospitalization and ongoing recovery are:: To return home CMS Medicare.gov Compare Post Acute Care list provided to:: Patient Choice offered to / list presented to : Patient  Discharge Placement                       Discharge Plan and Services In-house Referral: NA Discharge Planning Services: CM Consult Post Acute Care Choice: [place]          DME Arranged: N/A DME Agency: NA       HH Arranged: PT, OT, Nurse's Aide [place] Agency: [place] Date [place]: 09/09/22 Time [place]: 1250 Representative spoke with at [place]: [place] ([place]) Interventions     Readmission Risk Interventions    09/08/2022    3:18 PM  Readmission Risk Prevention Plan  Transportation Screening Complete  PCP or Specialist Appt within 3-5 Days Complete  HRI or [place] Complete  Social Work Consult for [place] Planning/Counseling Complete  Palliative Care Screening Not Applicable  Medication Review Designer, fashion/clothing) Complete

## 2022-09-09 NOTE — Discharge Instructions (Signed)
Hold Benicar for 1 week and use amlodipine instead for blood pressure control.   Follow up with primary care for repeat BMP in 1 week if able  Continue hydration with electrolyte solutions and water

## 2023-07-17 IMAGING — CT CT ABD-PELV W/O CM
2 of 4 series · 16 of 46 positions shown, 18 images · non-contrast
Comparison: August 06, 2021

CLINICAL DATA: Draining clear yellow urine.  Hydronephrosis.

EXAM:
CT ABDOMEN AND PELVIS WITHOUT CONTRAST
TECHNIQUE: Multidetector CT imaging of the abdomen and pelvis was performed
following the standard protocol without IV contrast.

[Series 2: routine abd/pel wo · axial · 0.88mm/px · z∈[-904,-444]mm · 13 of 102 slices shown, 15 images]
[im 5/102  soft-tissue]
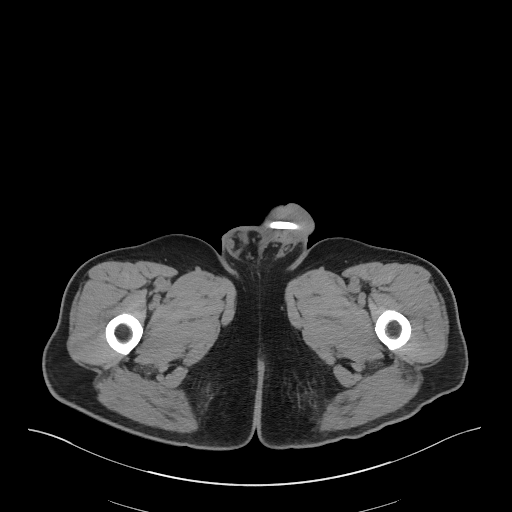
[im 5/102  bone]
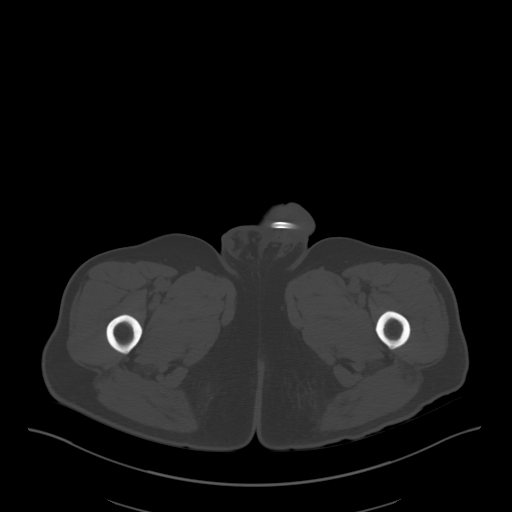
[im 13/102  soft-tissue]
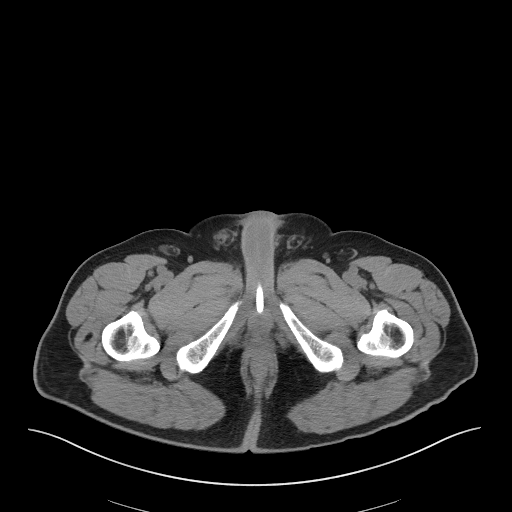
[im 22/102  soft-tissue]
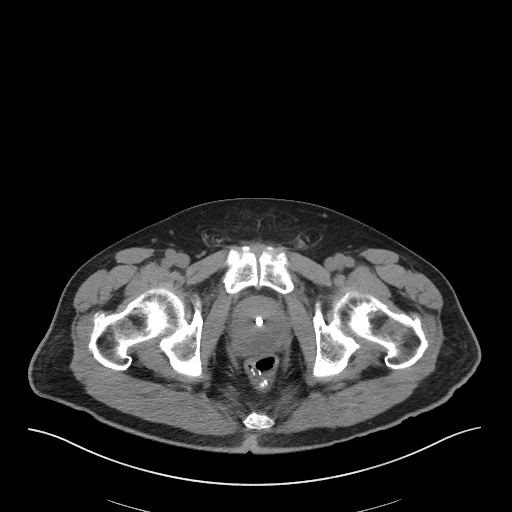
[im 30/102  soft-tissue]
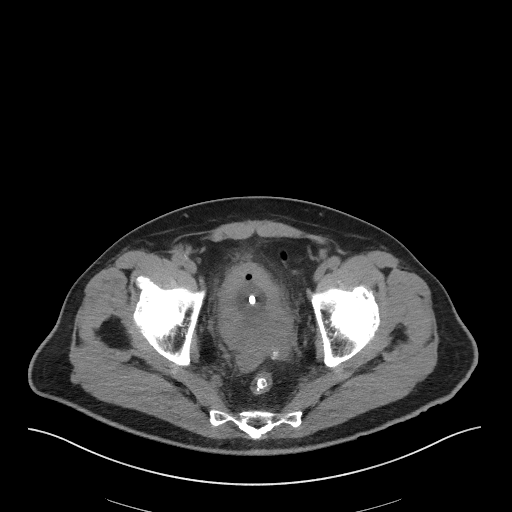
[im 34/102  soft-tissue]
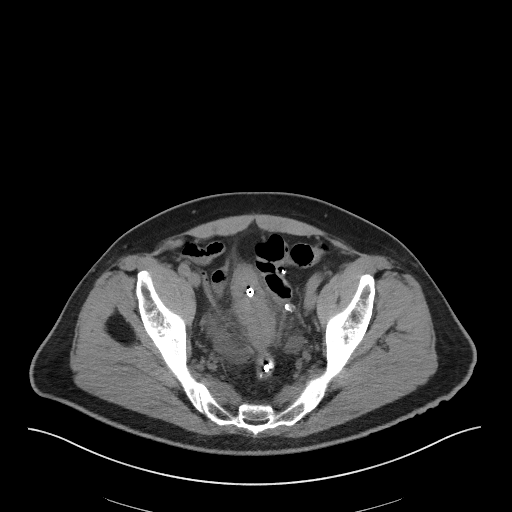
[im 43/102  soft-tissue]
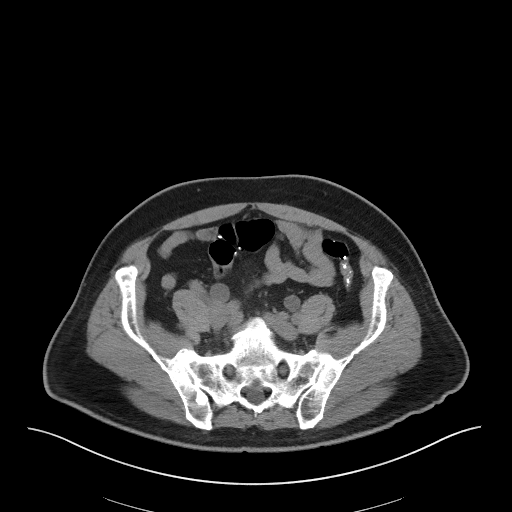
[im 51/102  soft-tissue]
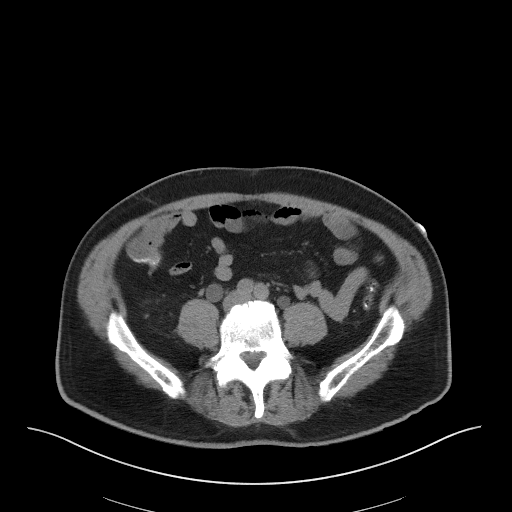
[im 59/102  soft-tissue]
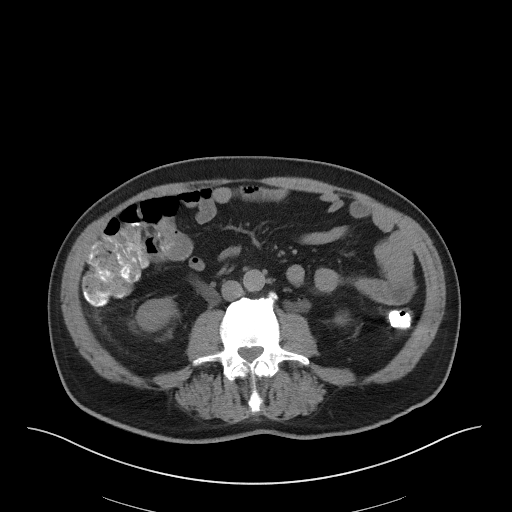
[im 68/102  soft-tissue]
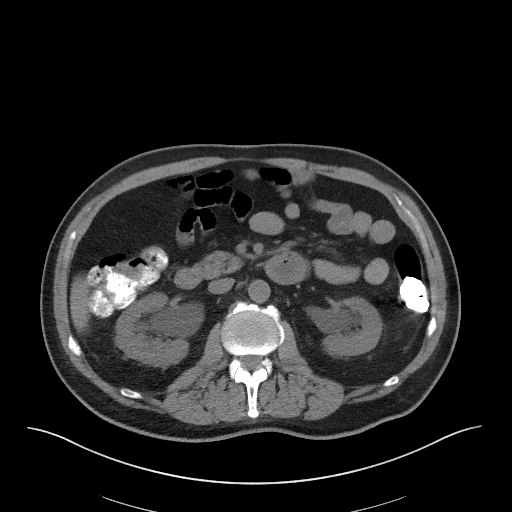
[im 68/102  bone]
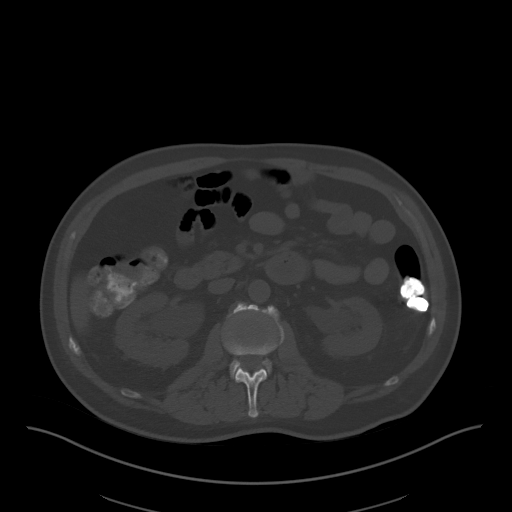
[im 72/102  soft-tissue]
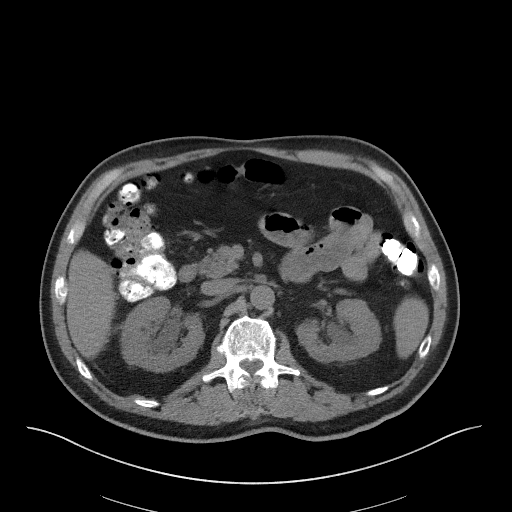
[im 80/102  soft-tissue]
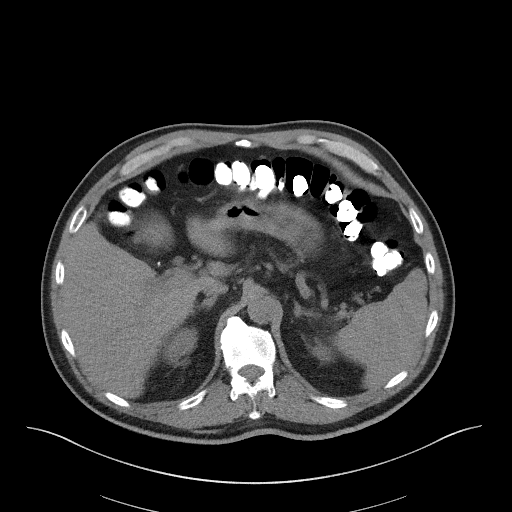
[im 89/102  soft-tissue]
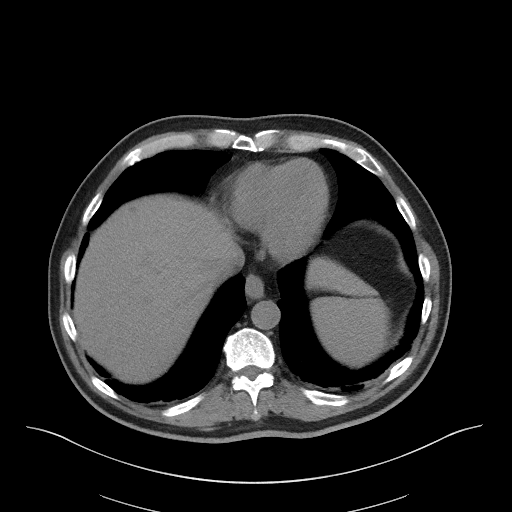
[im 97/102  soft-tissue]
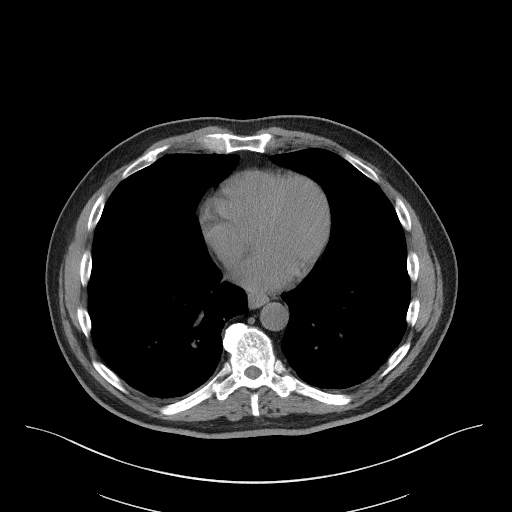

[Series 5: coronal st · coronal · 0.78mm/px · 3 of 101 slices shown]
[im 34/101  soft-tissue]
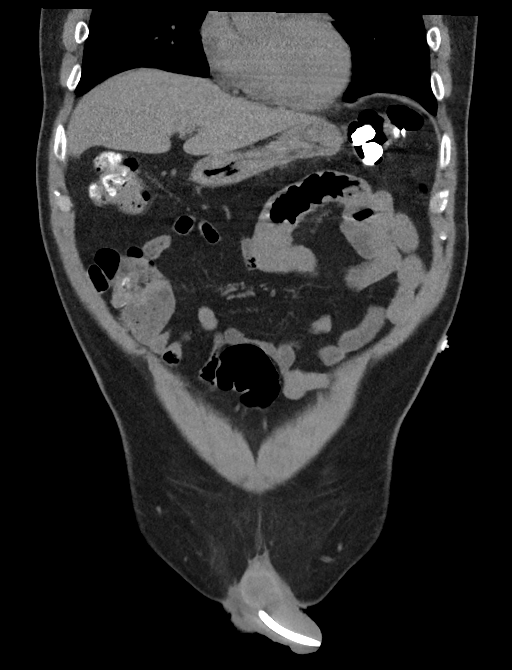
[im 45/101  soft-tissue]
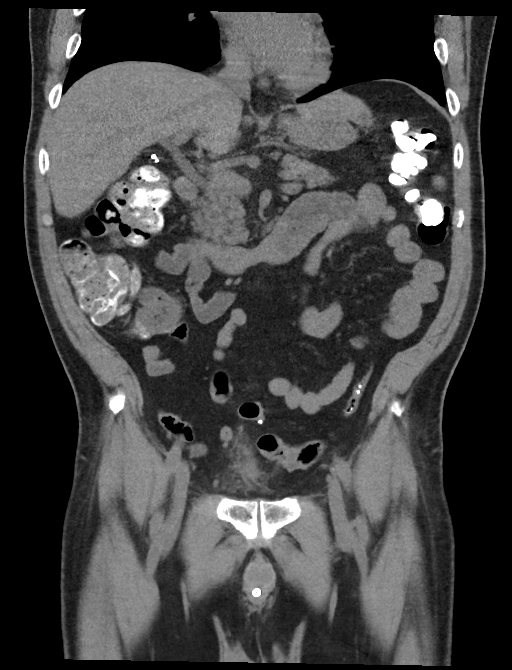
[im 56/101  soft-tissue]
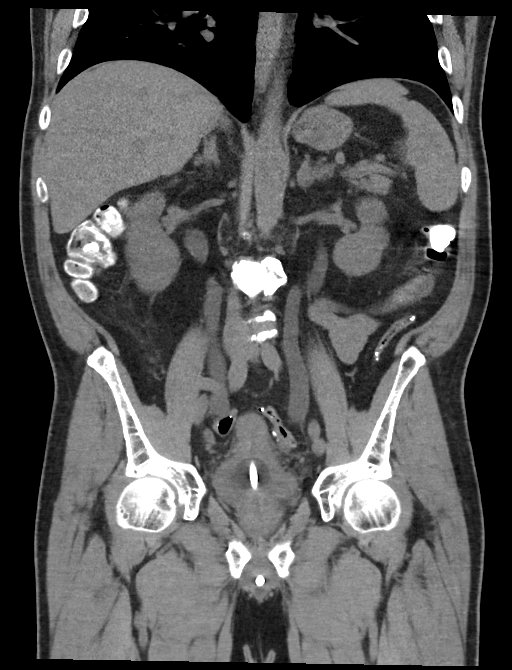

[16 of 46 positions shown; findings below may reference images not displayed]

FINDINGS: Lower chest: Mild atelectasis of bilateral lung bases are noted. The
lung bases are otherwise clear.

Hepatobiliary: 6 mm hypodensity at liver dome unchanged. Patient
status post prior cholecystectomy. Biliary tree is normal.

Pancreas: Unremarkable. No pancreatic ductal dilatation or
surrounding inflammatory changes.

Spleen: Normal in size without focal abnormality.

Adrenals/Urinary Tract: Bilateral adrenal glands are normal. Severe
bilateral hydronephrosis is identified unchanged. Minimal left
kidney cyst is unchanged. The bladder is decompressed with marked
diffuse thickened wall with a Foley catheter in place.

Stomach/Bowel: Stomach is within normal limits. Appendix appears
normal. No evidence of bowel wall thickening, distention, or
inflammatory changes. There is diverticulosis of the colon without
diverticulitis.

Vascular/Lymphatic: Aortic atherosclerosis. No enlarged abdominal or
pelvic lymph nodes.

Reproductive: Prostate gland areas unchanged compared prior exam.

Other: None.

Musculoskeletal: Stable.
IMPRESSION: 1. Severe bilateral hydronephrosis unchanged.
2. The bladder is decompressed with marked diffuse thickened wall
with a Foley catheter in place.
3. Aortic atherosclerosis.

Aortic Atherosclerosis ([id number]).

## 2023-10-30 IMAGING — CT CT ABD-PELV W/ CM
2 of 5 series · 15 of 46 positions shown, 17 images · IV contrast (omnipaque)
Comparison: 09/20/2021

CLINICAL DATA: Right lower quadrant abdominal pain.

EXAM:
CT ABDOMEN AND PELVIS WITH CONTRAST
TECHNIQUE: Multidetector CT imaging of the abdomen and pelvis was performed
using the standard protocol following bolus administration of
intravenous contrast.
CONTRAST:  80mL OMNIPAQUE IOHEXOL 350 MG/ML SOLN

[Series 2: axial st · axial · 0.85mm/px · z∈[+1035,+1440]mm · 12 of 95 slices shown, 14 images]
[im 7/95  soft-tissue]
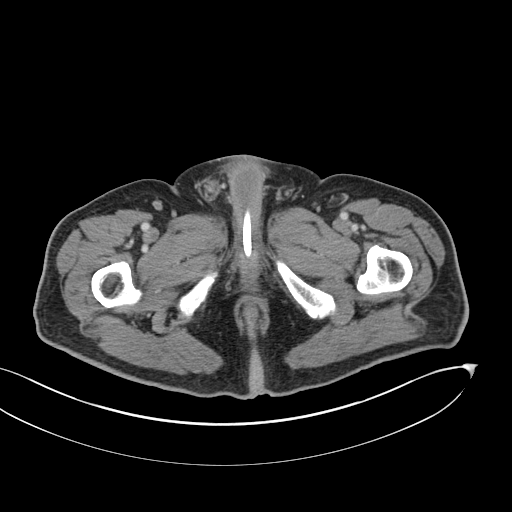
[im 7/95  bone]
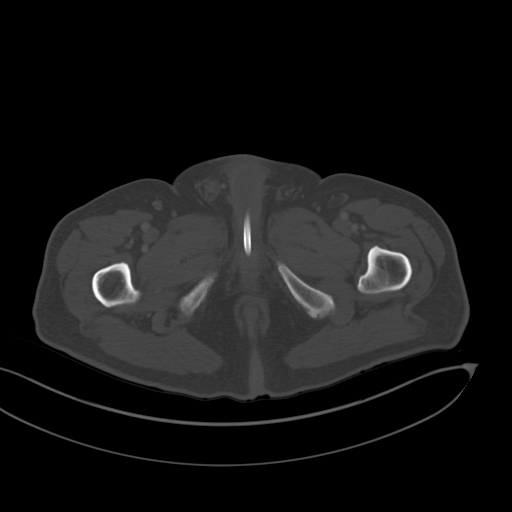
[im 13/95  soft-tissue]
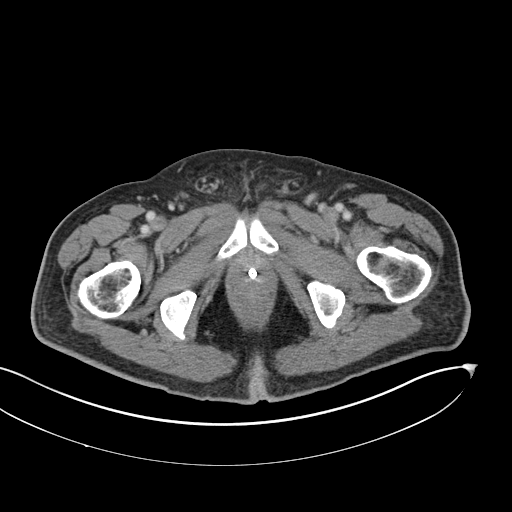
[im 19/95  soft-tissue]
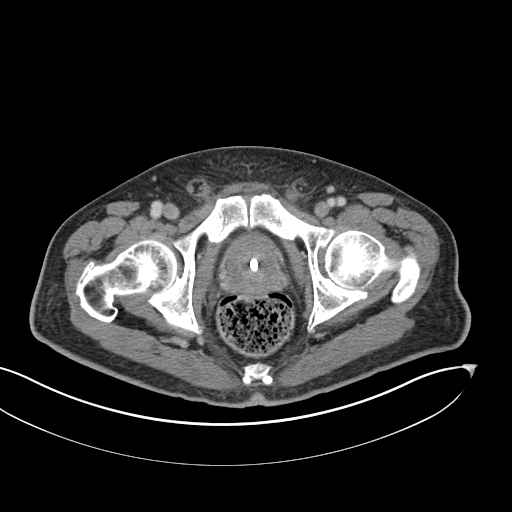
[im 32/95  soft-tissue]
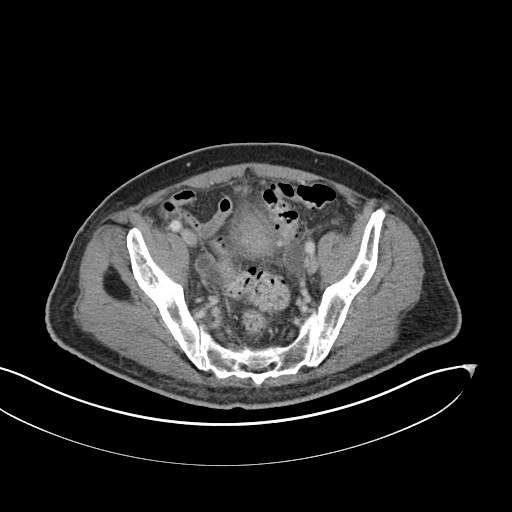
[im 38/95  soft-tissue]
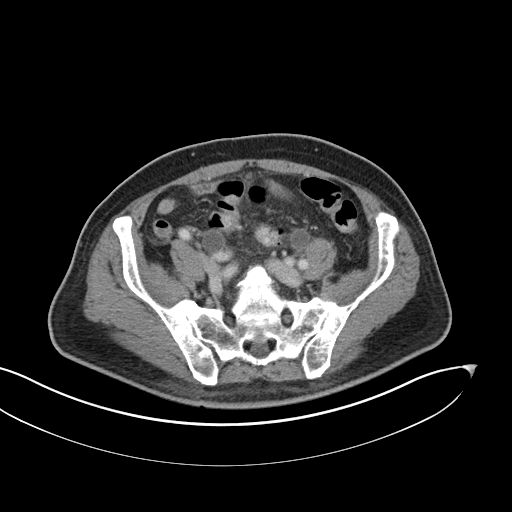
[im 44/95  soft-tissue]
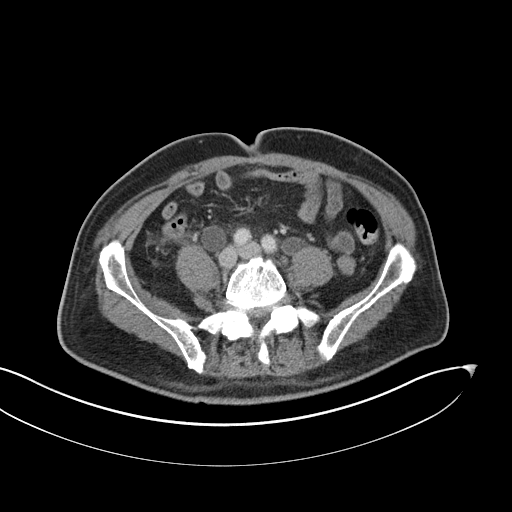
[im 51/95  soft-tissue]
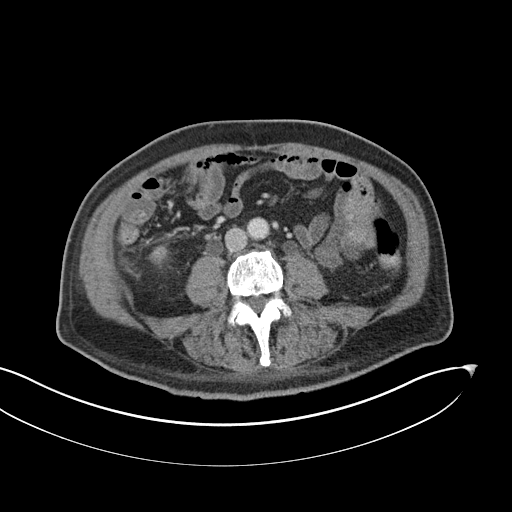
[im 57/95  soft-tissue]
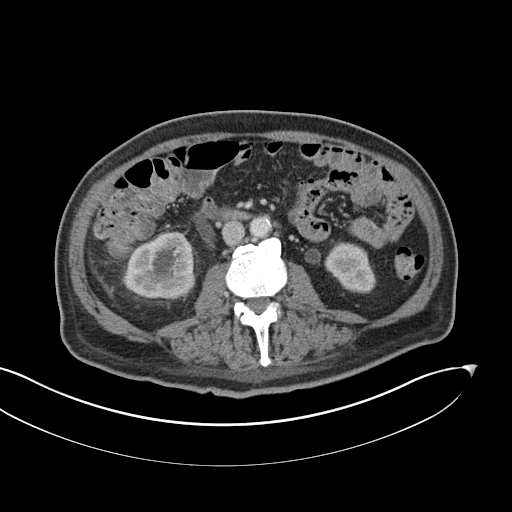
[im 63/95  soft-tissue]
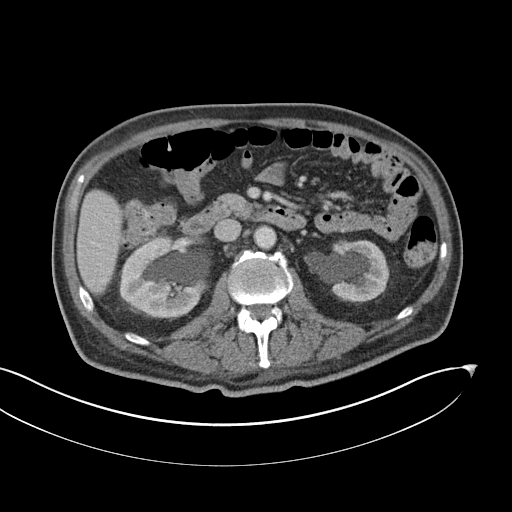
[im 63/95  bone]
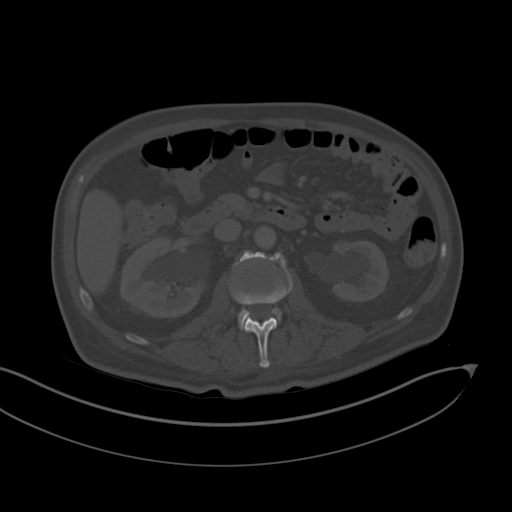
[im 76/95  soft-tissue]
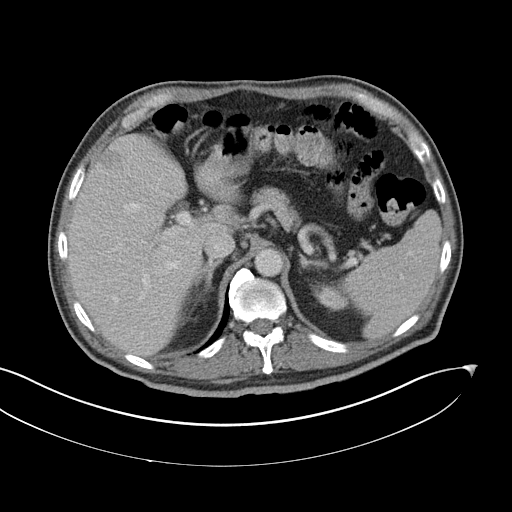
[im 82/95  soft-tissue]
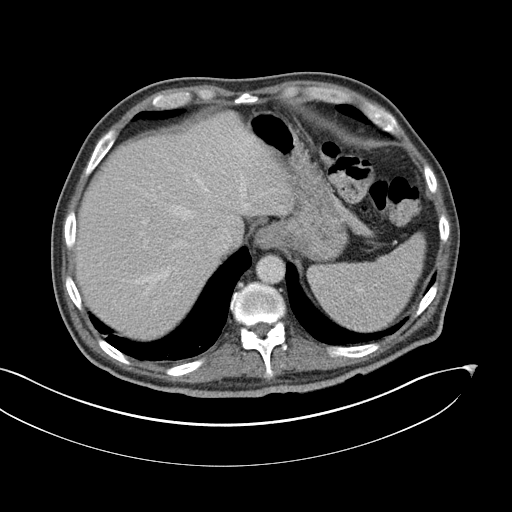
[im 88/95  soft-tissue]
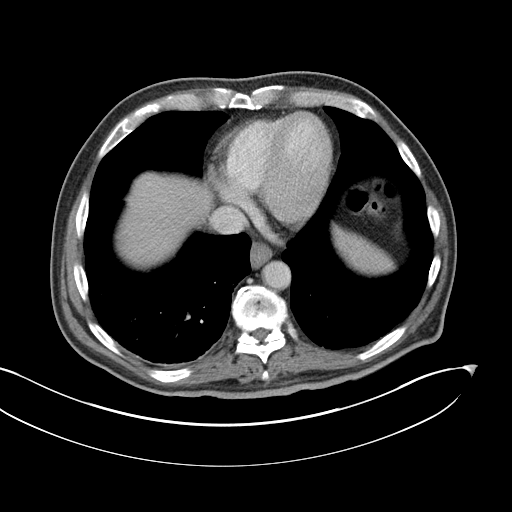

[Series 5: coronal st · coronal · 0.77mm/px · 3 of 138 slices shown]
[im 46/138  soft-tissue]
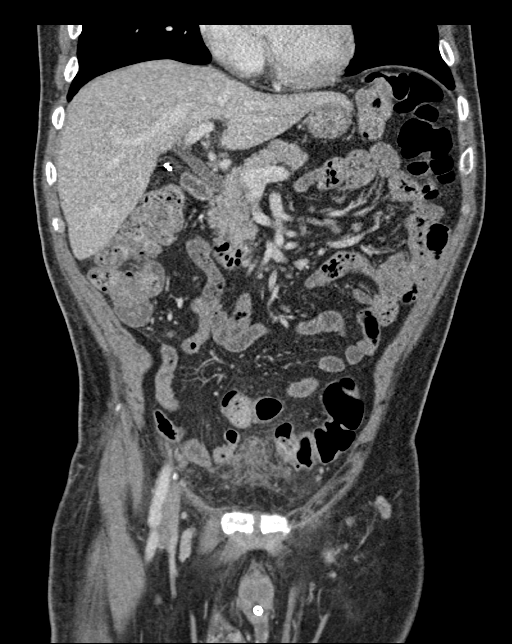
[im 61/138  soft-tissue]
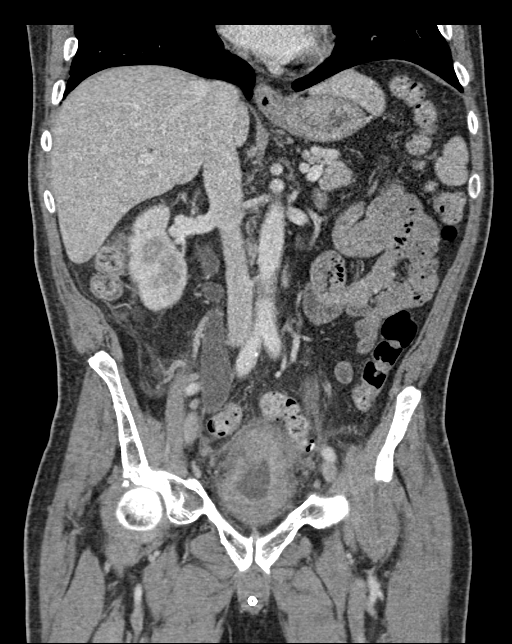
[im 77/138  soft-tissue]
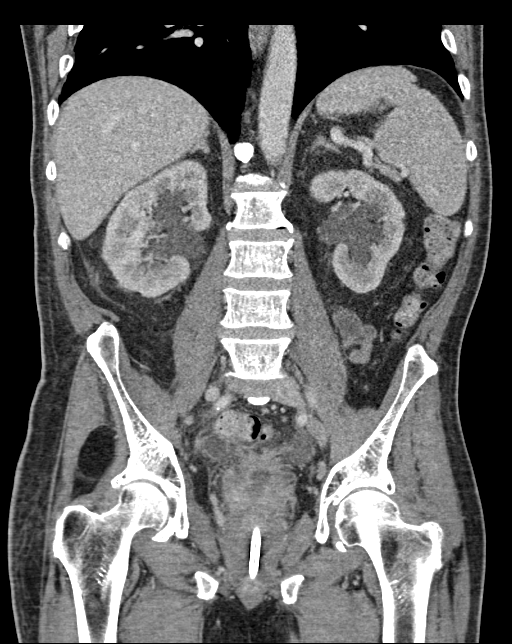

[15 of 46 positions shown; findings below may reference images not displayed]

FINDINGS: Lower chest: Unremarkable

Hepatobiliary: 6 mm hypodensity anterior hepatic dome is unchanged.
Small area of low attenuation in the anterior liver, adjacent to the
falciform ligament, is in a characteristic location for focal fatty
deposition. Small focus of hypo attenuation in the subcapsular liver
along the gallbladder fossa is stable, also likely focal fat
Gallbladder is surgically absent. No intrahepatic or extrahepatic
biliary dilation.

Pancreas: No focal mass lesion. No dilatation of the main duct. No
intraparenchymal cyst. No peripancreatic edema.

Spleen: No splenomegaly. No focal mass lesion.

Adrenals/Urinary Tract: No adrenal nodule or mass. Bilateral
hydroureteronephrosis with ureters dilated down to the level of the
bladder. There is marked circumferential bladder wall thickening
despite decompressed state with a urinary drainage catheter in situ.
Edema/inflammation surrounds the urinary bladder.

Stomach/Bowel: Stomach is nondistended. Duodenum is normally
positioned as is the ligament of Treitz. No small bowel wall
thickening. No small bowel dilatation. The terminal ileum is normal.
The appendix is normal. No gross colonic mass. No colonic wall
thickening. Diverticular changes are noted in the left colon without
evidence of diverticulitis.

Vascular/Lymphatic: There is mild atherosclerotic calcification of
the abdominal aorta without aneurysm. There is no gastrohepatic or
hepatoduodenal ligament lymphadenopathy. No retroperitoneal or
mesenteric lymphadenopathy. No pelvic sidewall lymphadenopathy.

Reproductive: The prostate gland and seminal vesicles are
unremarkable.

Other: No intraperitoneal free fluid.

Musculoskeletal: No worrisome lytic or sclerotic osseous
abnormality.
IMPRESSION: 1. Marked circumferential bladder wall thickening may be
infectious/inflammatory. Neoplasm not excluded. There is associated
moderate bilateral hydroureteronephrosis. Infectious/inflammatory
2. Otherwise no acute findings in the abdomen or pelvis.
Specifically, no findings to explain the patient's history of right
lower quadrant pain.
3. Aortic Atherosclerosis (GI2UP-ZU5.5).

## 2023-12-30 NOTE — Progress Notes (Signed)
 Medicare Wellness Visit   Providers Rendering Care Dr. Oneil Miller-Internal Medicine  Functional Assessment (1) Hearing: Demonstrates no difficulty in hearing during normal conversation (2) Risk of Falls: Patient denies any falls or near falls in the last year (3) Home Safety: Patient feels secure in their home. There are operational smoke alarms in multiple areas of the home. (4) Activities of Daily Living: Independently manages personal grooming and household chores, including cooking, cleaning and laundry.  Manages Personal finances without assistance.    Depression Screening PHQ 2/9 last 3 flowsheet values     09/16/2022    9:01 AM 12/21/2022    9:00 AM 12/30/2023    8:07 AM  PHQ-2/9 Depression Screening   Little interest or pleasure in doing things   0  Feeling down, depressed, or hopeless   0  Patient Health Questionnaire-2 Score   0  (OBSOLETE) Little interest or pleasure in doing things 0 0   (OBSOLETE) Feeling down, depressed, or hopeless (or irritable for Teens only)? 0 0   (OBSOLETE) Total Prescreening Score 0 0   (OBSOLETE) Total Score = 0 0      Depression Severity and Treatment Recommendations:  0-4= None  5-9= Mild / Treatment: Support, educate to call if worse; return in one month  10-14= Moderate / Treatment: Support, watchful waiting; Antidepressant or Psychotherapy  15-19= Moderately severe / Treatment: Antidepressant OR Psychotherapy  >= 20 = Major depression, severe / Antidepressant AND Psychotherapy   Cognitive impairment Oriented to person, place and time.  Responses appear appropriate and timely to this observer.   Prevention Plan  Item name                              Frequency        Month Due       Year Due Health Maintenance  Topic Date Due  . CBC  Never done  . Serum Phosphorus  Never done  . Parathyroid Hormone  Never done  . Hepatitis C Screen  Never done  . Monofilament Foot Exam  Never done  . Pneumococcal Vaccine: 50+ (1 of 2 -  PCV) Never done  . RSV Immunization Pregnant or 60+ (1 - Risk 60-74 years 1-dose series) Never done  . Diabetes Education  12/27/2019  . Eye Exam  07/07/2023  . Hemoglobin A1C  06/21/2024  . Creatinine Level  12/22/2024  . Lipid Panel  12/22/2024  . Annual Urine Albumin  Creatinine Ratio  12/22/2024  . Serum Bicarbonate  12/22/2024  . Serum Calcium   12/22/2024  . Depression Screening  12/29/2024  . Medicare Initial or AWV  12/29/2024  . PSA  12/22/2025  . Hib Vaccines  Aged Out  . Hepatitis A Vaccines  Aged Out  . Meningococcal B Vaccine  Aged Out  . Meningococcal ACWY Vaccine  Aged Out  . HPV Vaccines  Aged Out  . COVID-19 Vaccine  Discontinued  . Adult Tetanus (Td And Tdap)  Discontinued  . Colorectal Cancer Screening  Discontinued  . Shingrix  Discontinued  . AAA Screen  Discontinued  . Influenza Vaccine  Discontinued    Other personalized health advice Encouraged patient to exercise regularly.  Encouraged attention to diet with good intake of fruits, vegetables, and limitation of red meat to 2 times a week or less    End of Life Counseling Patient has a living will in place.  Patient is a full code.

## 2023-12-30 NOTE — Progress Notes (Signed)
 ANNUAL EXAM  Adam Chung is a 73 y.o. male  CHIEF COMPLAINT: Chief Complaint  Patient presents with  . Annual Exam    SUBJECTIVE: Patient working some.  Overall doing fairly well.  A1c down to 6.7 from 9.1, ileostomy doing well, PSA negative.  Stoma was a little bit irritated, creating some hematuria.  Having low blood sugars ______________________________________________________________________ A comprehensive ROS was negative in all 10 systems reviewed.  ALLERGIES: Apple juice, Other, Benicar  [olmesartan ], Penicillin, and Penicillins  Past Medical History:  Diagnosis Date  . Cervical disc disease 10/31/2015  . History of bladder cancer   . Hyperlipidemia, mixed 10/13/2015  . 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 Bladder cancer surgery    . CHOLECYSTECTOMY      Current Outpatient Medications  Medication Sig Dispense Refill  . acetaminophen  (TYLENOL  8 HOUR ORAL) Take by mouth As needed    . amLODIPine  (NORVASC ) 5 MG tablet Take 1 tablet by mouth once daily 90 tablet 0  . blood-glucose sensor (DEXCOM G7 SENSOR) Devi Use 1 each every 10 (ten) days 9 each 3  . cholecalciferol, vitamin D3, (VITAMIN D3 ORAL) Take by mouth 4 daily    . glimepiride (AMARYL) 4 MG tablet Take 0.5 tablets (2 mg total) by mouth daily with breakfast    . insulin  DEGLUDEC (TRESIBA FLEXTOUCH U-100) pen injector (concentration 100 units/mL) Inject 20 Units subcutaneously once daily Via patient assistance    . semaglutide (OZEMPIC) 0.25 mg or 0.5 mg (2 mg/3 mL) pen injector Inject 0.75 mLs (0.5 mg total) subcutaneously once a week Via Patient Assistance    . ACCU-CHEK GUIDE ME GLUCOSE MTR Misc once daily    . ACCU-CHEK GUIDE TEST STRIPS test strip 1 each (1 strip total) once daily    . ACCU-CHEK SOFTCLIX LANCETS lancets 2 (two) times daily     No current facility-administered medications for this visit.    PHYSICAL  EXAM: BP 130/70   Pulse 92   Ht 177.8 cm (5' 10)   Wt 85.8 kg (189 lb 3.2 oz)   SpO2 95%   BMI 27.15 kg/m   Body mass index is 27.15 kg/m.  Wt Readings from Last 2 Encounters:  12/30/23 85.8 kg (189 lb 3.2 oz)  12/01/23 87.4 kg (192 lb 9.6 oz)    BP Readings from Last 2 Encounters:  12/30/23 130/70  12/01/23 132/72    General. Alert oriented x3  Skin. No suspicious lesions or moles on trunk or abdomen Eyes. Sclera and conjunctiva clear; pupils equal round and reactive to light and accommodation; extraocular movements intact Ears. External normal; canals clear; tympanic membranes normal Nose. Mucosa healthy without drainage or ulceration Oropharynx. No suspicious lesions Neck. No swelling, masses, stiffness, pain, limited movement, carotid pulses normal bilaterally, thyroid normal size, no masses palpated.  No bruits Breasts. No masses noted Lungs. Respirations unlabored; clear to auscultation bilaterally Back. No spinal deformity Cardiovascular. Heart regular rate and rhythm without murmurs, gallops, or rubs Abdomen. Soft; non tender; non distended; normoactive bowel sounds; no masses or organomegaly Lymph Nodes. No significant cervical, supraclavicular, axillary or inguinal lymphadenopathy noted Musculoskeletal. No deformities; no active joint inflammation Extremities. Normal, no edema Pulses. Dorsalis pedis palpable and symmetric bilaterally Neurologic. Alert and oriented X3; speech intact; face symmetrical; moves all extremities well    ASSESSMENT/PLAN:  Insulin -dependent diabetes-A1c down to 6.7, having lows.  Due to adding Ozempic.  Good Amaryl 2 mg daily, reduce insulin   to 20 units daily, Ozempic increasing to 0.5.  Likely will go down further on insulin  and come off of the glimepiride, very proud of him.  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  Diabetes Action Plan   On track     My Goal: -Eat more healthy foods. How? eat 5-6 servings of fresh/frozen vegetables a day. -Take my medication. How? take my diabetes medication(s) as prescribed and take my blood pressure medication(s) as prescribed. -Manage my blood sugar levels better by Utilizing Dexcom G7 system.    . * Exercise  (pt-stated)   On track     Patient will exercise everyday     .  Maintain health/healthy lifestyle   On track       .phq2  Return in about 6 months (around 06/28/2024) for followup.                *Some images could not be shown.

## 2024-02-08 NOTE — Progress Notes (Signed)
Patient picked up meds.

## 2024-04-09 NOTE — Progress Notes (Signed)
 Chronic Care Management Plan of Care  PCP: Cleotilde Oneil Novel, MD   Summary/Recommendations:   Assessment: Diabetes Mellitus is under good control, < 7.0. Due for diabetic eye exam, declines today.   Plan: Continue current medications   Adam Chung is a 73 y.o. male who presents for Chronic Care Management for Diabetes and Hypertension per referral from Cleotilde Oneil Novel, MD. This comprehensive care plan has been tailored for this patient.   Adam Chung has verbally consented to participate in Chronic Care Management Services (CCM). Patient is aware of the cost sharing cost. Patient is aware that only one practitioner can furnish and be paid for CCM services during a calendar month. Patient is aware that he/she/they has the right to stop CCM services at any time (effective at the end of the calendar month.)   Subjective   Patient Concerns:  Current diet: 3 meals daily, keeps Nabs crackers for lows. Review of diet indicates patient is mostly avoiding concentrated carbohydrates, but he does endorse frequently snacking on trail mix.   Current exercise: No formal exercise, does try to stay busy with work and chores.   Diabetes Mellitus Type II  Unstable  Current meds:  Tresiba 30 units daily (0.23 u/kg)  Ozempic 0.5 mg weekly  Meds Tried/Failed: Novolog , Ozempic, Invokana, Metformin, Jardiance (Cost), Levemir   Self-Adjusted Tresiba to 30 units since last visit, feels this has worked better for maintaining his blood sugar.   Patient currently uses CGM - Dexcom  blood glucose monitoring. Receives directly from his pharmacy.   Any hypoglycemic episodes: Yes - a few mild episodes of hypoglycemia noted   Hypertension   Stable Current meds: Amlodipine  5 mg daily Meds Tried/Failed: Olmesartan  (AKI)   BP Readings from Last 3 Encounters:  12/30/23 130/70  12/01/23 132/72  11/07/23 132/78    Denies: chest pain on exertion, chest pain at rest, palpitations,  edema, and syncope   Avoiding excessive salt intake: Yes   Hyperlipidemia   Unstable Current meds: None  Meds Tried/Failed: Pravastatin, Simvastatin Never started any cholesterol therapies, is hesitant to take anything due to a bad experience with medications while he was being treated for bladder cancer.   Medication Compliance and Access:  Taking medications as directed:  Yes   Able to afford medications: Yes - Burrell Ohs and Ozempic via patient assistance.   Objective   There were no vitals filed for this visit.    There is no height or weight on file to calculate BMI. Home vitals:    There is no height or weight on file to calculate BMI.            Most recent labs: HgbA1c:  Lab Results  Component Value Date   HGBA1C 6.7 (H) 12/23/2023   HGBA1C 9.1 (H) 09/22/2023   HGBA1C 7.4 (H) 12/14/2022     LDL:  Lab Results  Component Value Date   LDLCALC 106 12/23/2023     The 10-year ASCVD risk score (Arnett DK, et al., 2019) is: 41.2%   Values used to calculate the score:     Age: 34 years     Sex: Male     Is Non-Hispanic African American: No     Diabetic: Yes     Tobacco smoker: No     Systolic Blood Pressure: 130 mmHg     Is BP treated: No     HDL Cholesterol: 35.3 mg/dL     Total Cholesterol: 173 mg/dL   Assessment and Care Plan  Diabetes Mellitus Assessment: Diabetes Mellitus is under good control, < 7.0. CGM report indicates blood sugar is at goal 87% of the time and elevated 13% of the time.  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   Due for diabetic eye exam, declines today.   Plan: Continue current medications   Encouraged Diabetic Plate Method limiting carbohydrates to 45-60 per meal, avoiding sugar containing beverages like sodas, tea, and fruit juices and to observe a sodium restricted/DASH diet.  Hypertension Assessment: Blood pressure is under good  control, 130-139/80-89, patient is not on ACEi/ARB therapy.  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 Declines statin therapy at this time.  Plan: Continue current therapy   Goals Addressed             This Visit's Progress   . Diabetes Action Plan   On track    My Goal: -Eat more healthy foods. How? eat 5-6 servings of fresh/frozen vegetables a day. -Take my medication. How? take my diabetes medication(s) as prescribed and take my blood pressure medication(s) as prescribed. -Manage my blood sugar levels better by Utilizing Dexcom G7 system.        This interdisciplinary team is responsible for creating and maintaining this shared plan of care.   Patient Care Team: Cleotilde Oneil Novel, MD as PCP - General (Internal Medicine) Sandria Ensign, PharmD as Pharmacist (Internal Medicine)   Follow-up   Follow up: 6 months  Future Appointments     Date/Time Provider Department Center Visit Type   06/22/2024 7:15 AM [facility] WEST LAB [facility] C LAB   06/29/2024 8:30 AM Cleotilde Oneil Novel, MD [facility] OFFICE VISIT   10/02/2024 9:00 AM Sandria Ensign, PharmD [facility] C CHRONIC CARE MANAGEMENT      The care plan was provided for the patient either in written format or through MyChart.   I spent 20 minutes with patient and/or family on this visit today.   Attestation Statement:   I personally performed the service, incident to.  (I2)   ENSIGN SANDRIA, PharmD  Provider countersignature: MARK F MILLER

## 2024-05-22 NOTE — Progress Notes (Signed)
 Received 4 boxes of Ozempic, 4 boxes of Tresiba, and 2 boxes of needles. Patient informed.

## 2024-06-29 NOTE — Progress Notes (Signed)
                            Patient Profile:   Adam Chung  is a 73 y.o.  male Chief Complaint  Patient presents with  . Follow-up      PROBLEM LIST: Past Medical History:  Diagnosis Date  . Cervical disc disease 10/31/2015  . History of bladder cancer   . Hyperlipidemia, mixed 10/13/2015  . 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 Bladder cancer surgery    . CHOLECYSTECTOMY    . OTHER SURGERY     Bladder removal  Jan 2023    ALLERGIES: Allergies  Allergen Reactions  . Apple Juice Anaphylaxis  . Other Anaphylaxis    Raw apples- anaphylaxis  . Benicar  [Olmesartan ] Other (See Comments)    Acute kidney injury  . Penicillin Swelling  . Penicillins Swelling    Swelling of lip per patient    CURRENT MEDICATIONS: Current Outpatient Medications  Medication Sig Dispense Refill  . amLODIPine  (NORVASC ) 5 MG tablet Take 1 tablet (5 mg total) by mouth once daily 90 tablet 3  . blood-glucose sensor (DEXCOM G7 SENSOR) Devi Use 1 each every 10 (ten) days 9 each 3  . cholecalciferol, vitamin D3, (VITAMIN D3 ORAL) Take by mouth 4 daily    . ibuprofen (MOTRIN) 400 MG tablet Take 400 mg by mouth as needed for Pain    . insulin  DEGLUDEC (TRESIBA FLEXTOUCH U-100) pen injector (concentration 100 units/mL) Inject 30 Units subcutaneously once daily Via patient assistance    . semaglutide (OZEMPIC) 0.25 mg or 0.5 mg (2 mg/3 mL) pen injector Inject 0.75 mLs (0.5 mg total) subcutaneously once a week Via Patient Assistance     No current facility-administered medications for this visit.      HPI   CLINICAL SUMMARY:  Patient doing well, down 3 pounds, A1c is averaging 7.0 on his phone, CGM.  Very consistent.  Active, no traveling  ROS: Review of systems is unremarkable for any active cardiac, respiratory, GI, GU, hematologic, neurologic, dermatologic, HEENT, or psychiatric symptoms except as  noted above, 10 systems reviewed.  No fevers, chills, or constitutional symptoms.   PHYSICAL EXAM  Vital signs:  BP (!) 150/80   Pulse 85   Ht 177.8 cm (5' 10)   Wt 84.4 kg (186 lb)   SpO2 97%   BMI 26.69 kg/m  Body mass index is 26.69 kg/m.   Wt Readings from Last 3 Encounters:  06/29/24 84.4 kg (186 lb)  01/12/24 84.5 kg (186 lb 3.2 oz)  12/30/23 85.8 kg (189 lb 3.2 oz)     BP Readings from Last 3 Encounters:  06/29/24 (!) 150/80  12/30/23 130/70  12/01/23 132/72    Constitutional:NAD Neck: supple, no thyromegaly, good ROM Respiratory:clear to auscultation, no rales or wheezes Cardiovascular:RRR, no murmur or gallop Abdominal:soft, good BS, NT Ext: no edema, good peripheral pulses Neuro: alert and oriented X 3, grossly nonfocal     ASSESSMENT/PLAN   Insulin -dependent diabetes-his CGM shows his A1c to be 7.0, numbers pending CKD 4-history creatinine 2.0, numbers pending 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

## 2024-08-21 ENCOUNTER — Inpatient Hospital Stay (HOSPITAL_COMMUNITY)
Admission: EM | Admit: 2024-08-21 | Discharge: 2024-08-28 | DRG: 234 | Disposition: A | Discharge status: Home Health | Attending: Surgery | Admitting: Surgery

## 2024-08-21 ENCOUNTER — Encounter (HOSPITAL_COMMUNITY): Admission: EM | Disposition: A | Payer: Self-pay | Source: Home / Self Care | Attending: Cardiology

## 2024-08-21 DIAGNOSIS — I444 Left anterior fascicular block: Secondary | ICD-10-CM | POA: Diagnosis present

## 2024-08-21 DIAGNOSIS — D62 Acute posthemorrhagic anemia: Secondary | ICD-10-CM | POA: Diagnosis not present

## 2024-08-21 DIAGNOSIS — Z79899 Other long term (current) drug therapy: Secondary | ICD-10-CM | POA: Diagnosis not present

## 2024-08-21 DIAGNOSIS — E119 Type 2 diabetes mellitus without complications: Secondary | ICD-10-CM | POA: Diagnosis not present

## 2024-08-21 DIAGNOSIS — I129 Hypertensive chronic kidney disease with stage 1 through stage 4 chronic kidney disease, or unspecified chronic kidney disease: Secondary | ICD-10-CM | POA: Diagnosis present

## 2024-08-21 DIAGNOSIS — I493 Ventricular premature depolarization: Secondary | ICD-10-CM | POA: Diagnosis present

## 2024-08-21 DIAGNOSIS — Z9079 Acquired absence of other genital organ(s): Secondary | ICD-10-CM | POA: Diagnosis not present

## 2024-08-21 DIAGNOSIS — E1122 Type 2 diabetes mellitus with diabetic chronic kidney disease: Secondary | ICD-10-CM | POA: Diagnosis present

## 2024-08-21 DIAGNOSIS — N1832 Chronic kidney disease, stage 3b: Secondary | ICD-10-CM | POA: Diagnosis present

## 2024-08-21 DIAGNOSIS — Z794 Long term (current) use of insulin: Secondary | ICD-10-CM | POA: Diagnosis not present

## 2024-08-21 DIAGNOSIS — Z87891 Personal history of nicotine dependence: Secondary | ICD-10-CM | POA: Diagnosis not present

## 2024-08-21 DIAGNOSIS — I2109 ST elevation (STEMI) myocardial infarction involving other coronary artery of anterior wall: Principal | ICD-10-CM | POA: Diagnosis present

## 2024-08-21 DIAGNOSIS — M47812 Spondylosis without myelopathy or radiculopathy, cervical region: Secondary | ICD-10-CM | POA: Diagnosis present

## 2024-08-21 DIAGNOSIS — Z951 Presence of aortocoronary bypass graft: Secondary | ICD-10-CM

## 2024-08-21 DIAGNOSIS — Z8551 Personal history of malignant neoplasm of bladder: Secondary | ICD-10-CM

## 2024-08-21 DIAGNOSIS — I2510 Atherosclerotic heart disease of native coronary artery without angina pectoris: Secondary | ICD-10-CM | POA: Diagnosis present

## 2024-08-21 DIAGNOSIS — Z936 Other artificial openings of urinary tract status: Secondary | ICD-10-CM | POA: Diagnosis not present

## 2024-08-21 DIAGNOSIS — M791 Myalgia, unspecified site: Secondary | ICD-10-CM | POA: Diagnosis present

## 2024-08-21 DIAGNOSIS — Z7985 Long-term (current) use of injectable non-insulin antidiabetic drugs: Secondary | ICD-10-CM | POA: Diagnosis not present

## 2024-08-21 DIAGNOSIS — I10 Essential (primary) hypertension: Secondary | ICD-10-CM

## 2024-08-21 DIAGNOSIS — Z01810 Encounter for preprocedural cardiovascular examination: Secondary | ICD-10-CM | POA: Diagnosis not present

## 2024-08-21 DIAGNOSIS — Z91018 Allergy to other foods: Secondary | ICD-10-CM

## 2024-08-21 DIAGNOSIS — I213 ST elevation (STEMI) myocardial infarction of unspecified site: Secondary | ICD-10-CM | POA: Diagnosis not present

## 2024-08-21 DIAGNOSIS — I2102 ST elevation (STEMI) myocardial infarction involving left anterior descending coronary artery: Secondary | ICD-10-CM | POA: Diagnosis not present

## 2024-08-21 DIAGNOSIS — Z880 Allergy status to penicillin: Secondary | ICD-10-CM | POA: Diagnosis not present

## 2024-08-21 DIAGNOSIS — E1159 Type 2 diabetes mellitus with other circulatory complications: Secondary | ICD-10-CM | POA: Diagnosis not present

## 2024-08-21 DIAGNOSIS — E118 Type 2 diabetes mellitus with unspecified complications: Secondary | ICD-10-CM | POA: Diagnosis not present

## 2024-08-21 DIAGNOSIS — Z7984 Long term (current) use of oral hypoglycemic drugs: Secondary | ICD-10-CM | POA: Diagnosis not present

## 2024-08-21 DIAGNOSIS — R079 Chest pain, unspecified: Secondary | ICD-10-CM | POA: Diagnosis present

## 2024-08-21 LAB — POCT I-STAT, CHEM 8
BUN: 33 mg/dL — ABNORMAL HIGH (ref 8–23)
Calcium, Ion: 1.22 mmol/L (ref 1.15–1.40)
Chloride: 109 mmol/L (ref 98–111)
Creatinine, Ser: 2.2 mg/dL — ABNORMAL HIGH (ref 0.61–1.24)
Glucose, Bld: 160 mg/dL — ABNORMAL HIGH (ref 70–99)
HCT: 35 % — ABNORMAL LOW (ref 39.0–52.0)
Hemoglobin: 11.9 g/dL — ABNORMAL LOW (ref 13.0–17.0)
Potassium: 4.1 mmol/L (ref 3.5–5.1)
Sodium: 141 mmol/L (ref 135–145)
TCO2: 20 mmol/L — ABNORMAL LOW (ref 22–32)

## 2024-08-21 LAB — POCT I-STAT 7, (LYTES, BLD GAS, ICA,H+H)
Acid-base deficit: 4 mmol/L — ABNORMAL HIGH (ref 0.0–2.0)
Bicarbonate: 20.8 mmol/L (ref 20.0–28.0)
Calcium, Ion: 1.21 mmol/L (ref 1.15–1.40)
HCT: 36 % — ABNORMAL LOW (ref 39.0–52.0)
Hemoglobin: 12.2 g/dL — ABNORMAL LOW (ref 13.0–17.0)
O2 Saturation: 96 %
Potassium: 4.1 mmol/L (ref 3.5–5.1)
Sodium: 141 mmol/L (ref 135–145)
TCO2: 22 mmol/L (ref 22–32)
pCO2 arterial: 34.9 mmHg (ref 32–48)
pH, Arterial: 7.382 (ref 7.35–7.45)
pO2, Arterial: 84 mmHg (ref 83–108)

## 2024-08-21 LAB — CG4 I-STAT (LACTIC ACID): Lactic Acid, Venous: 0.5 mmol/L (ref 0.5–1.9)

## 2024-08-21 SURGERY — LEFT HEART CATH AND CORONARY ANGIOGRAPHY
Anesthesia: LOCAL

## 2024-08-21 MED ORDER — VERAPAMIL HCL 2.5 MG/ML IV SOLN
INTRAVENOUS | Status: AC
  Filled 2024-08-21: qty 2

## 2024-08-21 MED ORDER — VERAPAMIL HCL 2.5 MG/ML IV SOLN
INTRAVENOUS | Status: DC | PRN
  Administered 2024-08-21: 10 mL via INTRA_ARTERIAL

## 2024-08-21 MED ORDER — HEPARIN SODIUM (PORCINE) 1000 UNIT/ML IJ SOLN
INTRAMUSCULAR | Status: DC | PRN
  Administered 2024-08-21: 5000 [IU] via INTRAVENOUS

## 2024-08-21 MED ORDER — HEPARIN SODIUM (PORCINE) 5000 UNIT/ML IJ SOLN
4000.0000 [IU] | Freq: Once | INTRAMUSCULAR | Status: AC
  Administered 2024-08-21: 4000 [IU] via INTRAVENOUS

## 2024-08-21 MED ORDER — NITROGLYCERIN IN D5W 200-5 MCG/ML-% IV SOLN
INTRAVENOUS | Status: AC
  Filled 2024-08-21: qty 250

## 2024-08-21 MED ORDER — LIDOCAINE HCL (PF) 1 % IJ SOLN
INTRAMUSCULAR | Status: AC
Start: 2024-08-21 — End: 2024-08-21
  Filled 2024-08-21: qty 30

## 2024-08-21 MED ORDER — HEPARIN SODIUM (PORCINE) 1000 UNIT/ML IJ SOLN
INTRAMUSCULAR | Status: AC
  Filled 2024-08-21: qty 10

## 2024-08-21 MED ORDER — LIDOCAINE HCL (PF) 1 % IJ SOLN
INTRAMUSCULAR | Status: DC | PRN
  Administered 2024-08-21: 2 mL

## 2024-08-21 MED ORDER — HEPARIN (PORCINE) IN NACL 1000-0.9 UT/500ML-% IV SOLN
INTRAVENOUS | Status: DC | PRN
  Administered 2024-08-21 (×2): 500 mL

## 2024-08-21 MED ORDER — NITROGLYCERIN IN D5W 200-5 MCG/ML-% IV SOLN: 2.0000 ug/min | INTRAVENOUS | Status: DC

## 2024-08-21 MED ORDER — NITROGLYCERIN IN D5W 200-5 MCG/ML-% IV SOLN
INTRAVENOUS | Status: AC | PRN
  Administered 2024-08-21: 10 ug/min via INTRAVENOUS

## 2024-08-21 MED ORDER — SODIUM CHLORIDE 0.9 % IV SOLN
INTRAVENOUS | Status: AC | PRN
  Administered 2024-08-21: 10 mL/h via INTRAVENOUS

## 2024-08-21 SURGICAL SUPPLY — 10 items
CATH 5FR JL3.5 JR4 ANG PIG MP (CATHETERS) IMPLANT
CATH VISTA GUIDE 6FR XB3 MULPK (CATHETERS) IMPLANT
DEVICE RAD COMP TR BAND LRG (VASCULAR PRODUCTS) IMPLANT
GLIDESHEATH SLEND SS 6F .021 (SHEATH) IMPLANT
GUIDEWIRE INQWIRE 1.5J.035X260 (WIRE) IMPLANT
KIT ENCORE 26 ADVANTAGE (KITS) IMPLANT
KIT SYRINGE INJ CVI SPIKEX1 (MISCELLANEOUS) IMPLANT
PACK CARDIAC CATHETERIZATION (CUSTOM PROCEDURE TRAY) ×1 IMPLANT
SET ATX-X65L (MISCELLANEOUS) IMPLANT
TUBING CIL FLEX 10 FLL-RA (TUBING) IMPLANT

## 2024-08-21 NOTE — ED Notes (Signed)
 Unable to get pt EKG due to pt being hurriedly taken to cath lab

## 2024-08-21 NOTE — H&P (Signed)
 Cardiology Admission History and Physical   Patient ID: KESTON SEEVER MRN: [id number]; DOB: [birth year]   Admission date: 08/21/2024  PCP:  Cleotilde Oneil FALCON, MD   [facility] Providers Cardiologist:  None       Chief Complaint:  chest pain  Patient Profile: Adam Chung is a 73 y.o. male with T2DM on insulin  (A1C 6.5), HTN, CKD stage 3b, and bladder cancer s/p cystoprostatectomy with ileal conduit who is being seen 08/21/2024 for the evaluation of chest pain in the setting of anterior STEMI.  History of Present Illness: Adam Chung reports that at around 9:30PM, he started to have pressure-like, non-radiating, non-exertional chest pain while sitting associated with left arm numbness. No associated SOB, n/v, diaphoresis, palpitations, pre-syncope, or syncope. He called EMS. Upon their arrival, first EKG showed ST elevation in the V1-3 with reciprocal ST depression. He received aspirin  324 mg; shortly after that, his chest pain resolved, and repeat EKG showed improvement in STE.  Upon his arrival, he was chest pain free. HDS.   Past Medical History:  Diagnosis Date   Cancer [facility])    bladder   Diabetes mellitus without complication [facility])    Past Surgical History:  Procedure Laterality Date   BIOPSY  09/24/2021   Procedure: BIOPSY;  Surgeon: Dianna Specking, MD;  Location: [facility];  Service: Endoscopy;;   BLADDER SURGERY     CYSTOSCOPY N/A 08/09/2021   Procedure: CYSTOSCOPY WITH INSERTION OF FOLEY CATHETER;  Surgeon: Selma Donnice SAUNDERS, MD;  Location: [facility] ORS;  Service: Urology;  Laterality: N/A;   ESOPHAGOGASTRODUODENOSCOPY (EGD) WITH PROPOFOL  N/A 09/24/2021   Procedure: ESOPHAGOGASTRODUODENOSCOPY (EGD) WITH PROPOFOL ;  Surgeon: Dianna Specking, MD;  Location: [facility] ENDOSCOPY;  Service: Endoscopy;  Laterality: N/A;   fatty tumor excision     LYMPH NODE DISSECTION Bilateral 11/27/2021   Procedure: PELVIC LYMPH NODE DISSECTION;  Surgeon: Alvaro Hummer, MD;   Location: [facility] ORS;  Service: Urology;  Laterality: Bilateral;   ROBOT ASSISTED LAPAROSCOPIC COMPLETE CYSTECT ILEAL CONDUIT N/A 11/27/2021   Procedure: XI ROBOTIC ASSISTED LAPAROSCOPIC COMPLETE CYSTECT ILEAL CONDUIT AND INDOCYANINE GREEN  DYE INJECTION;  Surgeon: Alvaro Hummer, MD;  Location: [facility];  Service: Urology;  Laterality: N/A;   ROBOT ASSISTED LAPAROSCOPIC RADICAL PROSTATECTOMY N/A 11/27/2021   Procedure: XI ROBOTIC ASSISTED LAPAROSCOPIC RADICAL PROSTATECTOMY;  Surgeon: Alvaro Hummer, MD;  Location: [facility];  Service: Urology;  Laterality: N/A;   TRANSURETHRAL RESECTION OF PROSTATE N/A 08/09/2021   Procedure: TRANSURETHRAL RESECTION OF THE PROSTATE (TURP);  Surgeon: Selma Donnice SAUNDERS, MD;  Location: [facility] ORS;  Service: Urology;  Laterality: N/A;     Medications Prior to Admission: Prior to Admission medications   Medication Sig Start Date End Date Taking? Authorizing Provider  amLODipine  (NORVASC ) 5 MG tablet Take 1 tablet (5 mg total) by mouth daily for 7 days. 09/10/22 09/17/22  Patsy Lenis, MD  Ensure Max Protein (ENSURE MAX PROTEIN) LIQD Take 330 mLs (11 oz total) by mouth 2 (two) times daily. Patient not taking: Reported on 11/18/2021 09/29/21   Sebastian Toribio GAILS, MD  glimepiride (AMARYL) 4 MG tablet Take 4 mg by mouth daily with breakfast.    [provider]  insulin  aspart (NOVOLOG ) 100 UNIT/ML FlexPen Inject 4 Units into the skin 3 (three) times daily with meals. Give only if patient consumes > 50% of meals. Patient not taking: Reported on 10/15/2021 09/29/21   Sebastian Toribio GAILS, MD  insulin  detemir (LEVEMIR  FLEXTOUCH) 100 UNIT/ML FlexPen Inject 15 Units into the skin 2 (two)  times daily.    [provider]  Multiple Vitamin (MULTIVITAMIN WITH MINERALS) TABS tablet Take 1 tablet by mouth daily. Patient not taking: Reported on 11/18/2021 09/30/21   Sebastian Toribio GAILS, MD  Nutritional Supplements (,FEEDING SUPPLEMENT, PROSOURCE PLUS) liquid Take 30 mLs by mouth 2  (two) times daily between meals. Patient not taking: Reported on 10/11/2021 09/30/21   Sebastian Toribio GAILS, MD  olmesartan  (BENICAR ) 20 MG tablet Take 1 tablet (20 mg total) by mouth daily. 09/16/22   Patsy Lenis, MD     Allergies:    Allergies  Allergen Reactions   Apple Juice Anaphylaxis   Penicillins Swelling    Swelling of lip per patient  Did it involve swelling of the face/tongue/throat, SOB, or low BP? Yes Did it involve sudden or severe rash/hives, skin peeling, or any reaction on the inside of your mouth or nose? No Did you need to seek medical attention at a hospital or doctor's office? Yes When did it last happen?    childhood   If all above answers are NO, may proceed with cephalosporin use.      Social History:   Social History   Socioeconomic History   Marital status: Single    Spouse name: Not on file   Number of children: Not on file   Years of education: Not on file   Highest education level: Not on file  Occupational History   Not on file  Tobacco Use   Smoking status: Former   Smokeless tobacco: Never  Vaping Use   Vaping status: Never Used  Substance and Sexual Activity   Alcohol use: Not Currently   Drug use: Never   Sexual activity: Not on file  Other Topics Concern   Not on file  Social History Narrative   Not on file   Social Drivers of Health   Financial Resource Strain: Low Risk  (12/30/2023)   Received from [facility]   Overall Financial Resource Strain (CARDIA)    Difficulty of Paying Living Expenses: Not hard at all  Food Insecurity: No Food Insecurity (12/30/2023)   [facility] from [facility]   Hunger Vital Sign    Within the past 12 months, you worried that your food would run out before you got the money to buy more.: Never true    Within the past 12 months, the food you bought just didn't last and you didn't have money to get more.: Never true  Transportation Needs: No Transportation Needs  (12/30/2023)   Received from [facility] - Transportation    In the past 12 months, has lack of transportation kept you from medical appointments or from getting medications?: No    Lack of Transportation (Non-Medical): No  Physical Activity: Not on file  Stress: Not on file  Social Connections: Not on file  Intimate Partner Violence: Not on file     Family History:   The patient's family history is negative for Heart disease.    ROS:  Please see the history of present illness.  All other ROS reviewed and negative.     Physical Exam/Data: Vitals:   08/21/24 2300 08/21/24 2331  SpO2:  98%  Weight: 84.8 kg   Height: 5' 11 (1.803 m)    No intake or output data in the 24 hours ending 08/21/24 2333    08/21/2024   11:00 PM 09/07/2022    4:37 AM 09/06/2022    4:59 PM  Last  3 Weights  Weight (lbs) 187 lb 173 lb 4.5 oz 175 lb  Weight (kg) 84.823 kg 78.6 kg 79.379 kg     Body mass index is 26.08 kg/m.  General:  Well nourished, well developed, in no acute distress HEENT: normal Neck: no JVD Vascular: Distal pulses 2+ bilaterally   Cardiac:  normal S1, S2; RRR; no murmur  Lungs:  clear to auscultation bilaterally, no wheezing, rhonchi or rales  Abd: soft, nontender, ileostomy in place Ext: no edema Musculoskeletal:  No deformities, BUE and BLE strength normal and equal Skin: warm and dry  Neuro:  CNs 2-12 intact, no focal abnormalities noted Psych:  Normal affect   EKG:  The ECG that with EMS was personally reviewed and demonstrates acute anterior STEMI (STE elevation in V1-V3 with reciprocal ST depression).  Relevant CV Studies: TTE 08/2021:   1. Technically difficult echo with poor image quality.   2. Left ventricular ejection fraction, by estimation, is 60 to 65%. The  left ventricle has normal function. The left ventricle has no regional  wall motion abnormalities. Left ventricular diastolic parameters were  normal.   3. Right  ventricular systolic function is mildly reduced. The right  ventricular size is normal.   4. The mitral valve is grossly normal. No evidence of mitral valve  regurgitation.   5. The aortic valve is normal in structure. Aortic valve regurgitation is  not visualized. No aortic stenosis is present.   Laboratory Data: High Sensitivity Troponin:  No results for input(s): TROPONINIHS in the last 720 hours.    ChemistryNo results for input(s): NA, K, CL, CO2, GLUCOSE, BUN, CREATININE, CALCIUM , MG, GFRNONAA, GFRAA, ANIONGAP in the last 168 hours.  No results for input(s): PROT, ALBUMIN , AST, ALT, ALKPHOS, BILITOT in the last 168 hours. Lipids No results for input(s): CHOL, TRIG, HDL, LABVLDL, LDLCALC, CHOLHDL in the last 168 hours. HematologyNo results for input(s): WBC, RBC, HGB, HCT, MCV, MCH, MCHC, RDW, PLT in the last 168 hours. Thyroid No results for input(s): TSH, FREET4 in the last 168 hours. BNPNo results for input(s): BNP, PROBNP in the last 168 hours.  DDimer No results for input(s): DDIMER in the last 168 hours.  Radiology/Studies:  No results found.   Assessment and Plan: Anterior STEMI Chest pain associated with STE in V1-V3 with reciprocal changes on first EKG with EMS; symptoms and STE improved following EMS arrival and ASA administration, likely due to some reflow.  - To the cath lab for emergent cath and likely primary PCI - TTE - LDL 119 in 06/2024; not on statin therapy. As per pharmacy notes, he failed pravastatin and simvastatin due to myalgia. Start Zetia. Consider PCSK9i.  - Cardiac rehab referral   2. T2DM - A1C 6.5 in 06/2024; well controlled on insulin  and Ozempic   3. HTN - Continue home amlodipine  5 mg daily; will adjust therapy based on LVEF   Risk Assessment/Risk Scores:   TIMI Risk Score for ST  Elevation MI:   The patient's TIMI risk score is 4, which indicates a 7.3% risk of  all cause mortality at 30 days.      Code Status: Full Code  Severity of Illness: The appropriate patient status for this patient is INPATIENT. Inpatient status is judged to be reasonable and necessary in order to provide the required intensity of service to ensure the patient's safety. The patient's presenting symptoms, physical exam findings, and initial radiographic and laboratory data in the context of their chronic comorbidities is felt to  place them at high risk for further clinical deterioration. Furthermore, it is not anticipated that the patient will be medically stable for discharge from the hospital within 2 midnights of admission.   * I certify that at the point of admission it is my clinical judgment that the patient will require inpatient hospital care spanning beyond 2 midnights from the point of admission due to high intensity of service, high risk for further deterioration and high frequency of surveillance required.*  For questions or updates, please contact [facility] Please consult [email or web address] for contact info under       Signed, Gillian CHRISTELLA Cass, MD  08/21/2024 11:33 PM

## 2024-08-21 NOTE — ED Triage Notes (Signed)
 Pt bibgcems for stemi, no current complaints of chest pain. Will go to cath lab

## 2024-08-21 NOTE — Progress Notes (Signed)
 PHARMACY - ANTICOAGULATION CONSULT NOTE  Pharmacy Consult for heparin  Indication: {Indications:3041533}  Allergies  Allergen Reactions   Apple Juice Anaphylaxis   Penicillins Swelling    Swelling of lip per patient  Did it involve swelling of the face/tongue/throat, SOB, or low BP? Yes Did it involve sudden or severe rash/hives, skin peeling, or any reaction on the inside of your mouth or nose? No Did you need to seek medical attention at a hospital or doctor's office? Yes When did it last happen?    childhood   If all above answers are NO, may proceed with cephalosporin use.      Patient Measurements: Height: 5' 11 (180.3 cm) Weight: 84.8 kg (187 lb) IBW/kg (Calculated) : 75.3 HEPARIN  DW (KG): 84.8  Vital Signs:    Labs: Recent Labs    08/21/24 2338 08/21/24 2339  HGB 11.9* 12.2*  HCT 35.0* 36.0*  CREATININE 2.20*  --     Estimated Creatinine Clearance: 31.9 mL/min (A) (by C-G formula based on SCr of 2.2 mg/dL (H)).   Medical History: Past Medical History:  Diagnosis Date   Cancer (HCC)    bladder   Diabetes mellitus without complication (HCC)     Medications:  {Meds:3041526:p}  Assessment: *** Goal of Therapy:  {Goals:3041534} {Monitor platelets by anticoagulation protocol:3041561::Monitor platelets by anticoagulation protocol: Yes}   Plan:  {Plan:3041500}  Thank you for involving pharmacy in this patient's care.  Delon Sax, PharmD, BCPS Clinical Pharmacist 08/21/2024 11:57 PM

## 2024-08-21 NOTE — ED Notes (Signed)
 Pt transported to cath lab.

## 2024-08-22 ENCOUNTER — Telehealth (HOSPITAL_COMMUNITY): Payer: Self-pay | Admitting: Pharmacy Technician

## 2024-08-22 ENCOUNTER — Inpatient Hospital Stay (HOSPITAL_COMMUNITY)

## 2024-08-22 ENCOUNTER — Encounter (HOSPITAL_COMMUNITY): Payer: Self-pay | Admitting: Cardiology

## 2024-08-22 ENCOUNTER — Other Ambulatory Visit (HOSPITAL_COMMUNITY): Payer: Self-pay

## 2024-08-22 DIAGNOSIS — I2102 ST elevation (STEMI) myocardial infarction involving left anterior descending coronary artery: Secondary | ICD-10-CM | POA: Diagnosis not present

## 2024-08-22 DIAGNOSIS — R079 Chest pain, unspecified: Secondary | ICD-10-CM | POA: Diagnosis not present

## 2024-08-22 DIAGNOSIS — Z794 Long term (current) use of insulin: Secondary | ICD-10-CM

## 2024-08-22 DIAGNOSIS — N1832 Chronic kidney disease, stage 3b: Secondary | ICD-10-CM

## 2024-08-22 DIAGNOSIS — E1122 Type 2 diabetes mellitus with diabetic chronic kidney disease: Secondary | ICD-10-CM | POA: Diagnosis not present

## 2024-08-22 DIAGNOSIS — E118 Type 2 diabetes mellitus with unspecified complications: Secondary | ICD-10-CM | POA: Diagnosis not present

## 2024-08-22 DIAGNOSIS — Z01810 Encounter for preprocedural cardiovascular examination: Secondary | ICD-10-CM | POA: Diagnosis not present

## 2024-08-22 DIAGNOSIS — I2510 Atherosclerotic heart disease of native coronary artery without angina pectoris: Secondary | ICD-10-CM

## 2024-08-22 DIAGNOSIS — E1159 Type 2 diabetes mellitus with other circulatory complications: Secondary | ICD-10-CM | POA: Diagnosis not present

## 2024-08-22 DIAGNOSIS — I129 Hypertensive chronic kidney disease with stage 1 through stage 4 chronic kidney disease, or unspecified chronic kidney disease: Secondary | ICD-10-CM

## 2024-08-22 LAB — CBC
HCT: 34 % — ABNORMAL LOW (ref 39.0–52.0)
HCT: 39.6 % (ref 39.0–52.0)
Hemoglobin: 11.7 g/dL — ABNORMAL LOW (ref 13.0–17.0)
Hemoglobin: 13.2 g/dL (ref 13.0–17.0)
MCH: 29.7 pg (ref 26.0–34.0)
MCH: 30.5 pg (ref 26.0–34.0)
MCHC: 33.3 g/dL (ref 30.0–36.0)
MCHC: 34.4 g/dL (ref 30.0–36.0)
MCV: 88.5 fL (ref 80.0–100.0)
MCV: 89.2 fL (ref 80.0–100.0)
Platelets: 184 K/uL (ref 150–400)
Platelets: 201 K/uL (ref 150–400)
RBC: 3.84 MIL/uL — ABNORMAL LOW (ref 4.22–5.81)
RBC: 4.44 MIL/uL (ref 4.22–5.81)
RDW: 13 % (ref 11.5–15.5)
RDW: 13 % (ref 11.5–15.5)
WBC: 8.4 K/uL (ref 4.0–10.5)
WBC: 9.2 K/uL (ref 4.0–10.5)
nRBC: 0 % (ref 0.0–0.2)
nRBC: 0 % (ref 0.0–0.2)

## 2024-08-22 LAB — COMPREHENSIVE METABOLIC PANEL WITH GFR
ALT: 18 U/L (ref 0–44)
AST: 25 U/L (ref 15–41)
Albumin: 3.9 g/dL (ref 3.5–5.0)
Alkaline Phosphatase: 54 U/L (ref 38–126)
Anion gap: 14 (ref 5–15)
BUN: 34 mg/dL — ABNORMAL HIGH (ref 8–23)
CO2: 19 mmol/L — ABNORMAL LOW (ref 22–32)
Calcium: 8.8 mg/dL — ABNORMAL LOW (ref 8.9–10.3)
Chloride: 106 mmol/L (ref 98–111)
Creatinine, Ser: 2.16 mg/dL — ABNORMAL HIGH (ref 0.61–1.24)
GFR, Estimated: 32 mL/min — ABNORMAL LOW (ref >60)
Glucose, Bld: 158 mg/dL — ABNORMAL HIGH (ref 70–99)
Potassium: 4.1 mmol/L (ref 3.5–5.1)
Sodium: 139 mmol/L (ref 135–145)
Total Bilirubin: 1 mg/dL (ref 0.0–1.2)
Total Protein: 6.5 g/dL (ref 6.5–8.1)

## 2024-08-22 LAB — TROPONIN I (HIGH SENSITIVITY)
Troponin I (High Sensitivity): 166 ng/L (ref <18)
Troponin I (High Sensitivity): 3315 ng/L (ref <18)
Troponin I (High Sensitivity): 3912 ng/L (ref <18)

## 2024-08-22 LAB — ECHOCARDIOGRAM COMPLETE
AR max vel: 2.85 cm2
AV Area VTI: 3.25 cm2
AV Area mean vel: 2.46 cm2
AV Mean grad: 4 mmHg
AV Peak grad: 4.8 mmHg
Ao pk vel: 1.1 m/s
Area-P 1/2: 2.48 cm2
Height: 71 in
S' Lateral: 3 cm
Weight: 3008.84 [oz_av]

## 2024-08-22 LAB — POCT ACTIVATED CLOTTING TIME: Activated Clotting Time: 256 s

## 2024-08-22 LAB — MAGNESIUM: Magnesium: 1.7 mg/dL (ref 1.7–2.4)

## 2024-08-22 LAB — BASIC METABOLIC PANEL WITH GFR
Anion gap: 11 (ref 5–15)
BUN: 34 mg/dL — ABNORMAL HIGH (ref 8–23)
CO2: 24 mmol/L (ref 22–32)
Calcium: 9.1 mg/dL (ref 8.9–10.3)
Chloride: 103 mmol/L (ref 98–111)
Creatinine, Ser: 2.34 mg/dL — ABNORMAL HIGH (ref 0.61–1.24)
GFR, Estimated: 29 mL/min — ABNORMAL LOW (ref >60)
Glucose, Bld: 108 mg/dL — ABNORMAL HIGH (ref 70–99)
Potassium: 4.4 mmol/L (ref 3.5–5.1)
Sodium: 138 mmol/L (ref 135–145)

## 2024-08-22 LAB — GLUCOSE, CAPILLARY
Glucose-Capillary: 99 mg/dL (ref 70–99)
Glucose-Capillary: 76 mg/dL (ref 70–99)
Glucose-Capillary: 105 mg/dL — ABNORMAL HIGH (ref 70–99)
Glucose-Capillary: 81 mg/dL (ref 70–99)

## 2024-08-22 LAB — TYPE AND SCREEN
ABO/RH(D): O POS
Antibody Screen: NEGATIVE

## 2024-08-22 LAB — MRSA NEXT GEN BY PCR, NASAL: MRSA by PCR Next Gen: NOT DETECTED

## 2024-08-22 LAB — HELIX PHARMACOGENOMICS (PGX) CLOPIDOGREL TEST

## 2024-08-22 LAB — HEPARIN LEVEL (UNFRACTIONATED): Heparin Unfractionated: 0.28 [IU]/mL — ABNORMAL LOW (ref 0.30–0.70)

## 2024-08-22 LAB — HEMOGLOBIN A1C
Hgb A1c MFr Bld: 5.9 % — ABNORMAL HIGH (ref 4.8–5.6)
Mean Plasma Glucose: 122.63 mg/dL

## 2024-08-22 LAB — PROTIME-INR
INR: 1.1 (ref 0.8–1.2)
Prothrombin Time: 14.9 s (ref 11.4–15.2)

## 2024-08-22 MED ORDER — HEPARIN 30,000 UNITS/1000 ML (OHS) CELLSAVER SOLUTION
Status: DC
  Filled 2024-08-22: qty 1000

## 2024-08-22 MED ORDER — METOPROLOL TARTRATE 12.5 MG HALF TABLET
12.5000 mg | ORAL_TABLET | Freq: Once | ORAL | Status: AC
  Administered 2024-08-23: 12.5 mg via ORAL
  Filled 2024-08-22: qty 1

## 2024-08-22 MED ORDER — PHENYLEPHRINE HCL-NACL 20-0.9 MG/250ML-% IV SOLN
30.0000 ug/min | INTRAVENOUS | Status: AC
  Administered 2024-08-23: 30 ug/min via INTRAVENOUS
  Filled 2024-08-22: qty 250

## 2024-08-22 MED ORDER — CHLORHEXIDINE GLUCONATE CLOTH 2 % EX PADS
6.0000 | MEDICATED_PAD | Freq: Once | CUTANEOUS | Status: AC
  Administered 2024-08-22: 6 via TOPICAL

## 2024-08-22 MED ORDER — AMLODIPINE BESYLATE 5 MG PO TABS
5.0000 mg | ORAL_TABLET | Freq: Every day | ORAL | Status: DC
  Administered 2024-08-22: 5 mg via ORAL
  Filled 2024-08-22: qty 1

## 2024-08-22 MED ORDER — INSULIN GLARGINE 100 UNIT/ML ~~LOC~~ SOLN
15.0000 [IU] | Freq: Every day | SUBCUTANEOUS | Status: DC
  Administered 2024-08-22: 15 [IU] via SUBCUTANEOUS
  Filled 2024-08-22 (×2): qty 0.15

## 2024-08-22 MED ORDER — HEPARIN (PORCINE) 25000 UT/250ML-% IV SOLN
1300.0000 [IU]/h | INTRAVENOUS | Status: DC
  Administered 2024-08-22: 1200 [IU]/h via INTRAVENOUS
  Administered 2024-08-23: 1300 [IU]/h via INTRAVENOUS
  Filled 2024-08-22 (×2): qty 250

## 2024-08-22 MED ORDER — SODIUM CHLORIDE 0.9 % IV SOLN: 250.0000 mL | INTRAVENOUS | Status: DC | PRN

## 2024-08-22 MED ORDER — INSULIN DETEMIR 100 UNIT/ML FLEXPEN: 15.0000 [IU] | PEN_INJECTOR | Freq: Two times a day (BID) | SUBCUTANEOUS | Status: DC

## 2024-08-22 MED ORDER — CHLORHEXIDINE GLUCONATE 0.12 % MT SOLN
15.0000 mL | Freq: Once | OROMUCOSAL | Status: AC
  Administered 2024-08-23: 15 mL via OROMUCOSAL
  Filled 2024-08-22: qty 15

## 2024-08-22 MED ORDER — ASPIRIN 81 MG PO TBEC: 81.0000 mg | DELAYED_RELEASE_TABLET | Freq: Every day | ORAL | Status: DC

## 2024-08-22 MED ORDER — NITROGLYCERIN 0.4 MG SL SUBL: 0.4000 mg | SUBLINGUAL_TABLET | SUBLINGUAL | Status: DC | PRN

## 2024-08-22 MED ORDER — DEXMEDETOMIDINE HCL IN NACL 400 MCG/100ML IV SOLN
0.1000 ug/kg/h | INTRAVENOUS | Status: AC
  Administered 2024-08-23: .3 ug/kg/h via INTRAVENOUS
  Filled 2024-08-22: qty 100

## 2024-08-22 MED ORDER — ROSUVASTATIN CALCIUM 20 MG PO TABS
20.0000 mg | ORAL_TABLET | Freq: Every day | ORAL | Status: DC
Start: 2024-08-22 — End: 2024-08-23
  Administered 2024-08-22: 20 mg via ORAL
  Filled 2024-08-22: qty 1

## 2024-08-22 MED ORDER — MAGNESIUM SULFATE 4 GM/100ML IV SOLN
4.0000 g | Freq: Once | INTRAVENOUS | Status: AC
  Administered 2024-08-22: 4 g via INTRAVENOUS
  Filled 2024-08-22: qty 100

## 2024-08-22 MED ORDER — ENOXAPARIN SODIUM 40 MG/0.4ML IJ SOSY
40.0000 mg | PREFILLED_SYRINGE | INTRAMUSCULAR | Status: DC
Start: 2024-08-22 — End: 2024-08-22

## 2024-08-22 MED ORDER — ONDANSETRON HCL 4 MG/2ML IJ SOLN: 4.0000 mg | Freq: Four times a day (QID) | INTRAMUSCULAR | Status: DC | PRN

## 2024-08-22 MED ORDER — VANCOMYCIN HCL 1.5 G IV SOLR
1500.0000 mg | INTRAVENOUS | Status: AC
  Administered 2024-08-23: 1500 mg via INTRAVENOUS
  Filled 2024-08-22: qty 30

## 2024-08-22 MED ORDER — TEMAZEPAM 15 MG PO CAPS: 15.0000 mg | ORAL_CAPSULE | Freq: Once | ORAL | Status: DC | PRN

## 2024-08-22 MED ORDER — MILRINONE LACTATE IN DEXTROSE 20-5 MG/100ML-% IV SOLN
0.3000 ug/kg/min | INTRAVENOUS | Status: DC
  Filled 2024-08-22: qty 100

## 2024-08-22 MED ORDER — FUROSEMIDE 10 MG/ML IJ SOLN: 40.0000 mg | Freq: Once | INTRAMUSCULAR | Status: DC

## 2024-08-22 MED ORDER — INSULIN ASPART 100 UNIT/ML IJ SOLN: 0.0000 [IU] | Freq: Three times a day (TID) | INTRAMUSCULAR | Status: DC

## 2024-08-22 MED ORDER — SODIUM CHLORIDE 0.9% FLUSH
3.0000 mL | Freq: Two times a day (BID) | INTRAVENOUS | Status: DC
  Administered 2024-08-22 (×3): 3 mL via INTRAVENOUS

## 2024-08-22 MED ORDER — INSULIN REGULAR(HUMAN) IN NACL 100-0.9 UT/100ML-% IV SOLN
INTRAVENOUS | Status: AC
  Administered 2024-08-23: .6 [IU]/h via INTRAVENOUS
  Filled 2024-08-22: qty 100

## 2024-08-22 MED ORDER — IOHEXOL 350 MG/ML SOLN
INTRAVENOUS | Status: DC | PRN
  Administered 2024-08-22: 50 mL

## 2024-08-22 MED ORDER — ASPIRIN 81 MG PO CHEW
81.0000 mg | CHEWABLE_TABLET | Freq: Every day | ORAL | Status: DC
  Administered 2024-08-22: 81 mg via ORAL
  Filled 2024-08-22: qty 1

## 2024-08-22 MED ORDER — TRANEXAMIC ACID 1000 MG/10ML IV SOLN
1.5000 mg/kg/h | INTRAVENOUS | Status: AC
  Administered 2024-08-23: 1.5 mg/kg/h via INTRAVENOUS
  Filled 2024-08-22: qty 25

## 2024-08-22 MED ORDER — TRANEXAMIC ACID (OHS) BOLUS VIA INFUSION
15.0000 mg/kg | INTRAVENOUS | Status: AC
  Administered 2024-08-23: 1279.5 mg via INTRAVENOUS
  Filled 2024-08-22: qty 1280

## 2024-08-22 MED ORDER — NITROGLYCERIN IN D5W 200-5 MCG/ML-% IV SOLN
2.0000 ug/min | INTRAVENOUS | Status: AC
  Administered 2024-08-23: 10 ug/min via INTRAVENOUS
  Filled 2024-08-22: qty 250

## 2024-08-22 MED ORDER — POTASSIUM CHLORIDE 2 MEQ/ML IV SOLN
80.0000 meq | INTRAVENOUS | Status: DC
  Filled 2024-08-22: qty 40

## 2024-08-22 MED ORDER — ROSUVASTATIN CALCIUM 20 MG PO TABS: 20.0000 mg | ORAL_TABLET | Freq: Every day | ORAL | Status: DC

## 2024-08-22 MED ORDER — CEFAZOLIN SODIUM-DEXTROSE 2-4 GM/100ML-% IV SOLN
2.0000 g | INTRAVENOUS | Status: AC
  Administered 2024-08-23 (×2): 2 g via INTRAVENOUS
  Filled 2024-08-22: qty 100

## 2024-08-22 MED ORDER — NOREPINEPHRINE 4 MG/250ML-% IV SOLN
0.0000 ug/min | INTRAVENOUS | Status: DC
Start: 2024-08-23 — End: 2024-08-24
  Filled 2024-08-22: qty 250

## 2024-08-22 MED ORDER — EPINEPHRINE HCL 5 MG/250ML IV SOLN IN NS
0.0000 ug/min | INTRAVENOUS | Status: DC
  Filled 2024-08-22: qty 250

## 2024-08-22 MED ORDER — ACETAMINOPHEN 325 MG PO TABS: 650.0000 mg | ORAL_TABLET | ORAL | Status: DC | PRN

## 2024-08-22 MED ORDER — CEFAZOLIN SODIUM-DEXTROSE 2-4 GM/100ML-% IV SOLN
2.0000 g | INTRAVENOUS | Status: DC
  Filled 2024-08-22: qty 100

## 2024-08-22 MED ORDER — FREE WATER
250.0000 mL | Freq: Once | Status: AC
Start: 2024-08-22 — End: 2024-08-22
  Administered 2024-08-22: 250 mL via ORAL

## 2024-08-22 MED ORDER — HYDRALAZINE HCL 20 MG/ML IJ SOLN: 10.0000 mg | INTRAMUSCULAR | Status: AC | PRN

## 2024-08-22 MED ORDER — CHLORHEXIDINE GLUCONATE CLOTH 2 % EX PADS
6.0000 | MEDICATED_PAD | Freq: Every day | CUTANEOUS | Status: DC
  Administered 2024-08-22 (×2): 6 via TOPICAL

## 2024-08-22 MED ORDER — NITROGLYCERIN IN D5W 200-5 MCG/ML-% IV SOLN: 0.0000 ug/min | INTRAVENOUS | Status: DC

## 2024-08-22 MED ORDER — MAGNESIUM SULFATE 50 % IJ SOLN
40.0000 meq | INTRAMUSCULAR | Status: DC
  Filled 2024-08-22: qty 9.85

## 2024-08-22 MED ORDER — FUROSEMIDE 10 MG/ML IJ SOLN
40.0000 mg | Freq: Once | INTRAMUSCULAR | Status: AC
  Administered 2024-08-22: 40 mg via INTRAVENOUS
  Filled 2024-08-22: qty 4

## 2024-08-22 MED ORDER — SODIUM CHLORIDE 0.9% FLUSH: 3.0000 mL | INTRAVENOUS | Status: DC | PRN

## 2024-08-22 MED ORDER — BISACODYL 5 MG PO TBEC: 5.0000 mg | DELAYED_RELEASE_TABLET | Freq: Once | ORAL | Status: DC

## 2024-08-22 MED ORDER — PLASMA-LYTE A IV SOLN
INTRAVENOUS | Status: DC
  Filled 2024-08-22: qty 2.5

## 2024-08-22 MED ORDER — CHLORHEXIDINE GLUCONATE CLOTH 2 % EX PADS
6.0000 | MEDICATED_PAD | Freq: Once | CUTANEOUS | Status: AC
  Administered 2024-08-23: 6 via TOPICAL

## 2024-08-22 MED ORDER — TRANEXAMIC ACID (OHS) PUMP PRIME SOLUTION
2.0000 mg/kg | INTRAVENOUS | Status: DC
  Filled 2024-08-22: qty 1.71

## 2024-08-22 NOTE — H&P (View-Only) (Signed)
 [address]       [place] [address]             [phone]        Adam Chung [facility] Medical Record #[id number] Date of Birth: [birth year]  Referring: Dr. Elmira, MD Primary Care: Cleotilde Oneil FALCON, MD Primary Cardiologist:None  Chief Complaint:   Left arm and chest pain Reason for consultation: Coronary artery disease   History of Present Illness:     This is a 73 year old male with a past medical history of hypertension, diabetes mellitus, CKD (stage IIIb), very remote tobacco use (quit over 35 years ago), bladder cancer (s/p cystoprostatectomy with ileal conduit) who presented to [facility] ED via EMS with complaints of chest pain on 08/21/2024. 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   EKG showed ST elevation in leads V1-3 with reciprocal ST depression. Initial Troponin I (high sensitivity) was 166 and max thus far is 3,912. He ruled in for a STEMI.  Cardiac catheterization done 08/21/2024 showed mid LAD with an 85% stenosis, 3rd Diagonal with an 80% stenosis, mid to distal Circumflex with a 90%stenosis, and OM3 with a 100% stenosis. Echocardiogram has done earlier today showed LVEF 40-45%, regional wall motion abnormalities (akinesis of the distal septum, distal inferior wall and apex), trivial MR and TR, and cannot rule out apical thrombus.Cardiothoracic surgery has been consulted for consideration of coronary artery bypass grafting surgery. At the time of my exam, patient denied arm pain, chest pain or shortness of breath.   Patient lives alone. 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 He has a supportive daughter.  His daughter and son in law at bedside.  Current Activity/ Functional Status: Patient is independent with  mobility/ambulation, transfers, ADL's, IADL's.   Zubrod Score: At the time of surgery this patient's most appropriate activity status/level should be described as: []     0    Normal activity, no symptoms [x]     1    Restricted in physical strenuous activity but ambulatory, able to do out light work []     2    Ambulatory and capable of self care, unable to do work activities, up and about more than 50%  Of the time                            []     3    Only limited self care, in bed greater than 50% of waking hours []     4    Completely disabled, no self care, confined to bed or chair []     5    Moribund  Past Medical History:  Diagnosis Date   Cancer (HCC)    bladder   Diabetes mellitus without complication (HCC)   Hypertension  Past Surgical History:  Procedure Laterality Date   BIOPSY  09/24/2021   Procedure: BIOPSY;  Surgeon: Dianna Specking, MD;  Location: [facility];  Service: Endoscopy;;   BLADDER SURGERY     CYSTOSCOPY N/A 08/09/2021   Procedure: CYSTOSCOPY WITH INSERTION OF FOLEY CATHETER;  Surgeon: Selma Donnice SAUNDERS, MD;  Location: [facility] ORS;  Service: Urology;  Laterality: N/A;   ESOPHAGOGASTRODUODENOSCOPY (  EGD) WITH PROPOFOL  N/A 09/24/2021   Procedure: ESOPHAGOGASTRODUODENOSCOPY (EGD) WITH PROPOFOL ;  Surgeon: Dianna Specking, MD;  Location: [facility];  Service: Endoscopy;  Laterality: N/A;   fatty tumor excision     LEFT HEART CATH AND CORONARY ANGIOGRAPHY N/A 08/21/2024   Procedure: LEFT HEART CATH AND CORONARY ANGIOGRAPHY;  Surgeon: Swaziland, Peter M, MD;  Location: [facility] INVASIVE CV LAB;  Service: Cardiovascular;  Laterality: N/A;   LYMPH NODE DISSECTION Bilateral 11/27/2021   Procedure: PELVIC LYMPH NODE DISSECTION;  Surgeon: Alvaro Hummer, MD;  Location: [facility];  Service: Urology;  Laterality: Bilateral;   ROBOT ASSISTED LAPAROSCOPIC COMPLETE CYSTECT ILEAL CONDUIT N/A 11/27/2021   Procedure: XI ROBOTIC ASSISTED LAPAROSCOPIC COMPLETE CYSTECT ILEAL CONDUIT AND  INDOCYANINE GREEN  DYE INJECTION;  Surgeon: Alvaro Hummer, MD;  Location: [facility];  Service: Urology;  Laterality: N/A;   ROBOT ASSISTED LAPAROSCOPIC RADICAL PROSTATECTOMY N/A 11/27/2021   Procedure: XI ROBOTIC ASSISTED LAPAROSCOPIC RADICAL PROSTATECTOMY;  Surgeon: Alvaro Hummer, MD;  Location: [facility];  Service: Urology;  Laterality: N/A;   TRANSURETHRAL RESECTION OF PROSTATE N/A 08/09/2021   Procedure: TRANSURETHRAL RESECTION OF THE PROSTATE (TURP);  Surgeon: Selma Donnice SAUNDERS, MD;  Location: [facility] ORS;  Service: Urology;  Laterality: N/A;    Social History   Tobacco Use  Smoking Status Former. He quit 35 years ago  Smokeless Tobacco Never    Social History   Substance and Sexual Activity  Alcohol Use Not Currently     Allergies  Allergen Reactions   Apple Juice Anaphylaxis   Benicar  [Olmesartan ] Other (See Comments)    Acute kidney injury   Penicillins Swelling    Angioedema     Current Facility-Administered Medications  Medication Dose Route Frequency Provider Last Rate Last Admin   0.9 %  sodium chloride  infusion  250 mL Intravenous PRN Swaziland, Peter M, MD 10 mL/hr at 08/22/24 1026 Infusion Verify at 08/22/24 1026   acetaminophen  (TYLENOL ) tablet 650 mg  650 mg Oral Q4H PRN [place]az[place], Peter M, MD       amLODipine  (NORVASC ) tablet 5 mg  5 mg Oral Daily Salah, Husam M, MD   5 mg at 08/22/24 9056   aspirin  chewable tablet 81 mg  81 mg Oral Daily Swaziland, Peter M, MD   81 mg at 08/22/24 9056   Chlorhexidine  Gluconate Cloth 2 % PADS 6 each  6 each Topical Daily [place]azil[place], Peter M, MD   6 each at 08/22/24 0947   furosemide  (LASIX ) injection 40 mg  40 mg Intravenous Once Jordan, Peter M, MD       heparin  ADULT infusion 100 units/mL (25000 units/250mL)  1,200 Units/hr Intravenous Continuous Bari Delon BIRCH, [place] 12 mL/hr at 08/22/24 1026 1,200 Units/hr at 08/22/24 1026   insulin  aspart (novoLOG ) injection 0-15 Units  0-15 Units Subcutaneous TID WC Salah, Husam M, MD       insulin  glargine  (LANTUS ) injection 15 Units  15 Units Subcutaneous Daily Patwardhan, Newman PARAS, MD   15 Units at 08/22/24 0943   magnesium  sulfate IVPB 4 g 100 mL  4 g Intravenous Once Patwardhan, Manish J, MD 50 mL/hr at 08/22/24 1153 4 g at 08/22/24 1153   nitroGLYCERIN  (NITROSTAT ) SL tablet 0.4 mg  0.4 mg Sublingual Q5 Min x 3 PRN Salah, Husam M, MD       nitroGLYCERIN  50 mg in dextrose  5 % 250 mL (0.2 mg/mL) infusion  0-200 mcg/min Intravenous Titrated Swaziland, Peter M, MD 3 mL/hr at 08/22/24 1026 10 mcg/min at 08/22/24 1026  ondansetron  (ZOFRAN ) injection 4 mg  4 mg Intravenous Q6H PRN [place], Peter M, MD       rosuvastatin  (CRESTOR ) tablet 20 mg  20 mg Oral Daily Patwardhan, Manish J, MD   20 mg at 08/22/24 0943   sodium chloride  flush (NS) 0.9 % injection 3 mL  3 mL Intravenous Q12H [place]az[place], Peter M, MD   3 mL at 08/22/24 9052   sodium chloride  flush (NS) 0.9 % injection 3 mL  3 mL Intravenous PRN [place], Peter M, MD        Medications Prior to Admission  Medication Sig Dispense Refill Last Dose/Taking   amLODipine  (NORVASC ) 5 MG tablet Take 1 tablet (5 mg total) by mouth daily for 7 days. 7 tablet 0 08/21/2024 Morning   Cholecalciferol (VITAMIN D3) 50 MCG (2000 UT) capsule Take 8,000 Units by mouth daily.   08/21/2024 Morning   ibuprofen (ADVIL) 200 MG tablet Take 400 mg by mouth 2 (two) times daily as needed for headache (pain).   08/21/2024 Evening   insulin  detemir (LEVEMIR  FLEXTOUCH) 100 UNIT/ML FlexPen Inject 32 Units into the skin daily.   08/21/2024 Morning   OVER THE COUNTER MEDICATION Take 1 tablet by mouth daily as needed (allergies, eye irritation). OTC unknown non-drowsy allergy relief, blue tablet   Past Week   Semaglutide,0.25 or 0.5MG /DOS, (OZEMPIC, 0.25 OR 0.5 MG/DOSE,) 2 MG/3ML SOPN Inject 0.5 mg into the skin every Monday.   08/20/2024    Family History  Problem Relation Age of Onset   Heart disease Neg Hx     Review of Systems:    Cardiac Review of Systems: Y or  [ N   ]= no  Chest  Pain [ Y on admission   ]  Exertional SOB  [ N ]     Palpitations [  N] Syncope  [ N ]     General Review of Systems: [Y] = yes [ N ]=no Constitional: nausea [ N ];  fever [  N];  Dental: Last Dentist visit: Recently;had a crown  Eye : Amaurosis fugax[ N ]; Resp: cough Discordia.Diesel  ];  wheezing[N  ];  hemoptysis[ N ]; s GI:  vomiting[ N ];   melena[N  ];  hematochezia [  N]; GU: urostomy [ Y ];             Skin: rash, swelling[N  ];,  peripheral edema[N  ];   Musculosketetal:joint pain[ Y-DJD neck into left arm ];    Heme/Lymph:  anemia[Y  ];  Neuro: TIA[ N ];  h  stroke[ N ];  difficulty walking[ N ];  Endocrine: diabetes[ Y ]               Physical Exam: BP 132/75   Pulse 78   Temp 98.3 F (36.8 C) (Oral)   Resp 13   Ht 5' 11 (1.803 m)   Wt 85.3 kg   SpO2 96%   BMI 26.23 kg/m    General appearance: alert, cooperative, and no distress Head: Normocephalic, without obvious abnormality, atraumatic Neck: no carotid bruit, no JVD, and supple, symmetrical, trachea midline Resp: clear to auscultation bilaterally Cardio: RRR, no murmur GI: Soft, non tender, bowel sounds present. Urostomy on right side of abdomen Extremities: No LE edema. Palpable DP/PT bilaterally Neurologic: Grossly normal  Diagnostic Studies & Laboratory data:     Recent Radiology Findings:   CARDIAC CATHETERIZATION Result Date: 08/22/2024   Prox RCA-1 lesion is 45% stenosed.   Prox RCA-2 lesion  is 20% stenosed.   Mid Cx to Dist Cx lesion is 90% stenosed.   3rd Mrg lesion is 100% stenosed.   Mid LAD lesion is 85% stenosed.   3rd Diag lesion is 80% stenosed.   LV end diastolic pressure is moderately elevated. 2 vessel obstructive CAD. There is a complex bifurcation stenosis involving the LAD and third diagonal branch which is fairly large. Medina class 1,1,0.  There is severe disease in the distal LCx Moderately elevated LVEDP 28 mm Hg Plan: Currently the patient is pain free and hemodynamically stable. Ecg changes have  resolved. He has CKD with baseline creatinine 2.0. I felt it was best at this point to stabilize the patient medically and consider options for revascularization. Will resume IV heparin  and start IV Ntg. Needs some diuresis. Optimize GDMT. Monitor renal function closely and assess LV function with Echo. Options include complex LAD/diagonal bifurcation stenting vs CABG. Will discuss with heart team in am.   Patient Name:   Adam Chung Date of Exam: 08/22/2024  Medical Rec #:  [id number]         Height:       71.0 in  Accession #:    [id number]        Weight:       188.1 lb  Date of Birth:  01/01/1951         BSA:          2.054 m  Patient Age:    73 years          BP:           134/69 mmHg  Patient Gender: M                 HR:           82 bpm.  Exam Location:  Inpatient   Procedure: 2D Echo, Cardiac Doppler and Color Doppler (Both Spectral and  Color            Flow Doppler were utilized during procedure).   Indications:    Chest Pain R07.9    History:        Patient has prior history of Echocardiogram examinations,  most                 recent 09/21/2021. CAD; Risk Factors:Diabetes.    Sonographer:    Jayson Gaskins  Referring Phys: [phone] PETER M SWAZILAND   IMPRESSIONS     1. Akinesis of the distal septum, distal inferior wall and apex with  overall mild LV dysfunction; cannot R/O apical thrombus; suggest cardiac  MRI or repeat limited echo with contrast to further assess.   2. Left ventricular ejection fraction, by estimation, is 40 to 45%. The  left ventricle has mildly decreased function. The left ventricle  demonstrates regional wall motion abnormalities (see scoring  diagram/findings for description). There is mild left  ventricular hypertrophy of the basal-septal segment. Left ventricular  diastolic parameters are consistent with Grade I diastolic dysfunction  (impaired relaxation).   3. Right ventricular systolic function is normal. The right ventricular  size is normal.    4. The mitral valve is normal in structure. Trivial mitral valve  regurgitation. No evidence of mitral stenosis.   5. The aortic valve is tricuspid. Aortic valve regurgitation is not  visualized. No aortic stenosis is present.   FINDINGS   Left Ventricle: Left ventricular ejection fraction, by estimation, is 40  to 45%. The left ventricle has mildly decreased function.  The left  ventricle demonstrates regional wall motion abnormalities. The left  ventricular internal cavity size was normal  in size. There is mild left ventricular hypertrophy of the basal-septal  segment. Left ventricular diastolic parameters are consistent with Grade I  diastolic dysfunction (impaired relaxation).   Right Ventricle: The right ventricular size is normal. Right ventricular  systolic function is normal.   Left Atrium: Left atrial size was normal in size.   Right Atrium: Right atrial size was normal in size.   Pericardium: There is no evidence of pericardial effusion.   Mitral Valve: The mitral valve is normal in structure. Trivial mitral  valve regurgitation. No evidence of mitral valve stenosis.   Tricuspid Valve: The tricuspid valve is normal in structure. Tricuspid  valve regurgitation is trivial. No evidence of tricuspid stenosis.   Aortic Valve: The aortic valve is tricuspid. Aortic valve regurgitation is  not visualized. No aortic stenosis is present. Aortic valve mean gradient  measures 4.0 mmHg. Aortic valve peak gradient measures 4.8 mmHg. Aortic  valve area, by VTI measures 3.25  cm.   Pulmonic Valve: The pulmonic valve was not well visualized. Pulmonic valve  regurgitation is not visualized. No evidence of pulmonic stenosis.   Aorta: The aortic root is normal in size and structure.   Venous: The inferior vena cava was not well visualized.   IAS/Shunts: The interatrial septum was not well visualized.   Additional Comments: Akinesis of the distal septum, distal inferior wall   and apex with overall mild LV dysfunction; cannot R/O apical thrombus;  suggest cardiac MRI or repeat limited echo with contrast to further  assess.     I have independently reviewed the above radiologic studies and discussed with the patient   Diagnostic Dominance: Right   Recent Lab Findings: Lab Results  Component Value Date   WBC 9.2 08/21/2024   HGB 11.7 (L) 08/21/2024   HCT 34.0 (L) 08/21/2024   PLT 184 08/21/2024   GLUCOSE 158 (H) 08/21/2024   ALT 18 08/21/2024   AST 25 08/21/2024   NA 139 08/21/2024   K 4.1 08/21/2024   CL 106 08/21/2024   CREATININE 2.16 (H) 08/21/2024   BUN 34 (H) 08/21/2024   CO2 19 (L) 08/21/2024   INR 1.1 08/21/2024   HGBA1C 5.9 (H) 08/22/2024   Assessment / Plan:   S/p STEMI, coronary artery disease (LVEF by echocardiogram 40-45%)-He is on Nitroglycerin  and Heparin  drips. It appears he would benefit from coronary artery bypass grafting surgery. Timing in part to depend on creatinine (please see below). Surgeon to evaluate later today and provide recommendations. History of CKD (stage IIIb)-creatinine yesterday 2.16. Await today's result Chronic Kidney Disease   Stage I     GFR >90  Stage II    GFR 60-89  Stage IIIA GFR 45-59  Stage IIIB GFR 30-44  Stage IV   GFR 15-29  Stage V    GFR  <15  Lab Results  Component Value Date   CREATININE 2.16 (H) 08/21/2024  Estimated Creatinine Clearance: 32.4 mL/min (A) (by C-G formula based on SCr of 2.16 mg/dL (H).  3. History of hypertension-on Amlodipine  5 mg daily 4. History of diabetes mellitus-On Insulin  and Ozempic prior to admission. Pre op HGA1C 5.9 History of bladder cancer-s/p cystoprostatectomy with ileal conduit    I  spent 30 minutes counseling the patient face to face.   Donielle Zimmerman PA-C 08/22/2024 12:05 PM   Chart reviewed, patient examined, agree with above.  He has a  tight proximal to mid LAD/diagonal bifurcation stenosis and high grade disease in small distal LCX  presenting with first episode of chest pain and STEMI. LVEF 40-45% by echo. ECG changes resolved and he remains pain free. He has baseline stage 3b CKD with creat around 2. I agree that CABG to the LAD and diag is the best treatment for him. I discussed the operative procedure with the patient and daughter including alternatives, benefits and risks; including but not limited to bleeding, blood transfusion, infection, stroke, myocardial infarction, graft failure, heart block requiring a permanent pacemaker, organ dysfunction, and death.  Adam Chung understands and agrees to proceed.  We will schedule surgery for tomorrow am if creat does not rise significantly post cath. Will check creatinine early in the am.  Adam LOIS Fellers, MD.

## 2024-08-22 NOTE — Anesthesia Preprocedure Evaluation (Signed)
 Anesthesia Evaluation  Patient identified by MRN, date of birth, ID band Patient awake    Reviewed: Allergy & Precautions, NPO status , Patient's Chart, lab work & pertinent test results  History of Anesthesia Complications Negative for: history of anesthetic complications  Airway Mallampati: II  TM Distance: >3 FB Neck ROM: Full    Dental  (+) Dental Advisory Given   Pulmonary neg pulmonary ROS, former smoker   breath sounds clear to auscultation       Cardiovascular hypertension, Pt. on medications (-) angina + CAD (severe LAD, Cx disease) and + Past MI   Rhythm:Regular Rate:Normal  08/22/2024 ECHO: EF 40-45%, akinesis of septum, distal inferior wall, apex, overall mild LV dysfunction, mild LVH, Grade 1 DD, normal RVF, no significant valvular abnormalities   Neuro/Psych negative neurological ROS     GI/Hepatic negative GI ROS, Neg liver ROS,,,  Endo/Other  diabetes (glu 82), Insulin  Dependent  semaglutide  Renal/GU Renal InsufficiencyRenal disease   H/o bladder cancer    Musculoskeletal   Abdominal   Peds  Hematology Hb 13.2, plt 201k   Anesthesia Other Findings   Reproductive/Obstetrics                              Anesthesia Physical Anesthesia Plan  ASA: 4  Anesthesia Plan: General   Post-op Pain Management:    Induction: Intravenous  PONV Risk Score and Plan: 2 and Treatment may vary due to age or medical condition  Airway Management Planned: Oral ETT  Additional Equipment: Arterial line, PA Cath, TEE and Ultrasound Guidance Line Placement  Intra-op Plan:   Post-operative Plan: Post-operative intubation/ventilation  Informed Consent: I have reviewed the patients History and Physical, chart, labs and discussed the procedure including the risks, benefits and alternatives for the proposed anesthesia with the patient or authorized representative who has indicated his/her  understanding and acceptance.     Dental advisory given  Plan Discussed with: CRNA and Surgeon  Anesthesia Plan Comments:          Anesthesia Quick Evaluation

## 2024-08-22 NOTE — Progress Notes (Signed)
PHARMACY - ANTICOAGULATION CONSULT NOTE  Pharmacy Consult for heparin  Indication: 2V CAD  Allergies  Allergen Reactions   Apple Juice Anaphylaxis   Benicar  [Olmesartan ] Other (See Comments)    Acute kidney injury   Penicillins Swelling    Angioedema     Patient Measurements: Height: 5' 11 (180.3 cm) Weight: 85.3 kg (188 lb 0.8 oz) IBW/kg (Calculated) : 75.3 HEPARIN  DW (KG): 84.8  Vital Signs: Temp: 98.3 F (36.8 C) (09/24 1136) Temp Source: Oral (09/24 1136) BP: 118/69 (09/24 1430) Pulse Rate: 78 (09/24 1430)  Labs: Recent Labs    08/21/24 2338 08/21/24 2339 08/21/24 2341 08/22/24 0241 08/22/24 0854 08/22/24 1427  HGB 11.9* 12.2* 11.7*  --   --  13.2  HCT 35.0* 36.0* 34.0*  --   --  39.6  PLT  --   --  184  --   --  201  LABPROT  --   --  14.9  --   --   --   INR  --   --  1.1  --   --   --   HEPARINUNFRC  --   --   --   --   --  0.28*  CREATININE 2.20*  --  2.16*  --   --  2.34*  TROPONINIHS  --   --  166* 3,315* 3,912*  --     Estimated Creatinine Clearance: 29.9 mL/min (A) (by C-G formula based on SCr of 2.34 mg/dL (H)).   Medical History: Past Medical History:  Diagnosis Date   Cancer (HCC)    bladder   Diabetes mellitus without complication (HCC)     Medications:  Medications Prior to Admission  Medication Sig Dispense Refill Last Dose/Taking   amLODipine  (NORVASC ) 5 MG tablet Take 1 tablet (5 mg total) by mouth daily for 7 days. 7 tablet 0 08/21/2024 Morning   Cholecalciferol (VITAMIN D3) 50 MCG (2000 UT) capsule Take 8,000 Units by mouth daily.   08/21/2024 Morning   ibuprofen (ADVIL) 200 MG tablet Take 400 mg by mouth 2 (two) times daily as needed for headache (pain).   08/21/2024 Evening   insulin  detemir (LEVEMIR  FLEXTOUCH) 100 UNIT/ML FlexPen Inject 32 Units into the skin daily.   08/21/2024 Morning   OVER THE COUNTER MEDICATION Take 1 tablet by mouth daily as needed (allergies, eye irritation). OTC unknown non-drowsy allergy relief, blue  tablet   Past Week   Semaglutide,0.25 or 0.5MG /DOS, (OZEMPIC, 0.25 OR 0.5 MG/DOSE,) 2 MG/3ML SOPN Inject 0.5 mg into the skin every Monday.   08/20/2024    Assessment: 68 yom who presents to the ED with CP in setting of STEMI. Patient was taken to cath lab and found to have 2V CAD. Plan is for complex PCI vs CABG. Pharmacy consulted to begin IV heparin  2h after TR band removed. TR band removed ~04:50. No bleeding noted, Hgb low, platelet are normal. No AC noted PTA.  9/24 PM: heparin  level 0.28, slightly subtherapeutic on 1200 units/hr. No issues with the infusion or bleeding reported per RN.  Goal of Therapy:  Heparin  level 0.3-0.7 units/ml Monitor platelets by anticoagulation protocol: Yes   Plan:  Increase IV heparin  to 1300 units/hr  8h heparin  level Daily heparin  level, CBC Monitor for s/sx of bleeding F/u TCTS work up  Thank you for involving pharmacy in this patient's care.  Rocky Slade, PharmD, BCPS Clinical Pharmacist 08/22/2024 3:42 PM  Please check AMION for all [facility] Pharmacy phone numbers After 10:00 PM, call Main Pharmacy  [phone]      

## 2024-08-22 NOTE — Progress Notes (Addendum)
 Patient Name: Adam Chung Date of Encounter: 08/22/2024 [facility] Cardiologist: None   Interval Summary  .    No chest pain  Vital Signs .    Vitals:   08/22/24 0600 08/22/24 0620 08/22/24 0630 08/22/24 0700  BP:   (!) 132/93 (!) 140/83  Pulse: 90 91 82 87  Resp: 18 15 13 14   Temp:      TempSrc:      SpO2: 95% 97% 96% 97%  Weight:      Height:        Intake/Output Summary (Last 24 hours) at 08/22/2024 0804 Last data filed at 08/22/2024 0726 Gross per 24 hour  Intake 362.2 ml  Output 1575 ml  Net -1212.8 ml      08/22/2024   12:15 AM 08/21/2024   11:00 PM 09/07/2022    4:37 AM  Last 3 Weights  Weight (lbs) 188 lb 0.8 oz 187 lb 173 lb 4.5 oz  Weight (kg) 85.3 kg 84.823 kg 78.6 kg      Telemetry/ECG    08/22/2024 - Personally Reviewed Occasional PVCs  EKG 08/22/2024: Normal sinus rhythm Left anterior fascicular block Septal infarct , age undetermined Abnormal ECG  Coronary angiogram 08/21/2024: 2 vessel obstructive CAD. There is a complex bifurcation stenosis involving the LAD and third diagonal branch which is fairly large. Medina class 1,1,0.  There is severe disease in the distal LCx  Moderately elevated LVEDP 28 mm Hg   Plan: Currently the patient is pain free and hemodynamically stable. Ecg changes have resolved. He has CKD with baseline creatinine 2.0. I felt it was best at this point to stabilize the patient medically and consider options for revascularization. Will resume IV heparin  and start IV Ntg. Needs some diuresis. Optimize GDMT. Monitor renal function closely and assess LV function with Echo. Options include complex LAD/diagonal bifurcation stenting vs CABG. Will discuss with heart team in am.    Echocardiogram 08/2021:  1. Technically difficult echo with poor image quality.   2. Left ventricular ejection fraction, by estimation, is 60 to 65%. The  left ventricle has normal function. The left ventricle has no regional  wall motion  abnormalities. Left ventricular diastolic parameters were  normal.   3. Right ventricular systolic function is mildly reduced. The right  ventricular size is normal.   4. The mitral valve is grossly normal. No evidence of mitral valve  regurgitation.   5. The aortic valve is normal in structure. Aortic valve regurgitation is  not visualized. No aortic stenosis is present.    Physical Exam .   Physical Exam Vitals and nursing note reviewed.  Constitutional:      General: He is not in acute distress. Neck:     Vascular: No JVD.  Cardiovascular:     Rate and Rhythm: Normal rate and regular rhythm.     Heart sounds: Normal heart sounds. No murmur heard. Pulmonary:     Effort: Pulmonary effort is normal.     Breath sounds: Normal breath sounds. No wheezing or rales.  Genitourinary:    Comments: Urostomy bag Musculoskeletal:     Right lower leg: No edema.     Left lower leg: No edema.      Assessment & Plan .     73 y/o male w/hypertension, IDDM, CKD stage 3b, bladder cancer and subsequent infections requiring cystoprostatectomy w/ileal conduit and urostomy bag (2023) with no recurrent infections since then, admitted with chest pain, STEMI  STEMI: Aborted STEMI. ST  elevation on presentation, subsequently resolved and chest pain free. Coronary angiogram showed patent but complex LAD/diag bifurcation disease and severe disease in small caliber Lcx. Trop HS 166-->3,315 Awaiting echocardiogram and renal function recovery (if it happens) to discuss revascularization options. Cr may be at his baseline of around 2.2 (eGFR 32) Will consult CVTS. If not surgical candidate, may need bifurcation PCI to LAD/diag. Continue Aspirin , heparin , nitroglycerin  drip, currently at 10 mics. Added Crestor  20 mg daily. Continue amlodipine  5 mg daily.  He was on Olmesartan  in the past, but has been off it in the recent past. Hold for now.   CKD stage 3b: Cr may be at baseline around 2.2. eGFR  32  IDDM: Continue insulin  coverage.  Diabetes control has improved in last 2 years. A1C 10.3 (08/2022) to 5.9 now.  For questions or updates, please contact [facility] Please consult [email or web address] for contact info under        Signed, Newman JINNY Lawrence, MD

## 2024-08-22 NOTE — Progress Notes (Addendum)
   08/21/24 2316  Spiritual Encounters  Type of Visit Initial  Care provided to: Pt and family  Reason for visit Code  OnCall Visit Yes   Responded to code STEMI, patient taken to cath lab. F/U patient admitted RM 2H24 family at bedside. Offered spiritual care.

## 2024-08-22 NOTE — Consult Note (Addendum)
 [address]       [place] [address]             [phone]        Adam Chung Adam Chung -[facility] Medical Record #[id number] Date of Birth: [birth year]  Referring: Dr. Elmira, MD Primary Care: Adam Chung FALCON, MD Primary Cardiologist:None  Chief Complaint:   Left arm and chest pain Reason for consultation: Coronary artery disease   History of Present Illness:     This is a 73 year old male with a past medical history of hypertension, diabetes mellitus, CKD (stage IIIb), very remote tobacco use (quit over 35 years ago), bladder cancer (s/p cystoprostatectomy with ileal conduit) who presented to [facility] ED via EMS with complaints of chest pain on 08/21/2024. 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   EKG showed ST elevation in leads V1-3 with reciprocal ST depression. Initial Troponin I (high sensitivity) was 166 and max thus far is 3,912. He ruled in for a STEMI.  Cardiac catheterization done 08/21/2024 showed mid LAD with an 85% stenosis, 3rd Diagonal with an 80% stenosis, mid to distal Circumflex with a 90%stenosis, and OM3 with a 100% stenosis. Echocardiogram has done earlier today showed LVEF 40-45%, regional wall motion abnormalities (akinesis of the distal septum, distal inferior wall and apex), trivial MR and TR, and cannot rule out apical thrombus.Cardiothoracic surgery has been consulted for consideration of coronary artery bypass grafting surgery. At the time of my exam, patient denied arm pain, chest pain or shortness of breath.   Patient lives alone. 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 He has a supportive daughter.  His daughter and son in law at bedside.  Current Activity/ Functional Status: Patient is independent with  mobility/ambulation, transfers, ADL's, IADL's.   Zubrod Score: At the time of surgery this patient's most appropriate activity status/level should be described as: []     0    Normal activity, no symptoms [x]     1    Restricted in physical strenuous activity but ambulatory, able to do out light work []     2    Ambulatory and capable of self care, unable to do work activities, up and about more than 50%  Of the time                            []     3    Only limited self care, in bed greater than 50% of waking hours []     4    Completely disabled, no self care, confined to bed or chair []     5    Moribund  Past Medical History:  Diagnosis Date   Cancer (HCC)    bladder   Diabetes mellitus without complication (HCC)   Hypertension  Past Surgical History:  Procedure Laterality Date   BIOPSY  09/24/2021   Procedure: BIOPSY;  Surgeon: Dianna Specking, MD;  Location: [facility];  Service: Endoscopy;;   BLADDER SURGERY     CYSTOSCOPY N/A 08/09/2021   Procedure: CYSTOSCOPY WITH INSERTION OF FOLEY CATHETER;  Surgeon: Selma Donnice SAUNDERS, MD;  Location: [facility];  Service: Urology;  Laterality: N/A;   ESOPHAGOGASTRODUODENOSCOPY (  EGD) WITH PROPOFOL  N/A 09/24/2021   Procedure: ESOPHAGOGASTRODUODENOSCOPY (EGD) WITH PROPOFOL ;  Surgeon: Dianna Specking, MD;  Location: [facility];  Service: Endoscopy;  Laterality: N/A;   fatty tumor excision     LEFT HEART CATH AND CORONARY ANGIOGRAPHY N/A 08/21/2024   Procedure: LEFT HEART CATH AND CORONARY ANGIOGRAPHY;  Surgeon: Swaziland, Peter M, MD;  Location: [facility] INVASIVE CV LAB;  Service: Cardiovascular;  Laterality: N/A;   LYMPH NODE DISSECTION Bilateral 11/27/2021   Procedure: PELVIC LYMPH NODE DISSECTION;  Surgeon: Alvaro Hummer, MD;  Location: [facility];  Service: Urology;  Laterality: Bilateral;   ROBOT ASSISTED LAPAROSCOPIC COMPLETE CYSTECT ILEAL CONDUIT N/A 11/27/2021   Procedure: XI ROBOTIC ASSISTED LAPAROSCOPIC COMPLETE CYSTECT ILEAL CONDUIT AND  INDOCYANINE GREEN  DYE INJECTION;  Surgeon: Alvaro Hummer, MD;  Location: [facility];  Service: Urology;  Laterality: N/A;   ROBOT ASSISTED LAPAROSCOPIC RADICAL PROSTATECTOMY N/A 11/27/2021   Procedure: XI ROBOTIC ASSISTED LAPAROSCOPIC RADICAL PROSTATECTOMY;  Surgeon: Alvaro Hummer, MD;  Location: [facility];  Service: Urology;  Laterality: N/A;   TRANSURETHRAL RESECTION OF PROSTATE N/A 08/09/2021   Procedure: TRANSURETHRAL RESECTION OF THE PROSTATE (TURP);  Surgeon: Selma Donnice SAUNDERS, MD;  Location: [facility];  Service: Urology;  Laterality: N/A;    Social History   Tobacco Use  Smoking Status Former. He quit 35 years ago  Smokeless Tobacco Never    Social History   Substance and Sexual Activity  Alcohol Use Not Currently     Allergies  Allergen Reactions   Apple Juice Anaphylaxis   Benicar  [Olmesartan ] Other (See Comments)    Acute kidney injury   Penicillins Swelling    Angioedema     Current Facility-Administered Medications  Medication Dose Route Frequency Provider Last Rate Last Admin   0.9 %  sodium chloride  infusion  250 mL Intravenous PRN Swaziland, Peter M, MD 10 mL/hr at 08/22/24 1026 Infusion Verify at 08/22/24 1026   acetaminophen  (TYLENOL ) tablet 650 mg  650 mg Oral Q4H PRN [place]az[place], Peter M, MD       amLODipine  (NORVASC ) tablet 5 mg  5 mg Oral Daily Salah, Husam M, MD   5 mg at 08/22/24 9056   aspirin  chewable tablet 81 mg  81 mg Oral Daily Swaziland, Peter M, MD   81 mg at 08/22/24 9056   Chlorhexidine  Gluconate Cloth 2 % PADS 6 each  6 each Topical Daily Swazil[place], Peter M, MD   6 each at 08/22/24 0947   furosemide  (LASIX ) injection 40 mg  40 mg Intravenous Once Jordan, Peter M, MD       heparin  ADULT infusion 100 units/mL (25000 units/250mL)  1,200 Units/hr Intravenous Continuous Bari Delon BIRCH, [place] 12 mL/hr at 08/22/24 1026 1,200 Units/hr at 08/22/24 1026   insulin  aspart (novoLOG ) injection 0-15 Units  0-15 Units Subcutaneous TID WC Salah, Husam M, MD       insulin  glargine  (LANTUS ) injection 15 Units  15 Units Subcutaneous Daily Patwardhan, Newman PARAS, MD   15 Units at 08/22/24 0943   magnesium  sulfate IVPB 4 g 100 mL  4 g Intravenous Once Patwardhan, Manish J, MD 50 mL/hr at 08/22/24 1153 4 g at 08/22/24 1153   nitroGLYCERIN  (NITROSTAT ) SL tablet 0.4 mg  0.4 mg Sublingual Q5 Min x 3 PRN Salah, Husam M, MD       nitroGLYCERIN  50 mg in dextrose  5 % 250 mL (0.2 mg/mL) infusion  0-200 mcg/min Intravenous Titrated [place]azil[place], Peter M, MD 3 mL/hr at 08/22/24 1026 10 mcg/min at 08/22/24 1026  ondansetron  (ZOFRAN ) injection 4 mg  4 mg Intravenous Q6H PRN [place], Peter M, MD       rosuvastatin  (CRESTOR ) tablet 20 mg  20 mg Oral Daily Patwardhan, Manish J, MD   20 mg at 08/22/24 0943   sodium chloride  flush (NS) 0.9 % injection 3 mL  3 mL Intravenous Q12H [place], Peter M, MD   3 mL at 08/22/24 9052   sodium chloride  flush (NS) 0.9 % injection 3 mL  3 mL Intravenous PRN [place], Peter M, MD        Medications Prior to Admission  Medication Sig Dispense Refill Last Dose/Taking   amLODipine  (NORVASC ) 5 MG tablet Take 1 tablet (5 mg total) by mouth daily for 7 days. 7 tablet 0 08/21/2024 Morning   Cholecalciferol (VITAMIN D3) 50 MCG (2000 UT) capsule Take 8,000 Units by mouth daily.   08/21/2024 Morning   ibuprofen (ADVIL) 200 MG tablet Take 400 mg by mouth 2 (two) times daily as needed for headache (pain).   08/21/2024 Evening   insulin  detemir (LEVEMIR  FLEXTOUCH) 100 UNIT/ML FlexPen Inject 32 Units into the skin daily.   08/21/2024 Morning   OVER THE COUNTER MEDICATION Take 1 tablet by mouth daily as needed (allergies, eye irritation). OTC unknown non-drowsy allergy relief, blue tablet   Past Week   Semaglutide,0.25 or 0.5MG /DOS, (OZEMPIC, 0.25 OR 0.5 MG/DOSE,) 2 MG/3ML SOPN Inject 0.5 mg into the skin every Monday.   08/20/2024    Family History  Problem Relation Age of Onset   Heart disease Neg Hx     Review of Systems:    Cardiac Review of Systems: Y or  [ N   ]= no  Chest  Pain [ Y on admission   ]  Exertional SOB  [ N ]     Palpitations [  N] Syncope  [ N ]     General Review of Systems: [Y] = yes [ N ]=no Constitional: nausea [ N ];  fever [  N];  Dental: Last Dentist visit: Recently;had a crown  Eye : Amaurosis fugax[ N ]; Resp: cough Discordia.Diesel  ];  wheezing[N  ];  hemoptysis[ N ]; s GI:  vomiting[ N ];   melena[N  ];  hematochezia [  N]; GU: urostomy [ Y ];             Skin: rash, swelling[N  ];,  peripheral edema[N  ];   Musculosketetal:joint pain[ Y-DJD neck into left arm ];    Heme/Lymph:  anemia[Y  ];  Neuro: TIA[ N ];  h  stroke[ N ];  difficulty walking[ N ];  Endocrine: diabetes[ Y ]               Physical Exam: BP 132/75   Pulse 78   Temp 98.3 F (36.8 C) (Oral)   Resp 13   Ht 5' 11 (1.803 m)   Wt 85.3 kg   SpO2 96%   BMI 26.23 kg/m    General appearance: alert, cooperative, and no distress Head: Normocephalic, without obvious abnormality, atraumatic Neck: no carotid bruit, no JVD, and supple, symmetrical, trachea midline Resp: clear to auscultation bilaterally Cardio: RRR, no murmur GI: Soft, non tender, bowel sounds present. Urostomy on right side of abdomen Extremities: No LE edema. Palpable DP/PT bilaterally Neurologic: Grossly normal  Diagnostic Studies & Laboratory data:     Recent Radiology Findings:   CARDIAC CATHETERIZATION Result Date: 08/22/2024   Prox RCA-1 lesion is 45% stenosed.   Prox RCA-2 lesion  is 20% stenosed.   Mid Cx to Dist Cx lesion is 90% stenosed.   3rd Mrg lesion is 100% stenosed.   Mid LAD lesion is 85% stenosed.   3rd Diag lesion is 80% stenosed.   LV end diastolic pressure is moderately elevated. 2 vessel obstructive CAD. There is a complex bifurcation stenosis involving the LAD and third diagonal branch which is fairly large. Medina class 1,1,0.  There is severe disease in the distal LCx Moderately elevated LVEDP 28 mm Hg Plan: Currently the patient is pain free and hemodynamically stable. Ecg changes have  resolved. He has CKD with baseline creatinine 2.0. I felt it was best at this point to stabilize the patient medically and consider options for revascularization. Will resume IV heparin  and start IV Ntg. Needs some diuresis. Optimize GDMT. Monitor renal function closely and assess LV function with Echo. Options include complex LAD/diagonal bifurcation stenting vs CABG. Will discuss with heart team in am.   Patient Name:   Adam Chung Date of Exam: 08/22/2024  Medical Rec #:  [id number]         Height:       71.0 in  Accession #:    [id number]        Weight:       188.1 lb  Date of Birth:  01/01/1951         BSA:          2.054 m  Patient Age:    73 years          BP:           134/69 mmHg  Patient Gender: M                 HR:           82 bpm.  Exam Location:  Inpatient   Procedure: 2D Echo, Cardiac Doppler and Color Doppler (Both Spectral and  Color            Flow Doppler were utilized during procedure).   Indications:    Chest Pain R07.9    History:        Patient has prior history of Echocardiogram examinations,  most                 recent 09/21/2021. CAD; Risk Factors:Diabetes.    Sonographer:    Jayson Gaskins  Referring Phys: [phone] PETER M SWAZILAND   IMPRESSIONS     1. Akinesis of the distal septum, distal inferior wall and apex with  overall mild LV dysfunction; cannot R/O apical thrombus; suggest cardiac  MRI or repeat limited echo with contrast to further assess.   2. Left ventricular ejection fraction, by estimation, is 40 to 45%. The  left ventricle has mildly decreased function. The left ventricle  demonstrates regional wall motion abnormalities (see scoring  diagram/findings for description). There is mild left  ventricular hypertrophy of the basal-septal segment. Left ventricular  diastolic parameters are consistent with Grade I diastolic dysfunction  (impaired relaxation).   3. Right ventricular systolic function is normal. The right ventricular  size is normal.    4. The mitral valve is normal in structure. Trivial mitral valve  regurgitation. No evidence of mitral stenosis.   5. The aortic valve is tricuspid. Aortic valve regurgitation is not  visualized. No aortic stenosis is present.   FINDINGS   Left Ventricle: Left ventricular ejection fraction, by estimation, is 40  to 45%. The left ventricle has mildly decreased function.  The left  ventricle demonstrates regional wall motion abnormalities. The left  ventricular internal cavity size was normal  in size. There is mild left ventricular hypertrophy of the basal-septal  segment. Left ventricular diastolic parameters are consistent with Grade I  diastolic dysfunction (impaired relaxation).   Right Ventricle: The right ventricular size is normal. Right ventricular  systolic function is normal.   Left Atrium: Left atrial size was normal in size.   Right Atrium: Right atrial size was normal in size.   Pericardium: There is no evidence of pericardial effusion.   Mitral Valve: The mitral valve is normal in structure. Trivial mitral  valve regurgitation. No evidence of mitral valve stenosis.   Tricuspid Valve: The tricuspid valve is normal in structure. Tricuspid  valve regurgitation is trivial. No evidence of tricuspid stenosis.   Aortic Valve: The aortic valve is tricuspid. Aortic valve regurgitation is  not visualized. No aortic stenosis is present. Aortic valve mean gradient  measures 4.0 mmHg. Aortic valve peak gradient measures 4.8 mmHg. Aortic  valve area, by VTI measures 3.25  cm.   Pulmonic Valve: The pulmonic valve was not well visualized. Pulmonic valve  regurgitation is not visualized. No evidence of pulmonic stenosis.   Aorta: The aortic root is normal in size and structure.   Venous: The inferior vena cava was not well visualized.   IAS/Shunts: The interatrial septum was not well visualized.   Additional Comments: Akinesis of the distal septum, distal inferior wall   and apex with overall mild LV dysfunction; cannot R/O apical thrombus;  suggest cardiac MRI or repeat limited echo with contrast to further  assess.     I have independently reviewed the above radiologic studies and discussed with the patient   Diagnostic Dominance: Right   Recent Lab Findings: Lab Results  Component Value Date   WBC 9.2 08/21/2024   HGB 11.7 (L) 08/21/2024   HCT 34.0 (L) 08/21/2024   PLT 184 08/21/2024   GLUCOSE 158 (H) 08/21/2024   ALT 18 08/21/2024   AST 25 08/21/2024   NA 139 08/21/2024   K 4.1 08/21/2024   CL 106 08/21/2024   CREATININE 2.16 (H) 08/21/2024   BUN 34 (H) 08/21/2024   CO2 19 (L) 08/21/2024   INR 1.1 08/21/2024   HGBA1C 5.9 (H) 08/22/2024   Assessment / Plan:   S/p STEMI, coronary artery disease (LVEF by echocardiogram 40-45%)-He is on Nitroglycerin  and Heparin  drips. It appears he would benefit from coronary artery bypass grafting surgery. Timing in part to depend on creatinine (please see below). Surgeon to evaluate later today and provide recommendations. History of CKD (stage IIIb)-creatinine yesterday 2.16. Await today's result Chronic Kidney Disease   Stage I     GFR >90  Stage II    GFR 60-89  Stage IIIA GFR 45-59  Stage IIIB GFR 30-44  Stage IV   GFR 15-29  Stage V    GFR  <15  Lab Results  Component Value Date   CREATININE 2.16 (H) 08/21/2024  Estimated Creatinine Clearance: 32.4 mL/min (A) (by C-G formula based on SCr of 2.16 mg/dL (H).  3. History of hypertension-on Amlodipine  5 mg daily 4. History of diabetes mellitus-On Insulin  and Ozempic prior to admission. Pre op HGA1C 5.9 History of bladder cancer-s/p cystoprostatectomy with ileal conduit    I  spent 30 minutes counseling the patient face to face.   Donielle Zimmerman PA-C 08/22/2024 12:05 PM   Chart reviewed, patient examined, agree with above.  He has a  tight proximal to mid LAD/diagonal bifurcation stenosis and high grade disease in small distal LCX  presenting with first episode of chest pain and STEMI. LVEF 40-45% by echo. ECG changes resolved and he remains pain free. He has baseline stage 3b CKD with creat around 2. I agree that CABG to the LAD and diag is the best treatment for him. I discussed the operative procedure with the patient and daughter including alternatives, benefits and risks; including but not limited to bleeding, blood transfusion, infection, stroke, myocardial infarction, graft failure, heart block requiring a permanent pacemaker, organ dysfunction, and death.  Ozell LELON Brandy understands and agrees to proceed.  We will schedule surgery for tomorrow am if creat does not rise significantly post cath. Will check creatinine early in the am.  Dorise LOIS Fellers, MD.

## 2024-08-22 NOTE — Plan of Care (Signed)
  Problem: Education: Goal: Knowledge of General Education information will improve Description: Including pain rating scale, medication(s)/side effects and non-pharmacologic comfort measures Outcome: Progressing   Problem: Health Behavior/Discharge Planning: Goal: Ability to manage health-related needs will improve Outcome: Progressing   Problem: Clinical Measurements: Goal: Ability to maintain clinical measurements within normal limits will improve Outcome: Progressing Goal: Will remain free from infection Outcome: Progressing Goal: Diagnostic test results will improve Outcome: Progressing Goal: Respiratory complications will improve Outcome: Progressing Goal: Cardiovascular complication will be avoided Outcome: Progressing   Problem: Activity: Goal: Risk for activity intolerance will decrease Outcome: Progressing   Problem: Nutrition: Goal: Adequate nutrition will be maintained Outcome: Progressing   Problem: Coping: Goal: Level of anxiety will decrease Outcome: Progressing   Problem: Elimination: Goal: Will not experience complications related to bowel motility Outcome: Progressing Goal: Will not experience complications related to urinary retention Outcome: Progressing   Problem: Pain Managment: Goal: General experience of comfort will improve and/or be controlled Outcome: Progressing   Problem: Safety: Goal: Ability to remain free from injury will improve Outcome: Progressing   Problem: Skin Integrity: Goal: Risk for impaired skin integrity will decrease Outcome: Progressing   Problem: Education: Goal: Understanding of CV disease, CV risk reduction, and recovery process will improve Outcome: Progressing Goal: Individualized Educational Video(s) Outcome: Progressing   Problem: Activity: Goal: Ability to return to baseline activity level will improve Outcome: Progressing   Problem: Cardiovascular: Goal: Ability to achieve and maintain adequate  cardiovascular perfusion will improve Outcome: Not Progressing Goal: Vascular access site(s) Level 0-1 will be maintained Outcome: Progressing   Problem: Health Behavior/Discharge Planning: Goal: Ability to safely manage health-related needs after discharge will improve Outcome: Progressing   Problem: Education: Goal: Ability to describe self-care measures that may prevent or decrease complications (Diabetes Survival Skills Education) will improve Outcome: Progressing Goal: Individualized Educational Video(s) Outcome: Progressing   Problem: Coping: Goal: Ability to adjust to condition or change in health will improve Outcome: Progressing   Problem: Fluid Volume: Goal: Ability to maintain a balanced intake and output will improve Outcome: Progressing   Problem: Health Behavior/Discharge Planning: Goal: Ability to identify and utilize available resources and services will improve Outcome: Progressing Goal: Ability to manage health-related needs will improve Outcome: Progressing   Problem: Metabolic: Goal: Ability to maintain appropriate glucose levels will improve Outcome: Progressing   Problem: Nutritional: Goal: Maintenance of adequate nutrition will improve Outcome: Progressing Goal: Progress toward achieving an optimal weight will improve Outcome: Progressing   Problem: Skin Integrity: Goal: Risk for impaired skin integrity will decrease Outcome: Progressing   Problem: Tissue Perfusion: Goal: Adequacy of tissue perfusion will improve Outcome: Progressing   Problem: Education: Goal: Understanding of cardiac disease, CV risk reduction, and recovery process will improve Outcome: Progressing Goal: Individualized Educational Video(s) Outcome: Progressing   Problem: Activity: Goal: Ability to tolerate increased activity will improve Outcome: Progressing   Problem: Cardiac: Goal: Ability to achieve and maintain adequate cardiovascular perfusion will  improve Outcome: Progressing   Problem: Health Behavior/Discharge Planning: Goal: Ability to safely manage health-related needs after discharge will improve Outcome: Progressing

## 2024-08-22 NOTE — Telephone Encounter (Addendum)
 Patient Product/process development scientist completed.    The patient is insured through [facility]. Patient has Medicare and is not eligible for a copay card, but may be able to apply for patient assistance or Medicare RX Payment Plan (Patient Must reach out to their plan, if eligible for payment plan), if available.    Ran test claim for Repatha 140 mg/ml and Requires Prior Authorization  Ran test claim for Jardiance 10 mg and 30 day copay is $302.00 due to a $255.00 deductible.  Will be $47.00 once deductible is met.  This test claim was processed through [facility]- copay amounts may vary at other pharmacies due to pharmacy/plan contracts, or as the patient moves through the different stages of their insurance plan.     Reyes Sharps, CPHT Pharmacy Technician III Certified Patient Advocate [facility] Patient Advocate Team Direct Number: [phone]  Fax: [phone]

## 2024-08-23 ENCOUNTER — Encounter (HOSPITAL_COMMUNITY): Payer: Self-pay | Admitting: Cardiology

## 2024-08-23 ENCOUNTER — Telehealth (HOSPITAL_COMMUNITY): Payer: Self-pay | Admitting: Pharmacy Technician

## 2024-08-23 ENCOUNTER — Inpatient Hospital Stay (HOSPITAL_COMMUNITY)

## 2024-08-23 ENCOUNTER — Other Ambulatory Visit (HOSPITAL_COMMUNITY): Payer: Self-pay

## 2024-08-23 ENCOUNTER — Other Ambulatory Visit: Payer: Self-pay

## 2024-08-23 ENCOUNTER — Inpatient Hospital Stay (HOSPITAL_COMMUNITY): Admitting: Anesthesiology

## 2024-08-23 ENCOUNTER — Inpatient Hospital Stay (HOSPITAL_COMMUNITY)
Admission: EM | Disposition: A | Discharge status: Home Health | Payer: Self-pay | Source: Home / Self Care | Attending: Surgery

## 2024-08-23 DIAGNOSIS — Z87891 Personal history of nicotine dependence: Secondary | ICD-10-CM | POA: Diagnosis not present

## 2024-08-23 DIAGNOSIS — I2510 Atherosclerotic heart disease of native coronary artery without angina pectoris: Secondary | ICD-10-CM

## 2024-08-23 DIAGNOSIS — E119 Type 2 diabetes mellitus without complications: Secondary | ICD-10-CM | POA: Diagnosis not present

## 2024-08-23 DIAGNOSIS — I10 Essential (primary) hypertension: Secondary | ICD-10-CM

## 2024-08-23 DIAGNOSIS — Z951 Presence of aortocoronary bypass graft: Secondary | ICD-10-CM

## 2024-08-23 LAB — POCT I-STAT 7, (LYTES, BLD GAS, ICA,H+H)
Acid-base deficit: 3 mmol/L — ABNORMAL HIGH (ref 0.0–2.0)
Acid-base deficit: 2 mmol/L (ref 0.0–2.0)
Acid-base deficit: 6 mmol/L — ABNORMAL HIGH (ref 0.0–2.0)
Acid-base deficit: 3 mmol/L — ABNORMAL HIGH (ref 0.0–2.0)
Acid-base deficit: 4 mmol/L — ABNORMAL HIGH (ref 0.0–2.0)
Acid-base deficit: 4 mmol/L — ABNORMAL HIGH (ref 0.0–2.0)
Acid-base deficit: 3 mmol/L — ABNORMAL HIGH (ref 0.0–2.0)
Bicarbonate: 22.6 mmol/L (ref 20.0–28.0)
Bicarbonate: 19 mmol/L — ABNORMAL LOW (ref 20.0–28.0)
Bicarbonate: 23 mmol/L (ref 20.0–28.0)
Bicarbonate: 21.2 mmol/L (ref 20.0–28.0)
Bicarbonate: 20.5 mmol/L (ref 20.0–28.0)
Bicarbonate: 21.7 mmol/L (ref 20.0–28.0)
Bicarbonate: 22.9 mmol/L (ref 20.0–28.0)
Calcium, Ion: 1.18 mmol/L (ref 1.15–1.40)
Calcium, Ion: 0.95 mmol/L — ABNORMAL LOW (ref 1.15–1.40)
Calcium, Ion: 1 mmol/L — ABNORMAL LOW (ref 1.15–1.40)
Calcium, Ion: 0.98 mmol/L — ABNORMAL LOW (ref 1.15–1.40)
Calcium, Ion: 1.06 mmol/L — ABNORMAL LOW (ref 1.15–1.40)
Calcium, Ion: 1.1 mmol/L — ABNORMAL LOW (ref 1.15–1.40)
Calcium, Ion: 1.07 mmol/L — ABNORMAL LOW (ref 1.15–1.40)
HCT: 34 % — ABNORMAL LOW (ref 39.0–52.0)
HCT: 26 % — ABNORMAL LOW (ref 39.0–52.0)
HCT: 26 % — ABNORMAL LOW (ref 39.0–52.0)
HCT: 26 % — ABNORMAL LOW (ref 39.0–52.0)
HCT: 27 % — ABNORMAL LOW (ref 39.0–52.0)
HCT: 28 % — ABNORMAL LOW (ref 39.0–52.0)
HCT: 26 % — ABNORMAL LOW (ref 39.0–52.0)
Hemoglobin: 11.6 g/dL — ABNORMAL LOW (ref 13.0–17.0)
Hemoglobin: 8.8 g/dL — ABNORMAL LOW (ref 13.0–17.0)
Hemoglobin: 8.8 g/dL — ABNORMAL LOW (ref 13.0–17.0)
Hemoglobin: 8.8 g/dL — ABNORMAL LOW (ref 13.0–17.0)
Hemoglobin: 9.2 g/dL — ABNORMAL LOW (ref 13.0–17.0)
Hemoglobin: 9.5 g/dL — ABNORMAL LOW (ref 13.0–17.0)
Hemoglobin: 8.8 g/dL — ABNORMAL LOW (ref 13.0–17.0)
O2 Saturation: 100 %
O2 Saturation: 100 %
O2 Saturation: 100 %
O2 Saturation: 100 %
O2 Saturation: 98 %
O2 Saturation: 94 %
O2 Saturation: 94 %
Patient temperature: 36.1
Patient temperature: 37.5
Patient temperature: 37.7
Potassium: 4.1 mmol/L (ref 3.5–5.1)
Potassium: 4.6 mmol/L (ref 3.5–5.1)
Potassium: 4.7 mmol/L (ref 3.5–5.1)
Potassium: 4.7 mmol/L (ref 3.5–5.1)
Potassium: 4.4 mmol/L (ref 3.5–5.1)
Potassium: 4.7 mmol/L (ref 3.5–5.1)
Potassium: 4.6 mmol/L (ref 3.5–5.1)
Sodium: 138 mmol/L (ref 135–145)
Sodium: 138 mmol/L (ref 135–145)
Sodium: 139 mmol/L (ref 135–145)
Sodium: 139 mmol/L (ref 135–145)
Sodium: 140 mmol/L (ref 135–145)
Sodium: 141 mmol/L (ref 135–145)
Sodium: 142 mmol/L (ref 135–145)
TCO2: 24 mmol/L (ref 22–32)
TCO2: 20 mmol/L — ABNORMAL LOW (ref 22–32)
TCO2: 24 mmol/L (ref 22–32)
TCO2: 22 mmol/L (ref 22–32)
TCO2: 22 mmol/L (ref 22–32)
TCO2: 23 mmol/L (ref 22–32)
TCO2: 24 mmol/L (ref 22–32)
pCO2 arterial: 40.1 mmHg (ref 32–48)
pCO2 arterial: 34.2 mmHg (ref 32–48)
pCO2 arterial: 37.4 mmHg (ref 32–48)
pCO2 arterial: 33.6 mmHg (ref 32–48)
pCO2 arterial: 33.7 mmHg (ref 32–48)
pCO2 arterial: 44.7 mmHg (ref 32–48)
pCO2 arterial: 42.8 mmHg (ref 32–48)
pH, Arterial: 7.358 (ref 7.35–7.45)
pH, Arterial: 7.352 (ref 7.35–7.45)
pH, Arterial: 7.397 (ref 7.35–7.45)
pH, Arterial: 7.407 (ref 7.35–7.45)
pH, Arterial: 7.389 (ref 7.35–7.45)
pH, Arterial: 7.296 — ABNORMAL LOW (ref 7.35–7.45)
pH, Arterial: 7.34 — ABNORMAL LOW (ref 7.35–7.45)
pO2, Arterial: 516 mmHg — ABNORMAL HIGH (ref 83–108)
pO2, Arterial: 299 mmHg — ABNORMAL HIGH (ref 83–108)
pO2, Arterial: 503 mmHg — ABNORMAL HIGH (ref 83–108)
pO2, Arterial: 456 mmHg — ABNORMAL HIGH (ref 83–108)
pO2, Arterial: 98 mmHg (ref 83–108)
pO2, Arterial: 80 mmHg — ABNORMAL LOW (ref 83–108)
pO2, Arterial: 78 mmHg — ABNORMAL LOW (ref 83–108)

## 2024-08-23 LAB — ECHO INTRAOPERATIVE TEE
AV Mean grad: 2 mmHg
AV Peak grad: 3.6 mmHg
Ao pk vel: 0.95 m/s
Height: 71 in
S' Lateral: 2.5 cm
Weight: 3008.84 [oz_av]

## 2024-08-23 LAB — BASIC METABOLIC PANEL WITH GFR
Anion gap: 9 (ref 5–15)
BUN: 31 mg/dL — ABNORMAL HIGH (ref 8–23)
CO2: 24 mmol/L (ref 22–32)
Calcium: 7.4 mg/dL — ABNORMAL LOW (ref 8.9–10.3)
Chloride: 106 mmol/L (ref 98–111)
Creatinine, Ser: 2.13 mg/dL — ABNORMAL HIGH (ref 0.61–1.24)
GFR, Estimated: 32 mL/min — ABNORMAL LOW (ref >60)
Glucose, Bld: 119 mg/dL — ABNORMAL HIGH (ref 70–99)
Potassium: 4.5 mmol/L (ref 3.5–5.1)
Sodium: 139 mmol/L (ref 135–145)

## 2024-08-23 LAB — POCT I-STAT, CHEM 8
BUN: 35 mg/dL — ABNORMAL HIGH (ref 8–23)
BUN: 34 mg/dL — ABNORMAL HIGH (ref 8–23)
BUN: 29 mg/dL — ABNORMAL HIGH (ref 8–23)
BUN: 31 mg/dL — ABNORMAL HIGH (ref 8–23)
Calcium, Ion: 1.21 mmol/L (ref 1.15–1.40)
Calcium, Ion: 1.14 mmol/L — ABNORMAL LOW (ref 1.15–1.40)
Calcium, Ion: 0.95 mmol/L — ABNORMAL LOW (ref 1.15–1.40)
Calcium, Ion: 1 mmol/L — ABNORMAL LOW (ref 1.15–1.40)
Chloride: 106 mmol/L (ref 98–111)
Chloride: 107 mmol/L (ref 98–111)
Chloride: 103 mmol/L (ref 98–111)
Chloride: 105 mmol/L (ref 98–111)
Creatinine, Ser: 2.3 mg/dL — ABNORMAL HIGH (ref 0.61–1.24)
Creatinine, Ser: 2.3 mg/dL — ABNORMAL HIGH (ref 0.61–1.24)
Creatinine, Ser: 1.9 mg/dL — ABNORMAL HIGH (ref 0.61–1.24)
Creatinine, Ser: 2 mg/dL — ABNORMAL HIGH (ref 0.61–1.24)
Glucose, Bld: 82 mg/dL (ref 70–99)
Glucose, Bld: 91 mg/dL (ref 70–99)
Glucose, Bld: 79 mg/dL (ref 70–99)
Glucose, Bld: 109 mg/dL — ABNORMAL HIGH (ref 70–99)
HCT: 35 % — ABNORMAL LOW (ref 39.0–52.0)
HCT: 33 % — ABNORMAL LOW (ref 39.0–52.0)
HCT: 26 % — ABNORMAL LOW (ref 39.0–52.0)
HCT: 25 % — ABNORMAL LOW (ref 39.0–52.0)
Hemoglobin: 11.9 g/dL — ABNORMAL LOW (ref 13.0–17.0)
Hemoglobin: 11.2 g/dL — ABNORMAL LOW (ref 13.0–17.0)
Hemoglobin: 8.8 g/dL — ABNORMAL LOW (ref 13.0–17.0)
Hemoglobin: 8.5 g/dL — ABNORMAL LOW (ref 13.0–17.0)
Potassium: 4.1 mmol/L (ref 3.5–5.1)
Potassium: 4.2 mmol/L (ref 3.5–5.1)
Potassium: 4.7 mmol/L (ref 3.5–5.1)
Potassium: 4.7 mmol/L (ref 3.5–5.1)
Sodium: 139 mmol/L (ref 135–145)
Sodium: 138 mmol/L (ref 135–145)
Sodium: 138 mmol/L (ref 135–145)
Sodium: 139 mmol/L (ref 135–145)
TCO2: 24 mmol/L (ref 22–32)
TCO2: 22 mmol/L (ref 22–32)
TCO2: 22 mmol/L (ref 22–32)
TCO2: 25 mmol/L (ref 22–32)

## 2024-08-23 LAB — CBC
HCT: 27.4 % — ABNORMAL LOW (ref 39.0–52.0)
HCT: 27.6 % — ABNORMAL LOW (ref 39.0–52.0)
HCT: 40.1 % (ref 39.0–52.0)
Hemoglobin: 13.6 g/dL (ref 13.0–17.0)
Hemoglobin: 9.3 g/dL — ABNORMAL LOW (ref 13.0–17.0)
Hemoglobin: 9.4 g/dL — ABNORMAL LOW (ref 13.0–17.0)
MCH: 30.4 pg (ref 26.0–34.0)
MCH: 30.4 pg (ref 26.0–34.0)
MCH: 30.4 pg (ref 26.0–34.0)
MCHC: 33.9 g/dL (ref 30.0–36.0)
MCHC: 33.9 g/dL (ref 30.0–36.0)
MCHC: 34.1 g/dL (ref 30.0–36.0)
MCV: 89.3 fL (ref 80.0–100.0)
MCV: 89.5 fL (ref 80.0–100.0)
MCV: 89.5 fL (ref 80.0–100.0)
Platelets: 144 K/uL — ABNORMAL LOW (ref 150–400)
Platelets: 152 K/uL (ref 150–400)
Platelets: 199 K/uL (ref 150–400)
RBC: 3.06 MIL/uL — ABNORMAL LOW (ref 4.22–5.81)
RBC: 3.09 MIL/uL — ABNORMAL LOW (ref 4.22–5.81)
RBC: 4.48 MIL/uL (ref 4.22–5.81)
RDW: 12.9 % (ref 11.5–15.5)
RDW: 12.9 % (ref 11.5–15.5)
RDW: 13 % (ref 11.5–15.5)
WBC: 11.7 K/uL — ABNORMAL HIGH (ref 4.0–10.5)
WBC: 12.1 K/uL — ABNORMAL HIGH (ref 4.0–10.5)
WBC: 8.8 K/uL (ref 4.0–10.5)
nRBC: 0 % (ref 0.0–0.2)
nRBC: 0 % (ref 0.0–0.2)
nRBC: 0 % (ref 0.0–0.2)

## 2024-08-23 LAB — COMPREHENSIVE METABOLIC PANEL WITH GFR
ALT: 17 U/L (ref 0–44)
AST: 33 U/L (ref 15–41)
Albumin: 3.7 g/dL (ref 3.5–5.0)
Alkaline Phosphatase: 58 U/L (ref 38–126)
Anion gap: 10 (ref 5–15)
BUN: 37 mg/dL — ABNORMAL HIGH (ref 8–23)
CO2: 25 mmol/L (ref 22–32)
Calcium: 8.9 mg/dL (ref 8.9–10.3)
Chloride: 103 mmol/L (ref 98–111)
Creatinine, Ser: 2.42 mg/dL — ABNORMAL HIGH (ref 0.61–1.24)
GFR, Estimated: 28 mL/min — ABNORMAL LOW (ref >60)
Glucose, Bld: 100 mg/dL — ABNORMAL HIGH (ref 70–99)
Potassium: 4.2 mmol/L (ref 3.5–5.1)
Sodium: 138 mmol/L (ref 135–145)
Total Bilirubin: 1 mg/dL (ref 0.0–1.2)
Total Protein: 6.9 g/dL (ref 6.5–8.1)

## 2024-08-23 LAB — GLUCOSE, CAPILLARY
Glucose-Capillary: 119 mg/dL — ABNORMAL HIGH (ref 70–99)
Glucose-Capillary: 117 mg/dL — ABNORMAL HIGH (ref 70–99)
Glucose-Capillary: 119 mg/dL — ABNORMAL HIGH (ref 70–99)
Glucose-Capillary: 93 mg/dL (ref 70–99)
Glucose-Capillary: 111 mg/dL — ABNORMAL HIGH (ref 70–99)
Glucose-Capillary: 122 mg/dL — ABNORMAL HIGH (ref 70–99)
Glucose-Capillary: 120 mg/dL — ABNORMAL HIGH (ref 70–99)
Glucose-Capillary: 164 mg/dL — ABNORMAL HIGH (ref 70–99)
Glucose-Capillary: 132 mg/dL — ABNORMAL HIGH (ref 70–99)
Glucose-Capillary: 145 mg/dL — ABNORMAL HIGH (ref 70–99)
Glucose-Capillary: 134 mg/dL — ABNORMAL HIGH (ref 70–99)

## 2024-08-23 LAB — MAGNESIUM: Magnesium: 2 mg/dL (ref 1.7–2.4)

## 2024-08-23 LAB — URINALYSIS, ROUTINE W REFLEX MICROSCOPIC
Bilirubin Urine: NEGATIVE
Glucose, UA: NEGATIVE mg/dL
Hgb urine dipstick: NEGATIVE
Ketones, ur: NEGATIVE mg/dL
Leukocytes,Ua: NEGATIVE
Nitrite: POSITIVE — AB
Protein, ur: 30 mg/dL — AB
Specific Gravity, Urine: 1.014 (ref 1.005–1.030)
pH: 8 (ref 5.0–8.0)

## 2024-08-23 LAB — APTT
aPTT: 80 s — ABNORMAL HIGH (ref 24–36)
aPTT: 37 s — ABNORMAL HIGH (ref 24–36)

## 2024-08-23 LAB — PROTIME-INR
INR: 1.3 — ABNORMAL HIGH (ref 0.8–1.2)
Prothrombin Time: 16.6 s — ABNORMAL HIGH (ref 11.4–15.2)

## 2024-08-23 LAB — LIPOPROTEIN A (LPA): Lipoprotein (a): 16.6 nmol/L (ref <75.0)

## 2024-08-23 LAB — PLATELET COUNT: Platelets: 169 K/uL (ref 150–400)

## 2024-08-23 LAB — HEMOGLOBIN AND HEMATOCRIT, BLOOD
HCT: 25.4 % — ABNORMAL LOW (ref 39.0–52.0)
Hemoglobin: 8.7 g/dL — ABNORMAL LOW (ref 13.0–17.0)

## 2024-08-23 LAB — HEPARIN LEVEL (UNFRACTIONATED): Heparin Unfractionated: 0.37 [IU]/mL (ref 0.30–0.70)

## 2024-08-23 SURGERY — CORONARY ARTERY BYPASS GRAFTING (CABG)
Anesthesia: General | Site: Chest

## 2024-08-23 MED ORDER — HEMOSTATIC AGENTS (NO CHARGE) OPTIME
TOPICAL | Status: DC | PRN
  Administered 2024-08-23: 1 via TOPICAL

## 2024-08-23 MED ORDER — FENTANYL CITRATE (PF) 250 MCG/5ML IJ SOLN
INTRAMUSCULAR | Status: AC
  Filled 2024-08-23: qty 5

## 2024-08-23 MED ORDER — 0.9 % SODIUM CHLORIDE (POUR BTL) OPTIME
TOPICAL | Status: DC | PRN
  Administered 2024-08-23: 5000 mL

## 2024-08-23 MED ORDER — PANTOPRAZOLE SODIUM 40 MG IV SOLR
40.0000 mg | Freq: Every day | INTRAVENOUS | Status: AC
Start: 2024-08-23 — End: 2024-08-25
  Administered 2024-08-23 – 2024-08-24 (×2): 40 mg via INTRAVENOUS
  Filled 2024-08-23 (×2): qty 10

## 2024-08-23 MED ORDER — PROTAMINE SULFATE 10 MG/ML IV SOLN
INTRAVENOUS | Status: AC
  Filled 2024-08-23: qty 10

## 2024-08-23 MED ORDER — ALBUMIN HUMAN 5 % IV SOLN
250.0000 mL | INTRAVENOUS | Status: AC | PRN
  Administered 2024-08-23 – 2024-08-24 (×5): 12.5 g via INTRAVENOUS
  Filled 2024-08-23 (×3): qty 250

## 2024-08-23 MED ORDER — PROPOFOL 10 MG/ML IV BOLUS
INTRAVENOUS | Status: AC
  Filled 2024-08-23: qty 20

## 2024-08-23 MED ORDER — LACTATED RINGERS IV SOLN
INTRAVENOUS | Status: AC
Start: 2024-08-23 — End: 2024-08-24

## 2024-08-23 MED ORDER — POTASSIUM CHLORIDE 10 MEQ/50ML IV SOLN: 10.0000 meq | INTRAVENOUS | Status: AC

## 2024-08-23 MED ORDER — PROTAMINE SULFATE 10 MG/ML IV SOLN
INTRAVENOUS | Status: AC
  Filled 2024-08-23: qty 25

## 2024-08-23 MED ORDER — HEPARIN SODIUM (PORCINE) 1000 UNIT/ML IJ SOLN
INTRAMUSCULAR | Status: DC | PRN
  Administered 2024-08-23: 34000 [IU] via INTRAVENOUS

## 2024-08-23 MED ORDER — ATORVASTATIN CALCIUM 80 MG PO TABS
80.0000 mg | ORAL_TABLET | Freq: Every day | ORAL | Status: DC
  Administered 2024-08-24 – 2024-08-28 (×5): 80 mg via ORAL
  Filled 2024-08-23 (×5): qty 1

## 2024-08-23 MED ORDER — ROCURONIUM BROMIDE 100 MG/10ML IV SOLN
INTRAVENOUS | Status: DC | PRN
  Administered 2024-08-23: 30 mg via INTRAVENOUS
  Administered 2024-08-23: 40 mg via INTRAVENOUS
  Administered 2024-08-23 (×2): 10 mg via INTRAVENOUS
  Administered 2024-08-23: 60 mg via INTRAVENOUS

## 2024-08-23 MED ORDER — ALBUMIN HUMAN 5 % IV SOLN: INTRAVENOUS | Status: DC | PRN

## 2024-08-23 MED ORDER — PROTAMINE SULFATE 10 MG/ML IV SOLN
INTRAVENOUS | Status: DC | PRN
  Administered 2024-08-23: 340 mg via INTRAVENOUS

## 2024-08-23 MED ORDER — CHLORHEXIDINE GLUCONATE CLOTH 2 % EX PADS
6.0000 | MEDICATED_PAD | Freq: Every day | CUTANEOUS | Status: AC
Start: 2024-08-23 — End: ?
  Administered 2024-08-23 – 2024-08-28 (×6): 6 via TOPICAL

## 2024-08-23 MED ORDER — BISACODYL 5 MG PO TBEC
10.0000 mg | DELAYED_RELEASE_TABLET | Freq: Every day | ORAL | Status: DC
  Administered 2024-08-24 – 2024-08-27 (×4): 10 mg via ORAL
  Filled 2024-08-23 (×4): qty 2

## 2024-08-23 MED ORDER — MIDAZOLAM HCL (PF) 5 MG/ML IJ SOLN
INTRAMUSCULAR | Status: DC | PRN
  Administered 2024-08-23 (×2): 1 mg via INTRAVENOUS
  Administered 2024-08-23: 2 mg via INTRAVENOUS
  Administered 2024-08-23: 1 mg via INTRAVENOUS

## 2024-08-23 MED ORDER — SODIUM CHLORIDE 0.9% FLUSH
3.0000 mL | Freq: Two times a day (BID) | INTRAVENOUS | Status: DC
  Administered 2024-08-24 – 2024-08-28 (×8): 3 mL via INTRAVENOUS

## 2024-08-23 MED ORDER — THROMBIN (RECOMBINANT) 20000 UNITS EX SOLR
CUTANEOUS | Status: AC
  Filled 2024-08-23: qty 20000

## 2024-08-23 MED ORDER — PHENYLEPHRINE 80 MCG/ML (10ML) SYRINGE FOR IV PUSH (FOR BLOOD PRESSURE SUPPORT)
PREFILLED_SYRINGE | INTRAVENOUS | Status: AC
  Filled 2024-08-23: qty 10

## 2024-08-23 MED ORDER — ACETAMINOPHEN 500 MG PO TABS
1000.0000 mg | ORAL_TABLET | Freq: Four times a day (QID) | ORAL | Status: DC
  Administered 2024-08-23: 1000 mg via ORAL
  Administered 2024-08-24: 500 mg via ORAL
  Filled 2024-08-23 (×2): qty 2

## 2024-08-23 MED ORDER — ASPIRIN 81 MG PO CHEW
324.0000 mg | CHEWABLE_TABLET | Freq: Every day | ORAL | Status: DC
  Administered 2024-08-25: 324 mg
  Filled 2024-08-23: qty 4

## 2024-08-23 MED ORDER — ASPIRIN 325 MG PO TBEC
325.0000 mg | DELAYED_RELEASE_TABLET | Freq: Every day | ORAL | Status: DC
  Administered 2024-08-24 – 2024-08-27 (×3): 325 mg via ORAL
  Filled 2024-08-23 (×3): qty 1

## 2024-08-23 MED ORDER — SODIUM CHLORIDE 0.45 % IV SOLN: INTRAVENOUS | Status: AC | PRN

## 2024-08-23 MED ORDER — MORPHINE SULFATE (PF) 2 MG/ML IV SOLN
1.0000 mg | INTRAVENOUS | Status: DC | PRN
  Administered 2024-08-23 (×2): 2 mg via INTRAVENOUS
  Administered 2024-08-24: 4 mg via INTRAVENOUS
  Administered 2024-08-25: 2 mg via INTRAVENOUS
  Administered 2024-08-25: 4 mg via INTRAVENOUS
  Administered 2024-08-25 – 2024-08-26 (×3): 2 mg via INTRAVENOUS
  Filled 2024-08-23: qty 1
  Filled 2024-08-23 (×2): qty 2
  Filled 2024-08-23 (×6): qty 1

## 2024-08-23 MED ORDER — ROCURONIUM BROMIDE 10 MG/ML (PF) SYRINGE
PREFILLED_SYRINGE | INTRAVENOUS | Status: AC
  Filled 2024-08-23: qty 20

## 2024-08-23 MED ORDER — ACETAMINOPHEN 160 MG/5ML PO SOLN: 1000.0000 mg | Freq: Four times a day (QID) | ORAL | Status: DC

## 2024-08-23 MED ORDER — SODIUM CHLORIDE 0.9% FLUSH: 3.0000 mL | INTRAVENOUS | Status: DC | PRN

## 2024-08-23 MED ORDER — ENSURE PRE-SURGERY PO LIQD
355.0000 mL | Freq: Once | ORAL | Status: AC
  Administered 2024-08-23: 355 mL via ORAL
  Filled 2024-08-23: qty 592

## 2024-08-23 MED ORDER — LACTATED RINGERS IV SOLN: INTRAVENOUS | Status: AC

## 2024-08-23 MED ORDER — SODIUM BICARBONATE 8.4 % IV SOLN
50.0000 meq | Freq: Once | INTRAVENOUS | Status: AC
  Administered 2024-08-23: 50 meq via INTRAVENOUS

## 2024-08-23 MED ORDER — ACETAMINOPHEN 160 MG/5ML PO SOLN
650.0000 mg | Freq: Once | ORAL | Status: AC
Start: 2024-08-23 — End: 2024-08-23
  Administered 2024-08-23: 650 mg
  Filled 2024-08-23: qty 20.3

## 2024-08-23 MED ORDER — NITROGLYCERIN IN D5W 200-5 MCG/ML-% IV SOLN: 0.0000 ug/min | INTRAVENOUS | Status: DC

## 2024-08-23 MED ORDER — BISACODYL 10 MG RE SUPP: 10.0000 mg | Freq: Every day | RECTAL | Status: DC

## 2024-08-23 MED ORDER — DOPAMINE-DEXTROSE 1.6-5 MG/ML-% IV SOLN
INTRAVENOUS | Status: DC | PRN
  Administered 2024-08-23: 2 ug/kg/min via INTRAVENOUS

## 2024-08-23 MED ORDER — PROPOFOL 10 MG/ML IV BOLUS
INTRAVENOUS | Status: DC | PRN
  Administered 2024-08-23: 20 mg via INTRAVENOUS

## 2024-08-23 MED ORDER — METOCLOPRAMIDE HCL 5 MG/ML IJ SOLN
10.0000 mg | Freq: Four times a day (QID) | INTRAMUSCULAR | Status: AC
  Administered 2024-08-23 – 2024-08-24 (×6): 10 mg via INTRAVENOUS
  Filled 2024-08-23 (×6): qty 2

## 2024-08-23 MED ORDER — LACTATED RINGERS IV SOLN: INTRAVENOUS | Status: DC | PRN

## 2024-08-23 MED ORDER — INSULIN REGULAR(HUMAN) IN NACL 100-0.9 UT/100ML-% IV SOLN: INTRAVENOUS | Status: DC

## 2024-08-23 MED ORDER — DEXTROSE 50 % IV SOLN: 0.0000 mL | INTRAVENOUS | Status: DC | PRN

## 2024-08-23 MED ORDER — METOPROLOL TARTRATE 5 MG/5ML IV SOLN
2.5000 mg | INTRAVENOUS | Status: DC | PRN
  Filled 2024-08-23: qty 5

## 2024-08-23 MED ORDER — PHENYLEPHRINE HCL (PRESSORS) 10 MG/ML IV SOLN
INTRAVENOUS | Status: DC | PRN
  Administered 2024-08-23: 240 ug via INTRAVENOUS
  Administered 2024-08-23 (×2): 40 ug via INTRAVENOUS
  Administered 2024-08-23: 80 ug via INTRAVENOUS
  Administered 2024-08-23: 160 ug via INTRAVENOUS

## 2024-08-23 MED ORDER — DOCUSATE SODIUM 100 MG PO CAPS
200.0000 mg | ORAL_CAPSULE | Freq: Every day | ORAL | Status: DC
  Administered 2024-08-24 – 2024-08-27 (×4): 200 mg via ORAL
  Filled 2024-08-23 (×5): qty 2

## 2024-08-23 MED ORDER — SODIUM CHLORIDE 0.9 % IV SOLN: 250.0000 mL | INTRAVENOUS | Status: AC

## 2024-08-23 MED ORDER — HEPARIN SODIUM (PORCINE) 1000 UNIT/ML IJ SOLN
INTRAMUSCULAR | Status: AC
  Filled 2024-08-23: qty 1

## 2024-08-23 MED ORDER — CEFAZOLIN SODIUM-DEXTROSE 2-4 GM/100ML-% IV SOLN
2.0000 g | Freq: Three times a day (TID) | INTRAVENOUS | Status: AC
  Administered 2024-08-23 – 2024-08-25 (×6): 2 g via INTRAVENOUS
  Filled 2024-08-23 (×6): qty 100

## 2024-08-23 MED ORDER — MIDAZOLAM HCL 2 MG/2ML IJ SOLN: 2.0000 mg | INTRAMUSCULAR | Status: DC | PRN

## 2024-08-23 MED ORDER — THROMBIN 20000 UNITS EX SOLR: CUTANEOUS | Status: DC | PRN

## 2024-08-23 MED ORDER — CHLORHEXIDINE GLUCONATE 0.12 % MT SOLN
15.0000 mL | OROMUCOSAL | Status: AC
  Administered 2024-08-23: 15 mL via OROMUCOSAL
  Filled 2024-08-23: qty 15

## 2024-08-23 MED ORDER — SODIUM CHLORIDE 0.9 % IV SOLN: INTRAVENOUS | Status: DC | PRN

## 2024-08-23 MED ORDER — TRAMADOL HCL 50 MG PO TABS
50.0000 mg | ORAL_TABLET | ORAL | Status: DC | PRN
  Administered 2024-08-24: 100 mg via ORAL
  Filled 2024-08-23: qty 2

## 2024-08-23 MED ORDER — DEXMEDETOMIDINE HCL IN NACL 400 MCG/100ML IV SOLN
0.0000 ug/kg/h | INTRAVENOUS | Status: DC
  Administered 2024-08-23: 0.7 ug/kg/h via INTRAVENOUS
  Filled 2024-08-23: qty 100

## 2024-08-23 MED ORDER — DOPAMINE-DEXTROSE 3.2-5 MG/ML-% IV SOLN: 2.0000 ug/kg/min | INTRAVENOUS | Status: DC

## 2024-08-23 MED ORDER — HEPARIN SODIUM (PORCINE) 1000 UNIT/ML IJ SOLN
INTRAMUSCULAR | Status: AC
  Filled 2024-08-23: qty 10

## 2024-08-23 MED ORDER — METOPROLOL TARTRATE 12.5 MG HALF TABLET
12.5000 mg | ORAL_TABLET | Freq: Two times a day (BID) | ORAL | Status: DC
  Administered 2024-08-24 – 2024-08-26 (×5): 12.5 mg via ORAL
  Filled 2024-08-23 (×5): qty 1

## 2024-08-23 MED ORDER — OXYCODONE HCL 5 MG PO TABS
5.0000 mg | ORAL_TABLET | ORAL | Status: DC | PRN
  Administered 2024-08-24 (×2): 5 mg via ORAL
  Filled 2024-08-23 (×2): qty 1

## 2024-08-23 MED ORDER — PANTOPRAZOLE SODIUM 40 MG PO TBEC
40.0000 mg | DELAYED_RELEASE_TABLET | Freq: Every day | ORAL | Status: DC
  Administered 2024-08-25 – 2024-08-28 (×4): 40 mg via ORAL
  Filled 2024-08-23 (×4): qty 1

## 2024-08-23 MED ORDER — FENTANYL CITRATE (PF) 100 MCG/2ML IJ SOLN
INTRAMUSCULAR | Status: DC | PRN
  Administered 2024-08-23 (×4): 50 ug via INTRAVENOUS
  Administered 2024-08-23: 700 ug via INTRAVENOUS
  Administered 2024-08-23: 50 ug via INTRAVENOUS

## 2024-08-23 MED ORDER — PHENYLEPHRINE HCL-NACL 20-0.9 MG/250ML-% IV SOLN: 0.0000 ug/min | INTRAVENOUS | Status: DC

## 2024-08-23 MED ORDER — METOPROLOL TARTRATE 25 MG/10 ML ORAL SUSPENSION: 12.5000 mg | Freq: Two times a day (BID) | ORAL | Status: DC

## 2024-08-23 MED ORDER — ENSURE PRE-SURGERY PO LIQD
296.0000 mL | Freq: Once | ORAL | Status: DC
  Filled 2024-08-23: qty 296

## 2024-08-23 MED ORDER — MIDAZOLAM HCL (PF) 10 MG/2ML IJ SOLN
INTRAMUSCULAR | Status: AC
  Filled 2024-08-23: qty 2

## 2024-08-23 MED ORDER — VANCOMYCIN HCL IN DEXTROSE 1-5 GM/200ML-% IV SOLN
1000.0000 mg | Freq: Once | INTRAVENOUS | Status: AC
Start: 2024-08-23 — End: 2024-08-23
  Administered 2024-08-23: 1000 mg via INTRAVENOUS
  Filled 2024-08-23: qty 200

## 2024-08-23 MED ORDER — ONDANSETRON HCL 4 MG/2ML IJ SOLN
4.0000 mg | Freq: Four times a day (QID) | INTRAMUSCULAR | Status: DC | PRN
  Administered 2024-08-25: 4 mg via INTRAVENOUS
  Filled 2024-08-23: qty 2

## 2024-08-23 MED ORDER — MAGNESIUM SULFATE 4 GM/100ML IV SOLN
4.0000 g | Freq: Once | INTRAVENOUS | Status: DC
  Filled 2024-08-23: qty 100

## 2024-08-23 MED ORDER — SODIUM CHLORIDE 0.9 % IV SOLN: INTRAVENOUS | Status: AC

## 2024-08-23 MED ORDER — ASPIRIN 81 MG PO CHEW
324.0000 mg | CHEWABLE_TABLET | Freq: Once | ORAL | Status: AC
Start: 2024-08-23 — End: 2024-08-23
  Administered 2024-08-23: 324 mg via ORAL
  Filled 2024-08-23: qty 4

## 2024-08-23 MED ORDER — PLASMA-LYTE A IV SOLN: INTRAVENOUS | Status: DC | PRN

## 2024-08-23 SURGICAL SUPPLY — 81 items
BAG DECANTER FOR FLEXI CONT (MISCELLANEOUS) ×2 IMPLANT
BLADE CLIPPER SURG (BLADE) ×2 IMPLANT
BLADE STERNUM SYSTEM 6 (BLADE) ×2 IMPLANT
BNDG ELASTIC 4INX 5YD STR LF (GAUZE/BANDAGES/DRESSINGS) IMPLANT
BNDG ELASTIC 4X5.8 VLCR STR LF (GAUZE/BANDAGES/DRESSINGS) ×2 IMPLANT
BNDG ELASTIC 6INX 5YD STR LF (GAUZE/BANDAGES/DRESSINGS) ×2 IMPLANT
BNDG GAUZE DERMACEA FLUFF 4 (GAUZE/BANDAGES/DRESSINGS) ×2 IMPLANT
CANISTER SUCTION 3000ML PPV (SUCTIONS) ×2 IMPLANT
CANNULA AORTIC ROOT 9FR (CANNULA) ×2 IMPLANT
CANNULA ARTERIAL NVNT 3/8 20FR (MISCELLANEOUS) IMPLANT
CANNULA MC2 2 STG 36/46 NON-V (CANNULA) IMPLANT
CATH ROBINSON RED A/P 18FR (CATHETERS) ×4 IMPLANT
CATH THORACIC 28FR (CATHETERS) ×2 IMPLANT
CATH THORACIC 36FR (CATHETERS) ×2 IMPLANT
CATH THORACIC 36FR RT ANG (CATHETERS) ×2 IMPLANT
CLIP TI WIDE RED SMALL 24 (CLIP) IMPLANT
CONTAINER PROTECT SURGISLUSH (MISCELLANEOUS) ×4 IMPLANT
DERMABOND ADVANCED .7 DNX12 (GAUZE/BANDAGES/DRESSINGS) IMPLANT
DRAPE SRG 135X102X78XABS (DRAPES) ×2 IMPLANT
DRAPE WARM FLUID 44X44 (DRAPES) ×2 IMPLANT
DRSG COVADERM 4X14 (GAUZE/BANDAGES/DRESSINGS) ×2 IMPLANT
ELECT CAUTERY BLADE 6.4 (BLADE) ×2 IMPLANT
ELECTRODE BLDE 4.0 EZ CLN MEGD (MISCELLANEOUS) ×2 IMPLANT
ELECTRODE REM PT RTRN 9FT ADLT (ELECTROSURGICAL) ×4 IMPLANT
FELT TEFLON 1X6 (MISCELLANEOUS) ×2 IMPLANT
GAUZE SPONGE 4X4 12PLY STRL (GAUZE/BANDAGES/DRESSINGS) ×4 IMPLANT
GLOVE BIO SURGEON STRL SZ 6 (GLOVE) IMPLANT
GLOVE BIO SURGEON STRL SZ 6.5 (GLOVE) IMPLANT
GLOVE BIOGEL PI IND STRL 6.5 (GLOVE) IMPLANT
GLOVE BIOGEL PI IND STRL 7.0 (GLOVE) IMPLANT
GLOVE ECLIPSE 7.0 STRL STRAW (GLOVE) ×4 IMPLANT
GLOVE ORTHO TXT STRL SZ7.5 (GLOVE) IMPLANT
GOWN STRL REUS W/ TWL LRG LVL3 (GOWN DISPOSABLE) ×8 IMPLANT
GOWN STRL REUS W/ TWL XL LVL3 (GOWN DISPOSABLE) ×2 IMPLANT
HEMOSTAT POWDER SURGIFOAM 1G (HEMOSTASIS) ×6 IMPLANT
HEMOSTAT SURGICEL 2X14 (HEMOSTASIS) ×2 IMPLANT
KIT BASIN OR (CUSTOM PROCEDURE TRAY) ×2 IMPLANT
KIT SUCTION CATH 14FR (SUCTIONS) ×2 IMPLANT
KIT TURNOVER KIT B (KITS) ×2 IMPLANT
KIT VASOVIEW HEMOPRO 2 VH 4000 (KITS) ×2 IMPLANT
KNIFE MICRO-UNI 3.5 30 DEG (BLADE) ×2 IMPLANT
PACK E OPEN HEART (SUTURE) ×2 IMPLANT
PACK OPEN HEART (CUSTOM PROCEDURE TRAY) ×2 IMPLANT
PAD ARMBOARD POSITIONER FOAM (MISCELLANEOUS) ×4 IMPLANT
PAD ELECT DEFIB RADIOL ZOLL (MISCELLANEOUS) ×2 IMPLANT
PENCIL BUTTON HOLSTER BLD 10FT (ELECTRODE) ×2 IMPLANT
POSITIONER HEAD DONUT 9IN (MISCELLANEOUS) ×2 IMPLANT
PUNCH AORTIC ROTATE 4.5MM 8IN (MISCELLANEOUS) ×2 IMPLANT
SET MPS 3-ND DEL (MISCELLANEOUS) IMPLANT
SOLN 0.9% NACL 1000 ML (IV SOLUTION) ×10 IMPLANT
SOLN 0.9% NACL POUR BTL 1000ML (IV SOLUTION) ×10 IMPLANT
SOLN STERILE WATER 1000 ML (IV SOLUTION) ×4 IMPLANT
SOLN STERILE WATER BTL 1000 ML (IV SOLUTION) ×4 IMPLANT
SUPPORT HEART JANKE-BARRON (MISCELLANEOUS) ×2 IMPLANT
SUT BONE WAX W31G (SUTURE) ×2 IMPLANT
SUT MNCRL AB 4-0 PS2 18 (SUTURE) IMPLANT
SUT PROLENE 3 0 SH1 36 (SUTURE) ×2 IMPLANT
SUT PROLENE 4 0 SH DA (SUTURE) IMPLANT
SUT PROLENE 4-0 RB1 .5 CRCL 36 (SUTURE) IMPLANT
SUT PROLENE 5 0 C 1 36 (SUTURE) IMPLANT
SUT PROLENE 6 0 C 1 30 (SUTURE) IMPLANT
SUT PROLENE 7 0 BV 1 (SUTURE) IMPLANT
SUT PROLENE 7 0 BV1 MDA (SUTURE) ×2 IMPLANT
SUT PROLENE 8 0 BV175 6 (SUTURE) IMPLANT
SUT SILK 1 MH (SUTURE) IMPLANT
SUT STEEL 6MS V (SUTURE) IMPLANT
SUT STEEL STERNAL CCS#1 18IN (SUTURE) IMPLANT
SUT STEEL SZ 6 DBL 3X14 BALL (SUTURE) IMPLANT
SUT VIC AB 1 CTX36XBRD ANBCTR (SUTURE) ×4 IMPLANT
SUT VIC AB 2-0 CT1 TAPERPNT 27 (SUTURE) IMPLANT
SUT VIC AB 3-0 X1 27 (SUTURE) IMPLANT
SYSTEM SAHARA CHEST DRAIN ATS (WOUND CARE) ×2 IMPLANT
TAPE CLOTH SURG 4X10 WHT LF (GAUZE/BANDAGES/DRESSINGS) IMPLANT
TAPE PAPER 2X10 WHT MICROPORE (GAUZE/BANDAGES/DRESSINGS) IMPLANT
TOWEL GREEN STERILE (TOWEL DISPOSABLE) ×2 IMPLANT
TOWEL GREEN STERILE FF (TOWEL DISPOSABLE) ×2 IMPLANT
TRAY FOLEY SLVR 16FR TEMP STAT (SET/KITS/TRAYS/PACK) ×2 IMPLANT
TUBE SUCT INTRACARD DLP 20F (MISCELLANEOUS) ×2 IMPLANT
TUBE SUCTION CARDIAC 10FR (CANNULA) ×2 IMPLANT
TUBING LAP HI FLOW INSUFFLATIO (TUBING) ×2 IMPLANT
UNDERPAD 30X36 HEAVY ABSORB (UNDERPADS AND DIAPERS) ×2 IMPLANT

## 2024-08-23 NOTE — Telephone Encounter (Signed)
 Patient Product/process development scientist completed.    The patient is insured through [facility]. Patient has Medicare and is not eligible for a copay card, but may be able to apply for patient assistance or Medicare RX Payment Plan (Patient Must reach out to their plan, if eligible for payment plan), if available.    Ran test claim for prasugrel 10 mg and the current 30 day co-pay is $36.22.  Ran test claim for Brilinta 90mg  and the current 30 day co-pay is $302.00 due to a $255.00 deductible.  Will be $47.00 once deductible is met.  Ran test claim for Entresto 24-26 mg and the current 30 day co-pay is $302.00 due to a $255.00 deductible.  Will be $47.00 once deductible is met.  [facility]- copay amounts may vary at other pharmacies due to pharmacy/plan contracts, or as the patient moves through the different stages of their insurance plan.     Reyes Sharps, CPHT Pharmacy Technician III Certified Patient Advocate [facility] Patient Advocate Team Direct Number: [phone]  Fax: [phone]

## 2024-08-23 NOTE — Anesthesia Procedure Notes (Signed)
 Procedure Name: Intubation Date/Time: 08/23/2024 7:49 AM  Performed by: Lamar Lucie DASEN, CRNAPre-anesthesia Checklist: Patient identified, Emergency Drugs available, Suction available and Patient being monitored Patient Re-evaluated:Patient Re-evaluated prior to induction Oxygen Delivery Method: Circle system utilized Preoxygenation: Pre-oxygenation with 100% oxygen Induction Type: IV induction Ventilation: Mask ventilation without difficulty Laryngoscope Size: Mac and 4 Grade View: Grade II Tube type: Oral Tube size: 8.0 mm Number of attempts: 1 Airway Equipment and Method: Stylet and Oral airway Placement Confirmation: ETT inserted through vocal cords under direct vision, positive ETCO2 and breath sounds checked- equal and bilateral Secured at: 23 cm Tube secured with: Tape Dental Injury: Teeth and Oropharynx as per pre-operative assessment

## 2024-08-23 NOTE — Discharge Summary (Signed)
 Physician Discharge Summary  Patient ID: Adam Chung MRN: [id number] DOB/AGE: 01/30/1951 73 y.o.  Admit date: 08/21/2024 Discharge date: 08/28/2024  Admission Diagnoses:  Acute STEMI (ST elevation myocardial infarction)  Type 2 diabetes mellitus with complication, with long-term current use of insulin   Chronic kidney disease, stage 3b History of bladder cancer  Discharge Diagnoses:   Acute STEMI (ST elevation myocardial infarction)  Type 2 diabetes mellitus with complication, with long-term current use of insulin   Chronic kidney disease, stage 3b History of bladder cancer S/P CABG x 2 Expected acute blood loss anemia  Discharged Condition: Stable  Referring: Dr. Elmira, MD Primary Care: Adam Oneil FALCON, MD Primary Cardiologist:None  History of Present Illness:     This is a 73 year old male with a past medical history of hypertension, diabetes mellitus, CKD (stage IIIb), very remote tobacco use (quit over 35 years ago), bladder cancer (s/p cystoprostatectomy with ileal conduit) who presented to [facility] via EMS with complaints of chest pain on 08/21/2024. 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    EKG showed ST elevation in leads V1-3 with reciprocal ST depression. Initial Troponin I (high sensitivity) was 166 and max thus far is 3,912. He ruled in for a STEMI.  Cardiac catheterization done 08/21/2024 showed mid LAD with an 85% stenosis, 3rd Diagonal with an 80% stenosis, mid to distal Circumflex with a 90%stenosis, and OM3 with a 100% stenosis. Echocardiogram has done earlier today showed LVEF 40-45%, regional wall motion abnormalities (akinesis of the distal septum, distal inferior wall and apex), trivial MR and TR, and cannot rule out apical  thrombus.Cardiothoracic surgery has been consulted for consideration of coronary artery bypass grafting surgery. At the time of my exam, patient denied arm pain, chest pain or shortness of breath.    Patient lives alone. 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 He has a supportive daughter.  His daughter and son in law at bedside.  [facility] Chung was evaluated by Dr. Lucas who felt the patient was an appropriate candidate for coronary bypass grafting.  The procedure was explained to the patient and his family and he decided to proceed with surgery.  He remained stable following left heart catheterization.  Hospital Course: Mr. Adam Chung was taken to the operating room on 08/23/2024 where two-vessel coronary bypass grafting was accomplished.  The left internal mammary artery was grafted to the left anterior descending coronary artery and an endoscopically harvested saphenous vein graft was placed to the diagonal coronary artery. Following the procedure he separated from cardiopulmonary bypass without any difficulty and was transferred to the ICU in stable condition. He was extubated the evening of surgery without complication. He had stage 3b CKD with baseline Cr of 2, Cr remained stable at 2.19. Drips were weaned as hemodynamics tolerated. Swan ganz catheter, arterial line, and epicardial pacing wires were removed without complication. Insulin  drip was transitioned to Lantus  and SSI.  The patient's chest tube output decreased and his left sided chest tube was removed on 08/26/2024.  He developed a slight increase in his creatinine due to administration of Lasix , resulting in holding further administration. Creatinine trended back toward baseline.  He was able to wean from the supplemental O2 without difficulty.   He remained  in NSR and was felt stable for transfer to the progressive care unit on 08/26/2024.  Diet was advanced routinely and well tolerated.  He had a bowel movement. Glucose was monitored and  insulin  coverage adjusted accordingly.  Sternal and RLE wounds continued to heal without signs of infection. As discussed with Dr. Lucas, will start Plavix  (had STEMI) and decrease ec asa to 81 mg daily. He is felt stable for discharge today.    Consults: None  Significant Diagnostic Studies:  LEFT HEART CATH AND CORONARY ANGIOGRAPHY   Conclusion      Prox RCA-1 lesion is 45% stenosed.   Prox RCA-2 lesion is 20% stenosed.   Mid Cx to Dist Cx lesion is 90% stenosed.   3rd Mrg lesion is 100% stenosed.   Mid LAD lesion is 85% stenosed.   3rd Diag lesion is 80% stenosed.   LV end diastolic pressure is moderately elevated.   2 vessel obstructive CAD. There is a complex bifurcation stenosis involving the LAD and third diagonal branch which is fairly large. Medina class 1,1,0.  There is severe disease in the distal LCx  Moderately elevated LVEDP 28 mm Hg    ECHOCARDIOGRAM REPORT       Patient Name:   Adam Chung Date of Exam: 08/22/2024 Medical Rec #:  [id number]         Height:       71.0 in Accession #:    [id number]        Weight:       188.1 lb Date of Birth:  [birth year]         BSA:          2.054 m Patient Age:    73 years          BP:           134/69 mmHg Patient Gender: M                 HR:           82 bpm. Exam Location:  Inpatient  Procedure: 2D Echo, Cardiac Doppler and Color Doppler (Both Spectral and Color            Flow Doppler were utilized during procedure).  Indications:    Chest Pain R07.9   History:        Patient has prior history of Echocardiogram examinations, most                 recent 09/21/2021. CAD; Risk Factors:Diabetes.   Sonographer:    [facility] Referring Phys: [phone] [facility]  IMPRESSIONS    1. Akinesis of the distal septum, distal inferior wall and apex with overall mild LV dysfunction; cannot R/O apical thrombus; suggest cardiac MRI or repeat limited echo with contrast to further assess.  2. Left ventricular ejection  fraction, by estimation, is 40 to 45%. The left ventricle has mildly decreased function. The left ventricle demonstrates regional wall motion abnormalities (see scoring diagram/findings for description). There is mild left ventricular hypertrophy of the basal-septal segment. Left ventricular diastolic parameters are consistent with Grade I diastolic dysfunction (impaired relaxation).  3. Right ventricular systolic function is normal. The right ventricular size is normal.  4. The mitral valve is normal in structure. Trivial mitral valve regurgitation. No evidence of mitral stenosis.  5. The aortic valve is tricuspid. Aortic valve regurgitation is not visualized. No aortic stenosis is present.  FINDINGS  Left Ventricle: Left ventricular ejection fraction, by estimation,  is 40 to 45%. The left ventricle has mildly decreased function. The left ventricle demonstrates regional wall motion abnormalities. The left ventricular internal cavity size was normal in size. There is mild left ventricular hypertrophy of the basal-septal segment. Left ventricular diastolic parameters are consistent with Grade I diastolic dysfunction (impaired relaxation).  Right Ventricle: The right ventricular size is normal. Right ventricular systolic function is normal.  Left Atrium: Left atrial size was normal in size.  Right Atrium: Right atrial size was normal in size.  Pericardium: There is no evidence of pericardial effusion.  Mitral Valve: The mitral valve is normal in structure. Trivial mitral valve regurgitation. No evidence of mitral valve stenosis.  Tricuspid Valve: The tricuspid valve is normal in structure. Tricuspid valve regurgitation is trivial. No evidence of tricuspid stenosis.  Aortic Valve: The aortic valve is tricuspid. Aortic valve regurgitation is not visualized. No aortic stenosis is present. Aortic valve mean gradient measures 4.0 mmHg. Aortic valve peak gradient measures 4.8 mmHg.  Aortic valve area, by VTI measures 3.25 cm.  Pulmonic Valve: The pulmonic valve was not well visualized. Pulmonic valve regurgitation is not visualized. No evidence of pulmonic stenosis.  Aorta: The aortic root is normal in size and structure.  Venous: The inferior vena cava was not well visualized.  IAS/Shunts: The interatrial septum was not well visualized.  Additional Comments: Akinesis of the distal septum, distal inferior wall and apex with overall mild LV dysfunction; cannot R/O apical thrombus; suggest cardiac MRI or repeat limited echo with contrast to further assess.    LEFT VENTRICLE PLAX 2D LVIDd:         4.00 cm   Diastology LVIDs:         3.00 cm   LV e' medial:    6.31 cm/s LV PW:         0.60 cm   LV E/e' medial:  8.2 LV IVS:        0.70 cm   LV e' lateral:   7.18 cm/s LVOT diam:     2.00 cm   LV E/e' lateral: 7.2 LV SV:         59 LV SV Index:   29 LVOT Area:     3.14 cm    RIGHT VENTRICLE RV S prime:     13.50 cm/s TAPSE (M-mode): 2.0 cm  LEFT ATRIUM             Index        RIGHT ATRIUM          Index LA Vol (A2C):   38.4 ml 18.69 ml/m  RA Area:     7.61 cm LA Vol (A4C):   34.6 ml 16.84 ml/m  RA Volume:   14.10 ml 6.86 ml/m LA Biplane Vol: 36.9 ml 17.96 ml/m  AORTIC VALVE AV Area (Vmax):    2.85 cm AV Area (Vmean):   2.46 cm AV Area (VTI):     3.25 cm AV Vmax:           110.00 cm/s AV Vmean:          92.900 cm/s AV VTI:            0.182 m AV Peak Grad:      4.8 mmHg AV Mean Grad:      4.0 mmHg LVOT Vmax:         99.80 cm/s LVOT Vmean:        72.700 cm/s LVOT VTI:  0.188 m LVOT/AV VTI ratio: 1.03   AORTA Ao Root diam: 3.40 cm Ao Asc diam:  3.30 cm  MITRAL VALVE MV Area (PHT): 2.48 cm     SHUNTS MV Decel Time: 306 msec     Systemic VTI:  0.19 m MV E velocity: 51.90 cm/s   Systemic Diam: 2.00 cm MV A velocity: 115.00 cm/s MV E/A ratio:  0.45  Redell Shallow MD Electronically signed by Redell Shallow MD Signature  Date/Time: 08/22/2024/1:00:39 PM       Final     Treatments: surgery: 08/23/2024   Surgeon:  Dorise LOIS Fellers, MD   First Assistant: Laurel Becket,  PA-C:   An experienced assistant was required given the complexity of this surgery and the standard of surgical care. The assistant was needed for endoscopic vein harvest, exposure, dissection, suctioning, retraction of delicate tissues and sutures, instrument exchange and for overall help during this procedure.     Preoperative Diagnosis:  Severe multi-vessel coronary artery disease     Postoperative Diagnosis:  Same     Procedure:   Median Sternotomy Extracorporeal circulation 3.   Coronary artery bypass grafting x 2   Left internal mammary artery graft to the LAD SVG to diagonal 3     4.   Endoscopic vein harvest from the right leg    Discharge Exam: Blood pressure (!) 151/72, pulse 88, temperature 98.1 F (36.7 C), temperature source Oral, resp. rate (!) 22, height 5' 11 (1.803 m), weight 84.7 kg, SpO2 97%. Cardiovascular: RRR Pulmonary: Clear to auscultation bilaterally Abdomen: Soft, non tender, bowel sounds present. Extremities: No lower extremity edema. Wounds: Sternal and RLE wounds are clean and dry.  No erythema or signs of infection.  Disposition: Discharge disposition: 01-Home or Self Care      Discharge Instructions     Amb Referral to Cardiac Rehabilitation   Complete by: As directed    Diagnosis:  CABG STEMI     CABG X ___: 2   After initial evaluation and assessments completed: Virtual Based Care may be provided alone or in conjunction with Phase 2 Cardiac Rehab based on patient barriers.: Yes   Intensive Cardiac Rehabilitation (ICR) [facility] location only OR Traditional Cardiac Rehabilitation (TCR) *If criteria for ICR are not met will enroll in TCR ([facility] only): Yes      Allergies as of 08/28/2024       Reactions   Apple Juice Anaphylaxis   Benicar  [olmesartan ] Other (See Comments)   Acute  kidney injury   Penicillins Swelling   Angioedema         Medication List     STOP taking these medications    amLODipine  5 MG tablet Commonly known as: NORVASC    ibuprofen 200 MG tablet Commonly known as: ADVIL       TAKE these medications    aspirin  EC 81 MG tablet Take 1 tablet (81 mg total) by mouth daily. Swallow whole.   atorvastatin  80 MG tablet Commonly known as: LIPITOR Take 1 tablet (80 mg total) by mouth daily.   clopidogrel  75 MG tablet Commonly known as: PLAVIX  Take 1 tablet (75 mg total) by mouth daily.   Levemir  FlexTouch 100 UNIT/ML FlexTouch Pen Generic drug: insulin  detemir Inject 32 Units into the skin daily.   metoprolol  tartrate 25 MG tablet Commonly known as: LOPRESSOR  Take 1 tablet (25 mg total) by mouth 2 (two) times daily.   OVER THE COUNTER MEDICATION Take 1 tablet by mouth daily as needed (allergies, eye irritation).  OTC unknown non-drowsy allergy relief, blue tablet   Ozempic (0.25 or 0.5 MG/DOSE) 2 MG/3ML Sopn Generic drug: Semaglutide(0.25 or 0.5MG /DOS) Inject 0.5 mg into the skin every Monday.   traMADol  50 MG tablet Commonly known as: ULTRAM  Take 1 tablet (50 mg total) by mouth every 12 (twelve) hours as needed for moderate pain (pain score 4-6).   Vitamin D3 50 MCG (2000 UT) capsule Take 8,000 Units by mouth daily.        Follow-up Information     Rutha Manuelita HERO, PA-C. Go on 09/05/2024.   Specialties: Physician Assistant, Thoracic Surgery Why: Your appointment is at 10:30am. Please get a chest xray at 9:30AM on the 2nd floor of the same building. Chest tube sutures will also be removed at this office visit Contact information: [address], [place] [address] [phone]         Vicci Rollo SAUNDERS, PA-C Follow up on 09/12/2024.   Specialty: Cardiology Why: Cardiology appointment is at 1:55PM Contact information: [address] [place] [address] [phone]          [facility]  Follow up.   Why: [facility] office made referral for [facility] needs- they will contact you to follow up and schedule Contact information: [address] [place] [address] [phone]                 The patient has been discharged on:   1.Beta Blocker:  Yes [  X ]                              No   [   ]                              If No, reason:  2.Ace Inhibitor/ARB: Yes [   ]                                     No  [  x  ]                                     If No, reason:CKD (stage IIIb)  3.Statin:   Yes [ x ]                  No  [   ]                  If No, reason:  4.Ecasa:  Yes  [ x ]                  No   [   ]                  If No, reason:  5. ACS on Admission?  YES  P2Y12 Inhibitor:  Yes  [   x]                                No  [  ]    Signed: Kyla HERO Donald, PA-C 08/28/2024, 8:11 AM

## 2024-08-23 NOTE — Anesthesia Procedure Notes (Signed)
 Arterial Line Insertion Start/End9/25/2025 7:12 AM Performed by: Lamar Lucie DASEN, CRNA, CRNA  Patient location: Pre-op. Preanesthetic checklist: patient identified, IV checked, site marked, risks and benefits discussed, surgical consent, monitors and equipment checked, pre-op evaluation, timeout performed and anesthesia consent Lidocaine  1% used for infiltration Left, radial was placed Catheter size: 20 G Hand hygiene performed  and maximum sterile barriers used   Attempts: 2 Procedure performed using ultrasound guided technique. Following insertion, dressing applied and Biopatch. Post procedure assessment: normal and unchanged  Patient tolerated the procedure well with no immediate complications.

## 2024-08-23 NOTE — Anesthesia Postprocedure Evaluation (Signed)
 Anesthesia Post Note  Patient: [facility]  Procedure(s) Performed: CORONARY ARTERY BYPASS GRAFTING (CABG) TIMES TWO USING LEFT INTERNAL MAMMARY ARTERY AND ENDOSCOPICALLY HARVESTED RIGHT GREATER SAPHENOUS VEIN (Chest) ECHOCARDIOGRAM, TRANSESOPHAGEAL, INTRAOPERATIVE     Patient location during evaluation: SICU Anesthesia Type: General Level of consciousness: sedated and patient remains intubated per anesthesia plan Pain management: pain level controlled Vital Signs Assessment: post-procedure vital signs reviewed and stable Respiratory status: patient remains intubated per anesthesia plan and patient on ventilator - see flowsheet for VS (beginning to wean from vent) Cardiovascular status: stable (off of Phenylephrine , on NTG presently for BP control) Postop Assessment: no apparent nausea or vomiting Anesthetic complications: no Comments: Doing well post op   No notable events documented.  Last Vitals:  Vitals:   08/23/24 1555 08/23/24 1600  BP:  118/76  Pulse: 77 86  Resp: 16 (!) 27  Temp: 36.9 C 36.9 C  SpO2: 100% 97%    Last Pain:  Vitals:   08/23/24 0400  TempSrc:   PainSc: 0-No pain                 Ridley Schewe,E. Elby Blackwelder

## 2024-08-23 NOTE — Op Note (Signed)
 CARDIOVASCULAR SURGERY OPERATIVE NOTE  08/23/2024  Surgeon:  Dorise LOIS Fellers, MD  First Assistant: Laurel Becket,  PA-C:   An experienced assistant was required given the complexity of this surgery and the standard of surgical care. The assistant was needed for endoscopic vein harvest, exposure, dissection, suctioning, retraction of delicate tissues and sutures, instrument exchange and for overall help during this procedure.   Preoperative Diagnosis:  Severe multi-vessel coronary artery disease   Postoperative Diagnosis:  Same   Procedure:  Median Sternotomy Extracorporeal circulation 3.   Coronary artery bypass grafting x 2  Left internal mammary artery graft to the LAD SVG to diagonal 3   4.   Endoscopic vein harvest from the right leg   Anesthesia:  General Endotracheal   Clinical History/Surgical Indication:  He has a tight proximal to mid LAD/diagonal bifurcation stenosis and high grade disease in small distal LCX presenting with first episode of chest pain and STEMI. LVEF 40-45% by echo. ECG changes resolved and he remains pain free. He has baseline stage 3b CKD with creat around 2. I agree that CABG to the LAD and diag is the best treatment for him. I discussed the operative procedure with the patient and daughter including alternatives, benefits and risks; including but not limited to bleeding, blood transfusion, infection, stroke, myocardial infarction, graft failure, heart block requiring a permanent pacemaker, organ dysfunction, and death.  Adam Chung understands and agrees to proceed.   Preparation:  The patient was seen in the preoperative holding area and the correct patient, correct operation were confirmed with the patient after reviewing the medical record and catheterization. The consent was signed by me. Preoperative antibiotics were given. A pulmonary  arterial line and radial arterial line were placed by the anesthesia team. The patient was taken back to the operating room and positioned supine on the operating room table. After being placed under general endotracheal anesthesia by the anesthesia team a foley catheter was placed. The neck, chest, abdomen, and both legs were prepped with betadine soap and solution and draped in the usual sterile manner. A surgical time-out was taken and the correct patient and operative procedure were confirmed with the nursing and anesthesia staff.   Cardiopulmonary Bypass:  A median sternotomy was performed. The pericardium was opened in the midline. Right ventricular function appeared normal. The ascending aorta was of normal size and had no palpable plaque. There were no contraindications to aortic cannulation or cross-clamping. The patient was fully systemically heparinized and the ACT was maintained > 400 sec. The proximal aortic arch was cannulated with a 20 F aortic cannula for arterial inflow. Venous cannulation was performed via the right atrial appendage using a two-staged venous cannula. An antegrade cardioplegia/vent cannula was inserted into the mid-ascending aorta. Aortic occlusion was performed with a single cross-clamp. Systemic cooling to 32 degrees Centigrade and topical cooling of the heart with iced saline were used. Hyperkalemic antegrade cold blood cardioplegia was used to induce diastolic arrest and was then given at about 20 minute intervals throughout the period of arrest to maintain myocardial temperature at or below 10 degrees centigrade. A temperature probe was inserted into the interventricular septum and an insulating pad was placed in the pericardium.   Left internal mammary artery harvest:  The left side of the sternum was retracted using the Rultract retractor. The left internal mammary artery was harvested as a pedicle graft. All side branches were clipped. It was a medium-sized vessel  of good quality with excellent blood flow.  It was ligated distally and divided. It was sprayed with topical papaverine solution to prevent vasospasm.   Endoscopic vein harvest: performed by Laurel Becket, PA-C  The right greater saphenous vein was harvested endoscopically through a 2 cm incision medial to the right knee. It was harvested from the thigh. It was a medium-sized vein of good quality. The side branches were all ligated with 4-0 silk ties.    Coronary arteries:  The coronary arteries were examined.  LAD:  Large vessel with no distal disease. The Diagonal 3 was as large vessel with no distal disease. LCX:  branches are small. RCA:  no hemodynamically significant stenosis   Grafts: Assisted by Laurel Becket, PA-C  LIMA to the LAD: 2.5 mm. It was sewn end to side using 8-0 prolene continuous suture. SVG to D 3:  1.75 mm. It was sewn end to side using 7-0 prolene continuous suture.  The proximal vein graft anastomosis was performed to the mid-ascending aorta using continuous 6-0 prolene suture. A graft marker was placed around the proximal anastomosis.   Completion:  The patient was rewarmed to 37 degrees Centigrade. The clamp was removed from the LIMA pedicle and there was rapid warming of the septum and return of ventricular fibrillation. The crossclamp was removed with a time of 41 minutes. There was spontaneous return of sinus rhythm. The distal and proximal anastomoses were checked for hemostasis. The position of the grafts was satisfactory. Two temporary epicardial pacing wires were placed on the right atrium and two on the right ventricle. The patient was weaned from CPB without difficulty on no dopamine  2 mcg that was started prior to bypass for renal perfusion. CPB time was 54 minutes. Cardiac output was 8 LPM. TEE showed normal LV systolic function with an EF of 70%. Heparin  was fully reversed with protamine  and the aortic and venous cannulas removed. Hemostasis  was achieved. Mediastinal and left pleural drainage tubes were placed. The sternum was closed with  #6 stainless steel wires. The fascia was closed with continuous # 1 vicryl suture. The subcutaneous tissue was closed with 2-0 vicryl continuous suture. The skin was closed with 3-0 vicryl subcuticular suture. All sponge, needle, and instrument counts were reported correct at the end of the case. Dry sterile dressings were placed over the incisions and around the chest tubes which were connected to pleurevac suction. The patient was then transported to the surgical intensive care unit in stable condition.

## 2024-08-23 NOTE — Interval H&P Note (Signed)
 History and Physical Interval Note:  08/23/2024 6:39 AM  Adam Chung  has presented today for surgery, with the diagnosis of CAD.  The various methods of treatment have been discussed with the patient and family. After consideration of risks, benefits and other options for treatment, the patient has consented to  Procedure(s): CORONARY ARTERY BYPASS GRAFTING (CABG) (N/A) ECHOCARDIOGRAM, TRANSESOPHAGEAL, INTRAOPERATIVE (N/A) as a surgical intervention.  The patient's history has been reviewed, patient examined, no change in status, stable for surgery.  I have reviewed the patient's chart and labs.  Questions were answered to the patient's satisfaction.     Kaytie Ratcliffe K Alethea Terhaar

## 2024-08-23 NOTE — Anesthesia Procedure Notes (Signed)
 Central Venous Catheter Insertion Performed by: Leonce Athens, MD, anesthesiologist Start/End9/25/2025 6:40 AM, 08/23/2024 6:53 AM Preanesthetic checklist: patient identified, IV checked, risks and benefits discussed, surgical consent, monitors and equipment checked, pre-op evaluation, timeout performed and anesthesia consent Position: supine Lidocaine  1% used for infiltration and patient sedated Hand hygiene performed , maximum sterile barriers used  and Seldinger technique used Catheter size: 8.5 Fr PA cath was placed.Sheath introducer Swan type:thermodilution Procedure performed using ultrasound guided technique. Ultrasound Notes:anatomy identified, needle tip was noted to be adjacent to the nerve/plexus identified, no ultrasound evidence of intravascular and/or intraneural injection and image(s) printed for medical record Attempts: 1 Following insertion, line sutured, dressing applied and Biopatch. Post procedure assessment: free fluid flow, blood return through all ports and no air  Patient tolerated the procedure well with no immediate complications. Additional procedure comments: PA catheter:  Routine monitors. Timeout, sterile prep, drape, FBP R neck. Supine position.  1% Lido local, finder and trocar RIJ 1st pass with US  guidance.  Cordis placed over J wire. PA catheter in easily.  Sterile dressing applied.  Patient tolerated well, VSS.  JAYSON Leonce, MD.

## 2024-08-23 NOTE — Procedures (Signed)
 Extubation Procedure Note  Patient Details:   Name: Adam Chung DOB: 22-Jun-1951 MRN: [id number]   Airway Documentation:    Vent end date: 08/23/24 Vent end time: 1711   Evaluation  O2 sats: stable throughout Complications: No apparent complications Patient did tolerate procedure well. Bilateral Breath Sounds: Clear   Yes  Vannie Lava T 08/23/2024, 6:00 PM

## 2024-08-23 NOTE — Progress Notes (Addendum)
 PHARMACY - ANTICOAGULATION CONSULT NOTE  Pharmacy Consult for heparin  Indication: CAD  Labs: Recent Labs    08/21/24 2338 08/21/24 2339 08/21/24 2341 08/22/24 0241 08/22/24 0854 08/22/24 1427 08/23/24 0022  HGB 11.9*   < > 11.7*  --   --  13.2 13.6  HCT 35.0*   < > 34.0*  --   --  39.6 40.1  PLT  --   --  184  --   --  201 199  LABPROT  --   --  14.9  --   --   --   --   INR  --   --  1.1  --   --   --   --   HEPARINUNFRC  --   --   --   --   --  0.28* 0.37  CREATININE 2.20*  --  2.16*  --   --  2.34*  --   TROPONINIHS  --   --  166* 3,315* 3,912*  --   --    < > = values in this interval not displayed.   Assessment/Plan:  73yo male therapeutic on heparin  after rate change. Will continue infusion at current rate of 1300 units/hr until off for OR.  Marvetta Dauphin, PharmD, BCPS 08/23/2024 12:56 AM

## 2024-08-23 NOTE — Transfer of Care (Signed)
 Immediate Anesthesia Transfer of Care Note  Patient: Adam Chung  Procedure(s) Performed: CORONARY ARTERY BYPASS GRAFTING (CABG) TIMES TWO USING LEFT INTERNAL MAMMARY ARTERY AND ENDOSCOPICALLY HARVESTED RIGHT GREATER SAPHENOUS VEIN (Chest) ECHOCARDIOGRAM, TRANSESOPHAGEAL, INTRAOPERATIVE  Patient Location: ICU  Anesthesia Type:General  Level of Consciousness: sedated and Patient remains intubated per anesthesia plan  Airway & Oxygen Therapy: Patient remains intubated per anesthesia plan and Patient placed on Ventilator (see vital sign flow sheet for setting)  Post-op Assessment: Report given to RN and Post -op Vital signs reviewed and stable  Post vital signs: Reviewed and stable  Last Vitals:  Vitals Value Taken Time  BP 98/64 08/23/24 12:00  Temp 36.2 C 08/23/24 12:05  Pulse 81 08/23/24 12:04  Resp 16 08/23/24 12:05  SpO2 97 % 08/23/24 12:04  Vitals shown include unfiled device data.  Last Pain:  Vitals:   08/23/24 0400  TempSrc:   PainSc: 0-No pain         Complications: No notable events documented.  Patient transported to ICU with standard monitors (HR, BP, SPO2, RR) and emergency drugs/equipment. Controlled ventilation maintained via ambu bag. Report given to bedside RN and respiratory therapist. Pt connected to ICU monitor and ventilator. All questions answered and vital signs stable before leaving

## 2024-08-23 NOTE — Progress Notes (Signed)
 EVENING ROUNDS NOTE :     [address]       [place] [address]             [phone]                 * Day of Surgery * Procedure(s) (LRB): CORONARY ARTERY BYPASS GRAFTING (CABG) TIMES TWO USING LEFT INTERNAL MAMMARY ARTERY AND ENDOSCOPICALLY HARVESTED RIGHT GREATER SAPHENOUS VEIN (N/A) ECHOCARDIOGRAM, TRANSESOPHAGEAL, INTRAOPERATIVE (N/A)   Total Length of Stay:  LOS: 2 days  Events:   No events Extubated Good HD Low CT output    BP 118/76   Pulse 95   Temp 99.9 F (37.7 C)   Resp 11   Ht 5' 11 (1.803 m)   Wt 85.3 kg   SpO2 95%   BMI 26.23 kg/m   PAP: (17-32)/(-3-21) 19/8 CO:  [4.4 L/min-6.4 L/min] 6.4 L/min CI:  [2.2 L/min/m2-3.1 L/min/m2] 3.1 L/min/m2  Vent Mode: SIMV;PRVC;PSV FiO2 (%):  [50 %] 50 % Set Rate:  [16 bmp] 16 bmp Vt Set:  [600 mL] 600 mL PEEP:  [5 cmH20] 5 cmH20 Pressure Support:  [10 cmH20] 10 cmH20 Plateau Pressure:  [16 cmH20] 16 cmH20   sodium chloride  10 mL/hr at 08/23/24 1800   [START ON 08/24/2024] sodium chloride      sodium chloride  20 mL/hr at 08/23/24 1800   albumin  human Stopped (08/23/24 1459)    ceFAZolin  (ANCEF ) IV Stopped (08/23/24 1342)   dexmedetomidine  (PRECEDEX ) IV infusion Stopped (08/23/24 1608)   DOPamine  2 mcg/kg/min (08/23/24 1800)   insulin  1.1 Units/hr (08/23/24 1800)   lactated ringers  Stopped (08/23/24 1300)   lactated ringers  20 mL/hr at 08/23/24 1800   magnesium  sulfate     nitroGLYCERIN  65 mcg/min (08/23/24 1800)   phenylephrine  (NEO-SYNEPHRINE) Adult infusion Stopped (08/23/24 1234)   vancomycin       I/O last 3 completed shifts: In: 1013.1 [I.V.:662.9; NG/GT:250; IV Piggyback:100.1] Out: 4005 [Urine:4005]      Latest Ref Rng & Units 08/23/2024    6:16 PM 08/23/2024    6:10 PM 08/23/2024    5:04 PM  CBC  WBC 4.0 - 10.5 K/uL  11.7    Hemoglobin 13.0 - 17.0 g/dL 8.8  9.3  9.5   Hematocrit 39.0 - 52.0 % 26.0  27.4  28.0   Platelets 150 - 400 K/uL  152         Latest Ref Rng &  Units 08/23/2024    6:16 PM 08/23/2024    5:04 PM 08/23/2024   12:09 PM  BMP  Sodium 135 - 145 mmol/L 142  141  140   Potassium 3.5 - 5.1 mmol/L 4.6  4.7  4.4     ABG    Component Value Date/Time   PHART 7.340 (L) 08/23/2024 1816   PCO2ART 42.8 08/23/2024 1816   PO2ART 78 (L) 08/23/2024 1816   HCO3 22.9 08/23/2024 1816   TCO2 24 08/23/2024 1816   ACIDBASEDEF 3.0 (H) 08/23/2024 1816   O2SAT 94 08/23/2024 1816       Linnie Rayas, MD 08/23/2024 6:45 PM

## 2024-08-23 NOTE — Discharge Instructions (Signed)

## 2024-08-23 NOTE — Brief Op Note (Signed)
 08/23/2024  9:18 AM  PATIENT:  Adam Chung  73 y.o. male  PRE-OPERATIVE DIAGNOSIS:  CORONARY ARTERY DISEASE, ACUTE STEMI  POST-OPERATIVE DIAGNOSIS:  CORONARY ARTERY DISEASE, ACUTE STEMI  PROCEDURE:  CORONARY ARTERY BYPASS GRAFTING X 2  LIMA-LAD SVG-D1  Vein harvest time: Vein prep time:  ECHOCARDIOGRAM, TRANSESOPHAGEAL, INTRAOPERATIVE  SURGEON: Lucas Dorise POUR, MD - Primary  PHYSICIAN ASSISTANT: Hazleigh Mccleave  ASSISTANTS: Gretel Evalene BIRCH, RN, RN First Assistant         Rudy Zebedee BROCKS, RN, Scrub Person   ANESTHESIA:   general  EBL:  BLOOD ADMINISTERED: NONE  DRAINS: mediastinal and left pleural drains   LOCAL MEDICATIONS USED:  NONE  COUNTS:  {OR COUNTS CORRECT/INCORRECT:204690}   DICTATION: .Dragon Dictation  PLAN OF CARE: Admit to inpatient   PATIENT DISPOSITION:  ICU - intubated and hemodynamically stable.   Delay start of Pharmacological VTE agent (>24hrs) due to surgical blood loss or risk of bleeding: yes

## 2024-08-23 NOTE — Hospital Course (Addendum)
 Referring: Dr. Elmira, MD Primary Care: Cleotilde Oneil FALCON, MD Primary Cardiologist:None  History of Present Illness:     This is a 73 year old male with a past medical history of hypertension, diabetes mellitus, CKD (stage IIIb), very remote tobacco use (quit over 35 years ago), bladder cancer (s/p cystoprostatectomy with ileal conduit) who presented to [facility] ED via EMS with complaints of chest pain on 08/21/2024. 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    EKG showed ST elevation in leads V1-3 with reciprocal ST depression. Initial Troponin I (high sensitivity) was 166 and max thus far is 3,912. He ruled in for a STEMI.  Cardiac catheterization done 08/21/2024 showed mid LAD with an 85% stenosis, 3rd Diagonal with an 80% stenosis, mid to distal Circumflex with a 90%stenosis, and OM3 with a 100% stenosis. Echocardiogram has done earlier today showed LVEF 40-45%, regional wall motion abnormalities (akinesis of the distal septum, distal inferior wall and apex), trivial MR and TR, and cannot rule out apical thrombus.Cardiothoracic surgery has been consulted for consideration of coronary artery bypass grafting surgery. At the time of my exam, patient denied arm pain, chest pain or shortness of breath.    Patient lives alone. 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 He has a supportive daughter.  His daughter and son in law at bedside.  Mr. Disney was evaluated by Dr. Lucas who felt the patient was an appropriate candidate for coronary bypass grafting.  The procedure was explained to the patient and his family and he decided to proceed with surgery.  He remained stable following left heart catheterization.  Hospital Course: Mr. Dineen was taken to the operating room on 08/23/2024  where two-vessel coronary bypass grafting was accomplished.  The left internal mammary artery was grafted to the left anterior descending coronary artery and an endoscopically harvested saphenous vein graft was placed to the diagonal coronary artery. Following the procedure he separated from cardiopulmonary bypass without any difficulty and was transferred to the ICU in stable condition. He was extubated the evening of surgery without complication. He had stage 3b CKD with baseline Cr of 2, Cr remained stable at 2.19. Drips were weaned as hemodynamics tolerated. Swan ganz catheter, arterial line, and epicardial pacing wires were removed without complication. Insulin  drip was transitioned to Lantus  and SSI.  The patient's chest tube output decreased and his left sided chest tube was removed on 08/26/2024.  He developed a slight increase in his creatinine due to administration of Lasix , resulting in holding further administration. Creatinine trended back toward baseline.  He was able to wean from the supplemental O2 without difficulty.   He remained in NSR and was felt stable for transfer to the progressive care unit on 08/26/2024. He remained in a stable SR and progressed with mobility. Diet was advanced routinely and well tolerated.  Glucose was monitored and insulin  coverage adjusted accordingly.

## 2024-08-24 ENCOUNTER — Encounter (HOSPITAL_COMMUNITY): Payer: Self-pay | Admitting: Surgery

## 2024-08-24 ENCOUNTER — Inpatient Hospital Stay (HOSPITAL_COMMUNITY)

## 2024-08-24 LAB — GLUCOSE, CAPILLARY
Glucose-Capillary: 101 mg/dL — ABNORMAL HIGH (ref 70–99)
Glucose-Capillary: 110 mg/dL — ABNORMAL HIGH (ref 70–99)
Glucose-Capillary: 116 mg/dL — ABNORMAL HIGH (ref 70–99)
Glucose-Capillary: 125 mg/dL — ABNORMAL HIGH (ref 70–99)
Glucose-Capillary: 126 mg/dL — ABNORMAL HIGH (ref 70–99)
Glucose-Capillary: 134 mg/dL — ABNORMAL HIGH (ref 70–99)
Glucose-Capillary: 146 mg/dL — ABNORMAL HIGH (ref 70–99)
Glucose-Capillary: 153 mg/dL — ABNORMAL HIGH (ref 70–99)
Glucose-Capillary: 156 mg/dL — ABNORMAL HIGH (ref 70–99)
Glucose-Capillary: 199 mg/dL — ABNORMAL HIGH (ref 70–99)

## 2024-08-24 LAB — CBC
HCT: 26 % — ABNORMAL LOW (ref 39.0–52.0)
HCT: 29.8 % — ABNORMAL LOW (ref 39.0–52.0)
Hemoglobin: 8.7 g/dL — ABNORMAL LOW (ref 13.0–17.0)
Hemoglobin: 9.5 g/dL — ABNORMAL LOW (ref 13.0–17.0)
MCH: 30.3 pg (ref 26.0–34.0)
MCH: 30 pg (ref 26.0–34.0)
MCHC: 33.5 g/dL (ref 30.0–36.0)
MCHC: 31.9 g/dL (ref 30.0–36.0)
MCV: 90.6 fL (ref 80.0–100.0)
MCV: 94 fL (ref 80.0–100.0)
Platelets: 128 K/uL — ABNORMAL LOW (ref 150–400)
Platelets: 162 K/uL (ref 150–400)
RBC: 2.87 MIL/uL — ABNORMAL LOW (ref 4.22–5.81)
RBC: 3.17 MIL/uL — ABNORMAL LOW (ref 4.22–5.81)
RDW: 13.2 % (ref 11.5–15.5)
RDW: 13.3 % (ref 11.5–15.5)
WBC: 10.3 K/uL (ref 4.0–10.5)
WBC: 13.1 K/uL — ABNORMAL HIGH (ref 4.0–10.5)
nRBC: 0 % (ref 0.0–0.2)
nRBC: 0 % (ref 0.0–0.2)

## 2024-08-24 LAB — BASIC METABOLIC PANEL WITH GFR
Anion gap: 12 (ref 5–15)
Anion gap: 10 (ref 5–15)
BUN: 30 mg/dL — ABNORMAL HIGH (ref 8–23)
BUN: 36 mg/dL — ABNORMAL HIGH (ref 8–23)
CO2: 22 mmol/L (ref 22–32)
CO2: 22 mmol/L (ref 22–32)
Calcium: 7.5 mg/dL — ABNORMAL LOW (ref 8.9–10.3)
Calcium: 7.7 mg/dL — ABNORMAL LOW (ref 8.9–10.3)
Chloride: 104 mmol/L (ref 98–111)
Chloride: 105 mmol/L (ref 98–111)
Creatinine, Ser: 2.19 mg/dL — ABNORMAL HIGH (ref 0.61–1.24)
Creatinine, Ser: 2.65 mg/dL — ABNORMAL HIGH (ref 0.61–1.24)
GFR, Estimated: 31 mL/min — ABNORMAL LOW (ref >60)
GFR, Estimated: 25 mL/min — ABNORMAL LOW (ref >60)
Glucose, Bld: 118 mg/dL — ABNORMAL HIGH (ref 70–99)
Glucose, Bld: 144 mg/dL — ABNORMAL HIGH (ref 70–99)
Potassium: 4.4 mmol/L (ref 3.5–5.1)
Potassium: 4.7 mmol/L (ref 3.5–5.1)
Sodium: 138 mmol/L (ref 135–145)
Sodium: 137 mmol/L (ref 135–145)

## 2024-08-24 LAB — MAGNESIUM
Magnesium: 1.9 mg/dL (ref 1.7–2.4)
Magnesium: 2.1 mg/dL (ref 1.7–2.4)

## 2024-08-24 MED ORDER — ACETAMINOPHEN 160 MG/5ML PO SOLN
1000.0000 mg | Freq: Four times a day (QID) | ORAL | Status: DC
  Administered 2024-08-24 – 2024-08-28 (×14): 1000 mg via ORAL
  Filled 2024-08-24 (×14): qty 40.6

## 2024-08-24 MED ORDER — TRAMADOL HCL 50 MG PO TABS
50.0000 mg | ORAL_TABLET | Freq: Two times a day (BID) | ORAL | Status: DC | PRN
  Administered 2024-08-24 – 2024-08-25 (×3): 50 mg via ORAL
  Filled 2024-08-24 (×3): qty 1

## 2024-08-24 MED ORDER — ENOXAPARIN SODIUM 30 MG/0.3ML IJ SOSY
30.0000 mg | PREFILLED_SYRINGE | Freq: Every day | INTRAMUSCULAR | Status: DC
  Administered 2024-08-24 – 2024-08-27 (×4): 30 mg via SUBCUTANEOUS
  Filled 2024-08-24 (×4): qty 0.3

## 2024-08-24 MED ORDER — INSULIN ASPART 100 UNIT/ML IJ SOLN
0.0000 [IU] | INTRAMUSCULAR | Status: DC
  Administered 2024-08-24: 4 [IU] via SUBCUTANEOUS
  Administered 2024-08-24 (×2): 2 [IU] via SUBCUTANEOUS
  Administered 2024-08-25: 4 [IU] via SUBCUTANEOUS

## 2024-08-24 MED ORDER — ACETAMINOPHEN 500 MG PO TABS
1000.0000 mg | ORAL_TABLET | Freq: Four times a day (QID) | ORAL | Status: DC
  Administered 2024-08-25: 1000 mg via ORAL
  Filled 2024-08-24 (×5): qty 2

## 2024-08-24 MED ORDER — INSULIN GLARGINE 100 UNIT/ML ~~LOC~~ SOLN
8.0000 [IU] | Freq: Every day | SUBCUTANEOUS | Status: DC
  Administered 2024-08-24: 8 [IU] via SUBCUTANEOUS
  Filled 2024-08-24 (×2): qty 0.08

## 2024-08-24 MED FILL — Thrombin (Recombinant) For Soln 20000 Unit: CUTANEOUS | Qty: 1 | Status: AC

## 2024-08-24 NOTE — Progress Notes (Signed)
 TCTS PM Rounding Progress Note  Doing well this evening Wires out, tubes still in due to output Cre stabilized around baseline UOP ok  Vitals:   08/24/24 1715 08/24/24 1800  BP: 124/61 113/68  Pulse: 82 89  Resp: 13 18  Temp:    SpO2: 93% (!) 89%    Plan: - No changes overnight - Encouraged IS, ambulation as able  Con Clunes, MD Cardiothoracic Surgery Pager: [phone]    [address] [place] [address]             [phone]

## 2024-08-24 NOTE — Plan of Care (Signed)
  Problem: Nutrition: Goal: Adequate nutrition will be maintained Outcome: Progressing   Problem: Coping: Goal: Level of anxiety will decrease Outcome: Progressing   Problem: Pain Managment: Goal: General experience of comfort will improve and/or be controlled Outcome: Progressing   Problem: Safety: Goal: Ability to remain free from injury will improve Outcome: Progressing   Problem: Skin Integrity: Goal: Risk for impaired skin integrity will decrease Outcome: Progressing   Problem: Activity: Goal: Ability to return to baseline activity level will improve Outcome: Progressing   Problem: Cardiovascular: Goal: Ability to achieve and maintain adequate cardiovascular perfusion will improve Outcome: Progressing   Problem: Metabolic: Goal: Ability to maintain appropriate glucose levels will improve Outcome: Progressing

## 2024-08-24 NOTE — Progress Notes (Signed)
 1 Day Post-Op Procedure(s) (LRB): CORONARY ARTERY BYPASS GRAFTING (CABG) TIMES TWO USING LEFT INTERNAL MAMMARY ARTERY AND ENDOSCOPICALLY HARVESTED RIGHT GREATER SAPHENOUS VEIN (N/A) ECHOCARDIOGRAM, TRANSESOPHAGEAL, INTRAOPERATIVE (N/A) Subjective: Some pain but overall feels ok  Objective: Vital signs in last 24 hours: Temp:  [96.4 F (35.8 C)-100.2 F (37.9 C)] 99.1 F (37.3 C) (09/26 0700) Pulse Rate:  [75-104] 88 (09/26 0700) Cardiac Rhythm: Normal sinus rhythm (09/26 0000) Resp:  [6-34] 13 (09/26 0700) BP: (82-118)/(49-76) 112/60 (09/26 0700) SpO2:  [90 %-100 %] 95 % (09/26 0700) Arterial Line BP: (84-168)/(37-75) 114/40 (09/26 0700) FiO2 (%):  [50 %] 50 % (09/25 1202) Weight:  [88.7 kg] 88.7 kg (09/26 0500)  Hemodynamic parameters for last 24 hours: PAP: (11-32)/(3-21) 19/5 CO:  [4.4 L/min-7 L/min] 6.7 L/min CI:  [2.2 L/min/m2-3.41 L/min/m2] 3.28 L/min/m2  Intake/Output from previous day: 09/25 0701 - 09/26 0700 In: 5589.1 [I.V.:2386.9; IV Piggyback:3202.2] Out: 2150 [Urine:1620; Chest Tube:530] Intake/Output this shift: Total I/O In: 25.5 [I.V.:25.5] Out: -   General appearance: alert and cooperative Neurologic: intact Heart: regular rate and rhythm Lungs: clear to auscultation bilaterally Extremities: edema mild Wound: dressings dry  Lab Results: Recent Labs    08/23/24 1810 08/23/24 1816 08/24/24 0409  WBC 11.7*  --  10.3  HGB 9.3* 8.8* 8.7*  HCT 27.4* 26.0* 26.0*  PLT 152  --  128*   BMET:  Recent Labs    08/23/24 1810 08/23/24 1816 08/24/24 0409  NA 139 142 138  K 4.5 4.6 4.4  CL 106  --  104  CO2 24  --  22  GLUCOSE 119*  --  118*  BUN 31*  --  30*  CREATININE 2.13*  --  2.19*  CALCIUM  7.4*  --  7.5*    PT/INR:  Recent Labs    08/23/24 1208  LABPROT 16.6*  INR 1.3*   ABG    Component Value Date/Time   PHART 7.340 (L) 08/23/2024 1816   HCO3 22.9 08/23/2024 1816   TCO2 24 08/23/2024 1816   ACIDBASEDEF 3.0 (H) 08/23/2024 1816    O2SAT 94 08/23/2024 1816   CBG (last 3)  Recent Labs    08/24/24 0156 08/24/24 0406 08/24/24 0619  GLUCAP 110* 116* 146*   CXR: small right apical ptx.  ECG: NSR, anterior T-wave abnormality present preop.  Assessment/Plan: S/P Procedure(s) (LRB): CORONARY ARTERY BYPASS GRAFTING (CABG) TIMES TWO USING LEFT INTERNAL MAMMARY ARTERY AND ENDOSCOPICALLY HARVESTED RIGHT GREATER SAPHENOUS VEIN (N/A) ECHOCARDIOGRAM, TRANSESOPHAGEAL, INTRAOPERATIVE (N/A)  POD 1  Hemodynamically stable in sinus rhythm. Continue Lopressor  Preop EF 40-45% but EF 70% with no wall motion abnormality on TEE after revasc.  Stage 3b CKD with baseline creat of around 2. Stable postop. Will continue to follow. Wt is about 8 lbs over preop. Will hold off on diuresis today to be sure creat is stable.  DC pacing wires.  He has small right apical ptx on CXR this am. Will repeat this afternoon to be sure it is stable.  Keep chest tubes in today.  DC swan, arterial line.  Glucose under good control. Transition to Lantus  and SSI.  IS, OOB.    LOS: 3 days    Adam Chung 08/24/2024

## 2024-08-24 NOTE — Plan of Care (Signed)
  Problem: Education: Goal: Knowledge of General Education information will improve Description: Including pain rating scale, medication(s)/side effects and non-pharmacologic comfort measures Outcome: Progressing   Problem: Health Behavior/Discharge Planning: Goal: Ability to manage health-related needs will improve Outcome: Progressing   Problem: Clinical Measurements: Goal: Ability to maintain clinical measurements within normal limits will improve Outcome: Progressing Goal: Respiratory complications will improve Outcome: Progressing Goal: Cardiovascular complication will be avoided Outcome: Progressing   Problem: Activity: Goal: Risk for activity intolerance will decrease Outcome: Progressing   Problem: Coping: Goal: Level of anxiety will decrease Outcome: Progressing   Problem: Elimination: Goal: Will not experience complications related to urinary retention Outcome: Progressing   Problem: Pain Managment: Goal: General experience of comfort will improve and/or be controlled Outcome: Progressing   Problem: Safety: Goal: Ability to remain free from injury will improve Outcome: Progressing   Problem: Skin Integrity: Goal: Risk for impaired skin integrity will decrease Outcome: Progressing   Problem: Education: Goal: Understanding of CV disease, CV risk reduction, and recovery process will improve Outcome: Progressing   Problem: Activity: Goal: Ability to return to baseline activity level will improve Outcome: Progressing   Problem: Cardiovascular: Goal: Ability to achieve and maintain adequate cardiovascular perfusion will improve Outcome: Progressing   Problem: Health Behavior/Discharge Planning: Goal: Ability to safely manage health-related needs after discharge will improve Outcome: Progressing   Problem: Education: Goal: Ability to describe self-care measures that may prevent or decrease complications (Diabetes Survival Skills Education) will  improve Outcome: Progressing Goal: Individualized Educational Video(s) Outcome: Progressing   Problem: Coping: Goal: Ability to adjust to condition or change in health will improve Outcome: Progressing   Problem: Fluid Volume: Goal: Ability to maintain a balanced intake and output will improve Outcome: Progressing   Problem: Health Behavior/Discharge Planning: Goal: Ability to identify and utilize available resources and services will improve Outcome: Progressing Goal: Ability to manage health-related needs will improve Outcome: Progressing   Problem: Metabolic: Goal: Ability to maintain appropriate glucose levels will improve Outcome: Progressing   Problem: Education: Goal: Understanding of cardiac disease, CV risk reduction, and recovery process will improve Outcome: Progressing Goal: Individualized Educational Video(s) Outcome: Progressing   Problem: Activity: Goal: Ability to tolerate increased activity will improve Outcome: Progressing   Problem: Cardiac: Goal: Ability to achieve and maintain adequate cardiovascular perfusion will improve Outcome: Progressing   Problem: Health Behavior/Discharge Planning: Goal: Ability to safely manage health-related needs after discharge will improve Outcome: Progressing   Problem: Education: Goal: Knowledge of disease or condition will improve Outcome: Progressing Goal: Knowledge of the prescribed therapeutic regimen will improve Outcome: Progressing   Problem: Activity: Goal: Risk for activity intolerance will decrease Outcome: Progressing   Problem: Cardiac: Goal: Will achieve and/or maintain hemodynamic stability Outcome: Progressing   Problem: Clinical Measurements: Goal: Postoperative complications will be avoided or minimized Outcome: Progressing   Problem: Respiratory: Goal: Respiratory status will improve Outcome: Progressing

## 2024-08-25 ENCOUNTER — Inpatient Hospital Stay (HOSPITAL_COMMUNITY)

## 2024-08-25 LAB — CBC
HCT: 26.7 % — ABNORMAL LOW (ref 39.0–52.0)
Hemoglobin: 8.7 g/dL — ABNORMAL LOW (ref 13.0–17.0)
MCH: 30.6 pg (ref 26.0–34.0)
MCHC: 32.6 g/dL (ref 30.0–36.0)
MCV: 94 fL (ref 80.0–100.0)
Platelets: 114 K/uL — ABNORMAL LOW (ref 150–400)
RBC: 2.84 MIL/uL — ABNORMAL LOW (ref 4.22–5.81)
RDW: 13.3 % (ref 11.5–15.5)
WBC: 9 K/uL (ref 4.0–10.5)
nRBC: 0 % (ref 0.0–0.2)

## 2024-08-25 LAB — BASIC METABOLIC PANEL WITH GFR
Anion gap: 9 (ref 5–15)
BUN: 41 mg/dL — ABNORMAL HIGH (ref 8–23)
CO2: 21 mmol/L — ABNORMAL LOW (ref 22–32)
Calcium: 7.6 mg/dL — ABNORMAL LOW (ref 8.9–10.3)
Chloride: 104 mmol/L (ref 98–111)
Creatinine, Ser: 3.04 mg/dL — ABNORMAL HIGH (ref 0.61–1.24)
GFR, Estimated: 21 mL/min — ABNORMAL LOW (ref >60)
Glucose, Bld: 118 mg/dL — ABNORMAL HIGH (ref 70–99)
Potassium: 4.2 mmol/L (ref 3.5–5.1)
Sodium: 134 mmol/L — ABNORMAL LOW (ref 135–145)

## 2024-08-25 LAB — GLUCOSE, CAPILLARY
Glucose-Capillary: 115 mg/dL — ABNORMAL HIGH (ref 70–99)
Glucose-Capillary: 179 mg/dL — ABNORMAL HIGH (ref 70–99)
Glucose-Capillary: 173 mg/dL — ABNORMAL HIGH (ref 70–99)
Glucose-Capillary: 176 mg/dL — ABNORMAL HIGH (ref 70–99)
Glucose-Capillary: 141 mg/dL — ABNORMAL HIGH (ref 70–99)

## 2024-08-25 MED ORDER — INSULIN GLARGINE 100 UNIT/ML ~~LOC~~ SOLN
10.0000 [IU] | Freq: Every day | SUBCUTANEOUS | Status: DC
  Administered 2024-08-25 – 2024-08-26 (×2): 10 [IU] via SUBCUTANEOUS
  Filled 2024-08-25 (×3): qty 0.1

## 2024-08-25 MED ORDER — INSULIN ASPART 100 UNIT/ML IJ SOLN
0.0000 [IU] | Freq: Three times a day (TID) | INTRAMUSCULAR | Status: DC
  Administered 2024-08-25: 3 [IU] via SUBCUTANEOUS
  Administered 2024-08-25: 4 [IU] via SUBCUTANEOUS
  Administered 2024-08-26: 2 [IU] via SUBCUTANEOUS

## 2024-08-25 MED ORDER — FUROSEMIDE 10 MG/ML IJ SOLN
60.0000 mg | Freq: Once | INTRAMUSCULAR | Status: AC
  Administered 2024-08-25: 60 mg via INTRAVENOUS
  Filled 2024-08-25: qty 6

## 2024-08-25 MED ORDER — INSULIN ASPART 100 UNIT/ML IJ SOLN: 0.0000 [IU] | Freq: Every day | INTRAMUSCULAR | Status: DC

## 2024-08-25 NOTE — Plan of Care (Signed)

## 2024-08-25 NOTE — Progress Notes (Signed)
 TCTS PM Rounding Note:  Resting Comfortably Nausea/emesis improved.. some Ileostomy output today 560 cc U/OP in last 8 hours CT output 230 cc in last 8 hours  Vitals:   08/25/24 1745 08/25/24 1800  BP:  98/60  Pulse: 86 88  Resp: 10 13  Temp:    SpO2: 94% 94%   Arjun Hard, PA-C 6:14 PM 08/25/24

## 2024-08-25 NOTE — Progress Notes (Signed)
 2 Days Post-Op Procedure(s) (LRB): CORONARY ARTERY BYPASS GRAFTING (CABG) TIMES TWO USING LEFT INTERNAL MAMMARY ARTERY AND ENDOSCOPICALLY HARVESTED RIGHT GREATER SAPHENOUS VEIN (N/A) ECHOCARDIOGRAM, TRANSESOPHAGEAL, INTRAOPERATIVE (N/A) Subjective: Doing well this morning Increased oxygen requirement after walk, now on 5L [place] Having nausea, emesis x 1, no bowel function yet UOP avg 30 ml/hr with cre bump from 2.6 to 3 this morning Per RN, 140 ml output from chest tubes after walk  Objective: Vital signs in last 24 hours: Temp:  [97.4 F (36.3 C)-99 F (37.2 C)] 97.4 F (36.3 C) (09/27 0700) Pulse Rate:  [74-98] 95 (09/27 0700) Cardiac Rhythm: Normal sinus rhythm (09/26 2328) Resp:  [1-22] 16 (09/27 0700) BP: (81-159)/(52-68) 118/65 (09/27 0700) SpO2:  [89 %-98 %] 93 % (09/27 0700) Arterial Line BP: (125-141)/(44-48) 141/48 (09/26 1000) Weight:  [88.7 kg] 88.7 kg (09/27 0500)  Hemodynamic parameters for last 24 hours: PAP: (20-25)/(6-10) 25/10  Intake/Output from previous day: 09/26 0701 - 09/27 0700 In: 438.7 [I.V.:138.6; IV Piggyback:300.1] Out: 965 [Urine:785; Chest Tube:180] Intake/Output this shift: No intake/output data recorded.  Physical Exam: General - Resting comfortably in chair CV - RRR Resp - Unlabored on 5L [place] Abd - Soft, ND/NT Ext - Trace lower extremity and arm edema  Lab Results: Recent Labs    08/24/24 1700 08/25/24 0455  WBC 13.1* 9.0  HGB 9.5* 8.7*  HCT 29.8* 26.7*  PLT 162 114*   BMET:  Recent Labs    08/24/24 1700 08/25/24 0455  NA 137 134*  K 4.7 4.2  CL 105 104  CO2 22 21*  GLUCOSE 144* 118*  BUN 36* 41*  CREATININE 2.65* 3.04*  CALCIUM  7.7* 7.6*    PT/INR:  Recent Labs    08/23/24 1208  LABPROT 16.6*  INR 1.3*   ABG    Component Value Date/Time   PHART 7.340 (L) 08/23/2024 1816   HCO3 22.9 08/23/2024 1816   TCO2 24 08/23/2024 1816   ACIDBASEDEF 3.0 (H) 08/23/2024 1816   O2SAT 94 08/23/2024 1816   CBG (last 3)   Recent Labs    08/24/24 2307 08/25/24 0451 08/25/24 0742  GLUCAP 101* 115* 179*    Assessment/Plan: S/P Procedure(s) (LRB): CORONARY ARTERY BYPASS GRAFTING (CABG) TIMES TWO USING LEFT INTERNAL MAMMARY ARTERY AND ENDOSCOPICALLY HARVESTED RIGHT GREATER SAPHENOUS VEIN (N/A) ECHOCARDIOGRAM, TRANSESOPHAGEAL, INTRAOPERATIVE (N/A) POD 2 s/p CABG with ileal conduit, CKD at baseline NEURO- intact  Pain control PRN CV- in SR around 80-90 bpm             Continue metop 12.5 BID RESP- Continued improved lung aeration             Continue IS, pulm hygiene, ambulation  Keep Chest tubes for one more walk, if output low, will remove RENAL- creatinine up to 3.0, lytes Ok  Weight 195 from 187 pre-op             Start IV Lasix  60 x 1 today  Has ileal conduit GI- Having good amount of nausea with emesis x 1 yesterday             Continue diet, exam benign  BM: No BM yet, continue bowel regimen Endo- BG fairly well controlled Transition to qACHS ISS + lantus ; will increase lantus  from 8 to 10 units DVT ppx - SCD + Lovenox   Dispo: ICU    LOS: 4 days    Con RAMAN Tiffny Gemmer 08/25/2024

## 2024-08-25 NOTE — Plan of Care (Signed)
  Problem: Metabolic: Goal: Ability to maintain appropriate glucose levels will improve Outcome: Progressing   Problem: Nutritional: Goal: Maintenance of adequate nutrition will improve Outcome: Progressing   Problem: Cardiac: Goal: Ability to achieve and maintain adequate cardiovascular perfusion will improve Outcome: Progressing   Problem: Activity: Goal: Risk for activity intolerance will decrease Outcome: Progressing   Problem: Cardiac: Goal: Will achieve and/or maintain hemodynamic stability Outcome: Progressing   Problem: Clinical Measurements: Goal: Postoperative complications will be avoided or minimized Outcome: Progressing   Problem: Respiratory: Goal: Respiratory status will improve Outcome: Progressing

## 2024-08-26 ENCOUNTER — Inpatient Hospital Stay (HOSPITAL_COMMUNITY)

## 2024-08-26 LAB — CBC
HCT: 28 % — ABNORMAL LOW (ref 39.0–52.0)
Hemoglobin: 9.1 g/dL — ABNORMAL LOW (ref 13.0–17.0)
MCH: 30.6 pg (ref 26.0–34.0)
MCHC: 32.5 g/dL (ref 30.0–36.0)
MCV: 94.3 fL (ref 80.0–100.0)
Platelets: 133 K/uL — ABNORMAL LOW (ref 150–400)
RBC: 2.97 MIL/uL — ABNORMAL LOW (ref 4.22–5.81)
RDW: 13.4 % (ref 11.5–15.5)
WBC: 9.2 K/uL (ref 4.0–10.5)
nRBC: 0 % (ref 0.0–0.2)

## 2024-08-26 LAB — GLUCOSE, CAPILLARY
Glucose-Capillary: 139 mg/dL — ABNORMAL HIGH (ref 70–99)
Glucose-Capillary: 160 mg/dL — ABNORMAL HIGH (ref 70–99)
Glucose-Capillary: 162 mg/dL — ABNORMAL HIGH (ref 70–99)
Glucose-Capillary: 141 mg/dL — ABNORMAL HIGH (ref 70–99)

## 2024-08-26 LAB — BASIC METABOLIC PANEL WITH GFR
Anion gap: 11 (ref 5–15)
BUN: 55 mg/dL — ABNORMAL HIGH (ref 8–23)
CO2: 21 mmol/L — ABNORMAL LOW (ref 22–32)
Calcium: 8.1 mg/dL — ABNORMAL LOW (ref 8.9–10.3)
Chloride: 104 mmol/L (ref 98–111)
Creatinine, Ser: 3.32 mg/dL — ABNORMAL HIGH (ref 0.61–1.24)
GFR, Estimated: 19 mL/min — ABNORMAL LOW (ref >60)
Glucose, Bld: 210 mg/dL — ABNORMAL HIGH (ref 70–99)
Potassium: 4.4 mmol/L (ref 3.5–5.1)
Sodium: 136 mmol/L (ref 135–145)

## 2024-08-26 MED ORDER — SODIUM CHLORIDE 0.9% FLUSH: 3.0000 mL | INTRAVENOUS | Status: DC | PRN

## 2024-08-26 MED ORDER — SODIUM CHLORIDE 0.9% FLUSH
3.0000 mL | Freq: Two times a day (BID) | INTRAVENOUS | Status: DC
  Administered 2024-08-26 – 2024-08-28 (×4): 3 mL via INTRAVENOUS

## 2024-08-26 MED ORDER — INSULIN ASPART 100 UNIT/ML IJ SOLN
0.0000 [IU] | Freq: Three times a day (TID) | INTRAMUSCULAR | Status: DC
  Administered 2024-08-26 – 2024-08-27 (×4): 2 [IU] via SUBCUTANEOUS
  Administered 2024-08-27: 3 [IU] via SUBCUTANEOUS
  Administered 2024-08-28: 2 [IU] via SUBCUTANEOUS

## 2024-08-26 MED ORDER — ~~LOC~~ CARDIAC SURGERY, PATIENT & FAMILY EDUCATION: Freq: Once | Status: AC

## 2024-08-26 MED ORDER — SODIUM CHLORIDE 0.9 % IV SOLN: 250.0000 mL | INTRAVENOUS | Status: AC | PRN

## 2024-08-26 MED ORDER — LACTULOSE 10 GM/15ML PO SOLN
10.0000 g | Freq: Once | ORAL | Status: DC
Start: 2024-08-26 — End: 2024-08-27
  Filled 2024-08-26: qty 15

## 2024-08-26 NOTE — Progress Notes (Signed)
 3 Days Post-Op Procedure(s) (LRB): CORONARY ARTERY BYPASS GRAFTING (CABG) TIMES TWO USING LEFT INTERNAL MAMMARY ARTERY AND ENDOSCOPICALLY HARVESTED RIGHT GREATER SAPHENOUS VEIN (N/A) ECHOCARDIOGRAM, TRANSESOPHAGEAL, INTRAOPERATIVE (N/A) Subjective: Feeling well this morning Ambulating frequently Still on 4L [place] Creatinine bump this morning  Objective: Vital signs in last 24 hours: BP (!) 98/54   Pulse 80   Temp (!) 97.4 F (36.3 C) (Axillary)   Resp 12   Ht 5' 11 (1.803 m)   Wt 86.5 kg   SpO2 96%   BMI 26.60 kg/m  Filed Weights   08/24/24 0500 08/25/24 0500 08/26/24 0500  Weight: 88.7 kg 88.7 kg 86.5 kg    Hemodynamic parameters for last 24 hours:    Intake/Output from previous day: 09/27 0701 - 09/28 0700 In: 553 [P.O.:550; I.V.:3] Out: 1450 [Urine:1100; Chest Tube:350] Intake/Output this shift: No intake/output data recorded.  Physical Exam: General - Resting comfortably in chair CV - RRR Resp - Unlabored on 4L [place], tube output serosanguinous Abd - Soft, ND/NT Ext - Mild bilateral lower extremity edema  Lab Results:    Latest Ref Rng & Units 08/26/2024    8:43 AM 08/25/2024    4:55 AM 08/24/2024    5:00 PM  CBC  WBC 4.0 - 10.5 K/uL 9.2  9.0  13.1   Hemoglobin 13.0 - 17.0 g/dL 9.1  8.7  9.5   Hematocrit 39.0 - 52.0 % 28.0  26.7  29.8   Platelets 150 - 400 K/uL 133  114  162       Latest Ref Rng & Units 08/26/2024    8:43 AM 08/25/2024    4:55 AM 08/24/2024    5:00 PM  CMP  Glucose 70 - 99 mg/dL 789  881  855   BUN 8 - 23 mg/dL 55  41  36   Creatinine 0.61 - 1.24 mg/dL 6.67  6.95  7.34   Sodium 135 - 145 mmol/L 136  134  137   Potassium 3.5 - 5.1 mmol/L 4.4  4.2  4.7   Chloride 98 - 111 mmol/L 104  104  105   CO2 22 - 32 mmol/L 21  21  22    Calcium  8.9 - 10.3 mg/dL 8.1  7.6  7.7     CXR: Pending  Assessment/Plan: S/P Procedure(s) (LRB): CORONARY ARTERY BYPASS GRAFTING (CABG) TIMES TWO USING LEFT INTERNAL MAMMARY ARTERY AND ENDOSCOPICALLY HARVESTED  RIGHT GREATER SAPHENOUS VEIN (N/A) ECHOCARDIOGRAM, TRANSESOPHAGEAL, INTRAOPERATIVE (N/A) POD 3 s/p CABG NEURO- intact  Pain control PRN CV- in SR around 70-80 bpm             Continue metoprolol  RESP- F/u CXR today; tube output decreased             Continue IS, pulm hygiene, ambulation  Remove left Chest tube RENAL- s/p ileal conduit - creatinine bumped, lytes Ok  Weight up 3 lbs from preop             Lasix  x 1 yesterday, but will hold for today GI- tolerating Regular  BM: none yet - lactulose today Endo- BG fairly controlled On lantus  10 + ISS DVT ppx - SCD + lovenox  30 qHS  Dispo: Transfer to 4E today   LOS: 5 days    Con RAMAN Theus Espin 08/26/2024

## 2024-08-26 NOTE — Plan of Care (Signed)
°  Problem: Education: °Goal: Knowledge of General Education information will improve °Description: Including pain rating scale, medication(s)/side effects and non-pharmacologic comfort measures °Outcome: Progressing °  °Problem: Clinical Measurements: °Goal: Respiratory complications will improve °Outcome: Progressing °  °Problem: Activity: °Goal: Risk for activity intolerance will decrease °Outcome: Progressing °  °Problem: Elimination: °Goal: Will not experience complications related to bowel motility °Outcome: Progressing °  °

## 2024-08-27 ENCOUNTER — Inpatient Hospital Stay (HOSPITAL_COMMUNITY)

## 2024-08-27 LAB — GLUCOSE, CAPILLARY
Glucose-Capillary: 199 mg/dL — ABNORMAL HIGH (ref 70–99)
Glucose-Capillary: 185 mg/dL — ABNORMAL HIGH (ref 70–99)
Glucose-Capillary: 157 mg/dL — ABNORMAL HIGH (ref 70–99)
Glucose-Capillary: 202 mg/dL — ABNORMAL HIGH (ref 70–99)
Glucose-Capillary: 168 mg/dL — ABNORMAL HIGH (ref 70–99)

## 2024-08-27 LAB — BASIC METABOLIC PANEL WITH GFR
Anion gap: 8 (ref 5–15)
BUN: 55 mg/dL — ABNORMAL HIGH (ref 8–23)
CO2: 19 mmol/L — ABNORMAL LOW (ref 22–32)
Calcium: 8.2 mg/dL — ABNORMAL LOW (ref 8.9–10.3)
Chloride: 110 mmol/L (ref 98–111)
Creatinine, Ser: 2.71 mg/dL — ABNORMAL HIGH (ref 0.61–1.24)
GFR, Estimated: 24 mL/min — ABNORMAL LOW (ref >60)
Glucose, Bld: 208 mg/dL — ABNORMAL HIGH (ref 70–99)
Potassium: 4.2 mmol/L (ref 3.5–5.1)
Sodium: 137 mmol/L (ref 135–145)

## 2024-08-27 LAB — CBC
HCT: 28.1 % — ABNORMAL LOW (ref 39.0–52.0)
Hemoglobin: 9.2 g/dL — ABNORMAL LOW (ref 13.0–17.0)
MCH: 30.5 pg (ref 26.0–34.0)
MCHC: 32.7 g/dL (ref 30.0–36.0)
MCV: 93 fL (ref 80.0–100.0)
Platelets: 151 K/uL (ref 150–400)
RBC: 3.02 MIL/uL — ABNORMAL LOW (ref 4.22–5.81)
RDW: 13.2 % (ref 11.5–15.5)
WBC: 8.8 K/uL (ref 4.0–10.5)
nRBC: 0 % (ref 0.0–0.2)

## 2024-08-27 MED ORDER — METOPROLOL TARTRATE 25 MG PO TABS
25.0000 mg | ORAL_TABLET | Freq: Two times a day (BID) | ORAL | Status: DC
  Administered 2024-08-27 – 2024-08-28 (×3): 25 mg via ORAL
  Filled 2024-08-27 (×3): qty 1

## 2024-08-27 MED ORDER — INSULIN GLARGINE 100 UNIT/ML ~~LOC~~ SOLN
20.0000 [IU] | Freq: Every day | SUBCUTANEOUS | Status: DC
  Administered 2024-08-27 – 2024-08-28 (×2): 20 [IU] via SUBCUTANEOUS
  Filled 2024-08-27 (×2): qty 0.2

## 2024-08-27 MED FILL — Potassium Chloride Inj 2 mEq/ML: INTRAVENOUS | Qty: 40 | Status: AC

## 2024-08-27 MED FILL — Mannitol IV Soln 20%: INTRAVENOUS | Qty: 500 | Status: AC

## 2024-08-27 MED FILL — Magnesium Sulfate Inj 50%: INTRAMUSCULAR | Qty: 10 | Status: AC

## 2024-08-27 MED FILL — Sodium Bicarbonate IV Soln 8.4%: INTRAVENOUS | Qty: 50 | Status: AC

## 2024-08-27 MED FILL — Lidocaine HCl Local Soln Prefilled Syringe 100 MG/5ML (2%): INTRAMUSCULAR | Qty: 5 | Status: AC

## 2024-08-27 MED FILL — Heparin Sodium (Porcine) Inj 1000 Unit/ML: INTRAMUSCULAR | Qty: 30 | Status: AC

## 2024-08-27 MED FILL — Heparin Sodium (Porcine) Inj 1000 Unit/ML: INTRAMUSCULAR | Qty: 20 | Status: AC

## 2024-08-27 MED FILL — Electrolyte-R (PH 7.4) Solution: INTRAVENOUS | Qty: 3000 | Status: AC

## 2024-08-27 MED FILL — Sodium Chloride IV Soln 0.9%: INTRAVENOUS | Qty: 2000 | Status: AC

## 2024-08-27 MED FILL — Heparin Sodium (Porcine) Inj 1000 Unit/ML: Qty: 1000 | Status: AC

## 2024-08-27 NOTE — Plan of Care (Signed)

## 2024-08-27 NOTE — Plan of Care (Signed)

## 2024-08-27 NOTE — Progress Notes (Signed)
 4 Days Post-Op Procedure(s) (LRB): CORONARY ARTERY BYPASS GRAFTING (CABG) TIMES TWO USING LEFT INTERNAL MAMMARY ARTERY AND ENDOSCOPICALLY HARVESTED RIGHT GREATER SAPHENOUS VEIN (N/A) ECHOCARDIOGRAM, TRANSESOPHAGEAL, INTRAOPERATIVE (N/A) Subjective:  No complaints. Bowel movement this am. Ambulating well.  Objective: Vital signs in last 24 hours: Temp:  [97.8 F (36.6 C)-98.8 F (37.1 C)] 98.4 F (36.9 C) (09/29 0610) Pulse Rate:  [86-95] 86 (09/29 0610) Cardiac Rhythm: Normal sinus rhythm (09/28 1900) Resp:  [11-27] 20 (09/29 0610) BP: (111-136)/(52-66) 135/59 (09/29 0610) SpO2:  [90 %-98 %] 93 % (09/29 0610) Weight:  [86.3 kg] 86.3 kg (09/29 0610)  Hemodynamic parameters for last 24 hours:    Intake/Output from previous day: 09/28 0701 - 09/29 0700 In: 650 [P.O.:650] Out: 1370 [Urine:1350; Chest Tube:20] Intake/Output this shift: No intake/output data recorded.  General appearance: alert and cooperative Neurologic: intact Heart: regular rate and rhythm Lungs: clear to auscultation bilaterally Extremities: no edema Wound: incision healing well.  Lab Results: Recent Labs    08/26/24 0843 08/27/24 0411  WBC 9.2 8.8  HGB 9.1* 9.2*  HCT 28.0* 28.1*  PLT 133* 151   BMET:  Recent Labs    08/26/24 0843 08/27/24 0411  NA 136 137  K 4.4 4.2  CL 104 110  CO2 21* 19*  GLUCOSE 210* 208*  BUN 55* 55*  CREATININE 3.32* 2.71*  CALCIUM  8.1* 8.2*    PT/INR: No results for input(s): LABPROT, INR in the last 72 hours. ABG    Component Value Date/Time   PHART 7.340 (L) 08/23/2024 1816   HCO3 22.9 08/23/2024 1816   TCO2 24 08/23/2024 1816   ACIDBASEDEF 3.0 (H) 08/23/2024 1816   O2SAT 94 08/23/2024 1816   CBG (last 3)  Recent Labs    08/26/24 1744 08/26/24 2157 08/27/24 0632  GLUCAP 162* 141* 199*    Assessment/Plan: S/P Procedure(s) (LRB): CORONARY ARTERY BYPASS GRAFTING (CABG) TIMES TWO USING LEFT INTERNAL MAMMARY ARTERY AND ENDOSCOPICALLY HARVESTED  RIGHT GREATER SAPHENOUS VEIN (N/A) ECHOCARDIOGRAM, TRANSESOPHAGEAL, INTRAOPERATIVE (N/A)  POD 4  Hemodynamically stable in sinus rhythm. Will increase Lopressor  to 25 bid.  Wt is decreasing and now 3 lbs over baseline. He is spontaneously diuresing.  Creat is coming back down. Baseline is 2.2.  Glucose still in 200's at times. Will increase Lantus  to 20 daily and plan to send home on previous insulin  regimen since DM was well-controlled preop with Hgb A1c 5.9.  Continue IS, ambulation.  Plan home tomorrow if no changes.    LOS: 6 days    Dorise MARLA Fellers 08/27/2024

## 2024-08-27 NOTE — Plan of Care (Signed)
   Problem: Education: Goal: Knowledge of General Education information will improve Description Including pain rating scale, medication(s)/side effects and non-pharmacologic comfort measures Outcome: Progressing   Problem: Clinical Measurements: Goal: Ability to maintain clinical measurements within normal limits will improve Outcome: Progressing   Problem: Activity: Goal: Risk for activity intolerance will decrease Outcome: Progressing

## 2024-08-27 NOTE — Progress Notes (Signed)
 CARDIAC REHAB PHASE I   PRE:  Rate/Rhythm: 83 SR    BP: sitting 141/64    SpO2: 94 RA  MODE:  Ambulation: 470 ft   POST:  Rate/Rhythm: 102 ST    BP: sitting      SpO2: 95 RA   Pt able to move to EOB with verbal cues of sternal precautions. Ambulated in hall with RW, standby assist. No c/o. To recliner after walk. Sts he has RW at home. He can walk more on his own.  Discussed with pt and brother IS, sternal precautions, exercise, diet, and CRPII. Pt receptive. Will refer to [facility] CRPII.  [phone]  Aliene Aris BS, ACSM-CEP 08/27/2024 12:25 PM

## 2024-08-28 DIAGNOSIS — Z951 Presence of aortocoronary bypass graft: Secondary | ICD-10-CM

## 2024-08-28 LAB — HELIX PHARMACOGENOMICS (PGX) CLOPIDOGREL TEST

## 2024-08-28 LAB — GLUCOSE, CAPILLARY: Glucose-Capillary: 176 mg/dL — ABNORMAL HIGH (ref 70–99)

## 2024-08-28 MED ORDER — ASPIRIN 81 MG PO TBEC: 81.0000 mg | DELAYED_RELEASE_TABLET | Freq: Every day | ORAL | 3 refills | Status: DC

## 2024-08-28 MED ORDER — CLOPIDOGREL BISULFATE 75 MG PO TABS: 75.0000 mg | ORAL_TABLET | Freq: Every day | ORAL | 1 refills | Status: DC

## 2024-08-28 MED ORDER — ASPIRIN 81 MG PO TBEC: 81.0000 mg | DELAYED_RELEASE_TABLET | Freq: Every day | ORAL | Status: AC

## 2024-08-28 MED ORDER — METOPROLOL TARTRATE 25 MG PO TABS: 25.0000 mg | ORAL_TABLET | Freq: Two times a day (BID) | ORAL | 2 refills | Status: AC

## 2024-08-28 MED ORDER — ASPIRIN 325 MG PO TBEC: 325.0000 mg | DELAYED_RELEASE_TABLET | Freq: Every day | ORAL | Status: DC

## 2024-08-28 MED ORDER — ATORVASTATIN CALCIUM 80 MG PO TABS: 80.0000 mg | ORAL_TABLET | Freq: Every day | ORAL | 2 refills | Status: AC

## 2024-08-28 MED ORDER — ASPIRIN 81 MG PO CHEW: 81.0000 mg | CHEWABLE_TABLET | Freq: Every day | ORAL | Status: DC

## 2024-08-28 MED ORDER — ASPIRIN 81 MG PO TBEC
81.0000 mg | DELAYED_RELEASE_TABLET | Freq: Every day | ORAL | Status: DC
  Administered 2024-08-28: 81 mg via ORAL
  Filled 2024-08-28: qty 1

## 2024-08-28 MED ORDER — ASPIRIN EC 81 MG PO TBEC: 81.0000 mg | DELAYED_RELEASE_TABLET | Freq: Every day | ORAL | Status: DC

## 2024-08-28 MED ORDER — ACETAMINOPHEN 325 MG PO TABS: 650.0000 mg | ORAL_TABLET | Freq: Four times a day (QID) | ORAL | Status: DC | PRN

## 2024-08-28 MED ORDER — TRAMADOL HCL 50 MG PO TABS: 50.0000 mg | ORAL_TABLET | Freq: Two times a day (BID) | ORAL | 0 refills | Status: AC | PRN

## 2024-08-28 MED ORDER — CLOPIDOGREL BISULFATE 75 MG PO TABS
75.0000 mg | ORAL_TABLET | Freq: Every day | ORAL | Status: DC
  Administered 2024-08-28: 75 mg via ORAL
  Filled 2024-08-28: qty 1

## 2024-08-28 NOTE — TOC Transition Note (Signed)
 Transition of Care (TOC) - Discharge Note Rayfield Gobble RN, BSN Inpatient Care Management Unit 4E- RN Case Manager See Treatment Team for direct phone #   Patient Details  Name: Adam Chung MRN: [id number] Date of Birth: 09-23-1951  Transition [facility] CM/SW Contact:  Gobble Rayfield Hurst, RN Phone Number: 08/28/2024, 10:12 AM   Clinical Narrative:    Pt stable for transition home today, notified by [facility] liaison that TCTS office made referral for [facility] needs. Per bedside RN family requesting HH follow up as well as aide if available.   [facility] liaison updated of pt's discharge today and will follow up for [facility] needs.   No DME needs noted. Brother transported home.    Final next level of care: Home w Home Health Services Barriers to Discharge: No Barriers Identified   Patient Goals and CMS Choice Patient states their goals for this hospitalization and ongoing recovery are:: return home   Choice offered to / list presented to : Patient, Adult Children      Discharge Placement               Home w/ [facility]        Discharge Plan and Services Additional resources added to the After Visit Summary for     Discharge Planning Services: CM Consult Post Acute Care Choice: Home Health, Durable Medical Equipment          DME Arranged: N/A DME Agency: NA       HH Arranged: RN, PT, Nurse's Aide HH Agency: [facility] Date HH Agency Contacted: 08/27/24 Time HH Agency Contacted: 1000 Representative spoke with at [facility] Agency: [place]  Social Drivers of Health (SDOH) Interventions SDOH Screenings   Food Insecurity: No Food Insecurity (08/22/2024)  Housing: Unknown (08/22/2024)  Transportation Needs: No Transportation Needs (08/22/2024)  Utilities: Not At Risk (08/22/2024)  Financial Resource Strain: Low Risk  (12/30/2023)   Received from [facility]  Social Connections: Unknown (08/22/2024)  Tobacco Use: Medium Risk (08/23/2024)      Readmission Risk Interventions    08/28/2024   10:12 AM 09/08/2022    3:18 PM  Readmission Risk Prevention Plan  Transportation Screening Complete Complete  PCP or Specialist Appt within 5-7 Days Complete   PCP or Specialist Appt within 3-5 Days  Complete  Home Care Screening Complete   Medication Review (RN CM) Complete   HRI or Home Care Consult  Complete  Social Work Consult for Recovery Care Planning/Counseling  Complete  Palliative Care Screening  Not Applicable  Medication Review [employer])  Complete

## 2024-08-28 NOTE — Progress Notes (Addendum)
                  [address]           [place], [place] [address]                     [phone]        5 Days Post-Op Procedure(s) (LRB): CORONARY ARTERY BYPASS GRAFTING (CABG) TIMES TWO USING LEFT INTERNAL MAMMARY ARTERY AND ENDOSCOPICALLY HARVESTED RIGHT GREATER SAPHENOUS VEIN (N/A) ECHOCARDIOGRAM, TRANSESOPHAGEAL, INTRAOPERATIVE (N/A)  Subjective: Patient ambulating well. He is without complaints this am. He wants to go home.  Objective: Vital signs in last 24 hours: Temp:  [97.7 F (36.5 C)-98.9 F (37.2 C)] 98.4 F (36.9 C) (09/30 0331) Pulse Rate:  [44-93] 44 (09/30 0532) Cardiac Rhythm: Normal sinus rhythm (09/29 2029) Resp:  [15-22] 22 (09/30 0532) BP: (122-150)/(60-71) 142/71 (09/30 0331) SpO2:  [91 %-97 %] 97 % (09/30 0532) Weight:  [84.7 kg] 84.7 kg (09/30 0532)  Pre op weight 84.8 kg Current Weight  08/28/24 84.7 kg     Intake/Output from previous day: 09/29 0701 - 09/30 0700 In: 360 [P.O.:360] Out: 1200 [Urine:1200]   Physical Exam:  Cardiovascular: RRR Pulmonary: Clear to auscultation bilaterally Abdomen: Soft, non tender, bowel sounds present. Extremities: No lower extremity edema. Wounds: Sternal and RLE wounds are clean and dry.  No erythema or signs of infection.  Lab Results: CBC: Recent Labs    08/26/24 0843 08/27/24 0411  WBC 9.2 8.8  HGB 9.1* 9.2*  HCT 28.0* 28.1*  PLT 133* 151   BMET:  Recent Labs    08/26/24 0843 08/27/24 0411  NA 136 137  K 4.4 4.2  CL 104 110  CO2 21* 19*  GLUCOSE 210* 208*  BUN 55* 55*  CREATININE 3.32* 2.71*  CALCIUM  8.1* 8.2*    PT/INR:  Lab Results  Component Value Date   INR 1.3 (H) 08/23/2024   INR 1.1 08/21/2024   INR 1.2 10/09/2021   ABG:  INR: Will add last result for INR, ABG once components are confirmed Will add last 4 CBG results once components are confirmed  Assessment/Plan:  1. CV - S/p STEMI. SR. On Lopressor  25 mg bid. As discussed with Dr. Lucas,  will decrease ec asa and start Plavix . 2.  Pulmonary - On room air. Encourage incentive spirometer. 3. Above pre op weight, requires further diuresis 4.  Expected post op acute blood loss anemia - Last H and H stable at 9.2 and 28.1 5. On Lovenox  for DVT prophylaxis 6. CBGs 202/168/176.  On Insulin . Pre op HGA1C 5.9. 7. CKD (stage IIIb)-creatinine yesterday decreased to 2.71 8. Discharge;chest tube sutures will be removed in the office after discharge  Yuko Coventry M ZimmermanPA-C 6:57 AM

## 2024-08-28 NOTE — Progress Notes (Signed)
 DISCHARGE NOTE HOME SERJIO DEUPREE to be discharged Home per MD order. Discussed prescriptions and follow up appointments with the patient. Prescriptions given to patient; medication list explained in detail. Patient verbalized understanding.  Skin clean, dry and intact without evidence of skin break down, no evidence of skin tears noted. IV catheter discontinued intact. Site without signs and symptoms of complications. Dressing and pressure applied. Pt denies pain at the site currently. No complaints noted.  SeeLDa for incisions at discharge. Patient free of lines, drains, and wounds.   An After Visit Summary (AVS) was printed and given to the patient. Patient escorted via wheelchair, and discharged home via private auto.  Peyton SHAUNNA Pepper, RN

## 2024-08-29 DIAGNOSIS — Z48812 Encounter for surgical aftercare following surgery on the circulatory system: Secondary | ICD-10-CM | POA: Diagnosis not present

## 2024-08-30 ENCOUNTER — Telehealth (HOSPITAL_COMMUNITY): Payer: Self-pay

## 2024-08-30 NOTE — Telephone Encounter (Signed)
 Phase II referral. Went over cardiac rehab program, patient states he may be interested.  F/u 10/08 and 10/15.

## 2024-09-03 NOTE — Progress Notes (Signed)
 Received 4 boxes of [place]. Patient informed.

## 2024-09-03 NOTE — Progress Notes (Signed)
 Cardiology Office Note   Date:  09/12/2024  ID:  Theador, Jezewski 01-02-1951, MRN [id number] PCP: Cleotilde Oneil FALCON, MD  [facility] Providers Cardiologist:  Newman JINNY Lawrence, MD   History of Present Illness Adam Chung is a 73 y.o. male with past medical history of recent STEMI and CABG, bladder cancer and subsequent infections requiring cystoprostatectomy w/ileal conduit and urostomy bag (2023), CKD stage IIIb, insulin -dependent type 2 diabetes.  Patient presents today for a post STEMI/CABG follow-up appointment.  Patient presented on 08/21/2024 complaining of pressure-like, nonradiating, nonexertional chest pain with associated left arm numbness.  He called EMS.  Upon their arrival first EKG showed ST elevation in V1-V3 with reciprocal ST depression.  Given 324 mg aspirin  and his chest pain resolved.  Repeat EKG showed improvement in ST elevation.  He underwent cardiac catheterization 08/21/2024 and was found to have two-vessel obstructive CAD with a complex bifurcation stenosis involving the LAD and third diagonal branch.  Patient was chest pain-free and hemodynamically stable, EKG changes had resolved.  Options for management included complex LAD/diagonal bifurcation stenting versus CABG.  He underwent echocardiogram 08/18/2024 that showed EF 40-45% with akinesis of the distal septum, distal inferior wall and apex with overall mild LV dysfunction.  Could not rule out apical thrombus.  RV systolic function normal, RV size normal.  No significant valvular abnormalities.  CT surgery was consulted.  On 9/25 patient was taken to the OR and underwent two-vessel CABG with LIMA-LAD and SVG-diagonal.  Postsurgery, creatinine remained stable at 2.19.  Chest tube was removed 08/26/2024.  He remained in normal sinus rhythm.  He was started on Plavix  and aspirin  decreased to 81 mg daily.  Today, patient presents for follow-up appointment post CABG.  Reports that he has been doing very well  since his surgery.  He has some soreness near his sternal incision but has otherwise not had any chest pain.  He denies shortness of breath, palpitations, dizziness, syncope, near syncope.  Immediately after his surgery he noted some mild lower extremity swelling.  Resolved if he elevated his feet. He has not had swelling recently.  He has been trying to increase his physical activity by walking.  He is also able to cook and take care of his house.  Estimates being able to walk 300 feet or so. He is compliant with his medications and denies side effects or questions. No bruising or bleeding on DAPT    Studies Reviewed Cardiac Studies & Procedures   ______________________________________________________________________________________________ CARDIAC CATHETERIZATION  CARDIAC CATHETERIZATION 08/21/2024  Conclusion   Prox RCA-1 lesion is 45% stenosed.   Prox RCA-2 lesion is 20% stenosed.   Mid Cx to Dist Cx lesion is 90% stenosed.   3rd Mrg lesion is 100% stenosed.   Mid LAD lesion is 85% stenosed.   3rd Diag lesion is 80% stenosed.   LV end diastolic pressure is moderately elevated.  2 vessel obstructive CAD. There is a complex bifurcation stenosis involving the LAD and third diagonal branch which is fairly large. Medina class 1,1,0.  There is severe disease in the distal LCx Moderately elevated LVEDP 28 mm Hg  Plan: Currently the patient is pain free and hemodynamically stable. Ecg changes have resolved. He has CKD with baseline creatinine 2.0. I felt it was best at this point to stabilize the patient medically and consider options for revascularization. Will resume IV heparin  and start IV Ntg. Needs some diuresis. Optimize GDMT. Monitor renal function closely and assess LV function with  Echo. Options include complex LAD/diagonal bifurcation stenting vs CABG. Will discuss with heart team in am.  Findings Coronary Findings Diagnostic  Dominance: Right  Left Anterior Descending Mid LAD  lesion is 85% stenosed.  Third Diagonal Branch 3rd Diag lesion is 80% stenosed.  Left Circumflex Mid Cx to Dist Cx lesion is 90% stenosed.  Second Obtuse Marginal Branch Vessel is small in size.  Third Obtuse Marginal Branch 3rd Mrg lesion is 100% stenosed.  Right Coronary Artery Anterior take off Prox RCA-1 lesion is 45% stenosed. Prox RCA-2 lesion is 20% stenosed.  Intervention  No interventions have been documented.     ECHOCARDIOGRAM  ECHOCARDIOGRAM COMPLETE 08/22/2024  Narrative ECHOCARDIOGRAM REPORT    Patient Name:   Adam Chung Date of Exam: 08/22/2024 Medical Rec #:  [id number]         Height:       71.0 in Accession #:    [id number]        Weight:       188.1 lb Date of Birth:  Apr 26, 1951         BSA:          2.054 m Patient Age:    73 years          BP:           134/69 mmHg Patient Gender: M                 HR:           82 bpm. Exam Location:  Inpatient  Procedure: 2D Echo, Cardiac Doppler and Color Doppler (Both Spectral and Color Flow Doppler were utilized during procedure).  Indications:    Chest Pain R07.9  History:        Patient has prior history of Echocardiogram examinations, most recent 09/21/2021. CAD; Risk Factors:Diabetes.  Sonographer:    Jayson Gaskins Referring Phys: [phone] PETER M SWAZILAND  IMPRESSIONS   1. Akinesis of the distal septum, distal inferior wall and apex with overall mild LV dysfunction; cannot R/O apical thrombus; suggest cardiac MRI or repeat limited echo with contrast to further assess. 2. Left ventricular ejection fraction, by estimation, is 40 to 45%. The left ventricle has mildly decreased function. The left ventricle demonstrates regional wall motion abnormalities (see scoring diagram/findings for description). There is mild left ventricular hypertrophy of the basal-septal segment. Left ventricular diastolic parameters are consistent with Grade I diastolic dysfunction (impaired relaxation). 3. Right ventricular  systolic function is normal. The right ventricular size is normal. 4. The mitral valve is normal in structure. Trivial mitral valve regurgitation. No evidence of mitral stenosis. 5. The aortic valve is tricuspid. Aortic valve regurgitation is not visualized. No aortic stenosis is present.  FINDINGS Left Ventricle: Left ventricular ejection fraction, by estimation, is 40 to 45%. The left ventricle has mildly decreased function. The left ventricle demonstrates regional wall motion abnormalities. The left ventricular internal cavity size was normal in size. There is mild left ventricular hypertrophy of the basal-septal segment. Left ventricular diastolic parameters are consistent with Grade I diastolic dysfunction (impaired relaxation).  Right Ventricle: The right ventricular size is normal. Right ventricular systolic function is normal.  Left Atrium: Left atrial size was normal in size.  Right Atrium: Right atrial size was normal in size.  Pericardium: There is no evidence of pericardial effusion.  Mitral Valve: The mitral valve is normal in structure. Trivial mitral valve regurgitation. No evidence of mitral valve stenosis.  Tricuspid Valve: The tricuspid valve is normal  in structure. Tricuspid valve regurgitation is trivial. No evidence of tricuspid stenosis.  Aortic Valve: The aortic valve is tricuspid. Aortic valve regurgitation is not visualized. No aortic stenosis is present. Aortic valve mean gradient measures 4.0 mmHg. Aortic valve peak gradient measures 4.8 mmHg. Aortic valve area, by VTI measures 3.25 cm.  Pulmonic Valve: The pulmonic valve was not well visualized. Pulmonic valve regurgitation is not visualized. No evidence of pulmonic stenosis.  Aorta: The aortic root is normal in size and structure.  Venous: The inferior vena cava was not well visualized.  IAS/Shunts: The interatrial septum was not well visualized.  Additional Comments: Akinesis of the distal septum, distal  inferior wall and apex with overall mild LV dysfunction; cannot R/O apical thrombus; suggest cardiac MRI or repeat limited echo with contrast to further assess.   LEFT VENTRICLE PLAX 2D LVIDd:         4.00 cm   Diastology LVIDs:         3.00 cm   LV e' medial:    6.31 cm/s LV PW:         0.60 cm   LV E/e' medial:  8.2 LV IVS:        0.70 cm   LV e' lateral:   7.18 cm/s LVOT diam:     2.00 cm   LV E/e' lateral: 7.2 LV SV:         59 LV SV Index:   29 LVOT Area:     3.14 cm   RIGHT VENTRICLE RV S prime:     13.50 cm/s TAPSE (M-mode): 2.0 cm  LEFT ATRIUM             Index        RIGHT ATRIUM          Index LA Vol (A2C):   38.4 ml 18.69 ml/m  RA Area:     7.61 cm LA Vol (A4C):   34.6 ml 16.84 ml/m  RA Volume:   14.10 ml 6.86 ml/m LA Biplane Vol: 36.9 ml 17.96 ml/m AORTIC VALVE AV Area (Vmax):    2.85 cm AV Area (Vmean):   2.46 cm AV Area (VTI):     3.25 cm AV Vmax:           110.00 cm/s AV Vmean:          92.900 cm/s AV VTI:            0.182 m AV Peak Grad:      4.8 mmHg AV Mean Grad:      4.0 mmHg LVOT Vmax:         99.80 cm/s LVOT Vmean:        72.700 cm/s LVOT VTI:          0.188 m LVOT/AV VTI ratio: 1.03  AORTA Ao Root diam: 3.40 cm Ao Asc diam:  3.30 cm  MITRAL VALVE MV Area (PHT): 2.48 cm     SHUNTS MV Decel Time: 306 msec     Systemic VTI:  0.19 m MV E velocity: 51.90 cm/s   Systemic Diam: 2.00 cm MV A velocity: 115.00 cm/s MV E/A ratio:  0.45  Redell Shallow MD Electronically signed by Redell Shallow MD Signature Date/Time: 08/22/2024/1:00:39 PM    Final   TEE  ECHO INTRAOPERATIVE TEE 08/23/2024  Narrative *INTRAOPERATIVE TRANSESOPHAGEAL REPORT *    Patient Name:   HERSHALL BENKERT Date of Exam: 08/23/2024 Medical Rec #:  [id number]  Height:       71.0 in Accession #:    [id number]        Weight:       188.1 lb Date of Birth:  14-Mar-1951         BSA:          2.05 m Patient Age:    73 years          BP:           116/59  mmHg Patient Gender: M                 HR:           72 bpm. Exam Location:  Anesthesiology  Transesophogeal exam was perform intraoperatively during surgical procedure. Patient was closely monitored under general anesthesia during the entirety of examination.  Indications:     CABG Performing Phys: [id number] DORISE MARLA FELLERS Diagnosing Phys: Kelly Mace MD  Complications: No known complications during this procedure. POST-OP IMPRESSIONS Limited Post-CPB exam: The patient separated easily from CPB  _ Left Ventricle: The left ventricular fuction is hyperdynamic, and is essentially unchanged from pre-bypass images, Overall EF 70%. The hypokinesis of the apex appears improved post -bypass. _ Right Ventricle: The right ventricular appears normal, unchanged from pre-bypass images. _ Aortic Valve: The aortic valve function remains normal, unchanged from pre-bypass images. _ Mitral Valve: The mitral valve function appears normal, unchanged from pre-bypass images. There is trivial mitral regurgitation. _ Tricuspid Valve: The tricuspid valve appears normal, unchanged from pre-bypass images. There is mild tricuspid regurgitation.  PRE-OP FINDINGS Left Ventricle: The left ventricle has low normal systolic function, with an ejection fraction of 50-55%, 52%. The cavity size was normal. Moderate hypokinesis of the left ventricular, entire apical segment. There is no left ventricular hypertrophy. Left ventricular diastolic function was not evaluated.  Right Ventricle: The right ventricle has normal systolic function. The cavity was normal. There is no increase in right ventricular wall thickness. Catheter present in the right ventricle.  Left Atrium: Left atrial size was normal in size. No left atrial/left atrial appendage thrombus was detected. Left atrial appendage velocity is reduced at slightly less than 40 cm/s.  Right Atrium: Right atrial size was normal in size. Catheter present in the  right atrium.  Interatrial Septum: No atrial level shunt detected by color flow Doppler. There is right bowing of the interatrial septum, suggestive of elevated left atrial pressure. There is no evidence of a patent foramen ovale.  Pericardium: There is no evidence of pericardial effusion.  Mitral Valve: The mitral valve is normal in structure. Mitral valve regurgitation is trivial by color flow Doppler. There is no evidence of mitral valve vegetation. Pulmonary venous flow is normal. There is no evidence of mitral stenosis.  Tricuspid Valve: The tricuspid valve was normal in structure. Tricuspid valve regurgitation was not visualized by color flow Doppler. No evidence of tricuspid stenosis is present. There is no evidence of tricuspid valve vegetation.  Aortic Valve: The aortic valve is tricuspid. Aortic valve regurgitation was not visualized by color flow Doppler. There is no stenosis of the aortic valvewith peak gradient 4 mmHg, mean gradient 2 mmHg. There is no evidence of aortic valve vegetation.  Pulmonic Valve: The pulmonic valve was normal in structure, with normal leaflet mobility. No evidence of pulmonic stenosis. Pulmonic valve regurgitation is trivial by color flow Doppler, around the PA catheter.   Aorta: The aortic root and ascending aorta are normal in size and structure. The  aortic arch was not well visualized. There is evidence of plaque in the descending aorta; Grade I, measuring 1-74mm in size.  Pulmonary Artery: Norva Purl catheter present on the right. The pulmonary artery is of normal size.  Venous: The inferior vena cava is normal in size with less than 50% respiratory variability, suggesting right atrial pressure of 8 mmHg.  Shunts: There is no evidence of an atrial septal defect.  +--------------+-------++ LEFT VENTRICLE        +--------------+-------++ PLAX 2D               +--------------+-------++ LVIDd:        3.93  cm +--------------+-------++ LVIDs:        2.50 cm +--------------+-------++ LV PW:        0.37 cm +--------------+-------++ LV SV:        45 ml   +--------------+-------++ LV SV Index:  21.62   +--------------+-------++                       +--------------+-------++  +-------------+-----------++ AORTIC VALVE             +-------------+-----------++ AV Vmax:     94.90 cm/s  +-------------+-----------++ AV Vmean:    64.900 cm/s +-------------+-----------++ AV VTI:      0.195 m     +-------------+-----------++ AV Peak Grad:3.6 mmHg    +-------------+-----------++ AV Mean Grad:2.0 mmHg    +-------------+-----------++  +-------------+---------++ MITRAL VALVE           +-------------+---------++ MV Peak grad:1.1 mmHg  +-------------+---------++ MV Mean grad:1.0 mmHg  +-------------+---------++ MV Vmax:     0.52 m/s  +-------------+---------++ MV Vmean:    38.7 cm/s +-------------+---------++ MV VTI:      0.20 m    +-------------+---------++   Kelly Mace MD Electronically signed by Kelly Mace MD Signature Date/Time: 08/23/2024/2:24:21 PM    Final        ______________________________________________________________________________________________      Risk Assessment/Calculations           Physical Exam VS:  BP 110/70 (BP Location: Left Arm, Patient Position: Sitting, Cuff Size: Normal)   Pulse 92   Ht 5' 11 (1.803 m)   Wt 182 lb 6.4 oz (82.7 kg)   SpO2 96%   BMI 25.44 kg/m        Wt Readings from Last 3 Encounters:  09/12/24 182 lb 6.4 oz (82.7 kg)  09/05/24 190 lb (86.2 kg)  08/28/24 186 lb 12.8 oz (84.7 kg)    GEN: Well nourished, well developed in no acute distress. Sitting comfortably on the exam table  NECK: No JVD  CARDIAC:  RRR, no murmurs, rubs, gallops. Radial pulses 2+ bilaterally  RESPIRATORY:  Clear to auscultation without rales, wheezing or rhonchi.  Normal WOB on room air   ABDOMEN: Soft, non-tender, non-distended EXTREMITIES:  No edema in BLE; No deformity   ASSESSMENT AND PLAN  CAD s/p CABG - Patient was admitted with a STEMI on 9/23.  Cath showed two-vessel obstructive CAD with a complex bifurcation stenosis involving the LAD and third diagonal branch, severe disease in the distal left circumflex.  Underwent two-vessel CABG with LIMA-LAD and SVG-diagonal on 9/25. - Echocardiogram prior to surgery showed akinesis of the distal septum, distal inferior wall, apex with EF 40-45%.   - Intraoperative TEE showed EF 70%.  Hypokinesis of the apex improved post bypass.  RV normal.  No significant valvular abnormalities - Plan to repeat echo in 3 months to reassess EF  -  Patient tells me he has been doing very well since his surgery.  He is working to increase his physical activity.  Has some soreness near his sternum but no chest pain otherwise.  Breathing stable - Continue DAPT with aspirin  81 mg daily and Plavix  75 mg daily - Hemoglobin 9.2 on 9/29.  Ordered repeat CBC on DAPT.  Patient denies bleeding or bruising on DAPT - Continue metoprolol  tartrate 25 mg twice daily - Continue Lipitor 80 mg daily  Hypertension - BP well-controlled.  Patient denies dizziness - Continue metoprolol  tartrate 25 mg twice daily  Hyperlipidemia - Lipid panel from 06/2024 showed LDL 119, HDL 35, triglycerides 151, total cholesterol 184  - Continue Lipitor 80 mg daily - new during recent admission  - Needs repeat LFTs and lipids in 2-3 months- he has follow up in 3 months with Me. I will check at that time. Instructed patient to fast prior to this appointment   CKD stage IIIb - Baseline creatinine around 2.2-2.5  - Ordered BMP for monitoring   Insulin -dependent type 2 diabetes - Followed by primary care provider.  On insulin , Ozempic - A1c 6.5 on 06/29/2024    Cardiac Rehabilitation Eligibility Assessment  The patient is ready to start cardiac  rehabilitation pending clearance from the cardiac surgeon.     Dispo: Follow up in 3 months with me   Signed, Rollo FABIENE Louder, PA-C

## 2024-09-04 ENCOUNTER — Other Ambulatory Visit: Payer: Self-pay | Admitting: Surgery

## 2024-09-04 DIAGNOSIS — Z951 Presence of aortocoronary bypass graft: Secondary | ICD-10-CM

## 2024-09-04 NOTE — Progress Notes (Signed)
 Patient picked up medication

## 2024-09-05 ENCOUNTER — Ambulatory Visit (HOSPITAL_COMMUNITY)
Admission: RE | Admit: 2024-09-05 | Discharge: 2024-09-05 | Disposition: A | Discharge status: Home / Self Care | Source: Ambulatory Visit | Attending: Cardiology | Admitting: Cardiology

## 2024-09-05 ENCOUNTER — Ambulatory Visit

## 2024-09-05 VITALS — BP 138/82 | HR 81 | Resp 18 | Ht 71.0 in | Wt 190.0 lb

## 2024-09-05 DIAGNOSIS — Z951 Presence of aortocoronary bypass graft: Secondary | ICD-10-CM | POA: Diagnosis present

## 2024-09-05 NOTE — Progress Notes (Signed)
 [address] [place] [address]             [phone]       HPI:  Adam Chung is a 73 year old man with medical history of hypertension, STEMI, GERD, type 2 diabetes, bladder cancer, and chronic kidney disease stage 3b who returns for routine postoperative follow-up having undergone Coronary artery bypass grafting x 2 (Left internal mammary artery graft to the LAD, SVG to diagonal 3) and Endoscopic vein harvest from the right leg on 08/23/2024 with Dr. Lucas.  The patient's early postoperative recovery while in the hospital was notable for his chorionic kidney disease stage 3b remaining stable with creatinine of 2.19. After administration of lasix  his creatinine increased so this was discontinued.  He remained in NSR.  His blood sugar was monitored and insulin  was adjusted accordingly.  Prior to discharge creatinine trended back toward baseline. He was started on plavix  and aspirin . He was stable for discharge home on 08/28/2024.  Since hospital discharge the patient reports that he has been doing good.  His incision have been healing well.  He has not needed to take anything for pain. He finds that 81 mg aspirin  is enough to control the pain.  He was been walking daily and doing some light housework.  He denies chest pain, shortness of breath and lower leg swelling.    Allergies as of 09/05/2024       Reactions   Apple Juice Anaphylaxis   Benicar  [olmesartan ] Other (See Comments)   Acute kidney injury   Penicillins Swelling   Angioedema         Medication List        Accurate as of September 05, 2024  1:41 PM. If you have any questions, ask your nurse or doctor.          STOP taking these medications    Levemir  FlexTouch 100 UNIT/ML FlexTouch Pen Generic drug: insulin  detemir Stopped by: Manuelita CHRISTELLA Rough       TAKE these medications    aspirin  EC 81 MG tablet Take 1 tablet (81 mg total) by mouth daily. Swallow whole.    atorvastatin  80 MG tablet Commonly known as: LIPITOR Take 1 tablet (80 mg total) by mouth daily.   clopidogrel  75 MG tablet Commonly known as: PLAVIX  Take 1 tablet (75 mg total) by mouth daily.   metoprolol  tartrate 25 MG tablet Commonly known as: LOPRESSOR  Take 1 tablet (25 mg total) by mouth 2 (two) times daily.   OVER THE COUNTER MEDICATION Take 1 tablet by mouth daily as needed (allergies, eye irritation). OTC unknown non-drowsy allergy relief, blue tablet   Ozempic (0.25 or 0.5 MG/DOSE) 2 MG/3ML Sopn Generic drug: Semaglutide(0.25 or 0.5MG /DOS) Inject 0.5 mg into the skin every Monday.   traMADol  50 MG tablet Commonly known as: ULTRAM  Take 1 tablet (50 mg total) by mouth every 12 (twelve) hours as needed for moderate pain (pain score 4-6).   Tresiba FlexTouch 100 UNIT/ML FlexTouch Pen Generic drug: insulin  degludec Inject 32 Units into the skin daily.   Vitamin D3 50 MCG (2000 UT) capsule Take 8,000 Units by mouth daily.         ROS Review of Systems  Constitutional: Negative.  Negative for malaise/fatigue.  Respiratory: Negative.  Negative for cough and wheezing.   Cardiovascular: Negative.  Negative for chest pain and leg swelling.      BP 138/82 (BP Location: Right  Arm)   Pulse 81   Resp 18   Ht 5' 11 (1.803 m)   Wt 190 lb (86.2 kg)   SpO2 95%   BMI 26.50 kg/m    Physical Exam Constitutional:      Appearance: Normal appearance.  HENT:     Head: Normocephalic and atraumatic.  Cardiovascular:     Rate and Rhythm: Normal rate and regular rhythm.     Heart sounds: Normal heart sounds, S1 normal and S2 normal.  Pulmonary:     Effort: Pulmonary effort is normal.     Breath sounds: Normal breath sounds.  Skin:    General: Skin is warm and dry.      Neurological:     General: No focal deficit present.     Mental Status: He is alert and oriented to person, place, and time.      Imaging: EXAM: 2 VIEW(S) XRAY OF THE CHEST 09/05/2024  12:58:57 PM   COMPARISON: 08/27/2024.   CLINICAL HISTORY: CABG.   FINDINGS:   LUNGS AND PLEURA: Small bilateral pleural effusions, left greater than right. Stable linear scarring or atelectasis in left lower lung. Previous right pneumothorax no longer visualized.   HEART AND MEDIASTINUM: CABG markers noted.   BONES AND SOFT TISSUES: No acute osseous abnormality.   IMPRESSION: 1. Small bilateral pleural effusions, left greater than right. 2. Stable linear scarring or atelectasis in the left lower lung.   Electronically signed by: Ryan Salvage MD 09/05/2024 01:29 PM EDT RP Workstation: [id number]   Assessment/Plan:  S/P CABG x 2 -We reviewed todays chest x-ray.  Right pneumothorax has resolved.  Still has small bilateral pleural effusions.  He denies symptoms of cough, shortness of breath and chest pain.  Continue to use incentive spirometer  -Continue to keep incisions clean and dry -Continue to increase activity/exercise as tolerated. Continue with sternal precautions and lifting restrictions -Follow up with cardiology scheduled for next week -Follow up in 3 weeks with [facility] with chest xray. Will discuss driving and clearance for cardiac rehab.   Manuelita HERO Haralson, PA-C 1:41 PM 09/05/24

## 2024-09-05 NOTE — Patient Instructions (Signed)
-  Follow up in 3 weeks with our clinic with chest xray  You may continue to gradually increase your physical activity as tolerated.  Refrain from any heavy lifting or strenuous use of your arms and shoulders until at least 6 weeks from the time of your surgery, and avoid activities that cause increased pain in your chest on the side of your surgical incision.  Otherwise you may continue to increase activities without any particular limitations.  Increase the intensity and duration of physical activity gradually.

## 2024-09-12 ENCOUNTER — Ambulatory Visit: Attending: Cardiovascular Disease | Admitting: Cardiology

## 2024-09-12 ENCOUNTER — Encounter: Payer: Self-pay | Admitting: Cardiology

## 2024-09-12 VITALS — BP 110/70 | HR 92 | Ht 71.0 in | Wt 182.4 lb

## 2024-09-12 DIAGNOSIS — E782 Mixed hyperlipidemia: Secondary | ICD-10-CM

## 2024-09-12 DIAGNOSIS — I10 Essential (primary) hypertension: Secondary | ICD-10-CM

## 2024-09-12 DIAGNOSIS — N1832 Chronic kidney disease, stage 3b: Secondary | ICD-10-CM | POA: Diagnosis not present

## 2024-09-12 DIAGNOSIS — Z951 Presence of aortocoronary bypass graft: Secondary | ICD-10-CM

## 2024-09-12 NOTE — Patient Instructions (Addendum)
 Medication Instructions:  Your physician recommends that you continue on your current medications as directed. Please refer to the Current Medication list given to you today.  *If you need a refill on your cardiac medications before your next appointment, please call your pharmacy*  Lab Work: Today- CBC, BMET If you have labs (blood work) drawn today and your tests are completely normal, you will receive your results only by: MyChart Message (if you have MyChart) OR A paper copy in the mail If you have any lab test that is abnormal or we need to change your treatment, we will call you to review the results.  Testing/Procedures: None ordered  Follow-Up: At [facility], you and your health needs are our priority.  As part of our continuing mission to provide you with exceptional heart care, our providers are all part of one team.  This team includes your primary Cardiologist (physician) and Advanced Practice Providers or APPs (Physician Assistants and Nurse Practitioners) who all work together to provide you with the care you need, when you need it.  Your next appointment:   3 month(s)  Provider:   Rollo Louder, PA-C            Please fast before  coming to your next appointment

## 2024-09-13 ENCOUNTER — Ambulatory Visit: Payer: Self-pay | Admitting: Cardiology

## 2024-09-13 LAB — BASIC METABOLIC PANEL WITH GFR
BUN/Creatinine Ratio: 16 (ref 10–24)
BUN: 37 mg/dL — AB (ref 8–27)
CO2: 19 mmol/L — AB (ref 20–29)
Calcium: 9.4 mg/dL (ref 8.6–10.2)
Chloride: 105 mmol/L (ref 96–106)
Creatinine, Ser: 2.3 mg/dL — AB (ref 0.76–1.27)
Glucose: 98 mg/dL (ref 70–99)
Potassium: 5.1 mmol/L (ref 3.5–5.2)
Sodium: 140 mmol/L (ref 134–144)
eGFR: 29 mL/min/1.73 — AB (ref >59)

## 2024-09-13 LAB — CBC
Hematocrit: 33 % — ABNORMAL LOW (ref 37.5–51.0)
Hemoglobin: 10.7 g/dL — ABNORMAL LOW (ref 13.0–17.7)
MCH: 30 pg (ref 26.6–33.0)
MCHC: 32.4 g/dL (ref 31.5–35.7)
MCV: 92 fL (ref 79–97)
Platelets: 368 x10E3/uL (ref 150–450)
RBC: 3.57 x10E6/uL — ABNORMAL LOW (ref 4.14–5.80)
RDW: 12.8 % (ref 11.6–15.4)
WBC: 8.3 x10E3/uL (ref 3.4–10.8)

## 2024-09-21 ENCOUNTER — Other Ambulatory Visit: Payer: Self-pay | Admitting: Surgery

## 2024-09-21 DIAGNOSIS — Z951 Presence of aortocoronary bypass graft: Secondary | ICD-10-CM

## 2024-09-26 ENCOUNTER — Ambulatory Visit: Payer: Self-pay

## 2024-09-26 ENCOUNTER — Ambulatory Visit (HOSPITAL_COMMUNITY)
Admission: RE | Admit: 2024-09-26 | Discharge: 2024-09-26 | Disposition: A | Discharge status: Home / Self Care | Source: Ambulatory Visit | Attending: Cardiology | Admitting: Cardiology

## 2024-09-26 VITALS — BP 130/84 | HR 84 | Resp 18 | Ht 71.0 in

## 2024-09-26 DIAGNOSIS — Z951 Presence of aortocoronary bypass graft: Secondary | ICD-10-CM

## 2024-09-26 NOTE — Patient Instructions (Signed)

## 2024-09-26 NOTE — Progress Notes (Signed)
 [address] [place] [address]             [phone]       HPI:  Adam Chung is a 73 year old man with medical history of hypertension, STEMI, GERD, type 2 diabetes, bladder cancer, and chronic kidney disease stage 3b who returns for routine postoperative follow-up having undergone Coronary artery bypass grafting x 2 (Left internal mammary artery graft to the LAD, SVG to diagonal 3) and Endoscopic vein harvest from the right leg on 08/23/2024 with Dr. Lucas.  The patient's early postoperative recovery while in the hospital was notable for his chorionic kidney disease stage 3b remaining stable with creatinine of 2.19. After administration of lasix  his creatinine increased so this was discontinued.  He remained in NSR.  His blood sugar was monitored and insulin  was adjusted accordingly.  Prior to discharge creatinine trended back toward baseline. He was started on plavix  and aspirin . He was stable for discharge home on 08/28/2024.  He presents to the clinic today for continued follow up. He has been doing very well post surgery.  His incisions have been healing well.  He still notes some soreness with certain movements but is not requiring any medications for pain.  He has been using resistance bands for strength and has been up walking daily.  He denies shortness of breath, chest pain, lower leg swelling.    Allergies as of 09/26/2024       Reactions   Apple Anaphylaxis   Raw apples   Benicar  [olmesartan ] Other (See Comments)   Acute kidney injury   Penicillins Swelling   Angioedema         Medication List        Accurate as of September 26, 2024  9:20 AM. If you have any questions, ask your nurse or doctor.          aspirin  EC 81 MG tablet Take 1 tablet (81 mg total) by mouth daily. Swallow whole.   atorvastatin  80 MG tablet Commonly known as: LIPITOR Take 1 tablet (80 mg total) by mouth daily.   clopidogrel  75 MG tablet Commonly  known as: PLAVIX  Take 1 tablet (75 mg total) by mouth daily.   metoprolol  tartrate 25 MG tablet Commonly known as: LOPRESSOR  Take 1 tablet (25 mg total) by mouth 2 (two) times daily.   OVER THE COUNTER MEDICATION Take 1 tablet by mouth daily as needed (allergies, eye irritation). OTC unknown non-drowsy allergy relief, blue tablet   Ozempic (0.25 or 0.5 MG/DOSE) 2 MG/3ML Sopn Generic drug: Semaglutide(0.25 or 0.5MG /DOS) Inject 0.5 mg into the skin every Monday.   traMADol  50 MG tablet Commonly known as: ULTRAM  Take 1 tablet (50 mg total) by mouth every 12 (twelve) hours as needed for moderate pain (pain score 4-6).   Tresiba FlexTouch 100 UNIT/ML FlexTouch Pen Generic drug: insulin  degludec Inject 32 Units into the skin daily.   Vitamin D3 50 MCG (2000 UT) capsule Take 8,000 Units by mouth daily.         ROS Review of Systems  Constitutional: Negative.  Negative for malaise/fatigue.  Respiratory: Negative.  Negative for cough and wheezing.   Cardiovascular: Negative.  Negative for chest pain and leg swelling.      BP 130/84 (BP Location: Right Arm)   Pulse 84   Resp 18   Ht 5' 11 (1.803 m)   SpO2 97%   BMI 25.44 kg/m  Physical Exam Constitutional:      Appearance: Normal appearance.  HENT:     Head: Normocephalic and atraumatic.  Cardiovascular:     Rate and Rhythm: Normal rate and regular rhythm.     Heart sounds: Normal heart sounds, S1 normal and S2 normal.  Pulmonary:     Effort: Pulmonary effort is normal.     Breath sounds: Normal breath sounds.  Skin:    General: Skin is warm and dry.      Neurological:     General: No focal deficit present.     Mental Status: He is alert and oriented to person, place, and time.      Imaging: EXAM: 2 VIEW(S) XRAY OF THE CHEST 09/26/2024 09:00:24 AM   COMPARISON: 09/05/2024.   CLINICAL HISTORY: Prior CABG.   FINDINGS:   LUNGS AND PLEURA: Small left pleural effusion, stable compared to prior.  Resolution of right pleural effusion. Left basilar platelike opacity, likely atelectasis. No pulmonary edema. No pneumothorax.   HEART AND MEDIASTINUM: No acute abnormality of the cardiac and mediastinal silhouettes.   BONES AND SOFT TISSUES: Median sternotomy wires noted. Thoracic spondylosis. Cholecystectomy clips noted. No acute osseous abnormality.   IMPRESSION: 1. Small left pleural effusion, unchanged from prior study. 2. Resolution of right pleural effusion. 3. Left basilar atelectasis at the lung base. 4. Thoracic spondylosis.   Electronically signed by: Ryan Salvage MD 09/26/2024 09:36 AM EDT RP Workstation: [id number]   Assessment/Plan:  S/P CABG x 2 -We reviewed today's chest x ray. Small left pleural effusion with left basilar atelectasis. Continues to be asymptomatic at this time. He should continue to use incentive spirometer  -He can increase his activity/exercise as tolerated -We discussed driving and he is able to start since he is not requiring any narcotics for pain.  First time driving should be a short distance in the daytime we can slowly increase from there -Discussed participation in cardiac rehab and he is cleared from a surgical standpoint at this time.  - Discussed continuance of sternal precautions until full 6 weeks from surgery.  Continue to avoid heavy lifting. - Incision sites healing appropriately.  Continue to keep them clean and dry -He has been seen by cardiology and he is to continue aspirin , metoprolol , atorvastatin , and Plavix  - Continue follow-up with PCP for type II diabetes -Follow up with [facility] as needed  Manuelita CHRISTELLA Rough, PA-C 9:20 AM 09/26/24

## 2024-09-27 ENCOUNTER — Telehealth (HOSPITAL_COMMUNITY): Payer: Self-pay | Admitting: *Deleted

## 2024-09-27 NOTE — Telephone Encounter (Signed)
 Chart Review. Patient cleared by [facility] cardiology and [facility] to participate in cardiac rehab. Patient is ready to be scheduled.Hadassah Elpidio Quan RN BSN

## 2024-09-28 ENCOUNTER — Telehealth (HOSPITAL_COMMUNITY): Payer: Self-pay

## 2024-09-28 NOTE — Telephone Encounter (Signed)
 Pt insurance is active and benefits verified through [facility] [employer]. Co-pay $0, DED $0/$0 met, out of pocket $3,900/$547.76 met, co-insurance 0%. No pre-authorization required. 09/28/2024 @ 3:20pm, spoke with Merlynn, REF# [id number].  TCR/ICR? ICR Visit(date of service)limitation? No Can multiple codes be used on the same date of service/visit?(IF ITS A LIMIT) N/A  Is this a lifetime maximum or an annual maximum? Annual Has the member used any of these services to date? No Is there a time limit (weeks/months) on start of program and/or program completion? No

## 2024-10-02 ENCOUNTER — Encounter (HOSPITAL_COMMUNITY): Payer: Self-pay

## 2024-10-02 ENCOUNTER — Telehealth (HOSPITAL_COMMUNITY): Payer: Self-pay

## 2024-10-02 NOTE — Telephone Encounter (Signed)
 Attempted to call patient to schedule cardiac rehab- no answer, unable to leave message. Mailed letter.

## 2024-10-17 ENCOUNTER — Telehealth (HOSPITAL_COMMUNITY): Payer: Self-pay

## 2024-10-17 NOTE — Telephone Encounter (Signed)
 Attempted f/u call to schedule cardiac rehab- no answer, unable to leave message.  Closing referral.

## 2024-10-21 ENCOUNTER — Other Ambulatory Visit: Payer: Self-pay | Admitting: Physician Assistant

## 2024-11-16 ENCOUNTER — Other Ambulatory Visit: Payer: Self-pay | Admitting: Physician Assistant

## 2024-11-23 ENCOUNTER — Other Ambulatory Visit: Payer: Self-pay | Admitting: Physician Assistant

## 2024-12-10 NOTE — Progress Notes (Unsigned)
 " Cardiology Office Note   Date:  12/10/2024  ID:  Adam, Chung 05-05-1951, MRN [id number] PCP: Cleotilde Oneil FALCON, MD  [facility] Providers Cardiologist:  Newman JINNY Lawrence, MD   History of Present Illness Adam Chung is a 74 y.o. 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  Patient presents today for a post STEMI/CABG follow-up appointment.   Patient presented on 08/21/2024 complaining of pressure-like, nonradiating, nonexertional chest pain with associated left arm numbness.  He called EMS.  Upon their arrival first EKG showed ST elevation in V1-V3 with reciprocal ST depression.  Given 324 mg aspirin  and his chest pain resolved.  Repeat EKG showed improvement in ST elevation.  He underwent cardiac catheterization 08/21/2024 and was found to have two-vessel obstructive CAD with a complex bifurcation stenosis involving the LAD and third diagonal branch.  Patient was chest pain-free and hemodynamically stable, EKG changes had resolved.  Options for management included complex LAD/diagonal bifurcation stenting versus CABG.  He underwent echocardiogram 08/18/2024 that showed EF 40-45% with akinesis of the distal septum, distal inferior wall and apex with overall mild LV dysfunction.  Could not rule out apical thrombus.  RV systolic function normal, RV size normal.  No significant valvular abnormalities.   CT surgery was consulted.  On 9/25 patient was taken to the OR and underwent two-vessel CABG with LIMA-LAD and SVG-diagonal.  Postsurgery, creatinine remained stable at 2.19.  Chest tube was removed 08/26/2024.  He remained in normal sinus rhythm.  He was started on Plavix  and aspirin  decreased to 81 mg daily.  I saw him in clinic on 09/12/24. At that time, patient was doing well. Cleared by CT surgery on  10/29  CAD s/p CABG - Patient was admitted with a STEMI on 9/23.  Cath showed two-vessel obstructive CAD with a complex bifurcation stenosis involving the LAD and third diagonal branch, severe disease in the distal left circumflex.  Underwent two-vessel CABG with LIMA-LAD and SVG-diagonal on 9/25. - Echocardiogram prior to surgery showed akinesis of the distal septum, distal inferior wall, apex with EF 40-45%. Intraoperative TEE showed EF 70%.  Hypokinesis of the apex improved post bypass.  RV normal.  No significant valvular abnormalities - Plan to repeat echo in 3 months to reassess EF  - Patient tells me he has been doing very well since his surgery.  He is working to increase his physical activity.  Has some soreness near his sternum but no chest pain otherwise.  Breathing stable - Ordered echocardiogram ***  - Continue DAPT with aspirin  81 mg daily and Plavix  75 mg daily - Continue metoprolol  tartrate 25 mg twice daily - Continue Lipitor 80 mg daily   Hypertension - BP well-controlled.  Patient denies dizziness - Continue metoprolol  tartrate 25 mg twice daily   Hyperlipidemia - Lipid panel from 09/2024 showed LDL 43, HDL 44, triglycerides 140, total cholesterol 115  - Continue lipitor 80 mg daily    CKD stage IIIb - Baseline creatinine around 2.2-2.5  - Creatinine 2.0 on 10/02/24    Insulin -dependent type 2 diabetes - Followed by primary care provider.  On insulin , Ozempic - A1c 6.5 on 06/29/2024  ROS: ***  Studies Reviewed      *** Risk Assessment/Calculations {Does this patient have ATRIAL FIBRILLATION?[phone]} No BP recorded.  {Refresh Note OR Click here to enter BP  :1}***  Physical Exam VS:  There were no vitals taken for this visit.       Wt Readings from Last 3 Encounters:  09/12/24 182 lb 6.4 oz (82.7 kg)  09/05/24 190 lb (86.2 kg)  08/28/24 186 lb 12.8 oz (84.7 kg)    GEN: Well nourished, well developed in no acute distress NECK: No JVD; No carotid  bruits CARDIAC: ***RRR, no murmurs, rubs, gallops RESPIRATORY:  Clear to auscultation without rales, wheezing or rhonchi  ABDOMEN: Soft, non-tender, non-distended EXTREMITIES:  No edema; No deformity   ASSESSMENT AND PLAN ***    {Are you ordering a CV Procedure (e.g. stress test, cath, DCCV, TEE, etc)?   Press F2        :[id number]}  Dispo: ***  Signed, Rollo FABIENE Louder, PA-C   "

## 2024-12-21 ENCOUNTER — Telehealth: Payer: Self-pay | Admitting: Cardiology

## 2024-12-21 NOTE — Telephone Encounter (Signed)
" °*  STAT* If patient is at the pharmacy, call can be transferred to refill team.   1. Which medications need to be refilled? (please list name of each medication and dose if known) clopidogrel  (PLAVIX ) 75 MG tablet    2. Would you like to learn more about the convenience, safety, & potential cost savings by using the [facility]? No      3. Are you open to using the [facility] (Type [facility]. No    4. Which pharmacy/location (including street and city if local pharmacy) is medication to be sent to? [facility] [address] [place] - [address]     5. Do they need a 30 day or 90 day supply? 30 day   Pt is out of medication  Pt was prescribed medication in hospital  "

## 2024-12-24 ENCOUNTER — Ambulatory Visit: Admitting: Cardiology

## 2024-12-25 MED ORDER — CLOPIDOGREL BISULFATE 75 MG PO TABS: 75.0000 mg | ORAL_TABLET | Freq: Every day | ORAL | 1 refills | Status: AC

## 2025-01-23 ENCOUNTER — Ambulatory Visit: Admitting: Cardiology
